# Patient Record
Sex: Female | Born: 1935 | Race: White | Hispanic: No | Smoking: Never smoker
Health system: Southern US, Community
[De-identification: ages and names within clinical notes are randomized; demographics above are authoritative.]

## PROBLEM LIST (undated history)

## (undated) DIAGNOSIS — S82209A Unspecified fracture of shaft of unspecified tibia, initial encounter for closed fracture: Secondary | ICD-10-CM

## (undated) DIAGNOSIS — I4510 Unspecified right bundle-branch block: Secondary | ICD-10-CM

## (undated) DIAGNOSIS — H3561 Retinal hemorrhage, right eye: Secondary | ICD-10-CM

## (undated) DIAGNOSIS — H269 Unspecified cataract: Secondary | ICD-10-CM

## (undated) DIAGNOSIS — N259 Disorder resulting from impaired renal tubular function, unspecified: Secondary | ICD-10-CM

## (undated) DIAGNOSIS — D510 Vitamin B12 deficiency anemia due to intrinsic factor deficiency: Secondary | ICD-10-CM

## (undated) DIAGNOSIS — E559 Vitamin D deficiency, unspecified: Secondary | ICD-10-CM

## (undated) DIAGNOSIS — E7800 Pure hypercholesterolemia, unspecified: Secondary | ICD-10-CM

## (undated) DIAGNOSIS — F03918 Unspecified dementia, unspecified severity, with other behavioral disturbance: Secondary | ICD-10-CM

## (undated) DIAGNOSIS — H9190 Unspecified hearing loss, unspecified ear: Secondary | ICD-10-CM

## (undated) DIAGNOSIS — K449 Diaphragmatic hernia without obstruction or gangrene: Secondary | ICD-10-CM

## (undated) DIAGNOSIS — K8050 Calculus of bile duct without cholangitis or cholecystitis without obstruction: Secondary | ICD-10-CM

## (undated) DIAGNOSIS — I872 Venous insufficiency (chronic) (peripheral): Secondary | ICD-10-CM

## (undated) DIAGNOSIS — I6350 Cerebral infarction due to unspecified occlusion or stenosis of unspecified cerebral artery: Secondary | ICD-10-CM

## (undated) DIAGNOSIS — N184 Chronic kidney disease, stage 4 (severe): Secondary | ICD-10-CM

## (undated) DIAGNOSIS — D631 Anemia in chronic kidney disease: Secondary | ICD-10-CM

## (undated) DIAGNOSIS — I10 Essential (primary) hypertension: Secondary | ICD-10-CM

## (undated) DIAGNOSIS — E875 Hyperkalemia: Secondary | ICD-10-CM

## (undated) DIAGNOSIS — F0391 Unspecified dementia with behavioral disturbance: Secondary | ICD-10-CM

## (undated) DIAGNOSIS — J309 Allergic rhinitis, unspecified: Secondary | ICD-10-CM

## (undated) DIAGNOSIS — Z7409 Other reduced mobility: Secondary | ICD-10-CM

## (undated) DIAGNOSIS — I679 Cerebrovascular disease, unspecified: Secondary | ICD-10-CM

## (undated) HISTORY — DX: Vitamin B12 deficiency anemia due to intrinsic factor deficiency: D51.0

## (undated) HISTORY — DX: Chronic kidney disease, stage 4 (severe): N18.4

## (undated) HISTORY — DX: Allergic rhinitis, unspecified: J30.9

## (undated) HISTORY — DX: Anemia in chronic kidney disease: N18.4

## (undated) HISTORY — PX: CAROTID ENDARTERECTOMY: SUR193

## (undated) HISTORY — DX: Venous insufficiency (chronic) (peripheral): I87.2

## (undated) HISTORY — DX: Cerebrovascular disease, unspecified: I67.9

## (undated) HISTORY — DX: Cerebral infarction due to unspecified occlusion or stenosis of unspecified cerebral artery: I63.50

## (undated) HISTORY — DX: Unspecified dementia, unspecified severity, with other behavioral disturbance: F03.918

## (undated) HISTORY — DX: Essential (primary) hypertension: I10

## (undated) HISTORY — DX: Unspecified hearing loss, unspecified ear: H91.90

## (undated) HISTORY — PX: CHOLECYSTECTOMY: SHX55

## (undated) HISTORY — DX: Vitamin D deficiency, unspecified: E55.9

## (undated) HISTORY — DX: Unspecified cataract: H26.9

## (undated) HISTORY — DX: Calculus of bile duct without cholangitis or cholecystitis without obstruction: K80.50

## (undated) HISTORY — DX: Anemia in chronic kidney disease: D63.1

## (undated) HISTORY — DX: Diaphragmatic hernia without obstruction or gangrene: K44.9

## (undated) HISTORY — DX: Unspecified fracture of shaft of unspecified tibia, initial encounter for closed fracture: S82.209A

## (undated) HISTORY — DX: Disorder resulting from impaired renal tubular function, unspecified: N25.9

## (undated) HISTORY — DX: Hyperkalemia: E87.5

## (undated) HISTORY — DX: Pure hypercholesterolemia, unspecified: E78.00

## (undated) HISTORY — DX: Retinal hemorrhage, right eye: H35.61

## (undated) HISTORY — DX: Unspecified dementia with behavioral disturbance: F03.91

---

## 2000-01-11 ENCOUNTER — Ambulatory Visit (HOSPITAL_COMMUNITY): Admission: RE | Admit: 2000-01-11 | Discharge: 2000-01-11 | Payer: Self-pay | Admitting: Pulmonary Disease

## 2000-01-11 ENCOUNTER — Encounter: Payer: Self-pay | Admitting: Pulmonary Disease

## 2003-07-15 ENCOUNTER — Emergency Department (HOSPITAL_COMMUNITY): Admission: EM | Admit: 2003-07-15 | Discharge: 2003-07-15 | Payer: Self-pay | Admitting: Emergency Medicine

## 2004-02-28 ENCOUNTER — Ambulatory Visit: Payer: Self-pay | Admitting: Pulmonary Disease

## 2004-03-28 ENCOUNTER — Ambulatory Visit: Payer: Self-pay | Admitting: Pulmonary Disease

## 2004-05-02 ENCOUNTER — Ambulatory Visit: Payer: Self-pay | Admitting: Pulmonary Disease

## 2004-05-30 ENCOUNTER — Ambulatory Visit: Payer: Self-pay | Admitting: Pulmonary Disease

## 2004-06-25 ENCOUNTER — Ambulatory Visit: Payer: Self-pay | Admitting: Pulmonary Disease

## 2004-06-28 ENCOUNTER — Ambulatory Visit: Payer: Self-pay | Admitting: Pulmonary Disease

## 2004-07-11 ENCOUNTER — Encounter (HOSPITAL_COMMUNITY): Admission: RE | Admit: 2004-07-11 | Discharge: 2004-07-13 | Payer: Self-pay | Admitting: Pulmonary Disease

## 2004-07-20 ENCOUNTER — Ambulatory Visit: Payer: Self-pay | Admitting: Pulmonary Disease

## 2004-07-30 ENCOUNTER — Ambulatory Visit: Payer: Self-pay | Admitting: Pulmonary Disease

## 2004-08-30 ENCOUNTER — Ambulatory Visit: Payer: Self-pay | Admitting: Pulmonary Disease

## 2004-10-01 ENCOUNTER — Ambulatory Visit: Payer: Self-pay | Admitting: Pulmonary Disease

## 2004-11-01 ENCOUNTER — Ambulatory Visit: Payer: Self-pay | Admitting: Pulmonary Disease

## 2004-12-06 ENCOUNTER — Ambulatory Visit: Payer: Self-pay | Admitting: Pulmonary Disease

## 2005-01-02 ENCOUNTER — Ambulatory Visit: Payer: Self-pay | Admitting: Pulmonary Disease

## 2005-01-30 ENCOUNTER — Ambulatory Visit: Payer: Self-pay | Admitting: Pulmonary Disease

## 2005-02-27 ENCOUNTER — Ambulatory Visit: Payer: Self-pay | Admitting: Pulmonary Disease

## 2005-04-03 ENCOUNTER — Ambulatory Visit: Payer: Self-pay | Admitting: Pulmonary Disease

## 2005-05-08 ENCOUNTER — Ambulatory Visit: Payer: Self-pay | Admitting: Pulmonary Disease

## 2005-06-05 ENCOUNTER — Ambulatory Visit: Payer: Self-pay | Admitting: Pulmonary Disease

## 2005-07-03 ENCOUNTER — Ambulatory Visit: Payer: Self-pay | Admitting: Pulmonary Disease

## 2005-08-07 ENCOUNTER — Ambulatory Visit: Payer: Self-pay | Admitting: Pulmonary Disease

## 2005-09-04 ENCOUNTER — Ambulatory Visit: Payer: Self-pay | Admitting: Pulmonary Disease

## 2005-10-02 ENCOUNTER — Ambulatory Visit: Payer: Self-pay | Admitting: Pulmonary Disease

## 2005-11-05 ENCOUNTER — Ambulatory Visit: Payer: Self-pay | Admitting: Pulmonary Disease

## 2005-12-02 ENCOUNTER — Ambulatory Visit: Payer: Self-pay | Admitting: Pulmonary Disease

## 2006-01-01 ENCOUNTER — Ambulatory Visit: Payer: Self-pay | Admitting: Pulmonary Disease

## 2006-01-29 ENCOUNTER — Ambulatory Visit: Payer: Self-pay | Admitting: Pulmonary Disease

## 2006-03-04 ENCOUNTER — Ambulatory Visit: Payer: Self-pay | Admitting: Pulmonary Disease

## 2006-04-03 ENCOUNTER — Ambulatory Visit: Payer: Self-pay | Admitting: Pulmonary Disease

## 2006-04-28 ENCOUNTER — Ambulatory Visit: Payer: Self-pay | Admitting: Pulmonary Disease

## 2006-06-03 ENCOUNTER — Ambulatory Visit: Payer: Self-pay | Admitting: Pulmonary Disease

## 2006-07-02 ENCOUNTER — Ambulatory Visit: Payer: Self-pay | Admitting: Pulmonary Disease

## 2006-07-02 LAB — CONVERTED CEMR LAB
ALT: 18 units/L (ref 0–40)
AST: 19 units/L (ref 0–37)
Albumin: 4 g/dL (ref 3.5–5.2)
Alkaline Phosphatase: 121 units/L — ABNORMAL HIGH (ref 39–117)
BUN: 30 mg/dL — ABNORMAL HIGH (ref 6–23)
Basophils Absolute: 0 10*3/uL (ref 0.0–0.1)
Basophils Relative: 0.4 % (ref 0.0–1.0)
CO2: 30 meq/L (ref 19–32)
Calcium: 9.5 mg/dL (ref 8.4–10.5)
Chloride: 110 meq/L (ref 96–112)
Cholesterol: 183 mg/dL (ref 0–200)
Creatinine, Ser: 1.5 mg/dL — ABNORMAL HIGH (ref 0.4–1.2)
Hemoglobin: 13.2 g/dL (ref 12.0–15.0)
Monocytes Absolute: 0.6 10*3/uL (ref 0.2–0.7)
Monocytes Relative: 7.3 % (ref 3.0–11.0)
RBC: 4.12 M/uL (ref 3.87–5.11)
RDW: 12.2 % (ref 11.5–14.6)
TSH: 0.96 microintl units/mL (ref 0.35–5.50)
Total Bilirubin: 1 mg/dL (ref 0.3–1.2)
Total CHOL/HDL Ratio: 3.4
Total Protein: 7 g/dL (ref 6.0–8.3)
Triglycerides: 205 mg/dL (ref 0–149)
VLDL: 41 mg/dL — ABNORMAL HIGH (ref 0–40)

## 2006-08-05 ENCOUNTER — Ambulatory Visit: Payer: Self-pay | Admitting: Pulmonary Disease

## 2006-09-02 ENCOUNTER — Ambulatory Visit: Payer: Self-pay | Admitting: Pulmonary Disease

## 2006-10-06 ENCOUNTER — Ambulatory Visit: Payer: Self-pay | Admitting: Pulmonary Disease

## 2006-11-03 ENCOUNTER — Ambulatory Visit: Payer: Self-pay | Admitting: Pulmonary Disease

## 2006-12-04 ENCOUNTER — Ambulatory Visit: Payer: Self-pay | Admitting: Pulmonary Disease

## 2007-01-05 ENCOUNTER — Ambulatory Visit: Payer: Self-pay | Admitting: Pulmonary Disease

## 2007-02-04 ENCOUNTER — Ambulatory Visit: Payer: Self-pay | Admitting: Pulmonary Disease

## 2007-02-06 DIAGNOSIS — K449 Diaphragmatic hernia without obstruction or gangrene: Secondary | ICD-10-CM | POA: Insufficient documentation

## 2007-02-06 DIAGNOSIS — Z8673 Personal history of transient ischemic attack (TIA), and cerebral infarction without residual deficits: Secondary | ICD-10-CM

## 2007-03-04 ENCOUNTER — Ambulatory Visit: Payer: Self-pay | Admitting: Pulmonary Disease

## 2007-04-09 ENCOUNTER — Ambulatory Visit: Payer: Self-pay | Admitting: Pulmonary Disease

## 2007-04-09 DIAGNOSIS — I872 Venous insufficiency (chronic) (peripheral): Secondary | ICD-10-CM

## 2007-04-09 LAB — CONVERTED CEMR LAB
BUN: 32 mg/dL — ABNORMAL HIGH (ref 6–23)
CO2: 28 meq/L (ref 19–32)
Calcium: 9.4 mg/dL (ref 8.4–10.5)
Creatinine, Ser: 1.6 mg/dL — ABNORMAL HIGH (ref 0.4–1.2)
Potassium: 3.8 meq/L (ref 3.5–5.1)
Pro B Natriuretic peptide (BNP): 43 pg/mL (ref 0.0–100.0)

## 2007-04-12 DIAGNOSIS — D510 Vitamin B12 deficiency anemia due to intrinsic factor deficiency: Secondary | ICD-10-CM

## 2007-04-12 DIAGNOSIS — E7800 Pure hypercholesterolemia, unspecified: Secondary | ICD-10-CM | POA: Insufficient documentation

## 2007-04-12 DIAGNOSIS — I679 Cerebrovascular disease, unspecified: Secondary | ICD-10-CM

## 2007-05-11 ENCOUNTER — Ambulatory Visit: Payer: Self-pay | Admitting: Pulmonary Disease

## 2007-06-10 ENCOUNTER — Ambulatory Visit: Payer: Self-pay | Admitting: Pulmonary Disease

## 2007-07-09 ENCOUNTER — Ambulatory Visit: Payer: Self-pay | Admitting: Pulmonary Disease

## 2007-08-10 ENCOUNTER — Ambulatory Visit: Payer: Self-pay | Admitting: Pulmonary Disease

## 2007-09-08 ENCOUNTER — Ambulatory Visit: Payer: Self-pay | Admitting: Pulmonary Disease

## 2007-10-08 ENCOUNTER — Ambulatory Visit: Payer: Self-pay | Admitting: Pulmonary Disease

## 2007-10-08 DIAGNOSIS — N184 Chronic kidney disease, stage 4 (severe): Secondary | ICD-10-CM | POA: Insufficient documentation

## 2007-10-08 LAB — CONVERTED CEMR LAB
Alkaline Phosphatase: 131 units/L — ABNORMAL HIGH (ref 39–117)
Basophils Absolute: 0.1 10*3/uL (ref 0.0–0.1)
Bilirubin, Direct: 0.1 mg/dL (ref 0.0–0.3)
Calcium: 9.4 mg/dL (ref 8.4–10.5)
Cholesterol: 190 mg/dL (ref 0–200)
GFR calc Af Amer: 46 mL/min
Glucose, Bld: 116 mg/dL — ABNORMAL HIGH (ref 70–99)
HCT: 35.9 % — ABNORMAL LOW (ref 36.0–46.0)
Hemoglobin: 12.7 g/dL (ref 12.0–15.0)
Lymphocytes Relative: 21.3 % (ref 12.0–46.0)
MCHC: 35.3 g/dL (ref 30.0–36.0)
Monocytes Absolute: 0.4 10*3/uL (ref 0.1–1.0)
Monocytes Relative: 5.8 % (ref 3.0–12.0)
Neutro Abs: 5.3 10*3/uL (ref 1.4–7.7)
Platelets: 191 10*3/uL (ref 150–400)
Potassium: 4.6 meq/L (ref 3.5–5.1)
RDW: 12.9 % (ref 11.5–14.6)
Sodium: 143 meq/L (ref 135–145)
TSH: 1.26 microintl units/mL (ref 0.35–5.50)
Total Bilirubin: 0.7 mg/dL (ref 0.3–1.2)
Total CHOL/HDL Ratio: 4.4
Triglycerides: 314 mg/dL (ref 0–149)
VLDL: 63 mg/dL — ABNORMAL HIGH (ref 0–40)

## 2007-11-06 ENCOUNTER — Ambulatory Visit: Payer: Self-pay | Admitting: Pulmonary Disease

## 2007-12-08 ENCOUNTER — Ambulatory Visit: Payer: Self-pay | Admitting: Pulmonary Disease

## 2007-12-10 LAB — CONVERTED CEMR LAB: Vit D, 1,25-Dihydroxy: 26 — ABNORMAL LOW (ref 30–89)

## 2008-01-07 ENCOUNTER — Ambulatory Visit: Payer: Self-pay | Admitting: Pulmonary Disease

## 2008-02-05 ENCOUNTER — Ambulatory Visit: Payer: Self-pay | Admitting: Pulmonary Disease

## 2008-03-07 ENCOUNTER — Ambulatory Visit: Payer: Self-pay | Admitting: Pulmonary Disease

## 2008-04-05 ENCOUNTER — Ambulatory Visit: Payer: Self-pay | Admitting: Pulmonary Disease

## 2008-05-06 ENCOUNTER — Ambulatory Visit: Payer: Self-pay | Admitting: Pulmonary Disease

## 2008-06-06 ENCOUNTER — Ambulatory Visit: Payer: Self-pay | Admitting: Pulmonary Disease

## 2008-07-04 ENCOUNTER — Ambulatory Visit: Payer: Self-pay | Admitting: Pulmonary Disease

## 2008-07-22 DIAGNOSIS — E559 Vitamin D deficiency, unspecified: Secondary | ICD-10-CM | POA: Insufficient documentation

## 2008-07-22 DIAGNOSIS — F0390 Unspecified dementia without behavioral disturbance: Secondary | ICD-10-CM

## 2008-07-22 DIAGNOSIS — H9113 Presbycusis, bilateral: Secondary | ICD-10-CM | POA: Insufficient documentation

## 2008-08-04 ENCOUNTER — Ambulatory Visit: Payer: Self-pay | Admitting: Pulmonary Disease

## 2008-08-08 DIAGNOSIS — D649 Anemia, unspecified: Secondary | ICD-10-CM

## 2008-09-02 ENCOUNTER — Ambulatory Visit: Payer: Self-pay | Admitting: Pulmonary Disease

## 2008-10-04 ENCOUNTER — Ambulatory Visit: Payer: Self-pay | Admitting: Pulmonary Disease

## 2008-10-08 DIAGNOSIS — J309 Allergic rhinitis, unspecified: Secondary | ICD-10-CM | POA: Insufficient documentation

## 2008-10-08 LAB — CONVERTED CEMR LAB
AST: 24 units/L (ref 0–37)
BUN: 36 mg/dL — ABNORMAL HIGH (ref 6–23)
Basophils Absolute: 0 10*3/uL (ref 0.0–0.1)
Bilirubin, Direct: 0.1 mg/dL (ref 0.0–0.3)
Calcium: 9.6 mg/dL (ref 8.4–10.5)
Cholesterol: 184 mg/dL (ref 0–200)
Creatinine, Ser: 1.5 mg/dL — ABNORMAL HIGH (ref 0.4–1.2)
GFR calc non Af Amer: 34.97 mL/min (ref >60)
Glucose, Bld: 112 mg/dL — ABNORMAL HIGH (ref 70–99)
HCT: 33.5 % — ABNORMAL LOW (ref 36.0–46.0)
HDL: 49.5 mg/dL (ref >39.00)
Iron: 95 ug/dL (ref 42–145)
Lymphs Abs: 1.4 10*3/uL (ref 0.7–4.0)
Monocytes Relative: 6.9 % (ref 3.0–12.0)
Neutrophils Relative %: 69.7 % (ref 43.0–77.0)
Platelets: 155 10*3/uL (ref 150.0–400.0)
RDW: 12.3 % (ref 11.5–14.6)
Saturation Ratios: 34.7 % (ref 20.0–50.0)
TSH: 1.62 microintl units/mL (ref 0.35–5.50)
Total Bilirubin: 0.6 mg/dL (ref 0.3–1.2)
Transferrin: 195.7 mg/dL — ABNORMAL LOW (ref 212.0–360.0)
Triglycerides: 279 mg/dL — ABNORMAL HIGH (ref 0.0–149.0)
VLDL: 55.8 mg/dL — ABNORMAL HIGH (ref 0.0–40.0)
Vit D, 25-Hydroxy: 24 ng/mL — ABNORMAL LOW (ref 30–89)
Vitamin B-12: >1500 pg/mL — ABNORMAL HIGH (ref 211–911)
WBC: 6.5 10*3/uL (ref 4.5–10.5)

## 2008-11-07 ENCOUNTER — Ambulatory Visit: Payer: Self-pay | Admitting: Pulmonary Disease

## 2008-11-08 ENCOUNTER — Telehealth: Payer: Self-pay | Admitting: Pulmonary Disease

## 2008-12-07 ENCOUNTER — Ambulatory Visit: Payer: Self-pay | Admitting: Pulmonary Disease

## 2009-01-09 ENCOUNTER — Ambulatory Visit: Payer: Self-pay | Admitting: Pulmonary Disease

## 2009-02-08 ENCOUNTER — Ambulatory Visit: Payer: Self-pay | Admitting: Pulmonary Disease

## 2009-02-10 ENCOUNTER — Ambulatory Visit: Payer: Self-pay | Admitting: Pulmonary Disease

## 2009-03-10 ENCOUNTER — Ambulatory Visit: Payer: Self-pay | Admitting: Pulmonary Disease

## 2009-04-17 ENCOUNTER — Ambulatory Visit: Payer: Self-pay | Admitting: Pulmonary Disease

## 2009-04-18 LAB — CONVERTED CEMR LAB
Basophils Absolute: 0.1 10*3/uL (ref 0.0–0.1)
Calcium: 9.5 mg/dL (ref 8.4–10.5)
GFR calc non Af Amer: 37.82 mL/min (ref >60)
HCT: 36 % (ref 36.0–46.0)
Lymphs Abs: 1.9 10*3/uL (ref 0.7–4.0)
MCV: 98.7 fL (ref 78.0–100.0)
Monocytes Absolute: 0.5 10*3/uL (ref 0.1–1.0)
Platelets: 168 10*3/uL (ref 150.0–400.0)
RDW: 11.9 % (ref 11.5–14.6)
Sodium: 144 meq/L (ref 135–145)

## 2009-05-18 ENCOUNTER — Ambulatory Visit: Payer: Self-pay | Admitting: Pulmonary Disease

## 2009-06-19 ENCOUNTER — Ambulatory Visit: Payer: Self-pay | Admitting: Pulmonary Disease

## 2009-07-17 ENCOUNTER — Ambulatory Visit: Payer: Self-pay | Admitting: Pulmonary Disease

## 2009-08-16 ENCOUNTER — Ambulatory Visit: Payer: Self-pay | Admitting: Pulmonary Disease

## 2009-08-21 ENCOUNTER — Ambulatory Visit: Payer: Self-pay | Admitting: Pulmonary Disease

## 2009-08-21 ENCOUNTER — Telehealth (INDEPENDENT_AMBULATORY_CARE_PROVIDER_SITE_OTHER): Payer: Self-pay | Admitting: *Deleted

## 2009-08-22 LAB — CONVERTED CEMR LAB
Basophils Relative: 0.4 % (ref 0.0–3.0)
Chloride: 108 meq/L (ref 96–112)
Eosinophils Relative: 0.5 % (ref 0.0–5.0)
HCT: 35.1 % — ABNORMAL LOW (ref 36.0–46.0)
Lymphs Abs: 1.7 10*3/uL (ref 0.7–4.0)
MCV: 95.3 fL (ref 78.0–100.0)
Monocytes Absolute: 0.6 10*3/uL (ref 0.1–1.0)
Neutrophils Relative %: 70.1 % (ref 43.0–77.0)
Potassium: 4.8 meq/L (ref 3.5–5.1)
RBC: 3.68 M/uL — ABNORMAL LOW (ref 3.87–5.11)
Sodium: 145 meq/L (ref 135–145)
WBC: 7.8 10*3/uL (ref 4.5–10.5)

## 2009-08-30 ENCOUNTER — Telehealth: Payer: Self-pay | Admitting: Pulmonary Disease

## 2009-08-31 ENCOUNTER — Telehealth (INDEPENDENT_AMBULATORY_CARE_PROVIDER_SITE_OTHER): Payer: Self-pay | Admitting: *Deleted

## 2009-09-15 ENCOUNTER — Ambulatory Visit: Payer: Self-pay | Admitting: Pulmonary Disease

## 2009-10-16 ENCOUNTER — Ambulatory Visit: Payer: Self-pay | Admitting: Pulmonary Disease

## 2009-10-18 LAB — CONVERTED CEMR LAB
ALT: 22 units/L (ref 0–35)
AST: 21 units/L (ref 0–37)
Albumin: 4.1 g/dL (ref 3.5–5.2)
BUN: 40 mg/dL — ABNORMAL HIGH (ref 6–23)
Cholesterol: 180 mg/dL (ref 0–200)
Eosinophils Relative: 0.4 % (ref 0.0–5.0)
GFR calc non Af Amer: 32.86 mL/min (ref >60)
Glucose, Bld: 98 mg/dL (ref 70–99)
HCT: 36.1 % (ref 36.0–46.0)
HDL: 49 mg/dL (ref >39.00)
Lymphocytes Relative: 19.8 % (ref 12.0–46.0)
Monocytes Relative: 7.1 % (ref 3.0–12.0)
Neutrophils Relative %: 72.2 % (ref 43.0–77.0)
Platelets: 188 10*3/uL (ref 150.0–400.0)
Potassium: 5.3 meq/L — ABNORMAL HIGH (ref 3.5–5.1)
Total CHOL/HDL Ratio: 4
Total Protein: 6.9 g/dL (ref 6.0–8.3)
Triglycerides: 277 mg/dL — ABNORMAL HIGH (ref 0.0–149.0)
VLDL: 55.4 mg/dL — ABNORMAL HIGH (ref 0.0–40.0)
WBC: 7.3 10*3/uL (ref 4.5–10.5)

## 2009-10-20 ENCOUNTER — Telehealth (INDEPENDENT_AMBULATORY_CARE_PROVIDER_SITE_OTHER): Payer: Self-pay | Admitting: *Deleted

## 2009-11-15 ENCOUNTER — Ambulatory Visit: Payer: Self-pay | Admitting: Pulmonary Disease

## 2009-11-27 ENCOUNTER — Telehealth: Payer: Self-pay | Admitting: Pulmonary Disease

## 2009-11-28 ENCOUNTER — Ambulatory Visit: Payer: Self-pay | Admitting: Pulmonary Disease

## 2009-12-14 ENCOUNTER — Ambulatory Visit: Payer: Self-pay | Admitting: Pulmonary Disease

## 2009-12-18 ENCOUNTER — Encounter: Payer: Self-pay | Admitting: Pulmonary Disease

## 2010-01-16 ENCOUNTER — Ambulatory Visit: Payer: Self-pay | Admitting: Pulmonary Disease

## 2010-02-15 ENCOUNTER — Ambulatory Visit: Payer: Self-pay | Admitting: Pulmonary Disease

## 2010-03-19 ENCOUNTER — Ambulatory Visit: Payer: Self-pay | Admitting: Internal Medicine

## 2010-04-16 ENCOUNTER — Ambulatory Visit: Payer: Self-pay | Admitting: Pulmonary Disease

## 2010-04-23 LAB — CONVERTED CEMR LAB
ALT: 24 units/L (ref 0–35)
AST: 26 units/L (ref 0–37)
Albumin: 4 g/dL (ref 3.5–5.2)
Alkaline Phosphatase: 135 units/L — ABNORMAL HIGH (ref 39–117)
BUN: 41 mg/dL — ABNORMAL HIGH (ref 6–23)
Basophils Absolute: 0 10*3/uL (ref 0.0–0.1)
Chloride: 109 meq/L (ref 96–112)
Cholesterol: 168 mg/dL (ref 0–200)
Eosinophils Relative: 1 % (ref 0.0–5.0)
Glucose, Bld: 108 mg/dL — ABNORMAL HIGH (ref 70–99)
Iron: 106 ug/dL (ref 42–145)
LDL Cholesterol: 81 mg/dL (ref 0–99)
MCV: 96.2 fL (ref 78.0–100.0)
Monocytes Absolute: 0.4 10*3/uL (ref 0.1–1.0)
Monocytes Relative: 6.6 % (ref 3.0–12.0)
Neutrophils Relative %: 68.6 % (ref 43.0–77.0)
Platelets: 186 10*3/uL (ref 150.0–400.0)
Potassium: 4.8 meq/L (ref 3.5–5.1)
RDW: 13 % (ref 11.5–14.6)
Saturation Ratios: 34.5 % (ref 20.0–50.0)
Sodium: 145 meq/L (ref 135–145)
Transferrin: 219.2 mg/dL (ref 212.0–360.0)
Triglycerides: 184 mg/dL — ABNORMAL HIGH (ref 0.0–149.0)
WBC: 6.7 10*3/uL (ref 4.5–10.5)

## 2010-05-21 ENCOUNTER — Ambulatory Visit
Admission: RE | Admit: 2010-05-21 | Discharge: 2010-05-21 | Payer: Self-pay | Source: Home / Self Care | Attending: Pulmonary Disease | Admitting: Pulmonary Disease

## 2010-05-21 ENCOUNTER — Telehealth: Payer: Self-pay | Admitting: Pulmonary Disease

## 2010-05-29 NOTE — Progress Notes (Signed)
Summary: lift chair  Phone Note From Other Clinic Call back at [phone]   Caller: Patient Caller: Narda Amber apoth/ serita  Call For: nadel Summary of Call: calling about diag for lift chair Initial call taken by: Gustavus Bryant,  Aug 31, 2009 9:47 AM  Follow-up for Phone Call        [facility] calling to see if the patient has any history of arthritis or osteoarthritis. They are not sure MCR will approve the lift chair for history of stroke. I do not see any diagnosis for arthritis but wanted to double check with SN. Please advise Francesca Jewett CMA  Aug 31, 2009 9:59 AM  Additional Follow-up for Phone Call Additional follow up Details #1::        per SN---pt has osteoarthritis and DJD---dx code 715.90.  thanks Armstrong  Aug 31, 2009 2:28 PM     Additional Follow-up for Phone Call Additional follow up Details #2::    Spoke with Donah Driver and advised that pt does have DJD and osteoarthritis 715.90 Follow-up by: Tilden Dome,  Aug 31, 2009 2:31 PM

## 2010-05-29 NOTE — Assessment & Plan Note (Signed)
Summary: B12 ///KP  Nurse Visit   Allergies: 1)  ! Novocain (Procaine Hcl)  Medication Administration  Injection # 1:    Medication: Vit B12 1000 mcg    Diagnosis: UNSPECIFIED ANEMIA (ICD-285.9)    Route: SQ    Site: L deltoid    Exp Date: 12/30/2010    Lot #: [id number]    Mfr: American Regent    Comments: B- 12 SHOT IN LEFT ARM    Patient tolerated injection without complications    Given by: [facility]  Orders Added: 1)  Vit B12 1000 mcg [J3420] 2)  Admin of Therapeutic Inj  intramuscular or subcutaneous [96372]   Medication Administration  Injection # 1:    Medication: Vit B12 1000 mcg    Diagnosis: UNSPECIFIED ANEMIA (ICD-285.9)    Route: SQ    Site: L deltoid    Exp Date: 12/30/2010    Lot #: [id number]    Mfr: American Regent    Comments: B- 12 SHOT IN LEFT ARM    Patient tolerated injection without complications    Given by: [facility]  Orders Added: 1)  Vit B12 1000 mcg [J3420] 2)  Admin of Therapeutic Inj  intramuscular or subcutaneous [id number]

## 2010-05-29 NOTE — Assessment & Plan Note (Signed)
Summary: B12/APC  Nurse Visit   Allergies: 1)  ! Novocain (Procaine Hcl)  Medication Administration  Injection # 1:    Medication: Vit B12 1000 mcg    Diagnosis: UNSPECIFIED ANEMIA (ICD-285.9)    Route: IM    Site: R deltoid    Exp Date: 05/31/2011    Lot #: [id number]    Mfr: American Regent    Patient tolerated injection without complications    Given by: Verdie Mosher, CMA(January 16, 2010 11:18 AM)  Orders Added: 1)  Vit B12 1000 mcg [J3420] 2)  Admin of Therapeutic Inj  intramuscular or subcutaneous [96372]   Medication Administration  Injection # 1:    Medication: Vit B12 1000 mcg    Diagnosis: UNSPECIFIED ANEMIA (ICD-285.9)    Route: IM    Site: R deltoid    Exp Date: 05/31/2011    Lot #: [id number]    Mfr: [place]    Patient tolerated injection without complications    Given by: Verdie Mosher, CMA(January 16, 2010 11:18 AM)  Orders Added: 1)  Vit B12 1000 mcg [J3420] 2)  Admin of Therapeutic Inj  intramuscular or subcutaneous [96372]  Appended Document: B12/APC Flu Vaccine Consent Questions     Do you have a history of severe allergic reactions to this vaccine? no    Any prior history of allergic reactions to egg and/or gelatin? no    Do you have a sensitivity to the preservative Thimersol? no    Do you have a past history of Guillan-Barre Syndrome? no    Do you currently have an acute febrile illness? no    Have you ever had a severe reaction to latex? no    Vaccine information given and explained to patient? yes    Are you currently pregnant? no    Lot Number:[id number]   Exp Date:10/27/2010   Site Given  Left Deltoid IM   Alroy Bailiff  January 23, 2010 9:23 AM    Clinical Lists Changes  Orders: Added new Service order of Flu Vaccine 55yrs + MEDICARE PATIENTS [id number]) - Signed Added new Service order of Administration Flu vaccine - MCR [id number]) - Signed Observations: Added new observation of FLU VAX VIS: 11/21/2009 version (01/23/2010  9:20) Added new observation of FLU VAXLOT: [id number] (01/23/2010 9:20) Added new observation of FLU VAXMFR: [employer] (01/23/2010 9:20) Added new observation of FLU VAX EXP: 10/27/2010 (01/23/2010 9:20) Added new observation of FLU VAX DSE: 0.11ml (01/23/2010 9:20) Added new observation of FLU VAX: Fluvax 3+ (01/23/2010 9:20)

## 2010-05-29 NOTE — Assessment & Plan Note (Signed)
Summary: B12 ///KP  Nurse Visit   Allergies: 1)  ! Novocain (Procaine Hcl)  Medication Administration  Injection # 1:    Medication: Vit B12 1000 mcg    Diagnosis: UNSPECIFIED ANEMIA (ICD-285.9)    Route: SQ    Site: L deltoid    Exp Date: 12/31/2010    Lot #: [id number]    Mfr: American Regent    Comments: B-12 1000 MCG      Patient tolerated injection without complications    Given by: [facility]  Orders Added: 1)  Vit B12 1000 mcg [J3420] 2)  Admin of Therapeutic Inj  intramuscular or subcutaneous [96372]   Medication Administration  Injection # 1:    Medication: Vit B12 1000 mcg    Diagnosis: UNSPECIFIED ANEMIA (ICD-285.9)    Route: SQ    Site: L deltoid    Exp Date: 12/31/2010    Lot #: [id number]    Mfr: American Regent    Comments: B-12 1000 MCG      Patient tolerated injection without complications    Given by: [facility]  Orders Added: 1)  Vit B12 1000 mcg [J3420] 2)  Admin of Therapeutic Inj  intramuscular or subcutaneous [id number]

## 2010-05-29 NOTE — Assessment & Plan Note (Signed)
Summary: B12 ///kp  Nurse Visit   Allergies: 1)  ! Novocain (Procaine Hcl)  Medication Administration  Injection # 1:    Medication: Vit B12 1000 mcg    Diagnosis: UNSPECIFIED ANEMIA (ICD-285.9)    Route: IM    Site: L deltoid    Exp Date: 12/2010    Lot #: [id number]    Mfr: American Regent    Comments: Injection given by Brock Bad, CMA in allergy lab.    Patient tolerated injection without complications  Orders Added: 1)  Admin of Therapeutic Inj  intramuscular or subcutaneous [96372] 2)  Vit B12 1000 mcg [id number]

## 2010-05-29 NOTE — Assessment & Plan Note (Signed)
Summary: rov 6 months///kp   CC:  6 month ROV & review of mult medical problems....  History of Present Illness: 77 y/o WF here for a follow up visit... she has mult med problems as noted below... she states that she is feeling well and (as always) she is in good spirits-    ~  Jun10:  she states that she has had a good 6 months- no new complaints or concerns... she has seen a podiatrist to have her nails trimmed and get new shoes... we discussed f/u labs today and we will give her a Pneumovax & Tetanus shot...  ~  Dec10:  she reports another good 28mo & confirmed by daugh... she has new glasses & was told the beginnings of cataracts by her optometrist, DrMartin... she had the 2010 Flu vaccine in Oct...    ~  October 16, 2009:  she fell 4/11 w/ ankle sprain- no fx... now has lift chair & she is very happy... no new complaints or concerns- feels well, denies CP/ palpit/ SOB/ ch in edema/ TIA or stroke manifestations/ etc... she remains on ASA & Ticlid per her request (doesn't want to change meds);  and she is content to continue her other Rx the same as well- Lasix, Lipitor, Zantac, Vit d & B12 shots...    Current Problem List:  ALLERGIC RHINITIS (ICD-477.9) - she uses Claritin & Flonase as needed... she has 2 cats- Heidi & Aline Brochure!  HEARING LOSS (ICD-389.9) - she doesn't have hearing aides- "it wouldn't work"...  DENTAL CARIES (ICD-521.00) - she has been repeatedly reminded to seek dental help, but refuses... family purees her foods...  CEREBROVASCULAR DISEASE (ICD-437.9) - s/p right CAE by [facility] 1992... stable on ASA 81mg /d and TICLID 250mg Bid... she refuses repeat/ f/u CDopplers, etc... she has been doing well and remains asymptomatic- denies focal weakness, numbness, paresthesia, pain, slurred speech, headache, dizziness, tremor, or seizure.  VENOUS INSUFFICIENCY, CHRONIC (ICD-459.81) - she has chronic LE edema- on LASIX 80mg /d and low sodium diet... she has gained 23# to 152# in 2009 w/  Ensure & puree diet from famiy, now down a few to 149#... advises on low sodium, elevate legs, support hose...  ~  6/11:  BUN=40, Creat=1.6 on the Lasix80 & asked to decr to 40mg /d and restrict sodium.  HYPERCHOLESTEROLEMIA (ICD-272.0) - on diet + LIPITOR 10mg /d... her TG's have been elevated but she refuses more meds.  ~  labs 3/08 showed TChol 183, TG 205, HDL 54, LDL 98  ~  labs 6/09 showed TChol 190, TG 314, HDL 43, LDL 94... she refuses more meds, rec low fat diet.  ~  labs 6/10 showed TChol 184, TG 279, HDL 50, LDL 97  ~  labs 6/11 showed TChol 180, TG 277, HDL 49, LDL 95  HIATAL HERNIA (ICD-553.3) - large HH seen on prev CT Abd in 2001... takes ZANTAC 75mg  Qhs...  RENAL INSUFFICIENCY (ICD-588.9) - Creat= 1.5 to 1.7 in 2008...  ~  labs 6/09 showed BUN= 33, Creat= 1.4  ~  labs 6/10 showed BUN= 36, Creat= 1.5  ~  labs 12/10 showed BUN= 33, Creat= 1.4  ~  labs 6/11 showed BUN=40, Creat=1.6.Marland KitchenMarland Kitchen rec to decr Lasix from 80 to 40mg /d.  UTI'S, HX OF (ICD-V13.00)  VITAMIN D DEFICIENCY (ICD-268.9) - she is likely osteoporotic but unmeasured due to refusal of BMD & Bisphos therapy options...  ~  labs 6/09 showed Vit D level = 26... rec to take OTC Vit D 1000 u daily...  ~  labs 6/10 showed Vit D level = 24... rec to incr Vit D to 2000 u daily.  Hx of STROKE (ICD-434.91) - hx right brain stroke in 1992 w/ CTBrain showing right post temporal & parietal low density area w/ encephalomalacia...  SENILE DEMENTIA (ICD-290.0)  PERNICIOUS ANEMIA (ICD-281.0) - on monthly B12 shots for years... in 2006 she was also iron deficient and received IV iron therapy (she refused GI eval)... she feels well and denies weakness, fatigue, bleeding, dark stools, etc...  ~  labs 3/08 showed Hg= 13.2  ~  labs 6/09 showed Hg= 12.7  ~  labs 6/10 showed Hg= 11.7... Fe, B12, Folate were all WNL.  ~  labs 12/10 showed Hg= 12.4  NOTE*** She has repeatedly refused GYN exams, PAP smears, Mammograms, &  Colonoscopy...   Preventive Screening-Counseling & Management  Alcohol-Tobacco     Smoking Status: never  Allergies: 1)  ! Novocain (Procaine Hcl)  Comments:  Nurse/Medical Assistant: The patient's medications and allergies were reviewed with the patient and were updated in the Medication and Allergy Lists.  Past History:  Past Medical History: ALLERGIC RHINITIS (ICD-477.9) HEARING LOSS (ICD-389.9) DENTAL CARIES (ICD-521.00) CEREBROVASCULAR DISEASE (ICD-437.9) VENOUS INSUFFICIENCY, CHRONIC (ICD-459.81) HYPERCHOLESTEROLEMIA (ICD-272.0) HIATAL HERNIA (ICD-553.3) RENAL INSUFFICIENCY (ICD-588.9) UTI'S, HX OF (ICD-V13.00) VITAMIN D DEFICIENCY (ICD-268.9) Hx of STROKE (ICD-434.91) SENILE DEMENTIA (ICD-290.0) PERNICIOUS ANEMIA (ICD-281.0)  Past Surgical History: S/P cholecystectomy - 1984 by DrBallen S/P Rt Carotid Endarterectormy by Sheryn Bison in 1992  Family History: Reviewed history from 10/08/2007 and no changes required. Father died from an MI Mother died from Valvular Heart Disease 6 Sibling - 3 Bro & 3 Sis  Social History: Reviewed history from 10/08/2007 and no changes required. Widow 2 Children  Review of Systems      See HPI       The patient complains of dyspnea on exertion, peripheral edema, and difficulty walking.  The patient denies anorexia, fever, weight loss, weight gain, vision loss, decreased hearing, hoarseness, chest pain, syncope, prolonged cough, headaches, hemoptysis, abdominal pain, melena, hematochezia, severe indigestion/heartburn, hematuria, incontinence, muscle weakness, suspicious skin lesions, transient blindness, depression, unusual weight change, abnormal bleeding, enlarged lymph nodes, and angioedema.    Vital Signs:  Patient profile:   77 year old female Height:      64 inches Weight:      147 pounds BMI:     25.32 O2 Sat:      96 % on Room air Temp:     98.8 degrees F oral Pulse rate:   67 / minute BP sitting:   110 / 76  (left  arm) Cuff size:   regular  Vitals Entered By: Elita Boone CMA (October 16, 2009 8:58 AM)  O2 Sat at Rest %:  96 O2 Flow:  Room air CC: 6 month ROV & review of mult medical problems... Is Patient Diabetic? No Pain Assessment Patient in pain? no      Comments no changes in meds today   Physical Exam  Additional Exam:  WD WN 77 y/o WF in NAD... GENERAL:  Alert & oriented x 2;  pleasant & cooperative... HEENT:  [place]/AT, EOM-wnl, PERRLA, Fundi-benign, EACs-clear, TMs-wnl, NOSE-clear, THROAT-clear but dentition is very poor. NECK:  Supple w/ adeq ROM; no JVD; carotid impulses OK w/o bruits, +CAE scar on Rt; no thyromegaly or nodules palpated; no lymphadenopathy. CHEST:  Clear to P & A; without wheezes/ rales/ or rhonchi. HEART:  Regular Rhythm; without murmurs/ rubs/ or gallops. ABDOMEN:  Soft & nontender; normal bowel  sounds; no organomegaly or masses detected. EXT: without deformities or arthritic changes; she has VI changes w/ 2+edema on Lft & 1+edema on Rt NEURO:  CN's intact;  no focal neuro deficits... DERM:  No lesions noted; no rash etc...    MISC. Report  Procedure date:  10/16/2009  Findings:      BMP (METABOL)   Sodium                    145 mEq/L                   135-145   Potassium            [H]  5.3 mEq/L                   3.5-5.1   Chloride                  111 mEq/L                   96-112   Carbon Dioxide            26 mEq/L                    19-32   Glucose                   98 mg/dL                    70-99   BUN                  [H]  40 mg/dL                    6-23   Creatinine           [H]  1.6 mg/dL                   0.4-1.2   Calcium                   9.5 mg/dL                   8.4-10.5   GFR                       32.86 mL/min                >60  Hepatic/Liver Function Panel (HEPATIC)   Total Bilirubin           0.4 mg/dL                   0.3-1.2   Direct Bilirubin          0.1 mg/dL                   0.0-0.3   Alkaline Phosphatase [H]  129 U/L                      39-117   AST                       21 U/L                      0-37   ALT  22 U/L                      0-35   Total Protein             6.9 g/dL                    6.0-8.3   Albumin                   4.1 g/dL                    3.5-5.2  CBC Platelet w/Diff (CBCD)   White Cell Count          7.3 K/uL                    4.5-10.5   Red Cell Count       [L]  3.74 Mil/uL                 3.87-5.11   Hemoglobin                12.2 g/dL                   12.0-15.0   Hematocrit                36.1 %                      36.0-46.0   MCV                       96.6 fl                     78.0-100.0   Platelet Count            188.0 K/uL                  150.0-400.0   Neutrophil %              72.2 %                      43.0-77.0   Lymphocyte %              19.8 %                      12.0-46.0   Monocyte %                7.1 %                       3.0-12.0   Eosinophils%              0.4 %                       0.0-5.0   Basophils %               0.5 %                       0.0-3.0  Comments:      Lipid Panel (LIPID)   Cholesterol               180 mg/dL                   0-200  Triglycerides        [H]  277.0 mg/dL                 0.0-149.0   HDL                       49.00 mg/dL                 >39.00  Cholesterol LDL - Direct                             94.8 mg/dL   TSH (TSH)   FastTSH                   1.34 uIU/mL                 0.35-5.50   IBC Panel (IBC)   Iron                      88 ug/dL                    42-145   Transferrin          [L]  202.9 mg/dL                 212.0-360.0   Iron Saturation           31.0 %                      20.0-50.0   Impression & Recommendations:  Problem # 1:  CEREBROVASCULAR DISEASE (ICD-437.9) She remain on ASA/ Ticlid & stable> she doesn't want to switch meds.  Problem # 2:  VENOUS INSUFFICIENCY, CHRONIC (ICD-459.81) She has chr VI & edema on Lasix80 but BUN/ Creat are up & needs to decr the Lasix to  40mg /d & limit sodium better.  Problem # 3:  HYPERCHOLESTEROLEMIA (ICD-272.0) Chol & LDL are OK on the Lipitor but TG still up & needs better low fat diet, she refuses additional meds like fibrate. Her updated medication list for this problem includes:    Lipitor 10 Mg Tabs (Atorvastatin calcium) ..... Once daily  Orders: TLB-BMP (Basic Metabolic Panel-BMET) (99991111) TLB-Hepatic/Liver Function Pnl (80076-HEPATIC) TLB-CBC Platelet - w/Differential (85025-CBCD) TLB-Lipid Panel (80061-LIPID) TLB-TSH (Thyroid Stimulating Hormone) (84443-TSH) TLB-IBC Pnl (Iron/FE;Transferrin) (83550-IBC)  Problem # 4:  RENAL INSUFFICIENCY (ICD-588.9) BUN/ Creat are up & needs to decr Lasix from 80 to 40... rec> no salt/ sodium! elevate legs wear support hose.  Problem # 5:  Hx of STROKE (ICD-434.91) She will continue on ASA, ticlid as noted (her pref)... Her updated medication list for this problem includes:    Adult Aspirin Low Strength 81 Mg Tbdp (Aspirin) ..... Once daily    Ticlid 250 Mg Tabs (Ticlopidine hcl) ..... One tab twice daily  Problem # 6:  PERNICIOUS ANEMIA (ICD-281.0) Fe level is good, continue B12 shots monthly... Her updated medication list for this problem includes:    Ferrex 150 150 Mg Caps (Polysaccharide iron complex) .Marland Kitchen... Take 1 tablet by mouth once a day  Problem # 7:  SENILE DEMENTIA (ICD-290.0) Stable>  no chaqnge & wellcared for by family.  Complete Medication List: 1)  Claritin 10 Mg Tabs (Loratadine) .Marland Kitchen.. 1 tab daily as needed for allergy symptoms.Marland KitchenMarland Kitchen 2)  Flonase 50 Mcg/act Susp (Fluticasone propionate) .Marland Kitchen.. 1-2 sprays in each nostril at bedtime as needed 3)  Adult Aspirin Low Strength 81 Mg Tbdp (Aspirin) .... Once daily 4)  Ticlid 250 Mg Tabs (Ticlopidine hcl) .... One tab twice daily 5)  Lasix 40 Mg Tabs (Furosemide) .... Two tablets every morning 6)  Lipitor 10 Mg Tabs (Atorvastatin calcium) .... Once daily 7)  Zantac 75 75 Mg Tabs (Ranitidine hcl) .Marland Kitchen.. 1 tab  by mouth at bedtime.Marland KitchenMarland Kitchen 8)  Multivitamins Tabs (Multiple vitamin) 9)  Vitamin D3 1000 Unit Caps (Cholecalciferol) .... Take 1 cap by mouth once daily... 10)  Ferrex 150 150 Mg Caps (Polysaccharide iron complex) .... Take 1 tablet by mouth once a day  Other Orders: Vit B12 1000 mcg (J3420) Admin of Therapeutic Inj  intramuscular or subcutaneous [id number])  Patient Instructions: 1)  Today we updated your med list- see below.... 2)  We refilled your meds today... 3)  Today we also did your follow up FASTING blood work... please call the "phone tree" in a few days for your lab results.Marland KitchenMarland Kitchen 4)  Call for any problems.Marland KitchenMarland Kitchen 5)  Please schedule a follow-up appointment in 6 months. Prescriptions: ZANTAC 75 75 MG  TABS (RANITIDINE HCL) 1 tab by mouth at bedtime...  #30 x 11   Entered and Authorized by:   Noralee Space MD   Signed by:   Noralee Space MD on 10/16/2009   Method used:   Print then Give to Patient   RxID:   [id number] LIPITOR 10 MG  TABS (ATORVASTATIN CALCIUM) once daily  #30 x 11   Entered and Authorized by:   Noralee Space MD   Signed by:   Noralee Space MD on 10/16/2009   Method used:   Print then Give to Patient   RxID:   [id number] LASIX 40 MG  TABS (FUROSEMIDE) two tablets every morning  #60 x 11   Entered and Authorized by:   Noralee Space MD   Signed by:   Noralee Space MD on 10/16/2009   Method used:   Print then Give to Patient   RxID:   [id number] TICLID 250 MG  TABS (TICLOPIDINE HCL) one tab twice daily  #60 x 11   Entered and Authorized by:   Noralee Space MD   Signed by:   Noralee Space MD on 10/16/2009   Method used:   Print then Give to Patient   RxID:   [id number] FLONASE 50 MCG/ACT  SUSP (FLUTICASONE PROPIONATE) 1-2 sprays in each nostril at bedtime as needed  #1 x 11   Entered and Authorized by:   Noralee Space MD   Signed by:   Noralee Space MD on 10/16/2009   Method used:   Print then Give to Patient   RxID:    [id number]      Medication Administration  Injection # 1:    Medication: Vit B12 1000 mcg    Diagnosis: UNSPECIFIED ANEMIA (ICD-285.9)    Route: IM    Site: L deltoid    Exp Date: 12/2010    Lot #: [id number]    Mfr: American Regent    Comments: injection given by Alroy Bailiff    Patient tolerated injection without complications  Orders Added: 1)  Est. Patient Level IV [id number] 2)  TLB-BMP (Basic Metabolic Panel-BMET) 123456 3)  TLB-Hepatic/Liver Function Pnl [80076-HEPATIC] 4)  TLB-CBC Platelet - w/Differential [85025-CBCD] 5)  TLB-Lipid Panel [80061-LIPID] 6)  TLB-TSH (Thyroid Stimulating Hormone) [84443-TSH] 7)  TLB-IBC Pnl (Iron/FE;Transferrin) [83550-IBC] 8)  Vit B12 1000 mcg [J3420] 9)  Admin  of Therapeutic Inj  intramuscular or subcutaneous [id number]

## 2010-05-29 NOTE — Progress Notes (Signed)
Summary: needs a rx for lift chair  Phone Note Call from Patient Call back at [phone]   Caller: [place] (daughter) Call For: nadel Summary of Call: mother needs to have a lift chair, if the dr rx one medicare will pay for part of it. will dr Lenna Gilford write a rx for her.  Initial call taken by: Adin Hector,  Aug 30, 2009 12:08 PM  Follow-up for Phone Call        Please advise.Francesca Jewett CMA  Aug 30, 2009 12:21 PM   yes ok to send order for a lift chair.  thanks Onida  Aug 30, 2009 12:36 PM   order palced. pt daughter aware and request it be sent to [employer]Kit [place] Bing CMA  Aug 30, 2009 1:27 PM

## 2010-05-29 NOTE — Progress Notes (Signed)
Summary: talk to nurse- returned call  Phone Note Call from Patient   Caller: Daughter brenda Call For: nadel Summary of Call: daughter have questions about 2 gram diet that dr Lenna Gilford gave to her mother Initial call taken by: Gustavus Bryant,  October 20, 2009 9:05 AM  Follow-up for Phone Call        Spoke with pt's daughter.  She states that she is trying to calculate the amount of sodium pt is consuming following the 2 gm diet.  She is confused because everything on the label is in mgs.  I advised that 1 gm = 1000 mg, therefore pt is to have no more than 200 mg of sodium daily.  Daughter states that in this case, pt has already "all along" been consuming between 1800-2000 mg sodium daily.  Wants to know if since she has been doing this all along, if needs less than 2 gm daily.  Please advise, thanks! Follow-up by: Tilden Dome,  October 20, 2009 9:49 AM  Additional Follow-up for Phone Call Additional follow up Details #1::        lmomtcb---per SN---2 gram == 2000mg ---she will need to go as low as possible with the sodium. Elita Boone CMA  October 20, 2009 2:35 PM   daughter returned call. Cooper Render, CNA  October 20, 2009 3:46 PM    Additional Follow-up for Phone Call Additional follow up Details #2::    Spoke with pt's daughter and advised of the above recs per SN.  She verbalized understanding. Follow-up by: Tilden Dome,  October 20, 2009 3:52 PM

## 2010-05-29 NOTE — Assessment & Plan Note (Signed)
Summary: fall yesterday--daughter wants pt checked/la   CC:  Add-on visit after fall at home....  History of Present Illness: 77 y/o WF here for a follow up visit... she has mult med problems as noted below... Add-on visit requested after fall at home yest>>>   ~  Jun10:  she states that she has had a good 6 months- no new complaints or concerns... she has seen a podiatrist to have her nails trimmed and get new shoes... we discussed f/u labs today and we will give her a Pneumovax & Tetanus shot...  ~  Dec10:  she reports another good 9mo & confirmed by daugh... she has new glasses & was told the beginnings of cataracts by her optometrist, DrMartin... she had the 2010 Flu vaccine in Oct...    ~  October 16, 2009:  she fell 4/11 w/ ankle sprain- no fx... now has lift chair & she is very happy... no new complaints or concerns- feels well, denies CP/ palpit/ SOB/ ch in edema/ TIA or stroke manifestations/ etc... she remains on ASA & Ticlid per her request (doesn't want to change meds);  and she is content to continue her other Rx the same as well- Lasix, Lipitor, Zantac, Vit d & B12 shots...   ~  November 28, 2009:  another fall at home- missed chair trying to sit down & hit metal FP screen w/ left side of face, left hand, left arm (no head trauma & no LOC> bruised, ecchymoses, abrasion. We discussed home PT/OT evaluation> "therapy" & safety assessment... They don't want AL/NH etc...  We discussed DNR/ NCB per pt & family request...   Current Problem List:  ALLERGIC RHINITIS (ICD-477.9) - she uses Claritin & Flonase as needed... she has 2 cats- Heidi & Aline Brochure!  HEARING LOSS (ICD-389.9) - she doesn't have hearing aides- "it wouldn't work"...  DENTAL CARIES (ICD-521.00) - she has been repeatedly reminded to seek dental help, but refuses... family purees her foods...  CEREBROVASCULAR DISEASE (ICD-437.9) - s/p right CAE by [facility] 1992... stable on ASA 81mg /d and TICLID 250mg Bid... she refuses  repeat/ f/u CDopplers, etc... she has been doing well and remains asymptomatic- denies focal weakness, numbness, paresthesia, pain, slurred speech, headache, dizziness, tremor, or seizure.  VENOUS INSUFFICIENCY, CHRONIC (ICD-459.81) - she has chronic LE edema- on LASIX 80mg /d and low sodium diet... she has gained 23# to 152# in 2009 w/ Ensure & puree diet from famiy, now down a few to 149#... advises on low sodium, elevate legs, support hose...  ~  6/11:  BUN=40, Creat=1.6 on the Lasix80 & asked to decr to 40mg /d and restrict sodium.  HYPERCHOLESTEROLEMIA (ICD-272.0) - on diet + LIPITOR 10mg /d... her TG's have been elevated but she refuses more meds.  ~  labs 3/08 showed TChol 183, TG 205, HDL 54, LDL 98  ~  labs 6/09 showed TChol 190, TG 314, HDL 43, LDL 94... she refuses more meds, rec low fat diet.  ~  labs 6/10 showed TChol 184, TG 279, HDL 50, LDL 97  ~  labs 6/11 showed TChol 180, TG 277, HDL 49, LDL 95  HIATAL HERNIA (ICD-553.3) - large HH seen on prev CT Abd in 2001... takes ZANTAC 75mg  Qhs...  RENAL INSUFFICIENCY (ICD-588.9) - Creat= 1.5 to 1.7 in 2008...  ~  labs 6/09 showed BUN= 33, Creat= 1.4  ~  labs 6/10 showed BUN= 36, Creat= 1.5  ~  labs 12/10 showed BUN= 33, Creat= 1.4  ~  labs 6/11 showed BUN=40, Creat=1.6.Marland KitchenMarland Kitchen rec  to decr Lasix from 80 to 40mg /d.  UTI'S, HX OF (ICD-V13.00)  VITAMIN D DEFICIENCY (ICD-268.9) - she is likely osteoporotic but unmeasured due to refusal of BMD & Bisphos therapy options...  ~  labs 6/09 showed Vit D level = 26... rec to take OTC Vit D 1000 u daily...  ~  labs 6/10 showed Vit D level = 24... rec to incr Vit D to 2000 u daily.  Hx of STROKE (ICD-434.91) - hx right brain stroke in 1992 w/ CTBrain showing right post temporal & parietal low density area w/ encephalomalacia...  SENILE DEMENTIA (ICD-290.0)  PERNICIOUS ANEMIA (ICD-281.0) - on monthly B12 shots for years... in 2006 she was also iron deficient and received IV iron therapy (she refused GI  eval)... she feels well and denies weakness, fatigue, bleeding, dark stools, etc...  ~  labs 3/08 showed Hg= 13.2  ~  labs 6/09 showed Hg= 12.7  ~  labs 6/10 showed Hg= 11.7... Fe, B12, Folate were all WNL.  ~  labs 12/10 showed Hg= 12.4  NOTE*** She has repeatedly refused GYN exams, PAP smears, Mammograms, & Colonoscopy...   Preventive Screening-Counseling & Management  Alcohol-Tobacco     Smoking Status: never  Allergies: 1)  ! Novocain (Procaine Hcl)  Comments:  Nurse/Medical Assistant: The patient's medications and allergies were reviewed with the patient and were updated in the Medication and Allergy Lists.  Past History:  Past Medical History: ALLERGIC RHINITIS (ICD-477.9) HEARING LOSS (ICD-389.9) DENTAL CARIES (ICD-521.00) CEREBROVASCULAR DISEASE (ICD-437.9) VENOUS INSUFFICIENCY, CHRONIC (ICD-459.81) HYPERCHOLESTEROLEMIA (ICD-272.0) HIATAL HERNIA (ICD-553.3) RENAL INSUFFICIENCY (ICD-588.9) UTI'S, HX OF (ICD-V13.00) VITAMIN D DEFICIENCY (ICD-268.9) Hx of STROKE (ICD-434.91) SENILE DEMENTIA (ICD-290.0) PERNICIOUS ANEMIA (ICD-281.0)  Past Surgical History: S/P cholecystectomy - 1984 by DrBallen S/P Rt Carotid Endarterectormy by Sheryn Bison in 1992  Family History: Reviewed history from 10/08/2007 and no changes required. Father died from an MI Mother died from Valvular Heart Disease 6 Sibling - 3 Bro & 3 Sis  Social History: Reviewed history from 10/08/2007 and no changes required. Widow 2 Children  Review of Systems      See HPI  Vital Signs:  Patient profile:   77 year old female Height:      64 inches Weight:      144.38 pounds BMI:     24.87 O2 Sat:      99 % on Room air Temp:     97.7 degrees F oral Pulse rate:   71 / minute BP sitting:   138 / 68  (right arm) Cuff size:   regular  Vitals Entered By: Elita Boone CMA (November 28, 2009 9:00 AM)  O2 Sat at Rest %:  99 O2 Flow:  Room air CC: Add-on visit after fall at home... Is Patient  Diabetic? No Pain Assessment Patient in pain? yes      Onset of pain  from a fall on sunday Comments no changes in meds today   Physical Exam  Additional Exam:  WD WN 77 y/o WF in NAD.Marland Kitchen. several bruises & ecchymoses as noted... GENERAL:  Alert & oriented x 2;  pleasant & cooperative... HEENT:  [place]/AT, EOM-wnl, PERRLA, Fundi-benign, EACs-clear, TMs-wnl, NOSE-clear, THROAT-clear but dentition is very poor. NECK:  Supple w/ adeq ROM; no JVD; carotid impulses OK w/o bruits, +CAE scar on Rt; no thyromegaly or nodules palpated; no lymphadenopathy. CHEST:  Clear to P & A; without wheezes/ rales/ or rhonchi. HEART:  Regular Rhythm; without murmurs/ rubs/ or gallops. ABDOMEN:  Soft & nontender; normal  bowel sounds; no organomegaly or masses detected. EXT: without deformities or arthritic changes; she has VI changes w/ 2+edema on Lft & 1+edema on Rt NEURO:  CN's intact;  no focal neuro deficits... DERM:  No lesions noted; no rash- just the bruises & abrasion from fall.    Impression & Recommendations:  Problem # 1:  ACCIDENTAL FALL FROM CHAIR [phone]) We discussed wound care by daughter... We will arrange for home PT/ OT/ safety check & any DME needs... Orders: Misc. Referral (Misc. Ref)  Problem # 2:  CEREBROVASCULAR DISEASE (ICD-437.9) Stable on ASA & Ticlid... continue same.  Problem # 3:  HYPERCHOLESTEROLEMIA (ICD-272.0) Stable on diet + Lipitor... continue same. Her updated medication list for this problem includes:    Lipitor 10 Mg Tabs (Atorvastatin calcium) ..... Once daily  Problem # 4:  RENAL INSUFFICIENCY (ICD-588.9) Stable renal function... Creat in the 1.4- 1.6 range...  Problem # 5:  SENILE DEMENTIA (ICD-290.0) Aware & discussed w/ pt & family... they indicate her desire for NCB/ DNR status.  Problem # 6:  PERNICIOUS ANEMIA (ICD-281.0) Stable on the B12 shots... Her updated medication list for this problem includes:    Ferrex 150 150 Mg Caps (Polysaccharide  iron complex) .Marland Kitchen... Take 1 tablet by mouth once a day  Complete Medication List: 1)  Claritin 10 Mg Tabs (Loratadine) .Marland Kitchen.. 1 tab daily as needed for allergy symptoms.Marland KitchenMarland Kitchen 2)  Flonase 50 Mcg/act Susp (Fluticasone propionate) .Marland Kitchen.. 1-2 sprays in each nostril at bedtime as needed 3)  Adult Aspirin Low Strength 81 Mg Tbdp (Aspirin) .... Once daily 4)  Ticlid 250 Mg Tabs (Ticlopidine hcl) .... One tab twice daily 5)  Lasix 40 Mg Tabs (Furosemide) .Marland Kitchen.. 1  tablet every morning 6)  Lipitor 10 Mg Tabs (Atorvastatin calcium) .... Once daily 7)  Zantac 75 75 Mg Tabs (Ranitidine hcl) .Marland Kitchen.. 1 tab by mouth at bedtime.Marland KitchenMarland Kitchen 8)  Multivitamins Tabs (Multiple vitamin) 9)  Vitamin D3 1000 Unit Caps (Cholecalciferol) .... Take 1 cap by mouth once daily... 10)  Ferrex 150 150 Mg Caps (Polysaccharide iron complex) .... Take 1 tablet by mouth once a day  Patient Instructions: 1)  Today we updated your med list- see below.... 2)  continue your current meds the same... 3)  We will arrange for Home PT/ OT/ & assess for any Medical equipment needs... 4)  clean & dress the wound w/ mild soapy water, no-stick telfa pads and paper tape... 5)  Call for any questions.Marland KitchenMarland Kitchen 6)  Keep your prev scheduled f/u appt.Marland KitchenMarland Kitchen

## 2010-05-29 NOTE — Miscellaneous (Signed)
Summary: Care Plans/[facility]  Care Plans/[facility]   Imported By: Phillis Knack 12/21/2009 09:00:26  _____________________________________________________________________  External Attachment:    Type:   Image     Comment:   External Document

## 2010-05-29 NOTE — Progress Notes (Signed)
Summary: talk to nurse  Phone Note Call from Patient Call back at [phone]   Caller: Daughter//brenda joyce Call For: nadel Summary of Call: Daughter states pt fell 4/23 approx 11a and that she was lightheaded prior to this nothing was broken, she c/o bruising and soreness. She was examined by neighbor(emt) that said everything appeared to be ok but to notify SN today. Her bp after the fall was 176/69 p71. States that pt is c/o of sore left ankle. pls advise. Initial call taken by: Netta Neat,  August 21, 2009 8:55 AM  Follow-up for Phone Call        spoke with daughter she states pt has complained about her ankle and foot hurting since the fall-daughter would like for someone to see her just to be sure everything is ok--scheduled pt to see tp today at 4:15 Follow-up by: [place],  August 21, 2009 9:40 AM

## 2010-05-29 NOTE — Assessment & Plan Note (Signed)
Summary: b12/PC  Nurse Visit   Allergies: 1)  ! Novocain (Procaine Hcl)  Medication Administration  Injection # 1:    Medication: Vit B12 1000 mcg    Diagnosis: 285.9    Route: SQ    Site: L deltoid    Exp Date: 05/2011    Lot #: [id number]    Mfr: [employer] Regent    Comments: b-12 shot charged [phone] and j3420    Patient tolerated injection without complications    Given by: tammy Wisam Siefring in allergy lab   Medication Administration  Injection # 1:    Medication: Vit B12 1000 mcg    Diagnosis: 285.9    Route: SQ    Site: L deltoid    Exp Date: 05/2011    Lot #: [id number]    Mfr: [employer] Regent    Comments: b-12 shot charged [phone] and [phone]    Patient tolerated injection without complications    Given by: tammy Kalise Fickett in allergy lab

## 2010-05-29 NOTE — Assessment & Plan Note (Signed)
Summary: B12 ///KP  Nurse Visit   Allergies: 1)  ! Novocain (Procaine Hcl)  Medication Administration  Injection # 1:    Medication: Vit B12 1000 mcg    Diagnosis: UNSPECIFIED ANEMIA (ICD-285.9)    Route: SQ    Site: L deltoid    Exp Date: 12/31/2010    Lot #: [id number]    Mfr: [employer] Regent    Comments: 1000MG  B-12     Patient tolerated injection without complications    Given by: [facility]  Orders Added: 1)  Vit B12 1000 mcg [J3420] 2)  Admin of Therapeutic Inj  intramuscular or subcutaneous [[id number]]   Medication Administration  Injection # 1:    Medication: Vit B12 1000 mcg    Diagnosis: UNSPECIFIED ANEMIA (ICD-285.9)    Route: SQ    Site: L deltoid    Exp Date: 12/31/2010    Lot #: [id number]    Mfr: [employer] Regent    Comments: 1000MG  B-12     Patient tolerated injection without complications    Given by: [facility]  Orders Added: 1)  Vit B12 1000 mcg [J3420] 2)  Admin of Therapeutic Inj  intramuscular or subcutaneous [id number]

## 2010-05-29 NOTE — Assessment & Plan Note (Signed)
Summary: B-12/APC  Nurse Visit   Allergies: 1)  ! Novocain (Procaine Hcl)  Medication Administration  Injection # 1:    Medication: Vit B12 1000 mcg    Diagnosis: UNSPECIFIED ANEMIA (ICD-285.9)    Route: SQ    Site: L deltoid    Exp Date: 08/2011    Lot #: [id number]    Mfr: [employer] Regent    Comments: B-12 1000MG  CHARGED [id number] AND J3420    Given by: Alroy Bailiff IN ALLERGY LAB  Orders Added: 1)  Admin of Therapeutic Inj  intramuscular or subcutaneous [96372] 2)  Vit B12 1000 mcg [J3420]   Medication Administration  Injection # 1:    Medication: Vit B12 1000 mcg    Diagnosis: UNSPECIFIED ANEMIA (ICD-285.9)    Route: SQ    Site: L deltoid    Exp Date: 08/2011    Lot #: [id number]    Mfr: [employer] Regent    Comments: B-12 1000MG  CHARGED [id number] AND J3420    Given by: [facility]  Orders Added: 1)  Admin of Therapeutic Inj  intramuscular or subcutaneous [96372] 2)  Vit B12 1000 mcg [id number]

## 2010-05-29 NOTE — Assessment & Plan Note (Signed)
Summary: B12/APC  Nurse Visit   Allergies: 1)  ! Novocain (Procaine Hcl)  Medication Administration  Injection # 1:    Medication: Vit B12 1000 mcg    Diagnosis: UNSPECIFIED ANEMIA (ICD-285.9)    Route: IM    Site: L deltoid    Exp Date: 05/31/2011    Lot #: [id number]    Mfr: [employer] Regent    Comments: vitamin b-12 1000 micrograms in left deltoid. Patient tolerated injection ok.    Given by: Tammy Lashawna Poche, Allergy tech  Orders Added: 1)  Vit B12 1000 mcg [J3420]   Medication Administration  Injection # 1:    Medication: Vit B12 1000 mcg    Diagnosis: UNSPECIFIED ANEMIA (ICD-285.9)    Route: IM    Site: L deltoid    Exp Date: 05/31/2011    Lot #: [id number]    Mfr: [employer] Regent    Comments: vitamin b-12 1000 micrograms in left deltoid. Patient tolerated injection ok.    Given by: Tammy Erandi Lemma, Allergy tech  Orders Added: 1)  Vit B12 1000 mcg [id number]

## 2010-05-29 NOTE — Letter (Signed)
Summary: CMN for Seat Lift/[facility]  CMN for [facility] L[facility]   Imported By: Phillis Knack 10/19/2009 11:50:56  _____________________________________________________________________  External Attachment:    Type:   Image     Comment:   External Document

## 2010-05-29 NOTE — Assessment & Plan Note (Signed)
Summary: B12 ///kp  Nurse Visit   Allergies: 1)  ! Novocain (Procaine Hcl)  Medication Administration  Injection # 1:    Medication: Vit B12 1000 mcg    Diagnosis: UNSPECIFIED ANEMIA (ICD-285.9)    Route: SQ    Site: L deltoid    Exp Date: 12/2010    Lot #: [id number]    Mfr: [employer] Regent    Comments: Injection given by Alroy Bailiff in allergy lab.     Patient tolerated injection without complications  Orders Added: 1)  Vit B12 1000 mcg [J3420] 2)  Admin of Therapeutic Inj  intramuscular or subcutaneous [id number]

## 2010-05-29 NOTE — Assessment & Plan Note (Signed)
Summary: B-12/APC  Nurse Visit   Allergies: 1)  ! Novocain (Procaine Hcl)  Medication Administration  Injection # 1:    Medication: Vit B12 1000 mcg    Diagnosis: ANEMIA (N067566.9)    Route: IM    Site: R deltoid    Exp Date: 05/31/2011    Lot #: [id number]    Mfr: Vitamin B-12    Comments: Vitamin B-12 1000 micrograms  Pt tolerated shot fine.    Given by: Alroy Bailiff, Allergy Tech  Orders Added: 1)  Vit B12 1000 mcg [J3420] 2)  Admin of Therapeutic Inj  intramuscular or subcutaneous [[id number]]    Medication Administration  Injection # 1:    Medication: Vit B12 1000 mcg    Diagnosis: ANEMIA (N067566.9)    Route: IM    Site: R deltoid    Exp Date: 05/31/2011    Lot #: [id number]    Mfr: Vitamin B-12    Comments: Vitamin B-12 1000 micrograms  Pt tolerated shot fine.    Given by: Alroy Bailiff, Allergy Tech  Orders Added: 1)  Vit B12 1000 mcg [J3420] 2)  Admin of Therapeutic Inj  intramuscular or subcutaneous [id number]

## 2010-05-29 NOTE — Progress Notes (Signed)
Summary: appointment  Phone Note Call from Patient Call back at [phone]   Caller: Daughter brenda Call For: [place] Senters Summary of Call: would like for pt to be seen today because she had a fall yesterday.  have bruise on arm and elbow and split her lip. Initial call taken by: Gustavus Bryant,  November 27, 2009 8:33 AM  Follow-up for Phone Call        Called, spoke with pt's daughter, Hassan Rowan.  Per Hassan Rowan, pt fell yesterday morning.  Bit lip when she fell, has bruises on left check,  skin tears on left elbow and right arm, left index finger swollen.  Hassan Rowan has been applying ice on finger.  Requesting OV today to make sure nothing is broke and to advise proper treatment for injured areas.  SN booked, TP out of office, MW may open office this evening.  Raymondo Band RN  November 27, 2009 9:44 AM   Additional Follow-up for Phone Call Additional follow up Details #1::        called and spoke with brenda---pts daughter---she is aware that SN has nothing open today---offered for pt to be seen at [facility] to make sure nothing was broken and offered appt with MW this afternoon--pts daughter stated that pt feels more comfortable with SN so they will wait till in the am to see SN--she stated that pt is not in pain at this time [place]  November 27, 2009 11:56 AM

## 2010-05-29 NOTE — Assessment & Plan Note (Signed)
Summary: Acute NP office visit - fall   CC:  pt fell 08-19-09 at home on her left side.  has some bruising/soreness.  states was dizzy before the fall and having a BM.Marland Kitchen  History of Present Illness: 77 y/o WF here for a follow up visit... she has mult med problems as noted below... she states that she is feeling well and (as always) she is in good spirits-    ~  October 04, 2008:  she states that she has had a good 6 months- no new complaints or concerns... she has seen a podiatrist to have her nails trimmed and get new shoes... we discussed f/u labs today and we will give her a Pneumovax & Tetanus shot...   ~  April 17, 2009:  she reports another good 95mo & confirmed by daugh... she has new glasses & was told the beginnings of cataracts by her optometrist, DrMartin... she had the 2010 Flu vaccine in Oct...   August 21, 2009 --Presents for an acute office visit. pt fell 08-19-09 at home on her left side.  has some bruising/soreness along left arm and ankle. States was dizzy when she stood up after having a BM. She did not LOC, She got dizzy when she stood up. Felt lightheaded. Daughter was there, helped her up. Has not noted any confusion or change in behavior. She has been weight bearing. but complains of left foot/ankle pain. She is on ASA and Ticlid. Post fall B/P 170/70, HR 71, no slurred speech, no chest pian, no palpitations.  Denies chest pain, dyspnea, orthopnea, hemoptysis, fever, n/v/d, edema, headache.      Medications Prior to Update: 1)  Claritin 10 Mg  Tabs (Loratadine) .Marland Kitchen.. 1 Tab Daily As Needed For Allergy Symptoms.Marland KitchenMarland Kitchen 2)  Flonase 50 Mcg/act  Susp (Fluticasone Propionate) .Marland Kitchen.. 1-2 Sprays in Each Nostril At Bedtime As Needed 3)  Adult Aspirin Low Strength 81 Mg  Tbdp (Aspirin) .... Once Daily 4)  Ticlid 250 Mg  Tabs (Ticlopidine Hcl) .... Two Times A Day 5)  Lasix 40 Mg  Tabs (Furosemide) .... Two Tablets Every Morning 6)  Lipitor 10 Mg  Tabs (Atorvastatin Calcium) .... Once Daily 7)   Zantac 75 75 Mg  Tabs (Ranitidine Hcl) .... At Bedtime 8)  Multivitamins   Tabs (Multiple Vitamin) 9)  Vitamin D3 1000 Unit Caps (Cholecalciferol) .... Take 1 Cap By Mouth Once Daily... 10)  Ferrex 150 150 Mg  Caps (Polysaccharide Iron Complex) .... Take 1 Tablet By Mouth Once A Day 11)  Tylenol Extra Strength 500 Mg  Tabs (Acetaminophen) .... As Needed  Current Medications (verified): 1)  Claritin 10 Mg  Tabs (Loratadine) .Marland Kitchen.. 1 Tab Daily As Needed For Allergy Symptoms.Marland KitchenMarland Kitchen 2)  Flonase 50 Mcg/act  Susp (Fluticasone Propionate) .Marland Kitchen.. 1-2 Sprays in Each Nostril At Bedtime As Needed 3)  Adult Aspirin Low Strength 81 Mg  Tbdp (Aspirin) .... Once Daily 4)  Ticlid 250 Mg  Tabs (Ticlopidine Hcl) .... Two Times A Day 5)  Lasix 40 Mg  Tabs (Furosemide) .... Two Tablets Every Morning 6)  Lipitor 10 Mg  Tabs (Atorvastatin Calcium) .... Once Daily 7)  Zantac 75 75 Mg  Tabs (Ranitidine Hcl) .... At Bedtime 8)  Multivitamins   Tabs (Multiple Vitamin) 9)  Vitamin D3 1000 Unit Caps (Cholecalciferol) .... Take 1 Cap By Mouth Once Daily... 10)  Ferrex 150 150 Mg  Caps (Polysaccharide Iron Complex) .... Take 1 Tablet By Mouth Once A Day 11)  Tylenol Extra Strength 500  Mg  Tabs (Acetaminophen) .... As Needed  Allergies (verified): 1)  ! Novocain (Procaine Hcl)  Past History:  Past Surgical History: Last updated: 04/17/2009 S/P cholecystectomy - 1984 by DrBallen S/P Rt Carotid Endarterectormy by Sheryn Bison in 1992  Family History: Last updated: Oct 17, 2007 Father died from an MI Mother died from Valvular Heart Disease 6 Sibling - 3 Bro & 3 Sis  Social History: Last updated: Oct 17, 2007 Widow 2 Children  Risk Factors: Smoking Status: never (04/05/2008)  Past Medical History: ALLERGIC RHINITIS (ICD-477.9) - she uses Claritin & Flonase as needed... she has 2 cats- Heidi & Aline Brochure!  HEARING LOSS (ICD-389.9) - she doesn't have hearing aides- "it wouldn't work"...  DENTAL CARIES (ICD-521.00) - she  has been repeatedly reminded to seek dental help, but refuses... family purees her foods...  CEREBROVASCULAR DISEASE (ICD-437.9) - s/p right CAE by [facility] 1992... stable on ASA 81mg /d and TICLID 250mg Bid... she refuses repeat/ f/u CDopplers, etc... she has been doing well and remains asymptomatic-   VENOUS INSUFFICIENCY, CHRONIC (ICD-459.81) - she has chronic LE edema- on LASIX 80mg /d and low sodium diet...    HYPERCHOLESTEROLEMIA (ICD-272.0) - on diet + LIPITOR 10mg /d... her TG's have been elevated but she refuses more meds (Fibrate recommended)...  ~  labs 3/08 showed TChol 183, TG 205, HDL 54, LDL 98  ~  labs 6/09 showed TChol 190, TG 314, HDL 43, LDL 94... she refuses more meds, rec low fat diet.  ~  labs 6/10 showed TChol 184, TG 279, HDL 50, LDL 97     Review of Systems      See HPI  Vital Signs:  Patient profile:   77 year old female Height:      64 inches Weight:      147.50 pounds O2 Sat:      97 % on Room air Temp:     100.1 degrees F oral Pulse rate:   78 / minute BP sitting:   152 / 72  (right arm) Cuff size:   regular  Vitals Entered By: Parke Poisson CNA (August 21, 2009 4:17 PM)  O2 Flow:  Room air CC: pt fell 08-19-09 at home on her left side.  has some bruising/soreness.  states was dizzy before the fall and having a BM. Is Patient Diabetic? No Comments Medications reviewed with patient Daytime contact number verified with patient. Parke Poisson CNA  August 21, 2009 4:17 PM    Physical Exam  Additional Exam:  WD WN 77 y/o WF in NAD... GENERAL:  Alert & oriented x 2;  pleasant & cooperative... HEENT:  [place]/AT, EOM-wnl, PERRLA, Fundi-benign, EACs-clear, TMs-wnl, NOSE-clear, THROAT-clear but dentition is very poor. NECK:  Supple w/ adeq ROM; no JVD; carotid impulses OK w/o bruits, +CAE scar on Rt; no thyromegaly or nodules palpated; no lymphadenopathy. CHEST:  Clear to P & A; without wheezes/ rales/ or rhonchi. HEART:  Regular Rhythm; without murmurs/ rubs/ or  gallops. ABDOMEN:  Soft & nontender; normal bowel sounds; no organomegaly or masses detected. EXT: without deformities or arthritic changes; she has VI changes w/ 2+edema on Lft & 1+edema on Rt, tender along distal left lower leg, and aklle.  NEURO:  CN's intact;  no focal neuro deficits...MAEW x 4 .  DERM:  small eccymosis along left forearm.      Impression & Recommendations:  Problem # 1:  POSTURAL LIGHTHEADEDNESS (ICD-780.4)  s/p dizziness and fall after using Bathroom-BM. suspect this was a vasovagal reaction w/ transient hypotension she did not have any associated  LOC , Palpitations, chest pain or neuro changes.  She is on Ticlid and ASA- no sign of head injury. Pt family to watch for neuro changes.  Advised to increase fluids and change posititon slolwy.  labs pending.  Orders: Est. Patient Level IV [id number])  Problem # 2:  ANKLE PAIN (ICD-719.47) s/p fall on 4/23 check xray to r/o fx.  Orders: T-Tib/Fib Left (73590TC) T-Ankle Comp Left Min 3 Views (73610TC) Est. Patient Level IV [id number])  Complete Medication List: 1)  Claritin 10 Mg Tabs (Loratadine) .Marland Kitchen.. 1 tab daily as needed for allergy symptoms.Marland KitchenMarland Kitchen 2)  Flonase 50 Mcg/act Susp (Fluticasone propionate) .Marland Kitchen.. 1-2 sprays in each nostril at bedtime as needed 3)  Adult Aspirin Low Strength 81 Mg Tbdp (Aspirin) .... Once daily 4)  Ticlid 250 Mg Tabs (Ticlopidine hcl) .... Two times a day 5)  Lasix 40 Mg Tabs (Furosemide) .... Two tablets every morning 6)  Lipitor 10 Mg Tabs (Atorvastatin calcium) .... Once daily 7)  Zantac 75 75 Mg Tabs (Ranitidine hcl) .... At bedtime 8)  Multivitamins Tabs (Multiple vitamin) 9)  Vitamin D3 1000 Unit Caps (Cholecalciferol) .... Take 1 cap by mouth once daily... 10)  Ferrex 150 150 Mg Caps (Polysaccharide iron complex) .... Take 1 tablet by mouth once a day 11)  Tylenol Extra Strength 500 Mg Tabs (Acetaminophen) .... As needed  Other Orders: TLB-BMP (Basic Metabolic Panel-BMET)  (99991111) TLB-CBC Platelet - w/Differential (85025-CBCD)  Patient Instructions: 1)  Change positions slowly.  2)  Increase fluids.  3)  Look into support device for toilet.  4)  I will call with xray results.  5)  Please contact office for sooner follow up if symptoms do not improve or worsen  6)  follow up Dr. Lenna Gilford as scheduled.

## 2010-05-31 NOTE — Assessment & Plan Note (Signed)
Summary: b-12 injection//jwr  Nurse Visit   Allergies: 1)  ! Novocain (Procaine Hcl)  Medication Administration  Injection # 1:    Medication: Vit B12 1000 mcg    Diagnosis: ANEMIA (R2654735.9)    Route: IM    Site: L deltoid    Exp Date: 10/2011    Lot #: [id number]    Mfr: [place]    Comments: 1000 MCG    Given by: Lynelle Smoke Ruhee Enck [facility]  Orders Added: 1)  Admin of Injection (IM/SQ) [id number] 2)  Vit B12 1000 mcg [J3420]   Medication Administration  Injection # 1:    Medication: Vit B12 1000 mcg    Diagnosis: ANEMIA (R2654735.9)    Route: IM    Site: L deltoid    Exp Date: 10/2011    Lot #: [id number]    Mfr: [place]    Comments: 1000 MCG    Given by: TAMMY Avea Mcgowen [facility]  Orders Added: 1)  Admin of Injection (IM/SQ) [id number] 2)  Vit B12 1000 mcg [id number]

## 2010-05-31 NOTE — Assessment & Plan Note (Signed)
Summary: 6 months/apc   CC:  4 month ROV & review of mult medical problems....  History of Present Illness: 77 y/o WF here for a follow up visit... she has mult med problems as noted below...   ~  October 16, 2009:  she fell 4/11 w/ ankle sprain- no fx... now has lift chair & she is very happy... no new complaints or concerns- feels well, denies CP/ palpit/ SOB/ ch in edema/ TIA or stroke manifestations/ etc... she remains on ASA & Ticlid per her request (doesn't want to change meds);  and she is content to continue her other Rx the same as well- Lasix, Lipitor, Zantac, Vit d & B12 shots...   ~  November 28, 2009:  another fall at home- missed chair trying to sit down & hit metal FP screen w/ left side of face, left hand, left arm (no head trauma & no LOC> bruised, ecchymoses, abrasion. We discussed home PT/OT evaluation> "therapy" & safety assessment... They don't want AL/NH etc...  We discussed DNR/ NCB per pt & family request...   ~  April 16, 2010:  50mo ROV- doing satis w/o additional falls in the interim... daugh notes slowing down, ?weaker, less moblie, & she has finished the phys therapy... notes some sinus drainage & we discussed Claritin, Flonase, Saline, Mucinex... last OV we decr the Lasix from 80 to 40 due to Creat up to 1.6 (it is 1.5 today) & she has been doing satis & wt actually down 2# today... she denies cerebral ischemic symptoms on the ASA/ Ticlid;  chr swelling stable w/ Lasix40 now;  Lipids have been good on Lip10;  wants B12 shot today- OK.   Current Problem List:  ALLERGIC RHINITIS (ICD-477.9) - she uses Claritin & Flonase as needed... she has 2 cats- Heidi & Aline Brochure!  HEARING LOSS (ICD-389.9) - she doesn't have hearing aides- "it wouldn't work"...  DENTAL CARIES (ICD-521.00) - she has been repeatedly reminded to seek dental help, but refuses... family purees her foods...  CEREBROVASCULAR DISEASE (ICD-437.9) - s/p right CAE by [facility] 1992... stable on ASA 81mg /d and  TICLID 250mg Bid... she refuses repeat/ f/u CDopplers, etc... she has been doing well and remains asymptomatic- denies focal weakness, numbness, paresthesia, pain, slurred speech, headache, dizziness, tremor, or seizure.  VENOUS INSUFFICIENCY, CHRONIC (ICD-459.81) - she has chronic LE edema- on LASIX 40mg /d now and low sodium diet... she had gained 23# to 152# in 2009 w/ Ensure & puree diet from famiy, now down to 142#... advises on low sodium, elevate legs, support hose...  ~  6/11:  BUN=40, Creat=1.6 on the Lasix80 & asked to decr to 40mg /d and restrict sodium.  ~  12/11:  labs showed BUN=41, Creat=1.5 on Lasix40... keep same for now.  HYPERCHOLESTEROLEMIA (ICD-272.0) - on diet + LIPITOR 10mg /d... her TG's have been elevated but she refuses more meds.  ~  labs 3/08 showed TChol 183, TG 205, HDL 54, LDL 98  ~  labs 6/09 showed TChol 190, TG 314, HDL 43, LDL 94... she refuses more meds, rec low fat diet.  ~  labs 6/10 showed TChol 184, TG 279, HDL 50, LDL 97  ~  labs 6/11 showed TChol 180, TG 277, HDL 49, LDL 95  ~  FLP 12/11 showed TChol 168. TH 184, HDL50, LDL 81  HIATAL HERNIA (ICD-553.3) - large HH seen on prev CT Abd in 2001... takes ZANTAC 75mg  Qhs...  RENAL INSUFFICIENCY (ICD-588.9) - Creat= 1.5 to 1.7 in 2008...  ~  labs  6/09 showed BUN= 33, Creat= 1.4  ~  labs 6/10 showed BUN= 36, Creat= 1.5  ~  labs 12/10 showed BUN= 33, Creat= 1.4  ~  labs 6/11 showed BUN=40, Creat=1.6.Marland KitchenMarland Kitchen rec to decr Lasix from 80 to 40mg /d.  ~  labs 12/11 showed BUN=41, creat=1.5  UTI'S, HX OF (ICD-V13.00)  VITAMIN D DEFICIENCY (ICD-268.9) - she is likely osteoporotic but unmeasured due to refusal of BMD & Bisphos therapy options...  ~  labs 6/09 showed Vit D level = 26... rec to take OTC Vit D 1000 u daily...  ~  labs 6/10 showed Vit D level = 24... rec to incr Vit D to 2000 u daily.  Hx of STROKE (ICD-434.91) - hx right brain stroke in 1992 w/ CTBrain showing right post temporal & parietal low density area w/  encephalomalacia...  SENILE DEMENTIA (ICD-290.0)  ANEMIA (ICD-285.9) & PERNICIOUS ANEMIA (ICD-281.0) - on monthly B12 shots for years... in 2006 she was also iron deficient and received IV iron therapy (she refused GI eval)... on FeSO4 daily... she feels well and denies weakness, fatigue, bleeding, dark stools, etc...  ~  labs 3/08 showed Hg= 13.2  ~  labs 6/09 showed Hg= 12.7  ~  labs 6/10 showed Hg= 11.7... Fe, B12, Folate were all WNL.  ~  labs 12/10 showed Hg= 12.4  ~  labs 12/11 showed Hg= 12.2, Fe=106 (34%sat)...  NOTE*** She has repeatedly refused GYN exams, PAP smears, Mammograms, & Colonoscopy...   Preventive Screening-Counseling & Management  Alcohol-Tobacco     Smoking Status: never  Allergies: 1)  ! Novocain (Procaine Hcl)  Past History:  Past Medical History: ALLERGIC RHINITIS (ICD-477.9) HEARING LOSS (ICD-389.9) DENTAL CARIES (ICD-521.00) CEREBROVASCULAR DISEASE (ICD-437.9) VENOUS INSUFFICIENCY, CHRONIC (ICD-459.81) HYPERCHOLESTEROLEMIA (ICD-272.0) HIATAL HERNIA (ICD-553.3) RENAL INSUFFICIENCY (ICD-588.9) UTI'S, HX OF (ICD-V13.00) VITAMIN D DEFICIENCY (ICD-268.9) Hx of STROKE (ICD-434.91) SENILE DEMENTIA (ICD-290.0) ANEMIA (ICD-285.9) PERNICIOUS ANEMIA (ICD-281.0)  Past Surgical History: S/P cholecystectomy - 1984 by DrBallen S/P Rt Carotid Endarterectormy by Sheryn Bison in 1992  Family History: Reviewed history from 10/08/2007 and no changes required. Father died from an MI Mother died from Valvular Heart Disease 6 Sibling - 3 Bro & 3 Sis  Social History: Reviewed history from 11/28/2009 and no changes required. Widow 2 Children  Review of Systems      See HPI       The patient complains of decreased hearing, dyspnea on exertion, and difficulty walking.  The patient denies anorexia, fever, weight loss, weight gain, vision loss, hoarseness, chest pain, syncope, peripheral edema, prolonged cough, headaches, hemoptysis, abdominal pain, melena,  hematochezia, severe indigestion/heartburn, hematuria, incontinence, muscle weakness, suspicious skin lesions, transient blindness, depression, unusual weight change, abnormal bleeding, enlarged lymph nodes, and angioedema.    Vital Signs:  Patient profile:   77 year old female Height:      64 inches Weight:      142.25 pounds BMI:     24.51 O2 Sat:      99 % on Room air Temp:     96.8 degrees F oral Pulse rate:   62 / minute BP sitting:   132 / 74  (left arm) Cuff size:   regular  Vitals Entered By: Elita Boone CMA (April 16, 2010 9:23 AM)  O2 Sat at Rest %:  99 O2 Flow:  Room air CC: 4 month ROV & review of mult medical problems... Is Patient Diabetic? No Pain Assessment Patient in pain? no      Comments no changes in meds today  Physical Exam  Additional Exam:  WD WN 77 y/o WF in NAD...  GENERAL:  Alert & oriented x 2;  pleasant & cooperative... HEENT:  [place]/AT, EOM-wnl, PERRLA, Fundi-benign, EACs-clear, TMs-wnl, NOSE-clear, THROAT-clear but dentition is very poor. NECK:  Supple w/ adeq ROM; no JVD; carotid impulses OK w/o bruits, +CAE scar on Rt; no thyromegaly or nodules palpated; no lymphadenopathy. CHEST:  Clear to P & A; without wheezes/ rales/ or rhonchi. HEART:  Regular Rhythm; without murmurs/ rubs/ or gallops. ABDOMEN:  Soft & nontender; normal bowel sounds; no organomegaly or masses detected. EXT: without deformities or arthritic changes; she has VI changes w/ 2+edema on Lft & 1+edema on Rt NEURO:  CN's intact;  no focal neuro deficits... DERM:  No lesions noted; no rash- just the bruises & abrasion from fall.    MISC. Report  Procedure date:  04/16/2010  Findings:      BMP (METABOL)   Sodium                    145 mEq/L                   135-145   Potassium                 4.8 mEq/L                   3.5-5.1   Chloride                  109 mEq/L                   96-112   Carbon Dioxide            26 mEq/L                    19-32   Glucose               [H]  108 mg/dL                   70-99   BUN                  [H]  41 mg/dL                    6-23   Creatinine           [H]  1.5 mg/dL                   0.4-1.2   Calcium                   9.6 mg/dL                   8.4-10.5   GFR                  [L]  34.06 mL/min                >60.00  Hepatic/Liver Function Panel (HEPATIC)   Total Bilirubin           0.6 mg/dL                   0.3-1.2   Direct Bilirubin          0.1 mg/dL                   0.0-0.3   Alkaline Phosphatase [H]  135 U/L                     39-117   AST                       26 U/L                      0-37   ALT                       24 U/L                      0-35   Total Protein             6.7 g/dL                    6.0-8.3   Albumin                   4.0 g/dL                    3.5-5.2  CBC Platelet w/Diff (CBCD)   White Cell Count          6.7 K/uL                    4.5-10.5   Red Cell Count       [L]  3.68 Mil/uL                 3.87-5.11   Hemoglobin                12.2 g/dL                   12.0-15.0   Hematocrit           [L]  35.4 %                      36.0-46.0   MCV                       96.2 fl                     78.0-100.0   Platelet Count            186.0 K/uL                  150.0-400.0   Neutrophil %              68.6 %                      43.0-77.0   Lymphocyte %              23.1 %                      12.0-46.0   Monocyte %                6.6 %                       3.0-12.0   Eosinophils%              1.0 %  0.0-5.0   Basophils %               0.7 %                       0.0-3.0  Comments:      Lipid Panel (LIPID)   Cholesterol               168 mg/dL                   0-200   Triglycerides        [H]  184.0 mg/dL                 0.0-149.0   HDL                       50.40 mg/dL                 >39.00   LDL Cholesterol           81 mg/dL                    0-99   TSH (TSH)   FastTSH                   1.49 uIU/mL                 0.35-5.50   IBC Panel  (IBC)   Iron                      106 ug/dL                   42-145   Transferrin               219.2 mg/dL                 212.0-360.0   Iron Saturation           34.5 %                      20.0-50.0   Impression & Recommendations:  Problem # 1:  CEREBROVASCULAR DISEASE (ICD-437.9) Stable on ASA/ Ticlid w/o cerebral ischemic symptoms...   Problem # 2:  VENOUS INSUFFICIENCY, CHRONIC (ICD-459.81) Continue Lasix40...  Problem # 3:  HYPERCHOLESTEROLEMIA (ICD-272.0) FLP is reasonable on the Lip10... needs better low fat diet... Her updated medication list for this problem includes:    Lipitor 10 Mg Tabs (Atorvastatin calcium) ..... Once daily  Orders: TLB-BMP (Basic Metabolic Panel-BMET) (99991111) TLB-Hepatic/Liver Function Pnl (80076-HEPATIC) TLB-CBC Platelet - w/Differential (85025-CBCD) TLB-Lipid Panel (80061-LIPID) TLB-TSH (Thyroid Stimulating Hormone) (84443-TSH) TLB-IBC Pnl (Iron/FE;Transferrin) (83550-IBC)  Problem # 4:  RENAL INSUFFICIENCY (ICD-588.9) Creat sl improved w/ decr in Lasix dose>  keep same.  Problem # 5:  VITAMIN D DEFICIENCY (ICD-268.9) She is on Vit D 2000 u daily...  Problem # 6:  SENILE DEMENTIA (ICD-290.0) Aware> she is pleasantly demented...  Problem # 7:  PERNICIOUS ANEMIA (ICD-281.0) She had hx B12 defic on shot for yrs... also hx Fe defic but Fe= 106 today on oral Fe... contin same. Her updated medication list for this problem includes:    Ferrex 150 150 Mg Caps (Polysaccharide iron complex) .Marland Kitchen... Take 1 tablet by mouth once a day  Complete Medication List: 1)  Claritin 10 Mg Tabs (Loratadine) .Marland Kitchen.. 1 tab daily as needed for allergy symptoms.Marland KitchenMarland Kitchen 2)  Flonase 50 Mcg/act Susp (Fluticasone propionate) .Marland Kitchen.. 1-2 sprays in each nostril at bedtime as needed 3)  Adult Aspirin Low Strength 81 Mg Tbdp (Aspirin) .... Once daily 4)  Ticlid 250 Mg Tabs (Ticlopidine hcl) .... One tab twice daily 5)  Lasix 40 Mg Tabs (Furosemide) .Marland Kitchen.. 1  tablet every  morning 6)  Lipitor 10 Mg Tabs (Atorvastatin calcium) .... Once daily 7)  Zantac 75 75 Mg Tabs (Ranitidine hcl) .Marland Kitchen.. 1 tab by mouth at bedtime.Marland KitchenMarland Kitchen 8)  Multivitamins Tabs (Multiple vitamin) 9)  Vitamin D3 1000 Unit Caps (Cholecalciferol) .... Take 1 cap by mouth once daily... 10)  Ferrex 150 150 Mg Caps (Polysaccharide iron complex) .... Take 1 tablet by mouth once a day  Other Orders: Admin of Injection (IM/SQ) JY:[id number]) Vit B12 1000 mcg [id number])  Patient Instructions: 1)  Today we updated your med list- see below.... 2)  Continue your current meds the same... 3)  Today we did your follow up FASTING blood work... please call the "phone tree" in a few days for your lab results.Marland KitchenMarland Kitchen 4)  Remember> NO SALT or SODIUM.Marland KitchenMarland Kitchen 5)  Call for any problems.Marland KitchenMarland Kitchen 6)  Please schedule a follow-up appointment in 6 months.     Medication Administration  Injection # 1:    Medication: Vit B12 1000 mcg    Diagnosis: UNSPECIFIED ANEMIA (ICD-285.9)    Route: SQ    Site: L deltoid    Exp Date: 08/2011    Lot #: [id number]    Mfr: [place]    Comments: 1000MG  CHARGED J3420 AND 38756    Patient tolerated injection without complications    Given by: [facility]  Orders Added: 1)  Est. Patient Level IV [id number] 2)  TLB-BMP (Basic Metabolic Panel-BMET) 123456 3)  TLB-Hepatic/Liver Function Pnl [80076-HEPATIC] 4)  TLB-CBC Platelet - w/Differential [85025-CBCD] 5)  TLB-Lipid Panel [80061-LIPID] 6)  TLB-TSH (Thyroid Stimulating Hormone) [84443-TSH] 7)  TLB-IBC Pnl (Iron/FE;Transferrin) [83550-IBC] 8)  Admin of Injection (IM/SQ) [96372] 9)  Vit B12 1000 mcg [J3420]

## 2010-05-31 NOTE — Progress Notes (Signed)
Summary: FYI from pt.'s foot Dr. BP was high 05-18-2010  Phone Note Other Incoming   Caller: Pt. came in for B-12 shot today Details for Reason: BP was 199/? 05-18-2010 Summary of Call: Carrie Grimes saw her foot Dr. Ludwig Clarks.; her BP was 199/?. Her nor her son-in-law could remember what the bottom # was. Her foot Dr. ask her to tell her Dr. next time she was in the office. Neither one could remember his name either. Maybe her daughter Carrie Grimes will remember what the bottom # was or the Dr. name.  Initial call taken by: Alroy Bailiff,  May 21, 2010 11:18 AM  Follow-up for Phone Call        called and spoke with Carrie Grimes--pts daughter and she stated that the pt went to the foot doctor on friday and her BP was 199/something---unable to remember the bottome number---she stated that the pt has had a cold and been using the alarest, mucinex and cough syryp---she has been taking the same meds---last ov with SN was 12-19.  please advise. thanks Parkin  May 21, 2010 4:40 PM   Additional Follow-up for Phone Call Additional follow up Details #1::        per SN---stop the alarest---ok for pt to take the mucinex and cough syrup--delsym    monitor BP at home and call in a week or so with update on pts BP...called and spoke with Carrie Grimes and she is aware of SN recs and will call with any concerns Elita Boone CMA  May 21, 2010 5:05 PM

## 2010-06-18 ENCOUNTER — Encounter: Payer: Self-pay | Admitting: Pulmonary Disease

## 2010-06-18 ENCOUNTER — Ambulatory Visit (INDEPENDENT_AMBULATORY_CARE_PROVIDER_SITE_OTHER): Payer: MEDICARE

## 2010-06-18 DIAGNOSIS — D649 Anemia, unspecified: Secondary | ICD-10-CM

## 2010-06-26 ENCOUNTER — Telehealth (INDEPENDENT_AMBULATORY_CARE_PROVIDER_SITE_OTHER): Payer: Self-pay | Admitting: *Deleted

## 2010-06-26 NOTE — Assessment & Plan Note (Signed)
Summary: B-12 injection//jwr  Nurse Visit   Allergies: 1)  ! Novocain (Procaine Hcl)  Medication Administration  Injection # 1:    Medication: Vit B12 1000 mcg    Diagnosis: ANEMIA (R2654735.9)    Route: SQ    Site: L deltoid    Exp Date: 10/2011    Lot #: [id number]    Mfr: [place]    Comments: 1000MCG/CB    Given by: Alroy Bailiff IN [facility]   Orders Added: 1)  Vit B12 1000 mcg [J3420] 2)  Admin of Injection (IM/SQ) [id number]   Medication Administration  Injection # 1:    Medication: Vit B12 1000 mcg    Diagnosis: ANEMIA (R2654735.9)    Route: SQ    Site: L deltoid    Exp Date: 10/2011    Lot #: [id number]    Mfr: [place]    Comments: 1000MCG/CB    Given by: Alroy Bailiff IN [facility]   Orders Added: 1)  Vit B12 1000 mcg [J3420] 2)  Admin of Injection (IM/SQ) [id number]

## 2010-06-28 ENCOUNTER — Emergency Department (HOSPITAL_COMMUNITY): Payer: Medicare Other

## 2010-06-28 ENCOUNTER — Inpatient Hospital Stay (HOSPITAL_COMMUNITY)
Admission: EM | Admit: 2010-06-28 | Discharge: 2010-06-30 | DRG: 392 | Disposition: A | Discharge status: Home / Self Care | Payer: Medicare Other | Attending: Internal Medicine | Admitting: Internal Medicine

## 2010-06-28 ENCOUNTER — Telehealth: Payer: Self-pay | Admitting: Pulmonary Disease

## 2010-06-28 DIAGNOSIS — E860 Dehydration: Secondary | ICD-10-CM | POA: Diagnosis present

## 2010-06-28 DIAGNOSIS — E872 Acidosis, unspecified: Secondary | ICD-10-CM | POA: Diagnosis present

## 2010-06-28 DIAGNOSIS — E785 Hyperlipidemia, unspecified: Secondary | ICD-10-CM | POA: Diagnosis present

## 2010-06-28 DIAGNOSIS — A498 Other bacterial infections of unspecified site: Secondary | ICD-10-CM | POA: Diagnosis present

## 2010-06-28 DIAGNOSIS — N183 Chronic kidney disease, stage 3 unspecified: Secondary | ICD-10-CM | POA: Diagnosis present

## 2010-06-28 DIAGNOSIS — A088 Other specified intestinal infections: Principal | ICD-10-CM | POA: Diagnosis present

## 2010-06-28 DIAGNOSIS — N179 Acute kidney failure, unspecified: Secondary | ICD-10-CM | POA: Diagnosis present

## 2010-06-28 DIAGNOSIS — N390 Urinary tract infection, site not specified: Secondary | ICD-10-CM | POA: Diagnosis present

## 2010-06-28 DIAGNOSIS — Z66 Do not resuscitate: Secondary | ICD-10-CM | POA: Diagnosis present

## 2010-06-28 DIAGNOSIS — Z8673 Personal history of transient ischemic attack (TIA), and cerebral infarction without residual deficits: Secondary | ICD-10-CM

## 2010-06-28 DIAGNOSIS — F0390 Unspecified dementia without behavioral disturbance: Secondary | ICD-10-CM | POA: Diagnosis present

## 2010-06-28 DIAGNOSIS — K219 Gastro-esophageal reflux disease without esophagitis: Secondary | ICD-10-CM | POA: Diagnosis present

## 2010-06-28 DIAGNOSIS — I872 Venous insufficiency (chronic) (peripheral): Secondary | ICD-10-CM | POA: Diagnosis present

## 2010-06-28 DIAGNOSIS — E7800 Pure hypercholesterolemia, unspecified: Secondary | ICD-10-CM | POA: Diagnosis present

## 2010-06-28 DIAGNOSIS — Z7982 Long term (current) use of aspirin: Secondary | ICD-10-CM

## 2010-06-28 LAB — DIFFERENTIAL
Basophils Absolute: 0 10*3/uL (ref 0.0–0.1)
Basophils Relative: 0 % (ref 0–1)
Lymphocytes Relative: 20 % (ref 12–46)
Monocytes Absolute: 0.6 10*3/uL (ref 0.1–1.0)
Monocytes Relative: 8 % (ref 3–12)
Neutro Abs: 5.1 10*3/uL (ref 1.7–7.7)
Neutrophils Relative %: 71 % (ref 43–77)

## 2010-06-28 LAB — COMPREHENSIVE METABOLIC PANEL
ALT: 41 U/L — ABNORMAL HIGH (ref 0–35)
AST: 37 U/L (ref 0–37)
Alkaline Phosphatase: 145 U/L — ABNORMAL HIGH (ref 39–117)
CO2: 17 mEq/L — ABNORMAL LOW (ref 19–32)
Chloride: 116 mEq/L — ABNORMAL HIGH (ref 96–112)
GFR calc Af Amer: 28 mL/min — ABNORMAL LOW (ref >60)
GFR calc non Af Amer: 23 mL/min — ABNORMAL LOW (ref >60)
Glucose, Bld: 108 mg/dL — ABNORMAL HIGH (ref 70–99)
Sodium: 144 mEq/L (ref 135–145)
Total Bilirubin: 0.6 mg/dL (ref 0.3–1.2)

## 2010-06-28 LAB — CBC
HCT: 35.4 % — ABNORMAL LOW (ref 36.0–46.0)
Hemoglobin: 12 g/dL (ref 12.0–15.0)
MCHC: 33.9 g/dL (ref 30.0–36.0)
RBC: 3.8 MIL/uL — ABNORMAL LOW (ref 3.87–5.11)

## 2010-06-29 LAB — CBC
HCT: 30.8 % — ABNORMAL LOW (ref 36.0–46.0)
Hemoglobin: 10.4 g/dL — ABNORMAL LOW (ref 12.0–15.0)
MCHC: 33.8 g/dL (ref 30.0–36.0)
MCV: 93.1 fL (ref 78.0–100.0)

## 2010-06-29 LAB — DIFFERENTIAL
Basophils Absolute: 0 10*3/uL (ref 0.0–0.1)
Lymphocytes Relative: 30 % (ref 12–46)
Lymphs Abs: 1.5 10*3/uL (ref 0.7–4.0)
Monocytes Absolute: 0.6 10*3/uL (ref 0.1–1.0)
Neutro Abs: 3 10*3/uL (ref 1.7–7.7)

## 2010-06-29 LAB — URINE MICROSCOPIC-ADD ON

## 2010-06-29 LAB — BASIC METABOLIC PANEL
BUN: 34 mg/dL — ABNORMAL HIGH (ref 6–23)
CO2: 17 mEq/L — ABNORMAL LOW (ref 19–32)
Calcium: 8.7 mg/dL (ref 8.4–10.5)
Glucose, Bld: 102 mg/dL — ABNORMAL HIGH (ref 70–99)
Potassium: 3.6 mEq/L (ref 3.5–5.1)
Sodium: 144 mEq/L (ref 135–145)

## 2010-06-29 LAB — URINALYSIS, ROUTINE W REFLEX MICROSCOPIC
Ketones, ur: NEGATIVE mg/dL
Nitrite: NEGATIVE
Protein, ur: 30 mg/dL — AB
Urobilinogen, UA: 1 mg/dL (ref 0.0–1.0)

## 2010-06-29 LAB — INFLUENZA PANEL BY PCR (TYPE A & B): Influenza B By PCR: NEGATIVE

## 2010-06-30 LAB — BASIC METABOLIC PANEL
BUN: 27 mg/dL — ABNORMAL HIGH (ref 6–23)
CO2: 15 mEq/L — ABNORMAL LOW (ref 19–32)
Chloride: 124 mEq/L — ABNORMAL HIGH (ref 96–112)
Creatinine, Ser: 1.38 mg/dL — ABNORMAL HIGH (ref 0.4–1.2)

## 2010-06-30 LAB — URINE CULTURE
Colony Count: >=100000
Culture  Setup Time: 201203020407

## 2010-07-02 ENCOUNTER — Telehealth: Payer: Self-pay | Admitting: Pulmonary Disease

## 2010-07-05 ENCOUNTER — Other Ambulatory Visit: Payer: Medicare Other

## 2010-07-05 ENCOUNTER — Encounter: Payer: Self-pay | Admitting: Pulmonary Disease

## 2010-07-05 ENCOUNTER — Other Ambulatory Visit: Payer: Self-pay | Admitting: Pulmonary Disease

## 2010-07-05 ENCOUNTER — Inpatient Hospital Stay (INDEPENDENT_AMBULATORY_CARE_PROVIDER_SITE_OTHER): Payer: Medicare Other | Admitting: Pulmonary Disease

## 2010-07-05 DIAGNOSIS — N3000 Acute cystitis without hematuria: Secondary | ICD-10-CM

## 2010-07-05 DIAGNOSIS — N259 Disorder resulting from impaired renal tubular function, unspecified: Secondary | ICD-10-CM

## 2010-07-05 DIAGNOSIS — K5289 Other specified noninfective gastroenteritis and colitis: Secondary | ICD-10-CM

## 2010-07-05 DIAGNOSIS — I6350 Cerebral infarction due to unspecified occlusion or stenosis of unspecified cerebral artery: Secondary | ICD-10-CM

## 2010-07-05 DIAGNOSIS — F0390 Unspecified dementia without behavioral disturbance: Secondary | ICD-10-CM

## 2010-07-05 DIAGNOSIS — D649 Anemia, unspecified: Secondary | ICD-10-CM

## 2010-07-05 DIAGNOSIS — E860 Dehydration: Secondary | ICD-10-CM

## 2010-07-05 DIAGNOSIS — J309 Allergic rhinitis, unspecified: Secondary | ICD-10-CM

## 2010-07-05 DIAGNOSIS — K449 Diaphragmatic hernia without obstruction or gangrene: Secondary | ICD-10-CM

## 2010-07-05 LAB — BASIC METABOLIC PANEL
BUN: 30 mg/dL — ABNORMAL HIGH (ref 6–23)
Calcium: 9.8 mg/dL (ref 8.4–10.5)
Creatinine, Ser: 1.4 mg/dL — ABNORMAL HIGH (ref 0.4–1.2)
GFR: 39.33 mL/min — ABNORMAL LOW (ref >60.00)
Glucose, Bld: 101 mg/dL — ABNORMAL HIGH (ref 70–99)

## 2010-07-05 LAB — CBC WITH DIFFERENTIAL/PLATELET
Basophils Absolute: 0 10*3/uL (ref 0.0–0.1)
Lymphocytes Relative: 14.5 % (ref 12.0–46.0)
Lymphs Abs: 1.4 10*3/uL (ref 0.7–4.0)
Monocytes Relative: 6.6 % (ref 3.0–12.0)
Platelets: 218 10*3/uL (ref 150.0–400.0)
RDW: 13.5 % (ref 11.5–14.6)

## 2010-07-05 NOTE — Progress Notes (Signed)
Summary: stomach virus  Phone Note Call from Patient Call back at Home Phone [phone]   Caller: Daughter Sander Radon Call For: nadel Summary of Call: pt has a stomach virus per caller x 1 day. vomiting w/ diarrhea. no fever or chills. wants to know [facility] [place]  Initial call taken by: Cooper Render, CNA,  June 26, 2010 9:57 AM  Follow-up for Phone Call        PT having vomiting and diarrhea for 1 day.  Pt has not tried anything OTC because she didn'ts want to interfere with other meds.  Instructed caregiver that it is ok to take Immodium as diected on box.  Introduce ice chips and sips of Gatorade as tolerated.  If symptoms do not improve then call us back. Doroteo Glassman RN  June 26, 2010 11:27 AM

## 2010-07-05 NOTE — Progress Notes (Signed)
Summary: stomach virus-req to be seen today  Phone Note Call from Patient Call back at Home Phone [phone]   Caller: Daughter Sander Radon Call For: Inocente Krach Summary of Call: daughter states that pt is still vomiting and also still has diarrhea. daughter called w/ same complaints 2/28 and was told to call back if no better. daughter wants pt to be seen today. i advised her that i would get the nurse to call back shortley.  Initial call taken by: Cooper Render, CNA,  June 28, 2010 8:52 AM  Follow-up for Phone Call        Spoke with pt's daughter. She states that pt has had watery diarrhea and vomiting since 2/27.  Called on 2/28 and was asked to immodium and advance ice chips, gatorade as tolerated.  She had started to stop the vomiting 2 days ago, but this started back last night.  She states that she is still had some spite this am and did not vomit, but still has diarrhea.  She denies any fever, aches.  I advised that she may need to go to ED ? dehydration? Will forward to SN for recs.  Pls advise thanks and if rec ED, daughter wants to know which hosp SN wants her to go to  Daughter called again.Netta Neat  June 28, 2010 11:20 AM  Follow-up by: Tilden Dome,  June 28, 2010 10:02 AM  Additional Follow-up for Phone Call Additional follow up Details #1::        per SN---recs are for pt to be seen at the ER----called and spoke with pts daughter and she is aware to take pt to either [facility] or [facility] for eval Elita Boone CMA  June 28, 2010 11:59 AM     .

## 2010-07-09 NOTE — H&P (Signed)
Carrie Grimes, Carrie Grimes        ACCOUNT NO.:  [id number]  MEDICAL RECORD NO.:  [id number]           PATIENT TYPE:  E  LOCATION:  [facility]                         FACILITY:  [facility]  PHYSICIAN:  Cassell Smiles, MD    DATE OF BIRTH:  09/02/1920  DATE OF ADMISSION:  06/28/2010 DATE OF DISCHARGE:                             HISTORY & PHYSICAL   PRIMARY CARE PRACTITIONER:  Deborra Medina. Lenna Gilford, MD  CHIEF COMPLAINT:  Nausea, vomiting, and diarrhea for past 3 days.  HISTORY OF PRESENT ILLNESS:  The patient is an 77 year old very pleasant white female with a past medical history of CVA, status post right carotid endarterectomy, dyslipidemia, chronic venous insufficiency, and chronic kidney disease stage two, dyslipidemia with mild senile dementia and pernicious anemia comes in with the above-noted complaint.  Per the patient, she was in her usual state of health and for the past few days she has had a numerous episodes of nausea, vomiting, and diarrhea.  She did call her primary care practitioner few days ago and was told to take only clear liquids and advance slowly as tolerated.  Apparently, the patient had some broth yesterday and then again started having profuse nausea, vomiting, and diarrhea, therefore necessitating ER visit today. The patient denies any fever.  The patient denies any chest pain, shortness of breath, abdominal pain, headaches, or dysuria.  This history is also obtained in part from the patient's family, particularly her daughter who is at bedside.  ALLERGIES:  Novocaine.  PAST MEDICAL HISTORY: 1. CVA with very minimal left-sided deficits. 2. Status post right carotid endarterectomy in 1992. 3. History of allergic rhinitis. 4. History of hearing loss.  Hears better from her left ear. 5. Chronic venous insufficiency. 6. Hypercholesterolemia. 7. Hiatal hernia. 8. Chronic kidney disease stage II or III. 9. Vitamin deficiencies. 10.Senile dementia. 11.Pernicious  anemia.  PAST SURGICAL HISTORY: 1. Status post cholecystectomy in 1984. 2. Status post right carotid endarterectomy in 1992.  HOME MEDICATIONS: 1. Ticlopidine twice a day. 2. Aspirin 81 mg p.o. daily. 3. Lasix 40 mg p.o. daily. 4. Lipitor 10 mg 1 tablet p.o. daily. 5. Vitamin D 2000 units daily. 6. Vitamin B12 shots. 7. Ranitidine 75 mg p.o. daily.  FAMILY HISTORY:  Her mother and father both died of heart failure.  Both of her children have diabetes.  SOCIAL HISTORY:  The patient lives with her daughter and is independent of activities of daily living.  She denies any toxic habits.  REVIEW OF SYSTEMS:  A detailed review of 12 systems was done and these are negative except for the ones mentioned in the HPI.  PHYSICAL EXAMINATION:  VITAL SIGNS:  Temperature of 98.2, heart rate of 63, blood pressure 130/50, respiration of 18, and pulse ox of 99% on room air. HEENT: Atraumatic, normocephalic.  Pupils are equal and reactive to light and accommodation.  Oral mucosa reveals a dry tongue with no posterior pharyngeal wall erythema, exudate, or congestion. NECK:  Supple.  No JVD. CHEST:  Bilaterally clear to auscultation. CARDIOVASCULAR:  Heart sounds are regular.  She has a 3/6 systolic murmur best heard in the aortic area. ABDOMEN:  Soft.  Good bowel sounds.  The patient is slightly obese, however it is nontender and nondistended. EXTREMITIES:  She has chronic left-sided edema in the left lower extremity. NEUROLOGIC:  The patient is alert, awake, and has very, very minimal left-sided deficits.  LABORATORY DATA: 1. CBC shows a WBC of 7.2, hemoglobin of 12.0, hematocrit of 35.4, and     a platelet count of 179. 2. Chemistry shows a sodium of 144, potassium of 4.0, chloride of 116,     bicarb of 17, glucose of 108, BUN of 40, creatinine of 2.01. 3. LFT shows a total bilirubin of 0.6, alkaline phosphatase of 145,     AST of 37, ALT of 41, total protein of 7.1, and albumin of  3.8. 4. Calcium is 9.7.  RADIOLOGICAL STUDIES:  None done so far.  ASSESSMENT: 1. Acute renal failure likely secondary to gastrointestinal loss and     diuretic therapy. 2. Non gap metabolic acidosis secondary to acute renal failure. 3. Dehydration secondary to gastroenteritis. 4. Gastroenteritis, likely viral with no evidence of fever or     leukocytosis.  PLAN: 1. This patient will be admitted to regular floor. 2. She will be given supportive care in terms of IV fluids and     antiemetics. 3. We will start her on clear liquids and see how she does.  If she     tolerates that, we will advance as tolerated. 4. Once she is able to tolerate her p.o. intake, we will then resume     most of her oral medications, but for now we will keep them on hold     especially of her diuretics. 5. Further plan depends as the patient's clinical course evolves and     further radiological studies are available. 6. The patient will be followed closely and monitored on a day-to-day     basis. 7. DVT prophylaxis with heparin. 8. Code status.  The patient is do not resuscitate.  TOTAL TIME SPENT:  45 minutes.     Oren Binet, MD       SG/MEDQ  D:  06/28/2010  T:  06/28/2010  Job:  [id number]  cc:   Deborra Medina. Lenna Gilford, MD  Electronically Signed by Oren Binet  on 07/09/2010 03:04:39 PM

## 2010-07-10 NOTE — Assessment & Plan Note (Signed)
Summary: ok per [place]//jwr   CC:  3 month ROV & post hosp follow up....  History of Present Illness: 77 y/o WF here for a follow up visit... she has mult med problems including cerebrovasc dis & stroke w/ prev right CAE in 1992;  chr ven insuffic w/ edema;  Hyperchol;  large HH;  mild renal insuffic &  hx UTIs;  DJD & Vit D defic;  senile dementia;  anemia...   ~  November 28, 2009:  another fall at home- missed chair trying to sit down & hit metal FP screen w/ left side of face, left hand, left arm (no head trauma & no LOC> bruised, ecchymoses, abrasion. We discussed home PT/OT evaluation> "therapy" & safety assessment... They don't want AL/NH etc...  We discussed DNR/ NCB per pt & family request...   ~  April 16, 2010:  35mo ROV- doing satis w/o additional falls in the interim... daugh notes slowing down, ?weaker, less moblie, & she has finished the phys therapy... notes some sinus drainage & we discussed Claritin, Flonase, Saline, Mucinex... last OV we decr the Lasix from 80 to 40 due to Creat up to 1.6 (it is 1.5 today) & she has been doing satis & wt actually down 2# today... she denies cerebral ischemic symptoms on the ASA/ Ticlid;  chr swelling stable w/ Lasix40 now;  Lipids have been good on Lip10;  wants B12 shot today- OK.   ~  July 05, 2010:  She was Hosp at [facility] 3/1 - 06/30/10 w/ gastroent (n/v/d) & dehydration (creat 2.0 ==> 1.4 after hydration);  also had UTI w/ sens EColi (given Rocephin IV inhosp) and anemia (Hg=10-11 range)... here for f/u & improved> never had any urinary symptoms & we decided to treat outpt w/ Cipro to be sure it is resolved;  f/u labs today showed Creat=1.4 at baseline, Hg=11.5 improved... she has restarted her Lasix at 40mg  Qam >>    Current Meds:  ADULT ASPIRIN LOW STRENGTH 81 MG  TBDP (ASPIRIN) once daily TICLID 250 MG  TABS (TICLOPIDINE HCL) one tab twice daily LASIX 40 MG  TABS (FUROSEMIDE) 1  tablet every morning LIPITOR 10 MG  TABS (ATORVASTATIN CALCIUM)  once daily ZANTAC 75 75 MG  TABS (RANITIDINE HCL) 1 tab by mouth at bedtime... MULTIVITAMINS   TABS (MULTIPLE VITAMIN)  VITAMIN D3 1000 UNIT CAPS (CHOLECALCIFEROL) take 1 cap by mouth once daily... FERREX 150 150 MG  CAPS (POLYSACCHARIDE IRON COMPLEX) Take 1 tablet by mouth once a day CYANOCOBALAMIN 1000 MCG/ML SOLN (CYANOCOBALAMIN) one injection once monthly CIPRO 250 MG TABS (CIPROFLOXACIN HCL) take 1 tab by mouth two times a day til gone FLUTICASONE PROPIONATE 50 MCG/ACT SUSP (FLUTICASONE PROPIONATE) 2 sprays in each nostril two times a day for allergies...     Preventive Screening-Counseling & Management  Alcohol-Tobacco     Smoking Status: never  Allergies: 1)  ! Novocain (Procaine Hcl)  Comments:  Nurse/Medical Assistant: The patient's medications and allergies were reviewed with the patient and were updated in the Medication and Allergy Lists.  Past History:  Past Medical History: ALLERGIC RHINITIS (ICD-477.9) HEARING LOSS (ICD-389.9) DENTAL CARIES (ICD-521.00) CEREBROVASCULAR DISEASE (ICD-437.9) VENOUS INSUFFICIENCY, CHRONIC (ICD-459.81) HYPERCHOLESTEROLEMIA (ICD-272.0) HIATAL HERNIA (ICD-553.3) RENAL INSUFFICIENCY (ICD-588.9) UTI'S, HX OF (ICD-V13.00) VITAMIN D DEFICIENCY (ICD-268.9) Hx of STROKE (ICD-434.91) SENILE DEMENTIA (ICD-290.0) ANEMIA (ICD-285.9) PERNICIOUS ANEMIA (ICD-281.0)  Past Surgical History: S/P cholecystectomy - 1984 by DrBallen S/P Rt Carotid Endarterectormy by Sheryn Bison in 1992  Family History: Reviewed history from 10/08/2007  and no changes required. Father died from an MI Mother died from Valvular Heart Disease 6 Sibling - 3 Bro & 3 Sis  Social History: Reviewed history from 11/28/2009 and no changes required. Widow 2 Children  Review of Systems      See HPI       The patient complains of dyspnea on exertion, peripheral edema, and difficulty walking.  The patient denies anorexia, fever, weight loss, weight gain, vision loss,  decreased hearing, hoarseness, chest pain, syncope, prolonged cough, headaches, hemoptysis, abdominal pain, melena, hematochezia, severe indigestion/heartburn, hematuria, incontinence, muscle weakness, suspicious skin lesions, transient blindness, depression, unusual weight change, abnormal bleeding, enlarged lymph nodes, and angioedema.    Vital Signs:  Patient profile:   77 year old female Height:      64 inches Weight:      137.50 pounds BMI:     23.69 O2 Sat:      99 % on Room air Temp:     97.9 degrees F oral Pulse rate:   76 / minute BP sitting:   132 / 82  (left arm) Cuff size:   regular  Vitals Entered By: Elita Boone CMA (July 05, 2010 2:40 PM)  O2 Sat at Rest %:  99 O2 Flow:  Room air CC: 3 month ROV & post hosp follow up... Is Patient Diabetic? No Pain Assessment Patient in pain? no      Comments meds updated today with pt and her daughter   Physical Exam  Additional Exam:  WD WN 77 y/o WF in NAD...  GENERAL:  Alert & oriented x 2;  pleasant & cooperative... HEENT:  [place]/AT, EOM-wnl, PERRLA, Fundi-benign, EACs-clear, TMs-wnl, NOSE-clear, THROAT-clear but dentition is very poor. NECK:  Supple w/ adeq ROM; no JVD; carotid impulses OK w/o bruits, +CAE scar on Rt; no thyromegaly or nodules palpated; no lymphadenopathy. CHEST:  Clear to P & A; without wheezes/ rales/ or rhonchi. HEART:  Regular Rhythm; without murmurs/ rubs/ or gallops. ABDOMEN:  Soft & nontender; normal bowel sounds; no organomegaly or masses detected. EXT: without deformities or arthritic changes; she has VI changes w/ 2+edema on Lft & 1+edema on Rt NEURO:  CN's intact;  no focal neuro deficits... DERM:  No lesions noted; no rash- just the bruises & abrasion from fall.    Impression & Recommendations:  Problem # 1:  DEHYDRATION (ICD-276.51) Hosp records reviewed w/ pt & daughter & all questions answered... dehydration was secondary to the gastroenteritis & she was repleted IV... now back on prev  meds including Lasix40... Orders: TLB-BMP (Basic Metabolic Panel-BMET) (99991111) >> Creat=1.4 at baseline now. TLB-CBC Platelet - w/Differential (85025-CBCD) >> Hg= 11.5 improved...  Problem # 2:  ACUTE CYSTITIS (ICD-595.0) She ien RocephiV in hosp...ecidd to x w/ oral Cipro x5d as outpt... Her updated medication list for this problem includes:    Cipro 250 Mg Tabs (Ciprofloxacin hcl) .Marland Kitchen... Take 1 tab by mouth two times a day til gone  Problem # 3:  CEREBROVASCULAR DISEASE (ICD-437.9) Hx stroke & prev CAE in 1992>  she's been stable on ASA & Ticlid and has refused change in meds xyrs;  still content to continue this regimen & does not want to change meds.  Problem # 4:  VENOUS INSUFFICIENCY, CHRONIC (ICD-459.81) She follows a low sodium diet & she is back on the Lasix 40mg /d> continue same.  Problem # 5:  RENAL INSUFFICIENCY (ICD-588.9) Stable & back top baseline...  Problem # 6:  OTHER MEDICAL PROBLEMS AS NOTED>>>  Complete Medication List: 1)  Adult Aspirin Low Strength 81 Mg Tbdp (Aspirin) .... Once daily 2)  Ticlid 250 Mg Tabs (Ticlopidine hcl) .... One tab twice daily 3)  Lasix 40 Mg Tabs (Furosemide) .Marland Kitchen.. 1  tablet every morning 4)  Lipitor 10 Mg Tabs (Atorvastatin calcium) .... Once daily 5)  Zantac 75 75 Mg Tabs (Ranitidine hcl) .Marland Kitchen.. 1 tab by mouth at bedtime.Marland KitchenMarland Kitchen 6)  Multivitamins Tabs (Multiple vitamin) 7)  Vitamin D3 1000 Unit Caps (Cholecalciferol) .... Take 1 cap by mouth once daily.Marland KitchenMarland Kitchen 8)  Ferrex 150 150 Mg Caps (Polysaccharide iron complex) .... Take 1 tablet by mouth once a day 9)  Cyanocobalamin 1000 Mcg/ml Soln (Cyanocobalamin) .... One injection once monthly 10)  Cipro 250 Mg Tabs (Ciprofloxacin hcl) .... Take 1 tab by mouth two times a day til gone 11)  Fluticasone Propionate 50 Mcg/act Susp (Fluticasone propionate) .... 2 sprays in each nostril two times a day for allergies...  Patient Instructions: 1)  Today we updated your med list- see below.... 2)  We  refilled your [facility] for your allergies.Marland KitchenMarland Kitchen 3)  We also wrote a new perscription for CIPRO to take twice daily x5d for her UTI.Marland KitchenMarland Kitchen 4)  Drink plenty of fluids.Marland KitchenMarland Kitchen 5)  Call for any problems.Marland KitchenMarland Kitchen 6)  Keep your follow up appt in 3 months... Prescriptions: FLUTICASONE PROPIONATE 50 MCG/ACT SUSP (FLUTICASONE PROPIONATE) 2 sprays in each nostril two times a day for allergies...  #1 x prn   Entered and Authorized by:   Noralee Space MD   Signed by:   Noralee Space MD on 07/05/2010   Method used:   Print then Give to Patient   RxID:   [id number] CIPRO 250 MG TABS (CIPROFLOXACIN HCL) take 1 tab by mouth two times a day til gone  #10 x 0   Entered and Authorized by:   Noralee Space MD   Signed by:   Noralee Space MD on 07/05/2010   Method used:   Print then Give to Patient   RxID:   [id number]

## 2010-07-10 NOTE — Progress Notes (Signed)
Summary: f/u this wk w/ sn  Phone Note Call from Patient Call back at Home Phone [phone]   Caller: DaughterBrenda Blanch Grimes Call For: Carrie Grimes Summary of Call: pt needs to be seen this wk (told by dr in ER to see dr Olumide Dolinger this wk). caller says she needs this wk as she is on spring break and can not take time off next week as she has already missed work for her mom's care.  Initial call taken by: Carrie Render, CNA,  July 02, 2010 9:29 AM  Follow-up for Phone Call        ok to add pt on thursday afternoon at 2:30.  thanks Coal  July 02, 2010 9:39 AM   appointment confirmed with daughter appointment scheduled. Carrie Grimes CMA  July 02, 2010 1:10 PM

## 2010-07-16 ENCOUNTER — Encounter: Payer: Self-pay | Admitting: Pulmonary Disease

## 2010-07-16 ENCOUNTER — Ambulatory Visit (INDEPENDENT_AMBULATORY_CARE_PROVIDER_SITE_OTHER): Payer: Medicare Other

## 2010-07-16 DIAGNOSIS — D649 Anemia, unspecified: Secondary | ICD-10-CM

## 2010-07-26 NOTE — Assessment & Plan Note (Signed)
Summary: B-12 injection  Nurse Visit   Allergies: 1)  ! Novocain (Procaine Hcl)  Medication Administration  Injection # 1:    Medication: Vit B12 1000 mcg    Diagnosis: ANEMIA (N067566.9)    Route: SQ    Site: R deltoid    Exp Date: 10/2011    Lot #: [id number]    Mfr: [place]    Comments: 1000MCG    Given by: Brock Bad IN [facility]ERGY LAB  Orders Added: 1)  Vit B12 1000 mcg [J3420] 2)  Admin of Injection (IM/SQ) [id number]   Medication Administration  Injection # 1:    Medication: Vit B12 1000 mcg    Diagnosis: ANEMIA (N067566.9)    Route: SQ    Site: R deltoid    Exp Date: 10/2011    Lot #: [id number]    Mfr: [place]    Comments: 1000MCG    Given by: Brock Bad IN ALLERGY LAB  Orders Added: 1)  Vit B12 1000 mcg [J3420] 2)  Admin of Injection (IM/SQ) [id number]

## 2010-07-27 NOTE — Discharge Summary (Signed)
  NAMEAIREN, Carrie Grimes        ACCOUNT NO.:  [id number]  MEDICAL RECORD NO.:  [id number]           PATIENT TYPE:  I  LOCATION:  5526                         FACILITY:  [facility]  PHYSICIAN:  Domenic Polite, MD     DATE OF BIRTH:  [birth year]  DATE OF ADMISSION:  06/28/2010 DATE OF DISCHARGE:  06/30/2010                              DISCHARGE SUMMARY   DISCHARGE DIAGNOSES: 1. Acute viral gastroenteritis, improved. 2. History of Escherichia coli urinary tract infection. 3. Acute renal failure secondary to volume depletion, improved. 4. Dehydration, improving. 5. History of cerebrovascular accident. 6. History of senile dementia. 7. Chronic kidney disease stage II. 8. Hiatal hernia. 9. Dyslipidemia. 10.Chronic venous insufficiency. 11.History of hearing loss. 12.History of allergic rhinitis. 13.History of right carotid endarterectomy in 1992. 14.History of cholecystectomy.  DISCHARGE MEDICATIONS: 1. Tylenol 650 mg q.4 h. p.r.n. 2. Aspirin enteric-coated 81 mg daily. 3. Lasix 40 mg daily. 4. Lipitor 10 mg daily. 5. Multivitamin 1 tablet daily. 6. Ranitidine 75 mg daily. 7. Ticlopidine 250 mg p.o. b.i.d.  DIAGNOSTICS INVESTIGATIONS:  KUB, June 28, 2010, nonobstructive bowel gas pattern, hiatal hernia.  HOSPITAL COURSE:  [name] is an 77 year old pleasant Caucasian female with history of senile dementia, presented to the hospital with nausea, vomiting, and diarrhea for 3 days.  On initial evaluation, she was found to be profoundly dehydrated with worsening of her creatinine. She is felt to have viral gastroenteritis based on the fact that she was afebrile as well as to not have leukocytosis.  She was admitted for symptomatic management with IV fluids, antiemetics, started on clear liquid diet, which was advanced to a regular diet.  Subsequently, she had good clinical improvement in her symptoms and she was restarted on her regular home medications by day 3 including  furosemide and discharged home in a stable condition on June 30, 2010.  DISCHARGE CONDITION:  Stable.  Vital signs at the time of discharge, temperature 98.1, pulse 64, blood pressure 170/65, respirations 18, sating 96% on room air.  DISCHARGE LABS:  BMET:  Sodium 144, potassium 3.7, chloride 124, bicarb 15, BUN 27, creatinine 1.3, glucose 110.  DISCHARGE FOLLOWUP:  Primary physician Dr. Lenna Gilford in 1 week.     Domenic Polite, MD     PJ/MEDQ  D:  07/27/2010  T:  07/27/2010  Job:  [id number]  Electronically Signed by Domenic Polite  on 07/27/2010 05:54:02 PM

## 2010-08-20 ENCOUNTER — Ambulatory Visit (INDEPENDENT_AMBULATORY_CARE_PROVIDER_SITE_OTHER): Payer: Medicare Other

## 2010-08-20 DIAGNOSIS — D649 Anemia, unspecified: Secondary | ICD-10-CM

## 2010-08-20 MED ORDER — CYANOCOBALAMIN 1000 MCG/ML IJ SOLN
1000.0000 ug | Freq: Once | INTRAMUSCULAR | Status: AC
  Administered 2010-08-20: 1000 ug via INTRAMUSCULAR

## 2010-09-17 ENCOUNTER — Ambulatory Visit (INDEPENDENT_AMBULATORY_CARE_PROVIDER_SITE_OTHER): Payer: Medicare Other

## 2010-09-17 DIAGNOSIS — D649 Anemia, unspecified: Secondary | ICD-10-CM

## 2010-09-18 MED ORDER — CYANOCOBALAMIN 1000 MCG/ML IJ SOLN
1000.0000 ug | Freq: Once | INTRAMUSCULAR | Status: AC
  Administered 2010-09-18: 1000 ug via INTRAMUSCULAR

## 2010-10-22 ENCOUNTER — Encounter: Payer: Self-pay | Admitting: Pulmonary Disease

## 2010-10-23 ENCOUNTER — Ambulatory Visit (INDEPENDENT_AMBULATORY_CARE_PROVIDER_SITE_OTHER): Payer: Medicare Other | Admitting: Pulmonary Disease

## 2010-10-23 ENCOUNTER — Ambulatory Visit (INDEPENDENT_AMBULATORY_CARE_PROVIDER_SITE_OTHER): Payer: Medicare Other

## 2010-10-23 ENCOUNTER — Encounter: Payer: Self-pay | Admitting: Pulmonary Disease

## 2010-10-23 DIAGNOSIS — E559 Vitamin D deficiency, unspecified: Secondary | ICD-10-CM

## 2010-10-23 DIAGNOSIS — H9190 Unspecified hearing loss, unspecified ear: Secondary | ICD-10-CM

## 2010-10-23 DIAGNOSIS — I872 Venous insufficiency (chronic) (peripheral): Secondary | ICD-10-CM

## 2010-10-23 DIAGNOSIS — I6350 Cerebral infarction due to unspecified occlusion or stenosis of unspecified cerebral artery: Secondary | ICD-10-CM

## 2010-10-23 DIAGNOSIS — D649 Anemia, unspecified: Secondary | ICD-10-CM

## 2010-10-23 DIAGNOSIS — I679 Cerebrovascular disease, unspecified: Secondary | ICD-10-CM

## 2010-10-23 DIAGNOSIS — N259 Disorder resulting from impaired renal tubular function, unspecified: Secondary | ICD-10-CM

## 2010-10-23 DIAGNOSIS — F0390 Unspecified dementia without behavioral disturbance: Secondary | ICD-10-CM

## 2010-10-23 DIAGNOSIS — M1990 Unspecified osteoarthritis, unspecified site: Secondary | ICD-10-CM

## 2010-10-23 DIAGNOSIS — R609 Edema, unspecified: Secondary | ICD-10-CM

## 2010-10-23 DIAGNOSIS — K449 Diaphragmatic hernia without obstruction or gangrene: Secondary | ICD-10-CM

## 2010-10-23 DIAGNOSIS — D510 Vitamin B12 deficiency anemia due to intrinsic factor deficiency: Secondary | ICD-10-CM

## 2010-10-23 DIAGNOSIS — E7800 Pure hypercholesterolemia, unspecified: Secondary | ICD-10-CM

## 2010-10-23 MED ORDER — CYANOCOBALAMIN 1000 MCG/ML IJ SOLN
1000.0000 ug | Freq: Once | INTRAMUSCULAR | Status: AC
  Administered 2010-10-23: 1000 ug via INTRAMUSCULAR

## 2010-10-23 NOTE — Patient Instructions (Signed)
Today we updated your med list in our EPIC system>    Continue your current meds the same...  Remember:  NO SALT or SODIUM!!! This is to help your swelling...  Try some support hose to help decrease the swelling in your legs...  Call for any questions...  Let's plan a follow up visit in 4 months w/ fasting blood work at that time.Marland KitchenMarland Kitchen

## 2010-10-23 NOTE — Progress Notes (Signed)
Subjective:    Patient ID: Carrie Grimes, female    DOB: 17-May-1920, 77 y.o.   MRN: [id number]  HPI 77 y/o WF here for a follow up visit... she has mult med problems including cerebrovasc dis & stroke w/ prev right CAE in 1992;  chr ven insuffic w/ edema;  Hyperchol;  large HH;  mild renal insuffic &  hx UTIs;  DJD & Vit D defic;  senile dementia;  anemia...  ~  November 28, 2009:  another fall at home- missed chair trying to sit down & hit metal FP screen w/ left side of face, left hand, left arm (no head trauma & no LOC> bruised, ecchymoses, abrasion. We discussed home PT/OT evaluation> "therapy" & safety assessment... They don't want AL/NH etc...  We discussed DNR/ NCB per pt & family request...  ~  April 16, 2010:  3mo ROV- doing satis w/o additional falls in the interim... daugh notes slowing down, ?weaker, less moblie, & she has finished the phys therapy... notes some sinus drainage & we discussed Claritin, Flonase, Saline, Mucinex... last OV we decr the Lasix from 80 to 40 due to Creat up to 1.6 (it is 1.5 today) & she has been doing satis & wt actually down 2# today... she denies cerebral ischemic symptoms on the ASA/ Ticlid;  chr swelling stable w/ Lasix40 now;  Lipids have been good on Lip10;  wants B12 shot today- OK.  ~  July 05, 2010:  She was Hosp at [facility] 3/1 - 06/30/10 w/ gastroent (n/v/d) & dehydration (creat 2.0 ==> 1.4 after hydration);  also had UTI w/ sens EColi (given Rocephin IV inhosp) and anemia (Hg=10-11 range)... here for f/u & improved> never had any urinary symptoms & we decided to treat outpt w/ Cipro to be sure it is resolved;  f/u labs today showed Creat=1.4 at baseline, Hg=11.5 improved... she has restarted her Lasix at 40mg  Qam >>  ~  October 23, 2010:  39mo ROV & [name] has no complaints as usual, cheerful, laughing, feeling OK, etc;  Exam shows VI & 2+edema & we discussed taking Lasix40 every day, no salt, elevation, support hose, etc;  We reviewed prev XRays &  labs from 3/12 [place]...   Problem List:    ALLERGIC RHINITIS (ICD-477.9) - she uses Claritin & Flonase as needed... she has 2 cats- Heidi & Aline Brochure!  HEARING LOSS (ICD-389.9) - she doesn't have hearing aides- "it wouldn't work"...  DENTAL CARIES (ICD-521.00) - she has been repeatedly reminded to seek dental help, but refuses... family purees her foods...  CEREBROVASCULAR DISEASE (ICD-437.9) - s/p right CAE by [facility] 1992... stable on ASA 81mg /d and TICLID 250mg Bid... she refuses repeat/ f/u CDopplers, etc... she has been doing well and remains asymptomatic- denies focal weakness, numbness, paresthesia, pain, slurred speech, headache, dizziness, tremor, or seizure.  VENOUS INSUFFICIENCY, CHRONIC (ICD-459.81) - she has chronic LE edema- on LASIX 40mg /d now and low sodium diet... she had gained 23# to 152# in 2009 w/ Ensure & puree diet from famiy, now down to 142#... advises on low sodium, elevate legs, support hose... ~  6/11:  BUN=40, Creat=1.6 on the Lasix80 & asked to decr to 40mg /d and restrict sodium. ~  12/11:  labs showed BUN=41, Creat=1.5 on Lasix40... keep same for now.  HYPERCHOLESTEROLEMIA (ICD-272.0) - on diet + LIPITOR 10mg /d... her TG's have been elevated but she refuses more meds. ~  labs 3/08 showed TChol 183, TG 205, HDL 54, LDL 98 ~  labs 6/09 showed TChol  190, TG 314, HDL 43, LDL 94... she refuses more meds, rec low fat diet. ~  labs 6/10 showed TChol 184, TG 279, HDL 50, LDL 97 ~  labs 6/11 showed TChol 180, TG 277, HDL 49, LDL 95 ~  FLP 12/11 showed TChol 168. TH 184, HDL50, LDL 81  HIATAL HERNIA (ICD-553.3) - large HH seen on prev CT Abd in 2001... takes ZANTAC 75mg  Qhs...  RENAL INSUFFICIENCY (ICD-588.9) - Creat= 1.5 to 1.7 in 2008... ~  labs 6/09 showed BUN= 33, Creat= 1.4 ~  labs 6/10 showed BUN= 36, Creat= 1.5 ~  labs 12/10 showed BUN= 33, Creat= 1.4 ~  labs 6/11 showed BUN=40, Creat=1.6.Marland KitchenMarland Kitchen rec to decr Lasix from 80 to 40mg /d. ~  labs 12/11 showed BUN=41,  creat=1.5 ~  Labs 3/12 Hosp showed Creat 2.01 ==> 1.38  UTI'S, HX OF (ICD-V13.00)  VITAMIN D DEFICIENCY (ICD-268.9) - she is likely osteoporotic but unmeasured due to refusal of BMD & Bisphos therapy options... ~  labs 6/09 showed Vit D level = 26... rec to take OTC Vit D 1000 u daily... ~  labs 6/10 showed Vit D level = 24... rec to incr Vit D to 2000 u daily.  Hx of STROKE (ICD-434.91) - hx right brain stroke in 1992 w/ CTBrain showing right post temporal & parietal low density area w/ encephalomalacia...  SENILE DEMENTIA (ICD-290.0)  ANEMIA (ICD-285.9) & PERNICIOUS ANEMIA (ICD-281.0) - on monthly B12 shots for years... in 2006 she was also iron deficient and received IV iron therapy (she refused GI eval)... on FeSO4 daily... she feels well and denies weakness, fatigue, bleeding, dark stools, etc... ~  labs 3/08 showed Hg= 13.2 ~  labs 6/09 showed Hg= 12.7 ~  labs 6/10 showed Hg= 11.7... Fe, B12, Folate were all WNL. ~  labs 12/10 showed Hg= 12.4 ~  labs 12/11 showed Hg= 12.2, Fe=106 (34%sat)... ~  Labs 3/12 Hosp showed Hg= 12.2 ==> 10.4  NOTE>> She has repeatedly refused GYN exams, PAP smears, Mammograms, & Colonoscopy...   Past Surgical History  Procedure Date  . Cholecystectomy   . Carotid endarterectomy     Outpatient Encounter Prescriptions as of 10/23/2010  Medication Sig Dispense Refill  . acetaminophen (TYLENOL) 500 MG tablet Take 500 mg by mouth every 6 (six) hours as needed.        Marland Kitchen aspirin 81 MG tablet Take 81 mg by mouth daily.        Marland Kitchen atorvastatin (LIPITOR) 10 MG tablet Take 10 mg by mouth daily.        . Cholecalciferol (EQL VITAMIN D3) 1000 UNITS tablet Take 1,000 Units by mouth daily.        . cyanocobalamin (,VITAMIN B-12,) 1000 MCG/ML injection Inject 1,000 mcg into the muscle every 30 (thirty) days.        . fluticasone (VERAMYST) 27.5 MCG/SPRAY nasal spray Place 2 sprays into the nose daily.        . furosemide (LASIX) 40 MG tablet Take 40 mg by mouth  daily.        . iron polysaccharides (NIFEREX) 150 MG capsule Take 150 mg by mouth daily.        . Multiple Vitamin (MULTIVITAMIN) capsule Take 1 capsule by mouth daily.        . ranitidine (ZANTAC) 75 MG tablet Take 75 mg by mouth at bedtime.        . ticlopidine (TICLID) 250 MG tablet Take 250 mg by mouth 2 (two) times daily.  Allergies  Allergen Reactions  . Procaine Hcl     REACTION: pt states reaction to shot at dentist office w/ tachycardia \\T \ SOB (? if really from combination w/epi)    Review of Systems       See HPI - all other systems neg except as noted...      The patient complains of decreased hearing, dyspnea on exertion, and difficulty walking.  The patient denies anorexia, fever, weight loss, weight gain, vision loss, hoarseness, chest pain, syncope, peripheral edema, prolonged cough, headaches, hemoptysis, abdominal pain, melena, hematochezia, severe indigestion/heartburn, hematuria, incontinence, muscle weakness, suspicious skin lesions, transient blindness, depression, unusual weight change, abnormal bleeding, enlarged lymph nodes, and angioedema.     Objective:   Physical Exam     WD WN 78 y/o WF in NAD...  GENERAL:  Alert & oriented x 2;  pleasant & cooperative... HEENT:  [place]/AT, EOM-wnl, PERRLA, Fundi-benign, EACs-clear, TMs-wnl, NOSE-clear, THROAT-clear but dentition is very poor. NECK:  Supple w/ adeq ROM; no JVD; carotid impulses OK w/o bruits, +CAE scar on Rt; no thyromegaly or nodules palpated; no lymphadenopathy. CHEST:  Clear to P & A; without wheezes/ rales/ or rhonchi. HEART:  Regular Rhythm; without murmurs/ rubs/ or gallops. ABDOMEN:  Soft & nontender; normal bowel sounds; no organomegaly or masses detected. EXT: without deformities or arthritic changes; she has VI changes w/ 2+edema on Lft & 1+edema on Rt NEURO:  CN's intact;  no focal neuro deficits... DERM:  No lesions noted; no rash- just the bruises & abrasion from fall.   Assessment &  Plan:   Cerebrovasc Dis>  On the ASA Ticlid & she does not want to change this med; continue same...  VI. Edema>  We discussed the need to take the Lasix 40mg  every day, avoid saly, elev legs, wear support hose...  CHOL>  On Lip10 & we will f/u FLP on return...  HH/ GERD>  She takes Zantac 150mg /d & doing quite well, no symptoms she says...  Renal Insuffic>  Creat is stable & she knows to stay well hydrated & avoid dehydration...  Hx Stroke & Dementia>  On ASA & Ticlid & she doesn't want to change Rx; continue same & they decline meds Rx.  Anemia & B12 Defic>  On monthly B12 shots & Fe daily;  Continue same & f/u labs on return.Marland KitchenMarland Kitchen

## 2010-11-02 ENCOUNTER — Other Ambulatory Visit: Payer: Self-pay | Admitting: Pulmonary Disease

## 2010-11-03 ENCOUNTER — Other Ambulatory Visit: Payer: Self-pay | Admitting: Pulmonary Disease

## 2010-11-07 ENCOUNTER — Encounter: Payer: Self-pay | Admitting: Pulmonary Disease

## 2010-11-27 ENCOUNTER — Ambulatory Visit (INDEPENDENT_AMBULATORY_CARE_PROVIDER_SITE_OTHER): Payer: Medicare Other

## 2010-11-27 DIAGNOSIS — D649 Anemia, unspecified: Secondary | ICD-10-CM

## 2010-11-27 MED ORDER — CYANOCOBALAMIN 1000 MCG/ML IJ SOLN
1000.0000 ug | Freq: Once | INTRAMUSCULAR | Status: AC
  Administered 2010-11-27: 1000 ug via INTRAMUSCULAR

## 2010-12-01 ENCOUNTER — Other Ambulatory Visit: Payer: Self-pay | Admitting: Pulmonary Disease

## 2010-12-27 ENCOUNTER — Ambulatory Visit (INDEPENDENT_AMBULATORY_CARE_PROVIDER_SITE_OTHER): Payer: Medicare Other

## 2010-12-27 DIAGNOSIS — Z23 Encounter for immunization: Secondary | ICD-10-CM

## 2010-12-27 DIAGNOSIS — D510 Vitamin B12 deficiency anemia due to intrinsic factor deficiency: Secondary | ICD-10-CM

## 2010-12-28 DIAGNOSIS — D510 Vitamin B12 deficiency anemia due to intrinsic factor deficiency: Secondary | ICD-10-CM

## 2010-12-28 MED ORDER — CYANOCOBALAMIN 1000 MCG/ML IJ SOLN
1000.0000 ug | Freq: Once | INTRAMUSCULAR | Status: AC
  Administered 2010-12-28: 1000 ug via INTRAMUSCULAR

## 2011-01-29 ENCOUNTER — Other Ambulatory Visit: Payer: Self-pay | Admitting: Pulmonary Disease

## 2011-01-29 ENCOUNTER — Ambulatory Visit (INDEPENDENT_AMBULATORY_CARE_PROVIDER_SITE_OTHER): Payer: Medicare Other | Admitting: Pulmonary Disease

## 2011-01-29 ENCOUNTER — Other Ambulatory Visit (INDEPENDENT_AMBULATORY_CARE_PROVIDER_SITE_OTHER): Payer: Medicare Other

## 2011-01-29 ENCOUNTER — Encounter: Payer: Self-pay | Admitting: Pulmonary Disease

## 2011-01-29 ENCOUNTER — Ambulatory Visit (INDEPENDENT_AMBULATORY_CARE_PROVIDER_SITE_OTHER): Payer: Medicare Other

## 2011-01-29 DIAGNOSIS — D510 Vitamin B12 deficiency anemia due to intrinsic factor deficiency: Secondary | ICD-10-CM

## 2011-01-29 DIAGNOSIS — I6350 Cerebral infarction due to unspecified occlusion or stenosis of unspecified cerebral artery: Secondary | ICD-10-CM

## 2011-01-29 DIAGNOSIS — N259 Disorder resulting from impaired renal tubular function, unspecified: Secondary | ICD-10-CM

## 2011-01-29 DIAGNOSIS — I872 Venous insufficiency (chronic) (peripheral): Secondary | ICD-10-CM

## 2011-01-29 DIAGNOSIS — F419 Anxiety disorder, unspecified: Secondary | ICD-10-CM

## 2011-01-29 DIAGNOSIS — I679 Cerebrovascular disease, unspecified: Secondary | ICD-10-CM

## 2011-01-29 DIAGNOSIS — F411 Generalized anxiety disorder: Secondary | ICD-10-CM

## 2011-01-29 DIAGNOSIS — E559 Vitamin D deficiency, unspecified: Secondary | ICD-10-CM

## 2011-01-29 DIAGNOSIS — D649 Anemia, unspecified: Secondary | ICD-10-CM

## 2011-01-29 DIAGNOSIS — E7800 Pure hypercholesterolemia, unspecified: Secondary | ICD-10-CM

## 2011-01-29 DIAGNOSIS — F0390 Unspecified dementia without behavioral disturbance: Secondary | ICD-10-CM

## 2011-01-29 LAB — CBC WITH DIFFERENTIAL/PLATELET
Eosinophils Relative: 0.3 % (ref 0.0–5.0)
Lymphocytes Relative: 20.3 % (ref 12.0–46.0)
Monocytes Relative: 6.9 % (ref 3.0–12.0)
Neutrophils Relative %: 71.9 % (ref 43.0–77.0)
Platelets: 195 10*3/uL (ref 150.0–400.0)
WBC: 7 10*3/uL (ref 4.5–10.5)

## 2011-01-29 LAB — LDL CHOLESTEROL, DIRECT: Direct LDL: 74.7 mg/dL

## 2011-01-29 LAB — HEPATIC FUNCTION PANEL
ALT: 39 U/L — ABNORMAL HIGH (ref 0–35)
AST: 28 U/L (ref 0–37)
Albumin: 3.8 g/dL (ref 3.5–5.2)

## 2011-01-29 LAB — BASIC METABOLIC PANEL
BUN: 39 mg/dL — ABNORMAL HIGH (ref 6–23)
Chloride: 111 mEq/L (ref 96–112)
GFR: 27.14 mL/min — ABNORMAL LOW (ref >60.00)
Glucose, Bld: 107 mg/dL — ABNORMAL HIGH (ref 70–99)
Potassium: 5.5 mEq/L — ABNORMAL HIGH (ref 3.5–5.1)

## 2011-01-29 LAB — TSH: TSH: 1.54 u[IU]/mL (ref 0.35–5.50)

## 2011-01-29 LAB — LIPID PANEL: HDL: 44.4 mg/dL (ref >39.00)

## 2011-01-29 MED ORDER — CYANOCOBALAMIN 1000 MCG/ML IJ SOLN
1000.0000 ug | Freq: Once | INTRAMUSCULAR | Status: AC
  Administered 2011-01-29: 1000 ug via INTRAMUSCULAR

## 2011-01-29 NOTE — Patient Instructions (Signed)
Today we updated your labs in EPIC...    Continue your current meds the same...  Today we did your follow up fasting blood work...    Please call the [facility] in a few days for your results...    Dial [id number] & when prompted enter your patient number followed by the # symbol...    Your patient number is:  [id number]  Call for any questions...  Let's plan a follow up visit in about 4 months.Marland KitchenMarland Kitchen

## 2011-01-29 NOTE — Progress Notes (Signed)
Subjective:    Patient ID: Carrie Grimes, female    DOB: 1920-05-21, 77 y.o.   MRN: [id number]  HPI 77 y/o WF here for a follow up visit... she has mult med problems including cerebrovasc dis & stroke w/ prev right CAE in 1992;  chr ven insuffic w/ edema;  Hyperchol;  large HH;  mild renal insuffic &  hx UTIs;  DJD & Vit D defic;  senile dementia;  anemia...  ~  July 05, 2010:  She was Hosp at [facility] 3/1 - 06/30/10 w/ gastroent (n/v/d) & dehydration (creat 2.0 ==> 1.4 after hydration);  also had UTI w/ sens EColi (given Rocephin IV inhosp) and anemia (Hg=10-11 range)... here for f/u & improved> never had any urinary symptoms & we decided to treat outpt w/ Cipro to be sure it is resolved;  f/u labs today showed Creat=1.4 at baseline, Hg=11.5 improved... she has restarted her Lasix at 40mg  Qam >>  ~  October 23, 2010:  61mo ROV & Carrie Grimes has no complaints as usual, cheerful, laughing, feeling OK, etc;  Exam shows VI & 2+edema & we discussed taking Lasix40 every day, no salt, elevation, support hose, etc;  We reviewed prev XRays & labs from 3/12 [place]...  ~  January 29, 2011:  56mo ROV     Cerebrovasc dis, Hx Stroke>  On ASA & Ticlid; she has been stable, in good spirits, w/o cerebral ischemic symptoms on these meds...    VI/ Edema>  On low sodium diet & Lasix40; wt is stable at 140# w/ VI changes and trace-1+ edema in LEs...    Chol>  On Lip10; FLP today showed TChol 151, TG 240, HDL 44, LDL 75; continue same med & better low fat diet!    HH>  On Antireflux regimen & Ranitadine; she has large HH seen on prev CT scan; denies CP, Abd pain, N/ V, dysphagia etc...    Renal Insuffic>  Baseline Creat= 1.4-1.5; labs today showed BUN=39, Creat=1.9; rec to decr Lasix40 to 1/2 tab daily & incr po fluids.    Vit D Defic>  On MVI + VitD 2000u daily; encouraged to take the vit D every day...    Senile dementia>  She is pleasantly demented & cared for beautifully by her family...    Hx Anemia>  On MVI + Niferex;  labs showed Hg= 11.3, MCV=96; rec to continue her Fe, VitC & supplements...    Vit B12 Defic>  On B12 shots monthly...       Problem List:    ALLERGIC RHINITIS (ICD-477.9) - she uses Claritin & Flonase as needed... she has 2 cats- Heidi & Aline Brochure!  HEARING LOSS (ICD-389.9) - she doesn't have hearing aides- "it wouldn't work"...  DENTAL CARIES (ICD-521.00) - she has been repeatedly reminded to seek dental help, but refuses... family purees her foods...  CEREBROVASCULAR DISEASE (ICD-437.9) - s/p right CAE by [facility] 1992... stable on ASA 81mg /d and TICLID 250mg Bid... she refuses repeat/ f/u CDopplers, etc... she has been doing well and remains asymptomatic- denies focal weakness, numbness, paresthesia, pain, slurred speech, headache, dizziness, tremor, or seizure.  VENOUS INSUFFICIENCY, CHRONIC (ICD-459.81) - she has chronic LE edema- on LASIX 40mg /d now and low sodium diet... she had gained 23# to 152# in 2009 w/ Ensure & puree diet from famiy, now down to 140#... advises on low sodium, elevate legs, support hose... ~  6/11:  BUN=40, Creat=1.6 on the Lasix80 & asked to decr to 40mg /d and restrict sodium. ~  12/11:  labs showed BUN=41, Creat=1.5 on Lasix40... Baseline Creat ~1.4-1.5 ~  10/12:  Labs showed BUN=39, Creat=1.9.Marland KitchenMarland Kitchen rec to decr lasix further to 1/2 tab daily...  HYPERCHOLESTEROLEMIA (ICD-272.0) - on diet + LIPITOR 10mg /d... her TG's have been elevated but she refuses more meds. ~  labs 3/08 showed TChol 183, TG 205, HDL 54, LDL 98 ~  labs 6/09 showed TChol 190, TG 314, HDL 43, LDL 94... she refuses more meds, rec low fat diet. ~  labs 6/10 showed TChol 184, TG 279, HDL 50, LDL 97 ~  labs 6/11 showed TChol 180, TG 277, HDL 49, LDL 95 ~  FLP 12/11 showed TChol 168. TH 184, HDL50, LDL 81  HIATAL HERNIA (ICD-553.3) - large HH seen on prev CT Abd in 2001... takes ZANTAC 75mg  Qhs...  RENAL INSUFFICIENCY (ICD-588.9) - Creat= 1.5 to 1.7 in 2008... ~  labs 6/09 showed BUN= 33, Creat=  1.4 ~  labs 6/10 showed BUN= 36, Creat= 1.5 ~  labs 12/10 showed BUN= 33, Creat= 1.4 ~  labs 6/11 showed BUN=40, Creat=1.6.Marland KitchenMarland Kitchen rec to decr Lasix from 80 to 40mg /d. ~  labs 12/11 showed BUN=41, creat=1.5 ~  Labs 3/12 Hosp showed Creat 2.01 ==> 1.38 ~  Labs 10/12 showed BUN=39, Creat=1.9 & we rec that she decr Lasix to 20mg /d.  UTI'S, HX OF (ICD-V13.00)  VITAMIN D DEFICIENCY (ICD-268.9) - she is likely osteoporotic but unmeasured due to refusal of BMD & Bisphos therapy options... ~  labs 6/09 showed Vit D level = 26... rec to take OTC Vit D 1000 u daily... ~  labs 6/10 showed Vit D level = 24... rec to incr Vit D to 2000 u daily.  Hx of STROKE (ICD-434.91) - hx right brain stroke in 1992 w/ CTBrain showing right post temporal & parietal low density area w/ encephalomalacia... ~  She remains on ASA 81mg /d & TICLID 250mg Bid w/o acute cerebral ischemic symptoms...  SENILE DEMENTIA (ICD-290.0)  ANEMIA (ICD-285.9) & PERNICIOUS ANEMIA (ICD-281.0) - on monthly B12 shots for years... in 2006 she was also iron deficient and received IV iron therapy (she refused GI eval)... on FeSO4 daily... she feels well and denies weakness, fatigue, bleeding, dark stools, etc... ~  labs 3/08 showed Hg= 13.2 ~  labs 6/09 showed Hg= 12.7 ~  labs 6/10 showed Hg= 11.7... Fe, B12, Folate were all WNL. ~  labs 12/10 showed Hg= 12.4 ~  labs 12/11 showed Hg= 12.2, Fe=106 (34%sat)... ~  Labs 3/12 Hosp showed Hg= 12.2 ==> 10.4 ~  Labs 10/12 showed Hg= 11.3, MCV= 96  NOTE>> She has repeatedly refused GYN exams, PAP smears, Mammograms, & Colonoscopy...   Past Surgical History  Procedure Date  . Cholecystectomy   . Carotid endarterectomy     Outpatient Encounter Prescriptions as of 01/29/2011  Medication Sig Dispense Refill  . acetaminophen (TYLENOL) 500 MG tablet Take 500 mg by mouth every 6 (six) hours as needed.        Marland Kitchen aspirin 81 MG tablet Take 81 mg by mouth daily.        Marland Kitchen atorvastatin (LIPITOR) 10 MG tablet  TAKE 1 TABLET EVERY DAY  30 tablet  5  . cetirizine (ZYRTEC) 10 MG tablet Take 10 mg by mouth daily.        . Cholecalciferol (EQL VITAMIN D3) 1000 UNITS tablet Take 1,000 Units by mouth daily.        . [facility] RANITIDINE 75 MG tablet TAKE ONE TABLET BY MOUTH AT BEDTIME  30 tablet  9  .  cyanocobalamin (,VITAMIN B-12,) 1000 MCG/ML injection Inject 1,000 mcg into the muscle every 30 (thirty) days.        . fluticasone (VERAMYST) 27.5 MCG/SPRAY nasal spray Place 2 sprays into the nose daily.        . furosemide (LASIX) 40 MG tablet Take 40 mg by mouth daily.        . iron polysaccharides (NIFEREX) 150 MG capsule Take 150 mg by mouth daily.        . Multiple Vitamin (MULTIVITAMIN) capsule Take 1 capsule by mouth daily.        . ticlopidine (TICLID) 250 MG tablet TAKE ONE TABLET BY MOUTH TWICE DAILY  60 tablet  11    Allergies  Allergen Reactions  . Procaine Hcl     REACTION: pt states reaction to shot at dentist office w/ tachycardia \\T \ SOB (? if really from combination w/epi)    Review of Systems       See HPI - all other systems neg except as noted...      The patient complains of decreased hearing, dyspnea on exertion, and difficulty walking.  The patient denies anorexia, fever, weight loss, weight gain, vision loss, hoarseness, chest pain, syncope, peripheral edema, prolonged cough, headaches, hemoptysis, abdominal pain, melena, hematochezia, severe indigestion/heartburn, hematuria, incontinence, muscle weakness, suspicious skin lesions, transient blindness, depression, unusual weight change, abnormal bleeding, enlarged lymph nodes, and angioedema.     Objective:   Physical Exam     WD WN 77 y/o WF in NAD...  GENERAL:  Alert & oriented x 2;  pleasant & cooperative... HEENT:  [place]/AT, EOM-wnl, PERRLA, Fundi-benign, EACs-clear, TMs-wnl, NOSE-clear, THROAT-clear but dentition is very poor. NECK:  Supple w/ adeq ROM; no JVD; carotid impulses OK w/o bruits, +CAE scar on Rt; no thyromegaly or  nodules palpated; no lymphadenopathy. CHEST:  Clear to P & A; without wheezes/ rales/ or rhonchi. HEART:  Regular Rhythm; without murmurs/ rubs/ or gallops. ABDOMEN:  Soft & nontender; normal bowel sounds; no organomegaly or masses detected. EXT: without deformities or arthritic changes; she has VI changes w/ 2+edema on Lft & 1+edema on Rt NEURO:  CN's intact;  no focal neuro deficits... DERM:  No lesions noted; no rash- just the bruises & abrasion from fall.   Assessment & Plan:   Cerebrovasc Dis>  On the ASA Ticlid & she does not want to change this med; continue same...  VI. Edema>  We discussed the need to take the Lasix every day, avoid salt, elev legs, wear support hose...  CHOL>  On Lip10 & f/u FLP showed Chol numbers ok on the Lip, but elev TG=240 & needs much better low fat diet...  HH/ GERD>  She takes Zantac 150mg /d & doing quite well, no symptoms she says...  Renal Insuffic>  Creat is elev again at 1.9, on Lasix40; rec to elim salt, elev legs, decr Lasi40 to 1/2 tab daily & incr po fluids....  Hx Stroke & Dementia>  On ASA & Ticlid & she doesn't want to change Rx; continue same & they decline meds Rx (eg- Aricept etc)...  Anemia & B12 Defic>  On monthly B12 shots & Fe daily;  Continue same & f/u labs on return.Marland KitchenMarland Kitchen

## 2011-01-31 ENCOUNTER — Encounter: Payer: Self-pay | Admitting: Pulmonary Disease

## 2011-03-01 ENCOUNTER — Ambulatory Visit: Payer: Medicare Other

## 2011-03-01 ENCOUNTER — Ambulatory Visit (INDEPENDENT_AMBULATORY_CARE_PROVIDER_SITE_OTHER): Payer: Medicare Other

## 2011-03-01 DIAGNOSIS — D649 Anemia, unspecified: Secondary | ICD-10-CM

## 2011-03-01 MED ORDER — CYANOCOBALAMIN 1000 MCG/ML IJ SOLN
1000.0000 ug | Freq: Once | INTRAMUSCULAR | Status: AC
  Administered 2011-03-01: 1000 ug via INTRAMUSCULAR

## 2011-03-31 ENCOUNTER — Other Ambulatory Visit: Payer: Self-pay | Admitting: Pulmonary Disease

## 2011-04-01 ENCOUNTER — Ambulatory Visit (INDEPENDENT_AMBULATORY_CARE_PROVIDER_SITE_OTHER): Payer: Medicare Other

## 2011-04-01 DIAGNOSIS — D649 Anemia, unspecified: Secondary | ICD-10-CM

## 2011-04-01 MED ORDER — CYANOCOBALAMIN 1000 MCG/ML IJ SOLN
1000.0000 ug | Freq: Once | INTRAMUSCULAR | Status: AC
  Administered 2011-04-01: 1000 ug via INTRAMUSCULAR

## 2011-04-15 ENCOUNTER — Ambulatory Visit (INDEPENDENT_AMBULATORY_CARE_PROVIDER_SITE_OTHER): Payer: Medicare Other | Admitting: Pulmonary Disease

## 2011-04-15 ENCOUNTER — Encounter: Payer: Self-pay | Admitting: Pulmonary Disease

## 2011-04-15 ENCOUNTER — Other Ambulatory Visit (INDEPENDENT_AMBULATORY_CARE_PROVIDER_SITE_OTHER): Payer: Medicare Other

## 2011-04-15 DIAGNOSIS — D649 Anemia, unspecified: Secondary | ICD-10-CM

## 2011-04-15 DIAGNOSIS — F0390 Unspecified dementia without behavioral disturbance: Secondary | ICD-10-CM

## 2011-04-15 DIAGNOSIS — E559 Vitamin D deficiency, unspecified: Secondary | ICD-10-CM

## 2011-04-15 DIAGNOSIS — R609 Edema, unspecified: Secondary | ICD-10-CM

## 2011-04-15 DIAGNOSIS — K449 Diaphragmatic hernia without obstruction or gangrene: Secondary | ICD-10-CM

## 2011-04-15 DIAGNOSIS — I872 Venous insufficiency (chronic) (peripheral): Secondary | ICD-10-CM

## 2011-04-15 DIAGNOSIS — I6350 Cerebral infarction due to unspecified occlusion or stenosis of unspecified cerebral artery: Secondary | ICD-10-CM

## 2011-04-15 DIAGNOSIS — E7800 Pure hypercholesterolemia, unspecified: Secondary | ICD-10-CM

## 2011-04-15 DIAGNOSIS — I679 Cerebrovascular disease, unspecified: Secondary | ICD-10-CM

## 2011-04-15 DIAGNOSIS — N259 Disorder resulting from impaired renal tubular function, unspecified: Secondary | ICD-10-CM

## 2011-04-15 LAB — CBC WITH DIFFERENTIAL/PLATELET
Basophils Absolute: 0 10*3/uL (ref 0.0–0.1)
Hemoglobin: 11.3 g/dL — ABNORMAL LOW (ref 12.0–15.0)
Lymphocytes Relative: 23.6 % (ref 12.0–46.0)
Monocytes Relative: 7.4 % (ref 3.0–12.0)
Neutro Abs: 5.1 10*3/uL (ref 1.4–7.7)
RDW: 13.4 % (ref 11.5–14.6)

## 2011-04-15 LAB — BASIC METABOLIC PANEL
Calcium: 9.4 mg/dL (ref 8.4–10.5)
GFR: 32.5 mL/min — ABNORMAL LOW (ref >60.00)
Glucose, Bld: 105 mg/dL — ABNORMAL HIGH (ref 70–99)
Sodium: 143 mEq/L (ref 135–145)

## 2011-04-15 NOTE — Patient Instructions (Addendum)
Today we updated your med list in our EPIC system...    Continue your current medications the same for now...    If the result of our investigation warrants a change in medication we will let you know after all the tests are in...  We will arrange for a cerebrovascular evaluation w/ Carotid Dopplers and MRI scanning...  We will arrange for you to be checked by our stroke specialist: DrWong  Be sure to eliminate all the salt/ sodium from your diet & wear support hose; elevate your legs whenever possible...  Call for any questions.Marland KitchenMarland Kitchen

## 2011-04-15 NOTE — Progress Notes (Signed)
Subjective:    Patient ID: Carrie Grimes, female    DOB: [birth year], 77 y.o.   MRN: [id number]  HPI 77 y/o WF here for a follow up visit... she has mult med problems including cerebrovasc dis & stroke w/ prev right CAE in 1992;  chr ven insuffic w/ edema;  Hyperchol;  large HH;  mild renal insuffic &  hx UTIs;  DJD & Vit D defic;  senile dementia;  anemia...  ~  July 05, 2010:  She was Hosp at [facility] 3/1 - 06/30/10 w/ gastroent (n/v/d) & dehydration (creat 2.0 ==> 1.4 after hydration);  also had UTI w/ sens EColi (given Rocephin IV inhosp) and anemia (Hg=10-11 range)... here for f/u & improved> never had any urinary symptoms & we decided to treat outpt w/ Cipro to be sure it is resolved;  f/u labs today showed Creat=1.4 at baseline, Hg=11.5 improved... she has restarted her Lasix at 40mg  Qam >>  ~  October 23, 2010:  37mo ROV & Carrie [facility]imes has no complaints as usual, cheerful, laughing, feeling OK, etc;  Exam shows VI & 2+edema & we discussed taking Lasix40 every day, no salt, elevation, support hose, etc;  We reviewed prev XRays & labs from 3/12 [facility]...  ~  January 29, 2011:  74mo ROV & review of mult medical problems:    Cerebrovasc dis, Hx Stroke>  On ASA & Ticlid; she has been stable, in good spirits, w/o cerebral ischemic symptoms on these meds...    VI/ Edema>  On low sodium diet & Lasix40; wt is stable at 140# w/ VI changes and trace-1+ edema in LEs...    Chol>  On Lip10; FLP today showed TChol 151, TG 240, HDL 44, LDL 75; continue same med & better low fat diet!    HH>  On Antireflux regimen & Ranitadine; she has large HH seen on prev CT scan; denies CP, Abd pain, N/ V, dysphagia etc...    Renal Insuffic>  Baseline Creat= 1.4-1.5; labs today showed BUN=39, Creat=1.9; rec to decr Lasix40 to 1/2 tab daily & incr po fluids.    Vit D Defic>  On MVI + VitD 2000u daily; encouraged to take Carrie vit D every day...    Senile dementia>  She is pleasantly demented & cared for beautifully by her family...  Hx Anemia>  On MVI + Niferex; labs showed Hg= 11.3, MCV=96; rec to continue her Fe, VitC & supplements...    Vit B12 Defic>  On B12 shots monthly...  ~  April 15, 2011:  44mo ROV & add-on for concern over "mini stroke"> family noted some change in behavior over Carrie last 2wks- pt walking around furniture in an odd way, getting her meds confused (family should have been doing Carrie meds for her all along), and saw her optometrist recently w/ cataracts found but no need for surg yet (?he saw hemorrhage in back of eye and questioned mini-strokes) so they requested this appt;  Recall hx right brain stroke 1992 w/ extensive area of encephalomalacia, & carotid art dis w/ right CAE 1992 by Carrie Grimes & taking ASA/ Ticlid ever since (Note: they have refused to consider ch in meds, f/u Brain scans, or f/u CDopplers)...  They have now agreed to further testing & we will f/u LABS, MRI Brain, CDopplers ==> pending...    >MRI Brain 04/19/11 showed large remote right hemispheric infarct w/ encephalomalacia, prominent sm vessel dis, global atrophy, no hemorrhage or mass, sinus mucosal thickening, CSpine spondylosis...    Note: at  her last visit> baseline Creat noted to be 1.4-1.5; labs 10/12 showed BUN=39, Creat=1.9 so we decr Lasix40 to 1/2 tab daily & incr po fluids;  f/u blood work on Carrie Lasix 20mg /d dose now shows BUN=37, Creat=1.6;  BP remains good at 128/72 today...    Hx Anemia w/ Fe defic & B12 defic on oral Iron & B12 shots monthly xyrs;  Labs today shows Hg=11.3 (stable) w/ MCV=96, Fe=56 (23%sat);  Rec to continue same Fe/ VitC, B12 shots...          Problem List:    ALLERGIC RHINITIS (ICD-477.9) - she uses Claritin & Flonase as needed... she has 2 cats- [facility] & [facility]!  HEARING LOSS (ICD-389.9) - she doesn't have hearing aides- "it wouldn't work"...  DENTAL CARIES (ICD-521.00) - she has been repeatedly reminded to seek dental help, but refuses... family purees her foods...  CEREBROVASCULAR DISEASE  (ICD-437.9) - s/p right CAE by [facility] 1992> stable on ASA 81mg /d and TICLID 250mg Bid ever since then... she has repeatedly refused f/u CDopplers, etc... she has been doing well and remains asymptomatic- denies focal weakness, numbness, paresthesia, pain, slurred speech, headache, dizziness, tremor, or seizure. ~  12/12: presented w/ some behavior change & family requested further eval; f/u CDopplers ==> pending  VENOUS INSUFFICIENCY, CHRONIC (ICD-459.81) - she has chronic LE edema- on LASIX 40mg /d now and low sodium diet... she had gained 23# to 152# in 2009 w/ Ensure & puree diet from famiy, now down to 140#... advises on low sodium, elevate legs, support hose... ~  6/11:  BUN=40, Creat=1.6 on Carrie Lasix80 & asked to decr to 40mg /d and restrict sodium. ~  12/11:  labs showed BUN=41, Creat=1.5 on Lasix40... Baseline Creat ~1.4-1.5 ~  10/12:  Labs showed BUN=39, Creat=1.9.Marland KitchenMarland Kitchen rec to decr lasix further to 1/2 tab daily...  HYPERCHOLESTEROLEMIA (ICD-272.0) - on diet + LIPITOR 10mg /d... her TG's have been elevated but she refuses more meds. ~  labs 3/08 showed TChol 183, TG 205, HDL 54, LDL 98 ~  labs 6/09 showed TChol 190, TG 314, HDL 43, LDL 94... she refuses more meds, rec low fat diet. ~  labs 6/10 showed TChol 184, TG 279, HDL 50, LDL 97 ~  labs 6/11 showed TChol 180, TG 277, HDL 49, LDL 95 ~  FLP 12/11 showed TChol 168. TH 184, HDL50, LDL 81  HIATAL HERNIA (ICD-553.3) - large HH seen on prev CT Abd in 2001... takes ZANTAC 75mg  Qhs...  RENAL INSUFFICIENCY (ICD-588.9) - Creat= 1.5 to 1.7 in 2008... ~  labs 6/09 showed BUN= 33, Creat= 1.4 ~  labs 6/10 showed BUN= 36, Creat= 1.5 ~  labs 12/10 showed BUN= 33, Creat= 1.4 ~  labs 6/11 showed BUN=40, Creat=1.6.Marland KitchenMarland Kitchen rec to decr Lasix from 80 to 40mg /d. ~  labs 12/11 showed BUN=41, creat=1.5 ~  Labs 3/12 Hosp showed Creat 2.01 ==> 1.38 ~  Labs 10/12 showed BUN=39, Creat=1.9 & we rec that she decr Lasix to 20mg /d. ~  Labs 12/12 on Lasix20 showed  BUN=37, Creat=1.6.Marland KitchenMarland Kitchen BP is wnl, continue same meds.  UTI'S, HX OF (ICD-V13.00)  VITAMIN D DEFICIENCY (ICD-268.9) - she is likely osteoporotic but unmeasured due to refusal of BMD & Bisphos therapy options... ~  labs 6/09 showed Vit D level = 26... rec to take OTC Vit D 1000 u daily... ~  labs 6/10 showed Vit D level = 24... rec to incr Vit D to 2000 u daily.  Hx of STROKE (ICD-434.91) - hx right brain stroke in 1992 w/ CTBrain showing  right post temporal & parietal low density area w/ encephalomalacia... ~  She remains on ASA 81mg /d & TICLID 250mg Bid w/o acute cerebral ischemic symptoms... ~  12/12: she presented w/ some behavioral changes but no focal neuro deficits; they requested further eval after 39yrs of refusing f/u scans, dopplers, etc... ~  MRI Brain 12/12 showed large remote right hemispheric infarct w/ encephalomalacia, prominent sm vessel dis, global atrophy, no hemorrhage or mass, sinus mucosal thickening, CSpine spondylosis...  SENILE DEMENTIA (ICD-290.0)  ANEMIA (ICD-285.9) & PERNICIOUS ANEMIA (ICD-281.0) - on monthly B12 shots for years... in 2006 she was also iron deficient and received IV iron therapy (she refused GI eval)... on FeSO4 daily... she feels well and denies weakness, fatigue, bleeding, dark stools, etc... ~  labs 3/08 showed Hg= 13.2 ~  labs 6/09 showed Hg= 12.7 ~  labs 6/10 showed Hg= 11.7... Fe, B12, Folate were all WNL. ~  labs 12/10 showed Hg= 12.4 ~  labs 12/11 showed Hg= 12.2, Fe=106 (34%sat)... ~  Labs 3/12 Hosp showed Hg= 12.2 ==> 10.4 ~  Labs 10/12 showed Hg= 11.3, MCV= 96 ~  Labs 12/12 showed Hg=11.3 w/ MCV=96, Fe=56 (23%sat); Rec to continue same Fe/ VitC, B12 shots...  NOTE>> She has repeatedly refused GYN exams, PAP smears, Mammograms, & Colonoscopy...   Past Surgical History  Procedure Date  . Cholecystectomy   . Carotid endarterectomy     Outpatient Encounter Prescriptions as of 04/15/2011  Medication Sig Dispense Refill  .  acetaminophen (TYLENOL) 500 MG tablet Take 500 mg by mouth every 6 (six) hours as needed.        Marland Kitchen aspirin 81 MG tablet Take 81 mg by mouth daily.        Marland Kitchen atorvastatin (LIPITOR) 10 MG tablet TAKE 1 TABLET EVERY DAY  30 tablet  5  . cetirizine (ZYRTEC) 10 MG tablet Take 10 mg by mouth daily.        . Cholecalciferol (EQL VITAMIN D3) 1000 UNITS tablet Take 1,000 Units by mouth daily.        . [facility] RANITIDINE 75 MG tablet TAKE ONE TABLET BY MOUTH AT BEDTIME  30 tablet  9  . cyanocobalamin (,VITAMIN B-12,) 1000 MCG/ML injection Inject 1,000 mcg into Carrie muscle every 30 (thirty) days.        . fluticasone (VERAMYST) 27.5 MCG/SPRAY nasal spray Place 2 sprays into Carrie nose daily.        . furosemide (LASIX) 40 MG tablet Take 40 mg by mouth daily.        . iron polysaccharides (NIFEREX) 150 MG capsule Take 150 mg by mouth daily.        . Multiple Vitamin (MULTIVITAMIN) capsule Take 1 capsule by mouth daily.        . ticlopidine (TICLID) 250 MG tablet TAKE ONE TABLET BY MOUTH TWICE DAILY  60 tablet  11    Allergies  Allergen Reactions  . Procaine Hcl     REACTION: pt states reaction to shot at dentist office w/ tachycardia \\T \ SOB (? if really from combination w/epi)    Current Medications, Allergies, Past Medical History, Past Surgical History, Family History, and Social History were reviewed in [employer] record.      Review of Systems       See HPI - all other systems neg except as noted...      Carrie patient complains of decreased hearing, dyspnea on exertion, and difficulty walking.  Carrie patient denies anorexia, fever, weight loss, weight  gain, vision loss, hoarseness, chest pain, syncope, peripheral edema, prolonged cough, headaches, hemoptysis, abdominal pain, melena, hematochezia, severe indigestion/heartburn, hematuria, incontinence, muscle weakness, suspicious skin lesions, transient blindness, depression, unusual weight change, abnormal bleeding, enlarged lymph  nodes, and angioedema.     Objective:   Physical Exam     WD WN 77 y/o WF in NAD...  GENERAL:  Alert & oriented x 2;  pleasant & cooperative... HEENT:  [place]/AT, EOM-wnl, PERRLA, Fundi-benign, EACs-clear, TMs-wnl, NOSE-clear, THROAT-clear but dentition is very poor. NECK:  Supple w/ adeq ROM; no JVD; carotid impulses OK w/o bruits, +CAE scar on Rt; no thyromegaly or nodules palpated; no lymphadenopathy. CHEST:  Clear to P & A; without wheezes/ rales/ or rhonchi. HEART:  Regular Rhythm; without murmurs/ rubs/ or gallops. ABDOMEN:  Soft & nontender; normal bowel sounds; no organomegaly or masses detected. EXT: without deformities or arthritic changes; she has VI changes w/ 2+edema on Lft & 1+edema on Rt NEURO:  CN's intact;  no focal neuro deficits... DERM:  No lesions noted; no rash- just Carrie bruises & abrasion from fall.  RADIOLOGY DATA:  Reviewed in Carrie EPIC EMR & discussed w/ Carrie patient...  LABORATORY DATA:  Reviewed in Carrie EPIC EMR & discussed w/ Carrie patient...   Assessment & Plan:   Cerebrovasc Dis>  On Carrie ASA/ Ticlid & she does not want to change this med, therefore continue same; I gave them Carrie option of switching to ASA/ Plavix.  VI. Edema>  We is reminded to take Carrie Lasix20 every day, avoid salt, elev legs, wear support hose...  CHOL>  On Lip10 & f/u FLP showed Chol numbers ok, but elev TG=240 & needs much better low fat diet...  HH/ GERD>  She takes Zantac 150mg /d & doing quite well, no symptoms she says...  Renal Insuffic>  Creat is elev at 1.6 on Lasix20 but this is improved from prev 1.9 on Lasix40; continue same, low sodium, incr fluid intake...  Hx Stroke & Dementia>  MRI 12/12 shows extensive chronic changes but NAD; they want to continue ASA/ Ticlid; behavioral changes noted are likely a manifestation of her senile dementia> offered Aricept, Ativan, etc but they are ambivalent & will decide & let me know.  Anemia & B12 Defic>  On monthly B12 shots & Fe daily;   Continue same.Marland KitchenMarland Kitchen

## 2011-04-19 ENCOUNTER — Other Ambulatory Visit: Payer: Self-pay | Admitting: Pulmonary Disease

## 2011-04-19 ENCOUNTER — Ambulatory Visit (HOSPITAL_COMMUNITY)
Admission: RE | Admit: 2011-04-19 | Discharge: 2011-04-19 | Disposition: A | Discharge status: Home / Self Care | Payer: Medicare Other | Source: Ambulatory Visit | Attending: Pulmonary Disease | Admitting: Pulmonary Disease

## 2011-04-19 ENCOUNTER — Encounter: Payer: Self-pay | Admitting: Pulmonary Disease

## 2011-04-19 DIAGNOSIS — G9389 Other specified disorders of brain: Secondary | ICD-10-CM | POA: Insufficient documentation

## 2011-04-19 DIAGNOSIS — F29 Unspecified psychosis not due to a substance or known physiological condition: Secondary | ICD-10-CM | POA: Insufficient documentation

## 2011-04-19 DIAGNOSIS — I6350 Cerebral infarction due to unspecified occlusion or stenosis of unspecified cerebral artery: Secondary | ICD-10-CM

## 2011-04-19 DIAGNOSIS — I7389 Other specified peripheral vascular diseases: Secondary | ICD-10-CM | POA: Insufficient documentation

## 2011-04-19 DIAGNOSIS — R279 Unspecified lack of coordination: Secondary | ICD-10-CM | POA: Insufficient documentation

## 2011-04-19 DIAGNOSIS — G319 Degenerative disease of nervous system, unspecified: Secondary | ICD-10-CM | POA: Insufficient documentation

## 2011-04-19 DIAGNOSIS — Z8673 Personal history of transient ischemic attack (TIA), and cerebral infarction without residual deficits: Secondary | ICD-10-CM | POA: Insufficient documentation

## 2011-04-25 ENCOUNTER — Telehealth: Payer: Self-pay | Admitting: Pulmonary Disease

## 2011-04-25 NOTE — Telephone Encounter (Signed)
Called and spoke with family about lab results.  See result note.

## 2011-04-29 ENCOUNTER — Other Ambulatory Visit: Payer: Self-pay | Admitting: Pulmonary Disease

## 2011-04-29 DIAGNOSIS — I6529 Occlusion and stenosis of unspecified carotid artery: Secondary | ICD-10-CM

## 2011-05-01 ENCOUNTER — Encounter (INDEPENDENT_AMBULATORY_CARE_PROVIDER_SITE_OTHER): Payer: Medicare Other | Admitting: *Deleted

## 2011-05-01 ENCOUNTER — Ambulatory Visit (INDEPENDENT_AMBULATORY_CARE_PROVIDER_SITE_OTHER): Payer: Medicare Other

## 2011-05-01 DIAGNOSIS — I6529 Occlusion and stenosis of unspecified carotid artery: Secondary | ICD-10-CM

## 2011-05-01 DIAGNOSIS — D649 Anemia, unspecified: Secondary | ICD-10-CM

## 2011-05-03 DIAGNOSIS — D649 Anemia, unspecified: Secondary | ICD-10-CM

## 2011-05-03 MED ORDER — CYANOCOBALAMIN 1000 MCG/ML IJ SOLN
1000.0000 ug | Freq: Once | INTRAMUSCULAR | Status: AC
  Administered 2011-05-03: 1000 ug via INTRAMUSCULAR

## 2011-05-06 ENCOUNTER — Encounter: Payer: Self-pay | Admitting: Neurology

## 2011-05-06 ENCOUNTER — Ambulatory Visit (INDEPENDENT_AMBULATORY_CARE_PROVIDER_SITE_OTHER): Payer: Medicare Other | Admitting: Neurology

## 2011-05-06 VITALS — BP 134/76 | HR 68 | Ht 64.0 in | Wt 138.0 lb

## 2011-05-06 DIAGNOSIS — I6350 Cerebral infarction due to unspecified occlusion or stenosis of unspecified cerebral artery: Secondary | ICD-10-CM

## 2011-05-06 NOTE — Progress Notes (Signed)
Dear Dr. Lenna Gilford,  Thank you for having me see Carrie [facility] in consultation today at [facility] for her problem with difficulty walking, difficulty recognizing her medications and agitation.  As you may recall, she is a 78 y.o. year old female with a history of right MCA infarction, s/p right CEA, who is accompanied by her daughter today.  The patient has not noted any problems, but her daughter notes that about 1 month ago she has had worsening walking, in that she seems to have to use furniture to guide her through the room.  She also has had problems recognizing her pills as well as problems with increase "agitation".  She saw her eye physician, Dr. Hassell Done, who found a retinal hemorrhage(unfortunately I don't know which eye).  He brought up the problem of a possible "mini-stroke".  You preceded with getting an repeat MRI brain, which revealed old encephalomalacia in the right MCA territory as well as significant bilateral subcortical white matter disease.  A carotid doppler is still pending.  The patient is unfortunately not able to give any information about her visual issues.  She has not perceived a change in her vision.  They have not noticed a change in her abilities with respect to her use of her left or right arm.  Notably her stroke left her with left sided weakness as well as a dense left visual field cut.  It sounds like she saw a neuro-ophthalmologist at [facility] and was fitted with prisms, although did not have a history of diplopia as far as I can tell.  I do not have any record of the side her CEA was done on.  Past Medical History  Diagnosis Date  . Allergic rhinitis, cause unspecified   . Unspecified hearing loss   . Cerebrovascular disease, unspecified   . Unspecified venous (peripheral) insufficiency   . Pure hypercholesterolemia   . Unspecified disorder resulting from impaired renal function   . Diaphragmatic hernia without mention of obstruction or gangrene     . Unspecified vitamin D deficiency   . Unspecified cerebral artery occlusion with cerebral infarction   . Senile dementia, uncomplicated   . Anemia, unspecified   . Pernicious anemia     Past Surgical History  Procedure Date  . Cholecystectomy   . Carotid endarterectomy     History   Social History  . Marital Status: Widowed    Spouse Name: N/A    Number of Children: N/A  . Years of Education: N/A   Social History Main Topics  . Smoking status: Never Smoker   . Smokeless tobacco: Never Used  . Alcohol Use: No  . Drug Use: No  . Sexually Active: None   Other Topics Concern  . None   Social History Narrative  . None    Family History  Problem Relation Age of Onset  . Heart disease Mother   . Heart disease Father     Current Outpatient Prescriptions on File Prior to Visit  Medication Sig Dispense Refill  . acetaminophen (TYLENOL) 500 MG tablet Take 500 mg by mouth every 6 (six) hours as needed.        Marland Kitchen aspirin 81 MG tablet Take 81 mg by mouth daily.        Marland Kitchen atorvastatin (LIPITOR) 10 MG tablet TAKE 1 TABLET EVERY DAY  30 tablet  5  . cetirizine (ZYRTEC) 10 MG tablet Take 10 mg by mouth daily.        . Cholecalciferol (EQL  VITAMIN D3) 1000 UNITS tablet Take 1,000 Units by mouth daily.        . [facility]S RANITIDINE 75 MG tablet TAKE ONE TABLET BY MOUTH AT BEDTIME  30 tablet  9  . cyanocobalamin (,VITAMIN B-12,) 1000 MCG/ML injection Inject 1,000 mcg into the muscle every 30 (thirty) days.        . fluticasone (VERAMYST) 27.5 MCG/SPRAY nasal spray Place 2 sprays into the nose daily.        . furosemide (LASIX) 20 MG tablet Take 20 mg by mouth daily.        . iron polysaccharides (NIFEREX) 150 MG capsule Take 150 mg by mouth daily.        . Multiple Vitamin (MULTIVITAMIN) capsule Take 1 capsule by mouth daily.        . ticlopidine (TICLID) 250 MG tablet TAKE ONE TABLET BY MOUTH TWICE DAILY  60 tablet  11  . vitamin C (ASCORBIC ACID) 500 MG tablet Take 500 mg by mouth  daily. To take along with the iron tablet daily         Allergies  Allergen Reactions  . Procaine Hcl     REACTION: pt states reaction to shot at dentist office w/ tachycardia \\T \ SOB (? if really from combination w/epi)      ROS:  13 systems were reviewed and are notable for some difficulty with memory.  She also has chronic swelling of her left foot.  All other review of systems are unremarkable.   Examination:  Filed Vitals:   05/06/11 1048  BP: 134/76  Pulse: 68  Height: 5\' 4"  (1.626 m)  Weight: 138 lb (62.596 kg)     In general, well appearing women,  who is very hard of hearing  Cardiovascular: The patient has a regular rate and rhythm.  Fundoscopy:  Disks are flat. View limited by [facility] opacity(cataracts?) .  I did not appreciate hemorrhage in this undilated exam.   Mental status:   Oriented to day, month, but not date or year.  Oriented to floor, city, building.  2/3 recall.  5/5 WORLD backwards.  Cranial Nerves: Pupils are equally round and reactive to light. Visual fields reveal dense left hemianopsia.  Extraocular movements are intact without nystagmus. Facial sensation and muscles of mastication are intact. Muscles of facial expression reveal mild left facial droop. Severely decreased bilaterally to finger rub. Tongue protrusion, uvula, palate midline.  Shoulder shrug intact bilaterally.  Motor:  The patient has normal bulk, no pronator drift. There is a mild right sided postural tremor.  ?mild left sided weakness to confrontation with some mild spasticity.    Reflexes:   Biceps  Triceps Brachioradialis Knee Ankle  Right 2+  2+  2+   2+ 2+  Left  2+  2+  2+   3+ 2+  Left toe up, right down.  Coordination:  Normal finger to nose.    Sensation is intact to touch bilaterally(although limited by patient being able to follow commands  Gait and Station reveal mild left spastic hemiparesis.    Romberg is negative - albeit with feet still about 3 inches  apart.  MRI brain was reviewed and revealed severe WMD bilaterally, with old large MCA infarct.   Impression/Recs: 78 year old who seems to have had worsening problems walking(needing to grab for objects) as well as seeing her pills, that seems consistent with an abrupt deterioration in her vision.  I am not convinced there is concrete evidence of a new stroke  here.  I think it is more likely that given her known visual field cut that her retinal hemorrhage has caused a further deterioration in her vision. I will follow up her carotid U/S.  I don't think there is a role for further investigations at this point in time and given the absence of concrete evidence of another stroke, I would not change her anti-platelet regimen.  If she continues to have deterioration in her neurologic status I am happy to see her back, but for now I think watchful waiting is the best course of action.    Thank you for having [place] see Carrie Grimes in consultation.  Feel free to contact me with any questions.  Kavin Leech Jacelyn Grip, MD [facility], [place] [address], [place] [address] Phone: [phone] Fax: [phone].

## 2011-05-06 NOTE — Patient Instructions (Signed)
Follow up as needed

## 2011-05-08 ENCOUNTER — Telehealth: Payer: Self-pay | Admitting: Pulmonary Disease

## 2011-05-08 NOTE — Telephone Encounter (Signed)
CDopplers are stable w/o any progressive stenoses... Continue same meds & follow up plans... I spoke with daughter and made her aware of results. She voiced her understanding and had no questions

## 2011-06-03 ENCOUNTER — Ambulatory Visit: Payer: Medicare Other

## 2011-06-03 ENCOUNTER — Ambulatory Visit: Payer: Medicare Other | Admitting: Pulmonary Disease

## 2011-06-04 ENCOUNTER — Ambulatory Visit (INDEPENDENT_AMBULATORY_CARE_PROVIDER_SITE_OTHER): Payer: Medicare Other

## 2011-06-04 DIAGNOSIS — D649 Anemia, unspecified: Secondary | ICD-10-CM

## 2011-06-04 MED ORDER — CYANOCOBALAMIN 1000 MCG/ML IJ SOLN
1000.0000 ug | Freq: Once | INTRAMUSCULAR | Status: AC
  Administered 2011-06-04: 1000 ug via INTRAMUSCULAR

## 2011-06-26 ENCOUNTER — Encounter: Payer: Self-pay | Admitting: Pulmonary Disease

## 2011-06-26 ENCOUNTER — Ambulatory Visit (INDEPENDENT_AMBULATORY_CARE_PROVIDER_SITE_OTHER): Payer: Medicare Other | Admitting: Pulmonary Disease

## 2011-06-26 VITALS — BP 120/74 | HR 74 | Temp 98.4°F | Ht 64.0 in | Wt 138.4 lb

## 2011-06-26 DIAGNOSIS — F0391 Unspecified dementia with behavioral disturbance: Secondary | ICD-10-CM | POA: Insufficient documentation

## 2011-06-26 DIAGNOSIS — F03918 Unspecified dementia, unspecified severity, with other behavioral disturbance: Secondary | ICD-10-CM

## 2011-06-26 DIAGNOSIS — H9190 Unspecified hearing loss, unspecified ear: Secondary | ICD-10-CM

## 2011-06-26 DIAGNOSIS — N259 Disorder resulting from impaired renal tubular function, unspecified: Secondary | ICD-10-CM

## 2011-06-26 DIAGNOSIS — M1990 Unspecified osteoarthritis, unspecified site: Secondary | ICD-10-CM

## 2011-06-26 DIAGNOSIS — I872 Venous insufficiency (chronic) (peripheral): Secondary | ICD-10-CM

## 2011-06-26 DIAGNOSIS — I6350 Cerebral infarction due to unspecified occlusion or stenosis of unspecified cerebral artery: Secondary | ICD-10-CM

## 2011-06-26 DIAGNOSIS — D510 Vitamin B12 deficiency anemia due to intrinsic factor deficiency: Secondary | ICD-10-CM

## 2011-06-26 DIAGNOSIS — I679 Cerebrovascular disease, unspecified: Secondary | ICD-10-CM

## 2011-06-26 MED ORDER — DIVALPROEX SODIUM 250 MG PO DR TAB: DELAYED_RELEASE_TABLET | ORAL | Status: DC

## 2011-06-26 MED ORDER — CYANOCOBALAMIN 1000 MCG/ML IJ SOLN
1000.0000 ug | Freq: Once | INTRAMUSCULAR | Status: AC
  Administered 2011-06-26: 1000 ug via INTRAMUSCULAR

## 2011-06-26 MED ORDER — FUROSEMIDE 20 MG PO TABS: 20.0000 mg | ORAL_TABLET | Freq: Every day | ORAL | Status: DC

## 2011-06-26 NOTE — Patient Instructions (Signed)
Today we updated your med list in our EPIC system...    Continue your current medications the same...  We decided to try a new medication to see if it calms her down>    DEPAKOTE 250mg  tabs start w/ 1/2 tab in the evening for 2 weeks & increase to 1/2 tab twice daily if necessary...  Let's plan a recheck in 6-8 weeks to monitor her progress, call for any questions.Marland KitchenMarland Kitchen

## 2011-06-27 ENCOUNTER — Telehealth: Payer: Self-pay | Admitting: Pulmonary Disease

## 2011-06-27 NOTE — Telephone Encounter (Signed)
DEPAKOTE 125mg  tabs start w/ 1 tab in the evening for 2 weeks & increase to 1 tab twice daily if necessary... Per SN; I have changed the RX at the pharmacy. Updated EPIC medication list as well.    Hassan Rowan (pts daughter) is aware of change in RX and sig.

## 2011-07-01 ENCOUNTER — Telehealth: Payer: Self-pay | Admitting: Pulmonary Disease

## 2011-07-01 NOTE — Telephone Encounter (Signed)
Spoke with patients daughter-patient took RX on Thursday night and Friday night-made her shaky and dizzy and would not take anymore. SN, please advise. Pt is taking Depakote 125mg  take 1 qhs for 2 weeks then increase to 1 bid as needed. Thanks.

## 2011-07-02 NOTE — Telephone Encounter (Signed)
Called and spoke with brenda and she is aware that she can open to capsule of the depakote and sprinkle the meds in with her food.  She will try this for now and see if this works and will call back with any problems

## 2011-07-02 NOTE — Telephone Encounter (Signed)
Pt daughter return call brenda joyce [phone]

## 2011-07-02 NOTE — Telephone Encounter (Signed)
LMTCB x 1 

## 2011-07-02 NOTE — Telephone Encounter (Signed)
Per SN-if they can't get her to take capsule by mouth then try mixing it in food-we can try the Depakote "sprinke" 125mg  open capsule and sprinkle in apple sauce.

## 2011-07-28 ENCOUNTER — Encounter: Payer: Self-pay | Admitting: Pulmonary Disease

## 2011-07-28 NOTE — Progress Notes (Addendum)
Subjective:    Patient ID: Carrie Grimes, female    DOB: 1920-11-23, 78 y.o.   MRN: [id number]  HPI 78 y/o WF here for a follow up visit... she has mult med problems including cerebrovasc dis & stroke w/ prev right CAE in 1992;  chr ven insuffic w/ edema;  Hyperchol;  large HH;  mild renal insuffic &  hx UTIs;  DJD & Vit D defic;  senile dementia;  anemia...  ~  July 05, 2010:  She was Hosp at [facility] 3/1 - 06/30/10 w/ gastroent (n/v/d) & dehydration (creat 2.0 ==> 1.4 after hydration);  also had UTI w/ sens EColi (given Rocephin IV inhosp) and anemia (Hg=10-11 range)... here for f/u & improved> never had any urinary symptoms & we decided to treat outpt w/ Cipro to be sure it is resolved;  f/u labs today showed Creat=1.4 at baseline, Hg=11.5 improved... she has restarted her Lasix at 40mg  Qam >>  ~  October 23, 2010:  76mo ROV & [name] has no complaints as usual, cheerful, laughing, feeling OK, etc;  Exam shows VI & 2+edema & we discussed taking Lasix40 every day, no salt, elevation, support hose, etc;  We reviewed prev XRays & labs from 3/12 [place]...  ~  January 29, 2011:  78mo ROV & review of mult medical problems:    Cerebrovasc dis, Hx Stroke>  On ASA & Ticlid; she has been stable, in good spirits, w/o cerebral ischemic symptoms on these meds...    VI/ Edema>  On low sodium diet & Lasix40; wt is stable at 140# w/ VI changes and trace-1+ edema in LEs...    Chol>  On Lip10; FLP today showed TChol 151, TG 240, HDL 44, LDL 75; continue same med & better low fat diet!    HH>  On Antireflux regimen & Ranitadine; she has large HH seen on prev CT scan; denies CP, Abd pain, N/ V, dysphagia etc...    Renal Insuffic>  Baseline Creat= 1.4-1.5; labs today showed BUN=39, Creat=1.9; rec to decr Lasix40 to 1/2 tab daily & incr po fluids.    Vit D Defic>  On MVI + VitD 2000u daily; encouraged to take the vit D every day...    Senile dementia>  She is pleasantly demented & cared for beautifully by her family...  Hx Anemia>  On MVI + Niferex; labs showed Hg= 11.3, MCV=96; rec to continue her Fe, VitC & supplements...    Vit B12 Defic>  On B12 shots monthly...  ~  April 15, 2011:  78mo ROV & add-on for concern over "mini stroke"> family noted some change in behavior over the last 2wks- pt walking around furniture in an odd way, getting her meds confused (family should have been doing the meds for her all along), and saw her optometrist recently w/ cataracts found but no need for surg yet (?he saw hemorrhage in back of eye and questioned mini-strokes) so they requested this appt;  Recall hx right brain stroke 1992 w/ extensive area of encephalomalacia, & carotid art dis w/ right CAE 1992 by [facility] & taking ASA/ Ticlid ever since (Note: they have refused to consider ch in meds, f/u Brain scans, or f/u CDopplers)...  They have now agreed to further testing & we will f/u LABS, MRI Brain, CDopplers ==> pending...    >MRI Brain 04/19/11 showed large remote right hemispheric infarct w/ encephalomalacia, prominent sm vessel dis, global atrophy, no hemorrhage or mass, sinus mucosal thickening, CSpine spondylosis...    Note: at  her last visit> baseline Creat noted to be 1.4-1.5; labs 10/12 showed BUN=39, Creat=1.9 so we decr Lasix40 to 1/2 tab daily & incr po fluids;  f/u blood work on the Lasix 20mg /d dose now shows BUN=37, Creat=1.6;  BP remains good at 128/72 today...    Hx Anemia w/ Fe defic & B12 defic on oral Iron & B12 shots monthly xyrs;  Labs today shows Hg=11.3 (stable) w/ MCV=96, Fe=56 (23%sat);  Rec to continue same Fe/ VitC, B12 shots...  ~  June 26, 2011:  78mo ROV & add-on at daugh request for behavioral changes assoc w/ her senile dementia> [name] saw DrWong for Neuro 1/13 & he felt that her abrupt changes noted by family were related to her sudden visual change (?hemorrhage noted by Optometrist & at [facility]) and not evid for additional stroke;  CDopplers 1/13 showed mild mixed plaque w/ 40-59% (low end)  bilat ICA stenoses, the prev right CAE & patch angioplasty were patent;  Since her last visit the family has noted some personality changes and agitation which is in stark contrast to her usual very sweet & cooperative demeanor;  She is apparently sl paranoid & daugh wants her checked for a chemical imbalance;  We reviewed avail options and decided to give her a trial of Depakote 125mg Bid> the family understands that they must take control of distrib all meds to the pt;  We reviewed all prev labs/ XRays/ Scans/ etc in EPIC... We will recheck her in 6-8weeks.          Problem List:    ALLERGIC RHINITIS (ICD-477.9) - she uses Claritin & Flonase as needed... she has 2 cats- Heidi & Aline Brochure!  HEARING LOSS (ICD-389.9) - she doesn't have hearing aides- "it wouldn't work"...  DENTAL CARIES (ICD-521.00) - she has been repeatedly reminded to seek dental help, but refuses... family purees her foods...  CEREBROVASCULAR DISEASE (ICD-437.9) - s/p right CAE by DrLawson 1992> stable on ASA 81mg /d and TICLID 250mg Bid ever since then... she had repeatedly refused f/u CDopplers etc for years... she had been doing well and was asymptomatic- denying focal weakness, numbness, paresthesia, pain, slurred speech, headache, dizziness, tremor, or seizure. ~  12/12: presented w/ some behavior change & family requested further eval:  MRI 12/12 showed large remote right hemispheric infarct w/ encephalomalacia, prominent sm vessel dis, global atrophy, no hemorrhage or mass, sinus mucosal thickening, CSpine spondylosis...   CDopplers 1/13 showed mild mixed plaque w/ 40-59% (low end) bilat ICA stenoses, the prev right CAE & patch angioplasty were patent... ~  1/13:  She saw DrWong for Neuro & his note is reviewed> he felt some of her recent motor problems were related to her visual changes & there was no evid for new stroke...  VENOUS INSUFFICIENCY, CHRONIC (ICD-459.81) - she has chronic LE edema- on LASIX 40mg /d now and low sodium  diet... she had gained 23# to 152# in 2009 w/ Ensure & puree diet from famiy, now down to 140#... advises on low sodium, elevate legs, support hose... ~  6/11:  BUN=40, Creat=1.6 on the Lasix80 & asked to decr to 40mg /d and restrict sodium. ~  12/11:  labs showed BUN=41, Creat=1.5 on Lasix40... Baseline Creat ~1.4-1.5 ~  10/12:  Labs showed BUN=39, Creat=1.9.Marland KitchenMarland Kitchen rec to decr lasix further to 1/2 tab daily...  HYPERCHOLESTEROLEMIA (ICD-272.0) - on diet + LIPITOR 10mg /d... her TG's have been elevated but she refuses more meds. ~  labs 3/08 showed TChol 183, TG 205, HDL 54, LDL 98 ~  labs  6/09 showed TChol 190, TG 314, HDL 43, LDL 94... she refuses more meds, rec low fat diet. ~  labs 6/10 showed TChol 184, TG 279, HDL 50, LDL 97 ~  labs 6/11 showed TChol 180, TG 277, HDL 49, LDL 95 ~  FLP 12/11 showed TChol 168. TH 184, HDL50, LDL 81  HIATAL HERNIA (ICD-553.3) - large HH seen on prev CT Abd in 2001... takes ZANTAC 75mg  Qhs...  RENAL INSUFFICIENCY (ICD-588.9) - Creat= 1.5 to 1.7 in 2008... ~  labs 6/09 showed BUN= 33, Creat= 1.4 ~  labs 6/10 showed BUN= 36, Creat= 1.5 ~  labs 12/10 showed BUN= 33, Creat= 1.4 ~  labs 6/11 showed BUN=40, Creat=1.6.Marland KitchenMarland Kitchen rec to decr Lasix from 80 to 40mg /d. ~  labs 12/11 showed BUN=41, creat=1.5 ~  Labs 3/12 Hosp showed Creat 2.01 ==> 1.38 ~  Labs 10/12 showed BUN=39, Creat=1.9 & we rec that she decr Lasix to 20mg /d. ~  Labs 12/12 on Lasix20 showed BUN=37, Creat=1.6.Marland KitchenMarland Kitchen BP is wnl, continue same meds.  UTI'S, HX OF (ICD-V13.00)  VITAMIN D DEFICIENCY (ICD-268.9) - she is likely osteoporotic but unmeasured due to refusal of BMD & Bisphos therapy options... ~  labs 6/09 showed Vit D level = 26... rec to take OTC Vit D 1000 u daily... ~  labs 6/10 showed Vit D level = 24... rec to incr Vit D to 2000 u daily.  Hx of STROKE (ICD-434.91) - hx right brain stroke in 1992 w/ CTBrain showing right post temporal & parietal low density area w/ encephalomalacia... ~  She remains  on ASA 81mg /d & TICLID 250mg Bid w/o acute cerebral ischemic symptoms... ~  12/12: she presented w/ some behavioral changes but no focal neuro deficits; they requested further eval after 24yrs of refusing f/u scans, dopplers, etc... ~  MRI Brain 12/12 showed large remote right hemispheric infarct w/ encephalomalacia, prominent sm vessel dis, global atrophy, no hemorrhage or mass, sinus mucosal thickening, CSpine spondylosis... ~  CDopplers 1/13 showed mild mixed plaque w/ 40-59% (low end) bilat ICA stenoses, the prev right CAE & patch angioplasty were patent... ~  1/13: she saw DrWong for Neuro & his note is reviewed> he felt some of her recent motor problems were related to her visual changes & there was no evid for new stroke...  SENILE DEMENTIA (ICD-290.0) ~  2/13:  Due to behavioral changes and the family's growing frustration we decided to try Depakote 125mg  Bid...  ANEMIA (ICD-285.9) & PERNICIOUS ANEMIA (ICD-281.0) - on monthly B12 shots for years... in 2006 she was also iron deficient and received IV iron therapy (she refused GI eval)... on FeSO4 daily... she feels well and denies weakness, fatigue, bleeding, dark stools, etc... ~  labs 3/08 showed Hg= 13.2 ~  labs 6/09 showed Hg= 12.7 ~  labs 6/10 showed Hg= 11.7... Fe, B12, Folate were all WNL. ~  labs 12/10 showed Hg= 12.4 ~  labs 12/11 showed Hg= 12.2, Fe=106 (34%sat)... ~  Labs 3/12 Hosp showed Hg= 12.2 ==> 10.4 ~  Labs 10/12 showed Hg= 11.3, MCV= 96 ~  Labs 12/12 showed Hg=11.3 w/ MCV=96, Fe=56 (23%sat); Rec to continue same Fe/ VitC, B12 shots...  NOTE>> She has repeatedly refused GYN exams, PAP smears, Mammograms, & Colonoscopy...   Past Surgical History  Procedure Date  . Cholecystectomy   . Carotid endarterectomy     Outpatient Encounter Prescriptions as of 06/26/2011  Medication Sig Dispense Refill  . acetaminophen (TYLENOL) 500 MG tablet Take 500 mg by mouth every 6 (six)  hours as needed.        Marland Kitchen aspirin 81 MG tablet  Take 81 mg by mouth daily.        Marland Kitchen atorvastatin (LIPITOR) 10 MG tablet TAKE 1 TABLET EVERY DAY  30 tablet  5  . cetirizine (ZYRTEC) 10 MG tablet Take 10 mg by mouth daily.        . Cholecalciferol (EQL VITAMIN D3) 1000 UNITS tablet Take 1,000 Units by mouth daily.        . [facility] RANITIDINE 75 MG tablet TAKE ONE TABLET BY MOUTH AT BEDTIME  30 tablet  9  . cyanocobalamin (,VITAMIN B-12,) 1000 MCG/ML injection Inject 1,000 mcg into the muscle every 30 (thirty) days.        . furosemide (LASIX) 20 MG tablet Take 1 tablet (20 mg total) by mouth daily.  30 tablet  11  . iron polysaccharides (NIFEREX) 150 MG capsule Take 150 mg by mouth daily.        . Multiple Vitamin (MULTIVITAMIN) capsule Take 1 capsule by mouth daily.        . sodium chloride (OCEAN) 0.65 % nasal spray Place 1 spray into the nose at bedtime.      . ticlopidine (TICLID) 250 MG tablet TAKE ONE TABLET BY MOUTH TWICE DAILY  60 tablet  11  . vitamin C (ASCORBIC ACID) 500 MG tablet Take 500 mg by mouth daily. To take along with the iron tablet daily       . DISCONTD: furosemide (LASIX) 20 MG tablet Take 20 mg by mouth daily.        Marland Kitchen DISCONTD: divalproex (DEPAKOTE) 250 MG DR tablet Take 1/2 tablet by mouth twice daily  30 tablet  5  . DISCONTD: fluticasone (VERAMYST) 27.5 MCG/SPRAY nasal spray Place 2 sprays into the nose daily.        . cyanocobalamin ((VITAMIN B-12)) injection 1,000 mcg         Allergies  Allergen Reactions  . Procaine Hcl     REACTION: pt states reaction to shot at dentist office w/ tachycardia \\T \ SOB (? if really from combination w/epi)    Current Medications, Allergies, Past Medical History, Past Surgical History, Family History, and Social History were reviewed in [employer] record.      Review of Systems       See HPI - all other systems neg except as noted...      The patient complains of decreased hearing, dyspnea on exertion, and difficulty walking.  The patient denies anorexia,  fever, weight loss, weight gain, vision loss, hoarseness, chest pain, syncope, peripheral edema, prolonged cough, headaches, hemoptysis, abdominal pain, melena, hematochezia, severe indigestion/heartburn, hematuria, incontinence, muscle weakness, suspicious skin lesions, transient blindness, depression, unusual weight change, abnormal bleeding, enlarged lymph nodes, and angioedema.     Objective:   Physical Exam     WD WN 78 y/o WF in NAD...  GENERAL:  Alert & oriented x 2;  pleasant & cooperative... HEENT:  [place]/AT, EOM-wnl, PERRLA, Fundi-benign, EACs-clear, TMs-wnl, NOSE-clear, THROAT-clear but dentition is very poor. NECK:  Supple w/ adeq ROM; no JVD; carotid impulses OK w/o bruits, +CAE scar on Rt; no thyromegaly or nodules palpated; no lymphadenopathy. CHEST:  Clear to P & A; without wheezes/ rales/ or rhonchi. HEART:  Regular Rhythm; without murmurs/ rubs/ or gallops. ABDOMEN:  Soft & nontender; normal bowel sounds; no organomegaly or masses detected. EXT: without deformities or arthritic changes; she has VI changes w/ 2+edema on Lft &  1+edema on Rt NEURO:  CN's intact; ?no focal neuro deficits (DrWong detected a field cut & some left sided weakness). DERM:  No lesions noted; no rash- just the bruises & abrasion from fall.  RADIOLOGY DATA:  Reviewed in the EPIC EMR & discussed w/ the patient...  LABORATORY DATA:  Reviewed in the EPIC EMR & discussed w/ the patient...   Assessment & Plan:   Cerebrovasc Dis>  On the ASA/ Ticlid & she does not want to change this med, therefore continue same; I gave them the option of switching to ASA/ Plavix.  VI. Edema>  She is reminded to take the Lasix20 every day, avoid salt, elev legs, wear support hose; and incr free water intake...  CHOL>  On Lip10 & f/u FLP showed Chol numbers ok, but elev TG=240 & needs much better low fat diet...  HH/ GERD>  She takes Zantac 150mg /d & doing quite well, no symptoms she says...  Renal Insuffic>  Creat is  elev at 1.6 on Lasix20 but this is improved from prev 1.9 on Lasix40; continue same, low sodium, incr fluid intake...  Hx Stroke & Dementia>  MRI 12/12 shows extensive chronic changes but NAD; they want to continue ASA/ Ticlid; behavioral changes noted are likely a manifestation of her senile dementia> offered Aricept, Ativan, etc but they are ambivalent & will decide & let me know; we finally decided to try Depakote 125mg  Bid to see if it helps to calm her down...  Anemia & B12 Defic>  On monthly B12 shots & Fe daily;  Continue same.Marland KitchenMarland Kitchen

## 2011-07-31 ENCOUNTER — Ambulatory Visit (INDEPENDENT_AMBULATORY_CARE_PROVIDER_SITE_OTHER): Payer: Medicare Other

## 2011-07-31 DIAGNOSIS — D649 Anemia, unspecified: Secondary | ICD-10-CM

## 2011-08-01 ENCOUNTER — Encounter: Payer: Self-pay | Admitting: Adult Health

## 2011-08-01 ENCOUNTER — Ambulatory Visit (INDEPENDENT_AMBULATORY_CARE_PROVIDER_SITE_OTHER): Payer: Medicare Other | Admitting: Adult Health

## 2011-08-01 ENCOUNTER — Telehealth: Payer: Self-pay

## 2011-08-01 ENCOUNTER — Telehealth: Payer: Self-pay | Admitting: Pulmonary Disease

## 2011-08-01 VITALS — BP 126/78 | HR 65 | Temp 98.0°F | Ht 64.0 in | Wt 138.0 lb

## 2011-08-01 DIAGNOSIS — W19XXXA Unspecified fall, initial encounter: Secondary | ICD-10-CM

## 2011-08-01 DIAGNOSIS — D649 Anemia, unspecified: Secondary | ICD-10-CM

## 2011-08-01 DIAGNOSIS — Z9181 History of falling: Secondary | ICD-10-CM

## 2011-08-01 DIAGNOSIS — R296 Repeated falls: Secondary | ICD-10-CM

## 2011-08-01 MED ORDER — CYANOCOBALAMIN 1000 MCG/ML IJ SOLN
1000.0000 ug | Freq: Once | INTRAMUSCULAR | Status: AC
  Administered 2011-08-01: 1000 ug via INTRAMUSCULAR

## 2011-08-01 NOTE — Patient Instructions (Addendum)
It does not appear you have anything broken.  May take tylenol As needed  Pain  Please contact office for sooner follow up if symptoms do not improve or worsen or seek emergency care  We will send order for a walker .

## 2011-08-01 NOTE — Telephone Encounter (Signed)
Spoke with Hassan Rowan, she states that the pt had a fall today and is "sore all over" requesting to be seen. OV with TP at 3 pm today.

## 2011-08-01 NOTE — Telephone Encounter (Signed)
LMTCB

## 2011-08-01 NOTE — Telephone Encounter (Signed)
PT WAS SEEN TODAY BY TP. NOTHING FURTHER NEEDED AT THIS TIME Carrie Grimes

## 2011-08-01 NOTE — Assessment & Plan Note (Signed)
S/p fall with soreness and bruising  No obvious fx with ability to wear bear. Exam is unrevealing.   Plan;  Tylenol As needed   Walker As needed   Please contact office for sooner follow up if symptoms do not improve or worsen or seek emergency care

## 2011-08-01 NOTE — Progress Notes (Signed)
Subjective:    Patient ID: Carrie Grimes, female    DOB: 1920/06/05, 78 y.o.   MRN: [id number]  HPI 78 y/o WF with known hx of cerebrovasc dis & stroke w/ prev right CAE in 1992;  chr ven insuffic w/ edema;  Hyperchol;  large HH;  mild renal insuffic &  hx UTIs;  DJD & Vit D defic;  senile dementia;  anemia...  ~  July 05, 2010:  She was Hosp at [facility] 3/1 - 06/30/10 w/ gastroent (n/v/d) & dehydration (creat 2.0 ==> 1.4 after hydration);  also had UTI w/ sens EColi (given Rocephin IV inhosp) and anemia (Hg=10-11 range)... here for f/u & improved> never had any urinary symptoms & we decided to treat outpt w/ Cipro to be sure it is resolved;  f/u labs today showed Creat=1.4 at baseline, Hg=11.5 improved... she has restarted her Lasix at 40mg  Qam >>  ~  October 23, 2010:  32mo ROV & [name] has no complaints as usual, cheerful, laughing, feeling OK, etc;  Exam shows VI & 2+edema & we discussed taking Lasix40 every day, no salt, elevation, support hose, etc;  We reviewed prev XRays & labs from 3/12 [place]...  ~  January 29, 2011:  61mo ROV & review of mult medical problems:    Cerebrovasc dis, Hx Stroke>  On ASA & Ticlid; she has been stable, in good spirits, w/o cerebral ischemic symptoms on these meds...    VI/ Edema>  On low sodium diet & Lasix40; wt is stable at 140# w/ VI changes and trace-1+ edema in LEs...    Chol>  On Lip10; FLP today showed TChol 151, TG 240, HDL 44, LDL 75; continue same med & better low fat diet!    HH>  On Antireflux regimen & Ranitadine; she has large HH seen on prev CT scan; denies CP, Abd pain, N/ V, dysphagia etc...    Renal Insuffic>  Baseline Creat= 1.4-1.5; labs today showed BUN=39, Creat=1.9; rec to decr Lasix40 to 1/2 tab daily & incr po fluids.    Vit D Defic>  On MVI + VitD 2000u daily; encouraged to take the vit D every day...    Senile dementia>  She is pleasantly demented & cared for beautifully by her family...    Hx Anemia>  On MVI + Niferex; labs showed Hg=  11.3, MCV=96; rec to continue her Fe, VitC & supplements...    Vit B12 Defic>  On B12 shots monthly...  ~  April 15, 2011:  44mo ROV & add-on for concern over "mini stroke"> family noted some change in behavior over the last 2wks- pt walking around furniture in an odd way, getting her meds confused (family should have been doing the meds for her all along), and saw her optometrist recently w/ cataracts found but no need for surg yet (?he saw hemorrhage in back of eye and questioned mini-strokes) so they requested this appt;  Recall hx right brain stroke 1992 w/ extensive area of encephalomalacia, & carotid art dis w/ right CAE 1992 by [facility] & taking ASA/ Ticlid ever since (Note: they have refused to consider ch in meds, f/u Brain scans, or f/u CDopplers)...  They have now agreed to further testing & we will f/u LABS, MRI Brain, CDopplers ==> pending...    >MRI Brain 04/19/11 showed large remote right hemispheric infarct w/ encephalomalacia, prominent sm vessel dis, global atrophy, no hemorrhage or mass, sinus mucosal thickening, CSpine spondylosis...    Note: at her last visit> baseline Creat  noted to be 1.4-1.5; labs 10/12 showed BUN=39, Creat=1.9 so we decr Lasix40 to 1/2 tab daily & incr po fluids;  f/u blood work on the Lasix 20mg /d dose now shows BUN=37, Creat=1.6;  BP remains good at 128/72 today...    Hx Anemia w/ Fe defic & B12 defic on oral Iron & B12 shots monthly xyrs;  Labs today shows Hg=11.3 (stable) w/ MCV=96, Fe=56 (23%sat);  Rec to continue same Fe/ VitC, B12 shots...  ~  June 26, 2011:  69mo ROV & add-on at daugh request for behavioral changes assoc w/ her senile dementia> [name] saw DrWong for Neuro 1/13 & he felt that her abrupt changes noted by family were related to her sudden visual change (?hemorrhage noted by Optometrist & at [facility]) and not evid for additional stroke;  CDopplers 1/13 showed mild mixed plaque w/ 40-59% (low end) bilat ICA stenoses, the prev right CAE & patch  angioplasty were patent;  Since her last visit the family has noted some personality changes and agitation which is in stark contrast to her usual very sweet & cooperative demeanor;  She is apparently sl paranoid & daugh wants her checked for a chemical imbalance;  We reviewed avail options and decided to give her a trial of Depakote 125mg Bid> the family understands that they must take control of distrib all meds to the pt;  We reviewed all prev labs/ XRays/ Scans/ etc in EPIC... We will recheck her in 6-8weeks.   08/01/2011 Acute OV  Complains of fall this am.  Pt fell this morning landing on her right side. Complains of  right arm, left leg/foot and achiness all over body. pt has brusing on right arm/hand. Son-in-law found her laying in the kitchen. Pt was alone x 20-36mins. Neg for LOC. Was able to bear weight. She lost balance.  Family is requesting a walker. No headache, speech/visual changes.  No n/v/d , chest pain or dyspnea.           Problem List:    ALLERGIC RHINITIS (ICD-477.9) - she uses Claritin & Flonase as needed... she has 2 cats- Heidi & Aline Brochure!  HEARING LOSS (ICD-389.9) - she doesn't have hearing aides- "it wouldn't work"...  DENTAL CARIES (ICD-521.00) - she has been repeatedly reminded to seek dental help, but refuses... family purees her foods...  CEREBROVASCULAR DISEASE (ICD-437.9) - s/p right CAE by DrLawson 1992> stable on ASA 81mg /d and TICLID 250mg Bid ever since then... she had repeatedly refused f/u CDopplers etc for years... she had been doing well and was asymptomatic- denying focal weakness, numbness, paresthesia, pain, slurred speech, headache, dizziness, tremor, or seizure. ~  12/12: presented w/ some behavior change & family requested further eval:  MRI 12/12 showed large remote right hemispheric infarct w/ encephalomalacia, prominent sm vessel dis, global atrophy, no hemorrhage or mass, sinus mucosal thickening, CSpine spondylosis...   CDopplers 1/13 showed mild  mixed plaque w/ 40-59% (low end) bilat ICA stenoses, the prev right CAE & patch angioplasty were patent... ~  1/13:  She saw DrWong for Neuro & his note is reviewed> he felt some of her recent motor problems were related to her visual changes & there was no evid for new stroke...  VENOUS INSUFFICIENCY, CHRONIC (ICD-459.81) - she has chronic LE edema- on LASIX 40mg /d now and low sodium diet... she had gained 23# to 152# in 2009 w/ Ensure & puree diet from famiy, now down to 140#... advises on low sodium, elevate legs, support hose... ~  6/11:  BUN=40, Creat=1.6 on  the Lasix80 & asked to decr to 40mg /d and restrict sodium. ~  12/11:  labs showed BUN=41, Creat=1.5 on Lasix40... Baseline Creat ~1.4-1.5 ~  10/12:  Labs showed BUN=39, Creat=1.9.Marland KitchenMarland Kitchen rec to decr lasix further to 1/2 tab daily...  HYPERCHOLESTEROLEMIA (ICD-272.0) - on diet + LIPITOR 10mg /d... her TG's have been elevated but she refuses more meds. ~  labs 3/08 showed TChol 183, TG 205, HDL 54, LDL 98 ~  labs 6/09 showed TChol 190, TG 314, HDL 43, LDL 94... she refuses more meds, rec low fat diet. ~  labs 6/10 showed TChol 184, TG 279, HDL 50, LDL 97 ~  labs 6/11 showed TChol 180, TG 277, HDL 49, LDL 95 ~  FLP 12/11 showed TChol 168. TH 184, HDL50, LDL 81  HIATAL HERNIA (ICD-553.3) - large HH seen on prev CT Abd in 2001... takes ZANTAC 75mg  Qhs...  RENAL INSUFFICIENCY (ICD-588.9) - Creat= 1.5 to 1.7 in 2008... ~  labs 6/09 showed BUN= 33, Creat= 1.4 ~  labs 6/10 showed BUN= 36, Creat= 1.5 ~  labs 12/10 showed BUN= 33, Creat= 1.4 ~  labs 6/11 showed BUN=40, Creat=1.6.Marland KitchenMarland Kitchen rec to decr Lasix from 80 to 40mg /d. ~  labs 12/11 showed BUN=41, creat=1.5 ~  Labs 3/12 Hosp showed Creat 2.01 ==> 1.38 ~  Labs 10/12 showed BUN=39, Creat=1.9 & we rec that she decr Lasix to 20mg /d. ~  Labs 12/12 on Lasix20 showed BUN=37, Creat=1.6.Marland KitchenMarland Kitchen BP is wnl, continue same meds.  UTI'S, HX OF (ICD-V13.00)  VITAMIN D DEFICIENCY (ICD-268.9) - she is likely  osteoporotic but unmeasured due to refusal of BMD & Bisphos therapy options... ~  labs 6/09 showed Vit D level = 26... rec to take OTC Vit D 1000 u daily... ~  labs 6/10 showed Vit D level = 24... rec to incr Vit D to 2000 u daily.  Hx of STROKE (ICD-434.91) - hx right brain stroke in 1992 w/ CTBrain showing right post temporal & parietal low density area w/ encephalomalacia... ~  She remains on ASA 81mg /d & TICLID 250mg Bid w/o acute cerebral ischemic symptoms... ~  12/12: she presented w/ some behavioral changes but no focal neuro deficits; they requested further eval after 49yrs of refusing f/u scans, dopplers, etc... ~  MRI Brain 12/12 showed large remote right hemispheric infarct w/ encephalomalacia, prominent sm vessel dis, global atrophy, no hemorrhage or mass, sinus mucosal thickening, CSpine spondylosis... ~  CDopplers 1/13 showed mild mixed plaque w/ 40-59% (low end) bilat ICA stenoses, the prev right CAE & patch angioplasty were patent... ~  1/13: she saw DrWong for Neuro & his note is reviewed> he felt some of her recent motor problems were related to her visual changes & there was no evid for new stroke...  SENILE DEMENTIA (ICD-290.0) ~  2/13:  Due to behavioral changes and the family's growing frustration we decided to try Depakote 125mg  Bid...  ANEMIA (ICD-285.9) & PERNICIOUS ANEMIA (ICD-281.0) - on monthly B12 shots for years... in 2006 she was also iron deficient and received IV iron therapy (she refused GI eval)... on FeSO4 daily... she feels well and denies weakness, fatigue, bleeding, dark stools, etc... ~  labs 3/08 showed Hg= 13.2 ~  labs 6/09 showed Hg= 12.7 ~  labs 6/10 showed Hg= 11.7... Fe, B12, Folate were all WNL. ~  labs 12/10 showed Hg= 12.4 ~  labs 12/11 showed Hg= 12.2, Fe=106 (34%sat)... ~  Labs 3/12 Hosp showed Hg= 12.2 ==> 10.4 ~  Labs 10/12 showed Hg= 11.3, MCV= 96 ~  Labs 12/12 showed Hg=11.3 w/ MCV=96, Fe=56 (23%sat); Rec to continue same Fe/ VitC, B12  shots...  NOTE>> She has repeatedly refused GYN exams, PAP smears, Mammograms, & Colonoscopy...   Past Surgical History  Procedure Date  . Cholecystectomy   . Carotid endarterectomy     Outpatient Encounter Prescriptions as of 08/01/2011  Medication Sig Dispense Refill  . acetaminophen (TYLENOL) 500 MG tablet Take 500 mg by mouth every 6 (six) hours as needed.        Marland Kitchen aspirin 81 MG tablet Take 81 mg by mouth daily.        Marland Kitchen atorvastatin (LIPITOR) 10 MG tablet TAKE 1 TABLET EVERY DAY  30 tablet  5  . cetirizine (ZYRTEC) 10 MG tablet Take 10 mg by mouth daily.        . Cholecalciferol (EQL VITAMIN D3) 1000 UNITS tablet Take 1,000 Units by mouth daily.        . [facility] RANITIDINE 75 MG tablet TAKE ONE TABLET BY MOUTH AT BEDTIME  30 tablet  9  . cyanocobalamin (,VITAMIN B-12,) 1000 MCG/ML injection Inject 1,000 mcg into the muscle every 30 (thirty) days.        . Dextromethorphan Polistirex (DELSYM PO) Take by mouth 2 (two) times daily as needed. Take as directed.      . divalproex (DEPAKOTE) 125 MG DR tablet Take 125 mg by mouth daily.       . furosemide (LASIX) 20 MG tablet Take 1 tablet (20 mg total) by mouth daily.  30 tablet  11  . iron polysaccharides (NIFEREX) 150 MG capsule Take 150 mg by mouth daily.        . Multiple Vitamin (MULTIVITAMIN) capsule Take 1 capsule by mouth daily.        . sodium chloride (OCEAN) 0.65 % nasal spray Place 1 spray into the nose at bedtime.      . ticlopidine (TICLID) 250 MG tablet TAKE ONE TABLET BY MOUTH TWICE DAILY  60 tablet  11  . vitamin C (ASCORBIC ACID) 500 MG tablet Take 500 mg by mouth daily. To take along with the iron tablet daily        Facility-Administered Encounter Medications as of 08/01/2011  Medication Dose Route Frequency Provider Last Rate Last Dose  . cyanocobalamin ((VITAMIN B-12)) injection 1,000 mcg  1,000 mcg Intramuscular Once Noralee Space, MD   1,000 mcg at 08/01/11 [id number]    Allergies  Allergen Reactions  . Procaine Hcl      REACTION: pt states reaction to shot at dentist office w/ tachycardia \\T \ SOB (? if really from combination w/epi)    Current Medications, Allergies, Past Medical History, Past Surgical History, Family History, and Social History were reviewed in [employer] record.      Review of Systems       Constitutional:   No  weight loss, night sweats,  Fevers, chills, + fatigue, or  lassitude.  HEENT:   No headaches,  Difficulty swallowing,  Tooth/dental problems, or  Sore throat,                No sneezing, itching, ear ache, nasal congestion, post nasal drip,   CV:  No chest pain,  Orthopnea, PND,  anasarca, dizziness, palpitations, syncope.   GI  No heartburn, indigestion, abdominal pain, nausea, vomiting, diarrhea, change in bowel habits, loss of appetite, bloody stools.   Resp: No shortness of breath with exertion or at rest.  No excess mucus, no productive cough,  No non-productive cough,  No coughing up of blood.  No change in color of mucus.  No wheezing.  No chest wall deformity  Skin: no rash or lesions.  GU: no dysuria, change in color of urine, no urgency or frequency.  No flank pain, no hematuria   MS:  No joint pain or swelling.  No decreased range of motion.  No back pain.  Psych:  No change in mood or affect. No depression or anxiety.         Objective:   Physical Exam     WD WN 77 y/o WF in NAD...  GENERAL:  Alert    pleasant & cooperative... HEENT:  [place]/AT,  EACs-clear, TMs-wnl, NOSE-clear, THROAT-clear but dentition is very poor. NECK:  Supple w/ adeq ROM; no JVD; carotid impulses OK w/o bruits, +CAE scar on Rt; no thyromegaly or nodules palpated; no lymphadenopathy. CHEST:  Clear to P & A; without wheezes/ rales/ or rhonchi. HEART:  Regular Rhythm; without murmurs/ rubs/ or gallops. ABDOMEN:  Soft & nontender; normal bowel sounds; no organomegaly or masses detected. EXT: without deformities or arthritic changes; she has VI changes  With tr  -1+ edema  MAEWx 4 , joint ROM nml w/ no deformity  NEURO:  CN's intact;   DERM:  No lesions noted; no rash- just the bruises fall.       Assessment & Plan:

## 2011-09-04 ENCOUNTER — Encounter: Payer: Self-pay | Admitting: Pulmonary Disease

## 2011-09-04 ENCOUNTER — Ambulatory Visit (INDEPENDENT_AMBULATORY_CARE_PROVIDER_SITE_OTHER): Payer: Medicare Other | Admitting: Pulmonary Disease

## 2011-09-04 VITALS — BP 140/80 | HR 67 | Temp 97.1°F | Ht 64.0 in | Wt 137.4 lb

## 2011-09-04 DIAGNOSIS — N259 Disorder resulting from impaired renal tubular function, unspecified: Secondary | ICD-10-CM

## 2011-09-04 DIAGNOSIS — H9190 Unspecified hearing loss, unspecified ear: Secondary | ICD-10-CM

## 2011-09-04 DIAGNOSIS — I6350 Cerebral infarction due to unspecified occlusion or stenosis of unspecified cerebral artery: Secondary | ICD-10-CM

## 2011-09-04 DIAGNOSIS — E7800 Pure hypercholesterolemia, unspecified: Secondary | ICD-10-CM

## 2011-09-04 DIAGNOSIS — D510 Vitamin B12 deficiency anemia due to intrinsic factor deficiency: Secondary | ICD-10-CM

## 2011-09-04 DIAGNOSIS — I679 Cerebrovascular disease, unspecified: Secondary | ICD-10-CM

## 2011-09-04 DIAGNOSIS — I872 Venous insufficiency (chronic) (peripheral): Secondary | ICD-10-CM

## 2011-09-04 DIAGNOSIS — F0390 Unspecified dementia without behavioral disturbance: Secondary | ICD-10-CM

## 2011-09-04 MED ORDER — CYANOCOBALAMIN 1000 MCG/ML IJ SOLN: 1000.0000 ug | Freq: Once | INTRAMUSCULAR | Status: DC

## 2011-09-04 MED ORDER — CYANOCOBALAMIN 1000 MCG/ML IJ SOLN
1000.0000 ug | Freq: Once | INTRAMUSCULAR | Status: AC
  Administered 2011-09-04: 1000 ug via INTRAMUSCULAR

## 2011-09-04 NOTE — Patient Instructions (Signed)
Today we updated your med list in our EPIC system...    Continue your current medications the same...  We gave you your B12 shot today...  Call for any problems...  Let's plan a follow up visit in about 4 months.Marland KitchenMarland Kitchen

## 2011-09-05 ENCOUNTER — Encounter: Payer: Self-pay | Admitting: Pulmonary Disease

## 2011-09-05 NOTE — Progress Notes (Signed)
Subjective:    Patient ID: Carrie Grimes, female    DOB: 05-12-1920, 78 y.o.   MRN: [id number]  HPI 78 y/o WF here for a follow up visit... she has mult med problems including cerebrovasc dis & stroke w/ prev right CAE in 1992;  chr ven insuffic w/ edema;  Hyperchol;  large HH;  mild renal insuffic &  hx UTIs;  DJD & Vit D defic;  senile dementia;  anemia...  ~  January 29, 2011:  77mo ROV & review of mult medical problems:    Cerebrovasc dis, Hx Stroke>  On ASA & Ticlid; she has been stable, in good spirits, w/o cerebral ischemic symptoms on these meds...    VI/ Edema>  On low sodium diet & Lasix40; wt is stable at 140# w/ VI changes and trace-1+ edema in LEs...    Chol>  On Lip10; FLP today showed TChol 151, TG 240, HDL 44, LDL 75; continue same med & better low fat diet!    HH>  On Antireflux regimen & Ranitadine; she has large HH seen on prev CT scan; denies CP, Abd pain, N/ V, dysphagia etc...    Renal Insuffic>  Baseline Creat= 1.4-1.5; labs today showed BUN=39, Creat=1.9; rec to decr Lasix40 to 1/2 tab daily & incr po fluids.    Vit D Defic>  On MVI + VitD 2000u daily; encouraged to take the vit D every day...    Senile dementia>  She is pleasantly demented & cared for beautifully by her family...    Hx Anemia>  On MVI + Niferex; labs showed Hg= 11.3, MCV=96; rec to continue her Fe, VitC & supplements...    Vit B12 Defic>  On B12 shots monthly...  ~  April 15, 2011:  38mo ROV & add-on for concern over "mini stroke"> family noted some change in behavior over the last 2wks- pt walking around furniture in an odd way, getting her meds confused (family should have been doing the meds for her all along), and saw her optometrist recently w/ cataracts found but no need for surg yet (?he saw hemorrhage in back of eye and questioned mini-strokes) so they requested this appt;  Recall hx right brain stroke 1992 w/ extensive area of encephalomalacia, & carotid art dis w/ right CAE 1992 by  [facility] & taking ASA/ Ticlid ever since (Note: they have refused to consider ch in meds, f/u Brain scans, or f/u CDopplers)...  Now they have now agreed to further testing & we will f/u LABS, MRI Brain, CDopplers ==> pending...  MRI Brain 04/19/11 showed large remote right hemispheric infarct w/ encephalomalacia, prominent sm vessel dis, global atrophy, no hemorrhage or mass, sinus mucosal thickening, CSpine spondylosis...  CDopplers 1/13 showed patent right CAE w/ mild soft plaque in bulb; mild mixed plaque on left bulb & bifurcation w/ 40-59% bilat ICA stenoses (no change)...  Note: at her last visit> baseline Creat noted to be 1.4-1.5; labs 10/12 showed BUN=39, Creat=1.9 so we decr Lasix40 to 1/2 tab daily & incr po fluids;  f/u blood work on the Lasix 20mg /d dose now shows BUN=37, Creat=1.6;  BP remains good at 128/72 today...  Hx Anemia w/ Fe defic & B12 defic on oral Iron & B12 shots monthly xyrs;  Labs today shows Hg=11.3 (stable) w/ MCV=96, Fe=56 (23%sat);  Rec to continue same Fe/ VitC, B12 shots...  ~  June 26, 2011:  15mo ROV & add-on at daugh request for behavioral changes assoc w/ her senile dementia> Carrie Grimes saw  DrWong for Neuro 1/13 & he felt that her abrupt changes noted by family were related to her sudden visual change (?hemorrhage noted by Optometrist & at [facility]) and not evid for additional stroke;  CDopplers 1/13 showed mild mixed plaque w/ 40-59% (low end) bilat ICA stenoses, the prev right CAE & patch angioplasty were patent;  Since her last visit the family has noted some personality changes and agitation which is in stark contrast to her usual very sweet & cooperative demeanor;  She is apparently sl paranoid & daugh wants her checked for a chemical imbalance;  We reviewed avail options and decided to give her a trial of Depakote 125mg Bid> the family understands that they must take control of distrib all meds to the pt;  We reviewed all prev labs/ XRays/ Scans/ etc in EPIC... We  will recheck her in 6-8weeks.  ~  Sep 04, 2011:  2-18mo ROV & Carrie Grimes seems mostly back to her sweet, demented, sl loud & HOH self> the Depakote seems to be helping & family is pleased as she is easier to manage now;  She had a fall 4/13 & saw TP w/o apparent injury other than soreness & some bruising but pt notes that she is getting over it;  She also had a hemorrhage behind her right eye & Optometrist sent her to [facility] & he advised against any surg according to the family, we do not have records but apparently there is no change in her vision...  We reviewed her Problems, Meds, XRays, & Labs> see below>>   Problem List:     << PROBLEM LIST UPDATED 09/04/11 >>  ALLERGIC RHINITIS (ICD-477.9) - she uses Claritin & Flonase as needed... she has 2 cats- Carrie Grimes & Carrie Grimes!  HEARING LOSS (ICD-389.9) - she doesn't have hearing aides- "it wouldn't work"...  DENTAL CARIES (ICD-521.00) - she has been repeatedly reminded to seek dental help, but refuses... family purees her foods...  CEREBROVASCULAR DISEASE (ICD-437.9) - s/p right CAE by [facility] 1992> stable on ASA 81mg /d and TICLID 250mg Bid ever since then... she had repeatedly refused f/u CDopplers etc for years... she had been doing well and was asymptomatic- denying focal weakness, numbness, paresthesia, pain, slurred speech, headache, dizziness, tremor, or seizure. ~  12/12: presented w/ some behavior change & family requested further eval:  MRI 12/12 showed large remote right hemispheric infarct w/ encephalomalacia, prominent sm vessel dis, global atrophy, no hemorrhage or mass, sinus mucosal thickening, CSpine spondylosis...   CDopplers 1/13 showed mild mixed plaque w/ 40-59% (low end) bilat ICA stenoses, the prev right CAE & patch angioplasty were patent... ~  1/13:  She saw DrWong for Neuro & his note is reviewed> he felt some of her recent motor problems were related to her visual changes & there was no evid for new stroke...  VENOUS INSUFFICIENCY,  CHRONIC (ICD-459.81) - she has chronic LE edema- on LASIX 40mg /d now and low sodium diet... she had gained 23# to 152# in 2009 w/ Ensure & puree diet from famiy, now down to 140#... advises on low sodium, elevate legs, support hose... ~  6/11:  BUN=40, Creat=1.6 on the Lasix80 & asked to decr to 40mg /d and restrict sodium. ~  12/11:  labs showed BUN=41, Creat=1.5 on Lasix40... Baseline Creat ~1.4-1.5 ~  10/12:  Labs showed BUN=39, Creat=1.9.Marland KitchenMarland Kitchen rec to decr lasix further to 1/2 tab daily...  HYPERCHOLESTEROLEMIA (ICD-272.0) - on diet + LIPITOR 10mg /d... her TG's have been elevated but she refuses more meds. ~  labs 3/08 showed  TChol 183, TG 205, HDL 54, LDL 98 ~  labs 6/09 showed TChol 190, TG 314, HDL 43, LDL 94... she refuses more meds, rec low fat diet. ~  labs 6/10 showed TChol 184, TG 279, HDL 50, LDL 97 ~  labs 6/11 showed TChol 180, TG 277, HDL 49, LDL 95 ~  FLP 12/11 on Lip10 showed TChol 168. TH 184, HDL50, LDL 81 ~  FLP 10/12 on Lip10 showed TChol 151, TG 240, HDL 44, LDL 75 & rec for better low fat diet.  HIATAL HERNIA (ICD-553.3) - large HH seen on prev CT Abd in 2001... takes ZANTAC 75mg  Qhs...  RENAL INSUFFICIENCY (ICD-588.9) - Creat= 1.5 to 1.7 in 2008... ~  labs 6/09 showed BUN= 33, Creat= 1.4 ~  labs 6/10 showed BUN= 36, Creat= 1.5 ~  labs 12/10 showed BUN= 33, Creat= 1.4 ~  labs 6/11 showed BUN=40, Creat=1.6.Marland KitchenMarland Kitchen rec to decr Lasix from 80 to 40mg /d. ~  labs 12/11 showed BUN=41, creat=1.5 ~  Labs 3/12 Hosp showed Creat 2.01 ==> 1.38 ~  Labs 10/12 showed BUN=39, Creat=1.9 & we rec that she decr Lasix to 20mg /d. ~  Labs 12/12 on Lasix20 showed BUN=37, Creat=1.6.Marland KitchenMarland Kitchen BP is wnl, continue same meds.  UTI'S, HX OF (ICD-V13.00)  VITAMIN D DEFICIENCY (ICD-268.9) - she is likely osteoporotic but unmeasured due to refusal of BMD & Bisphos therapy options... ~  labs 6/09 showed Vit D level = 26... rec to take OTC Vit D 1000 u daily... ~  labs 6/10 showed Vit D level = 24... rec to incr  Vit D to 2000 u daily.  Hx of STROKE (ICD-434.91) - hx right brain stroke in 1992 w/ CTBrain showing right post temporal & parietal low density area w/ encephalomalacia... ~  She remains on ASA 81mg /d & TICLID 250mg Bid w/o acute cerebral ischemic symptoms... ~  12/12: she presented w/ some behavioral changes but no focal neuro deficits; they requested further eval after 9yrs of refusing f/u scans, dopplers, etc... ~  MRI Brain 12/12 showed large remote right hemispheric infarct w/ encephalomalacia, prominent sm vessel dis, global atrophy, no hemorrhage or mass, sinus mucosal thickening, CSpine spondylosis... ~  CDopplers 1/13 showed mild mixed plaque w/ 40-59% (low end) bilat ICA stenoses, the prev right CAE & patch angioplasty were patent... ~  1/13: she saw DrWong for Neuro & his note is reviewed> he felt some of her recent motor problems were related to her visual changes & there was no evid for new stroke...  SENILE DEMENTIA (ICD-290.0) ~  2/13:  Due to behavioral changes and the family's growing frustration we decided to try Depakote 125mg  Bid... ~  5/13:  They report improvement in behavior back toward baseline, hopefully this will continue, same Rx...  ANEMIA (ICD-285.9) & PERNICIOUS ANEMIA (ICD-281.0) - on monthly B12 shots for years... in 2006 she was also iron deficient and received IV iron therapy (she refused GI eval)... on FeSO4 daily... she feels well and denies weakness, fatigue, bleeding, dark stools, etc... ~  labs 3/08 showed Hg= 13.2 ~  labs 6/09 showed Hg= 12.7 ~  labs 6/10 showed Hg= 11.7... Fe, B12, Folate were all WNL. ~  labs 12/10 showed Hg= 12.4 ~  labs 12/11 showed Hg= 12.2, Fe=106 (34%sat)... ~  Labs 3/12 Hosp showed Hg= 12.2 ==> 10.4 ~  Labs 10/12 showed Hg= 11.3, MCV= 96 ~  Labs 12/12 showed Hg=11.3 w/ MCV=96, Fe=56 (23%sat); Rec to continue same Fe/ VitC, B12 shots...  NOTE>> She has repeatedly refused  GYN exams, PAP smears, Mammograms, &  Colonoscopy...   Past Surgical History  Procedure Date  . Cholecystectomy   . Carotid endarterectomy     Outpatient Encounter Prescriptions as of 09/04/2011  Medication Sig Dispense Refill  . acetaminophen (TYLENOL) 500 MG tablet Take 2 tablet by mouth at bedtime      . aspirin 81 MG tablet Take 81 mg by mouth daily.        Marland Kitchen atorvastatin (LIPITOR) 10 MG tablet TAKE 1 TABLET EVERY DAY  30 tablet  5  . cetirizine (ZYRTEC) 10 MG tablet Take 10 mg by mouth daily.        . Cholecalciferol (EQL VITAMIN D3) 1000 UNITS tablet Take 1,000 Units by mouth daily.        . [facility] RANITIDINE 75 MG tablet TAKE ONE TABLET BY MOUTH AT BEDTIME  30 tablet  9  . Dextromethorphan Polistirex (DELSYM PO) Take by mouth 2 (two) times daily as needed. Take as directed.      . divalproex (DEPAKOTE) 125 MG DR tablet Take 125 mg by mouth daily.       . furosemide (LASIX) 20 MG tablet Take 1 tablet (20 mg total) by mouth daily.  30 tablet  11  . iron polysaccharides (NIFEREX) 150 MG capsule Take 150 mg by mouth daily.        . Multiple Vitamin (MULTIVITAMIN) capsule Take 1 capsule by mouth daily.        . sodium chloride (OCEAN) 0.65 % nasal spray Place 1 spray into the nose at bedtime.      . ticlopidine (TICLID) 250 MG tablet TAKE ONE TABLET BY MOUTH TWICE DAILY  60 tablet  11  . vitamin C (ASCORBIC ACID) 500 MG tablet Take 500 mg by mouth daily. To take along with the iron tablet daily       . DISCONTD: cyanocobalamin (,VITAMIN B-12,) 1000 MCG/ML injection Inject 1,000 mcg into the muscle every 30 (thirty) days.        Marland Kitchen DISCONTD: cyanocobalamin (,VITAMIN B-12,) 1000 MCG/ML injection Inject 1 mL (1,000 mcg total) into the muscle once.  1 mL  0   Facility-Administered Encounter Medications as of 09/04/2011  Medication Dose Route Frequency Provider Last Rate Last Dose  . cyanocobalamin ((VITAMIN B-12)) injection 1,000 mcg  1,000 mcg Intramuscular Once Noralee Space, MD   1,000 mcg at 09/04/11 1501    Allergies   Allergen Reactions  . Procaine Hcl     REACTION: pt states reaction to shot at dentist office w/ tachycardia \\T \ SOB (? if really from combination w/epi)    Current Medications, Allergies, Past Medical History, Past Surgical History, Family History, and Social History were reviewed in [employer] record.      Review of Systems       See HPI - all other systems neg except as noted...      The patient complains of decreased hearing, dyspnea on exertion, and difficulty walking.  The patient denies anorexia, fever, weight loss, weight gain, vision loss, hoarseness, chest pain, syncope, peripheral edema, prolonged cough, headaches, hemoptysis, abdominal pain, melena, hematochezia, severe indigestion/heartburn, hematuria, incontinence, muscle weakness, suspicious skin lesions, transient blindness, depression, unusual weight change, abnormal bleeding, enlarged lymph nodes, and angioedema.     Objective:   Physical Exam     WD WN 78 y/o WF in NAD...  GENERAL:  Alert & oriented x 2;  pleasant & cooperative... HEENT:  /AT, EOM-wnl, PERRLA, Fundi-benign, EACs-clear, TMs-wnl, NOSE-clear,  THROAT-clear but dentition is very poor. NECK:  Supple w/ adeq ROM; no JVD; carotid impulses OK w/o bruits, +CAE scar on Rt; no thyromegaly or nodules palpated; no lymphadenopathy. CHEST:  Clear to P & A; without wheezes/ rales/ or rhonchi. HEART:  Regular Rhythm; without murmurs/ rubs/ or gallops. ABDOMEN:  Soft & nontender; normal bowel sounds; no organomegaly or masses detected. EXT: without deformities or arthritic changes; she has VI changes w/ 2+edema on Lft & 1+edema on Rt NEURO:  CN's intact; ?no focal neuro deficits (DrWong detected a field cut & some left sided weakness). DERM:  No lesions noted; no rash- just the bruises & abrasion from fall.  RADIOLOGY DATA:  Reviewed in the EPIC EMR & discussed w/ the patient...  LABORATORY DATA:  Reviewed in the EPIC EMR & discussed w/ the  patient...   Assessment & Plan:   Cerebrovasc Dis>  On the ASA/ Ticlid & she does not want to change this med, therefore continue same; I gave them the option of switching to ASA/ Plavix.  VI. Edema>  She is reminded to take the Lasix20 every day, avoid salt, elev legs, wear support hose; and incr free water intake...  CHOL>  On Lip10 & f/u FLP showed Chol numbers ok, but elev TG=240 & needs much better low fat diet...  HH/ GERD>  She takes Zantac 150mg /d & doing quite well, no symptoms she says...  Renal Insuffic>  Creat is elev at 1.6 on Lasix20 but this is improved from prev 1.9 on Lasix40; continue same, low sodium, incr fluid intake...  Hx Stroke & Dementia>  MRI 12/12 shows extensive chronic changes but NAD; they want to continue ASA/ Ticlid; behavioral changes noted are likely a manifestation of her senile dementia> offered Aricept, Ativan, etc but they are ambivalent & will decide & let me know; we finally decided to try Depakote 125mg  Bid to see if it helps to calm her down... She is now ambulating w/ walker & family helps...  Anemia & B12 Defic>  On monthly B12 shots & Fe daily;  Continue same...   Patient's Medications  New Prescriptions   No medications on file  Previous Medications   ACETAMINOPHEN (TYLENOL) 500 MG TABLET    Take 2 tablet by mouth at bedtime   ASPIRIN 81 MG TABLET    Take 81 mg by mouth daily.     ATORVASTATIN (LIPITOR) 10 MG TABLET    TAKE 1 TABLET EVERY DAY   CETIRIZINE (ZYRTEC) 10 MG TABLET    Take 10 mg by mouth daily.     CHOLECALCIFEROL (EQL VITAMIN D3) 1000 UNITS TABLET    Take 1,000 Units by mouth daily.     [facility] RANITIDINE 75 MG TABLET    TAKE ONE TABLET BY MOUTH AT BEDTIME   DEXTROMETHORPHAN POLISTIREX (DELSYM PO)    Take by mouth 2 (two) times daily as needed. Take as directed.   DIVALPROEX (DEPAKOTE) 125 MG DR TABLET    Take 125 mg by mouth daily.    FUROSEMIDE (LASIX) 20 MG TABLET    Take 1 tablet (20 mg total) by mouth daily.   IRON  POLYSACCHARIDES (NIFEREX) 150 MG CAPSULE    Take 150 mg by mouth daily.     MULTIPLE VITAMIN (MULTIVITAMIN) CAPSULE    Take 1 capsule by mouth daily.     SODIUM CHLORIDE (OCEAN) 0.65 % NASAL SPRAY    Place 1 spray into the nose at bedtime.   TICLOPIDINE (TICLID) 250 MG TABLET  TAKE ONE TABLET BY MOUTH TWICE DAILY   VITAMIN C (ASCORBIC ACID) 500 MG TABLET    Take 500 mg by mouth daily. To take along with the iron tablet daily   Modified Medications   No medications on file  Discontinued Medications                  Note: SHE CONTINUES ON B12 SHOTS MONTHLY...   CYANOCOBALAMIN (,VITAMIN B-12,) 1000 MCG/ML INJECTION    Inject 1,000 mcg into the muscle every 30 (thirty) days.

## 2011-09-13 ENCOUNTER — Telehealth: Payer: Self-pay | Admitting: Pulmonary Disease

## 2011-09-13 NOTE — Telephone Encounter (Signed)
Called, spoke with pt's daughter, Carrie Grimes, who states pt has been getting b12 injections here.  Last night received call from [facility] stating they have the b12 vial ready there.  Carrie Grimes is confused - ? Is she supposed to be picking up this medication now?  I spoke with Carrie Grimes in Allergy -- we will supply b12 medication and continue to give this to pt here in our office.  Carrie Grimes is aware of this of this and I apologized for any confusion this may have caused.   Called [facility] - was advised they did receive b12 rx from Carrie Grimes on 09/04/11.  Advised we will be supplying pt with this --[facility].

## 2011-09-26 ENCOUNTER — Other Ambulatory Visit: Payer: Self-pay | Admitting: Pulmonary Disease

## 2011-10-03 ENCOUNTER — Ambulatory Visit (INDEPENDENT_AMBULATORY_CARE_PROVIDER_SITE_OTHER): Payer: Medicare Other

## 2011-10-03 DIAGNOSIS — D649 Anemia, unspecified: Secondary | ICD-10-CM

## 2011-10-03 MED ORDER — CYANOCOBALAMIN 1000 MCG/ML IJ SOLN
1000.0000 ug | Freq: Once | INTRAMUSCULAR | Status: AC
  Administered 2011-10-03: 1000 ug via INTRAMUSCULAR

## 2011-10-29 ENCOUNTER — Other Ambulatory Visit: Payer: Self-pay | Admitting: Pulmonary Disease

## 2011-11-01 ENCOUNTER — Ambulatory Visit (INDEPENDENT_AMBULATORY_CARE_PROVIDER_SITE_OTHER): Payer: Medicare Other

## 2011-11-01 DIAGNOSIS — D649 Anemia, unspecified: Secondary | ICD-10-CM

## 2011-11-01 MED ORDER — CYANOCOBALAMIN 1000 MCG/ML IJ SOLN
1000.0000 ug | Freq: Once | INTRAMUSCULAR | Status: AC
  Administered 2011-11-01: 1000 ug via INTRAMUSCULAR

## 2011-11-25 ENCOUNTER — Other Ambulatory Visit: Payer: Self-pay | Admitting: Pulmonary Disease

## 2011-12-02 ENCOUNTER — Ambulatory Visit (INDEPENDENT_AMBULATORY_CARE_PROVIDER_SITE_OTHER): Payer: Medicare Other

## 2011-12-02 DIAGNOSIS — D649 Anemia, unspecified: Secondary | ICD-10-CM

## 2011-12-03 MED ORDER — CYANOCOBALAMIN 1000 MCG/ML IJ SOLN
1000.0000 ug | Freq: Once | INTRAMUSCULAR | Status: AC
  Administered 2011-12-03: 1000 ug via INTRAMUSCULAR

## 2012-01-06 ENCOUNTER — Other Ambulatory Visit (INDEPENDENT_AMBULATORY_CARE_PROVIDER_SITE_OTHER): Payer: Medicare Other

## 2012-01-06 ENCOUNTER — Encounter: Payer: Self-pay | Admitting: Pulmonary Disease

## 2012-01-06 ENCOUNTER — Ambulatory Visit (INDEPENDENT_AMBULATORY_CARE_PROVIDER_SITE_OTHER): Payer: Medicare Other | Admitting: Pulmonary Disease

## 2012-01-06 VITALS — BP 134/66 | HR 64 | Temp 98.0°F | Ht 64.0 in | Wt 141.0 lb

## 2012-01-06 DIAGNOSIS — E538 Deficiency of other specified B group vitamins: Secondary | ICD-10-CM

## 2012-01-06 DIAGNOSIS — F419 Anxiety disorder, unspecified: Secondary | ICD-10-CM

## 2012-01-06 DIAGNOSIS — Z23 Encounter for immunization: Secondary | ICD-10-CM

## 2012-01-06 DIAGNOSIS — R300 Dysuria: Secondary | ICD-10-CM

## 2012-01-06 DIAGNOSIS — I679 Cerebrovascular disease, unspecified: Secondary | ICD-10-CM

## 2012-01-06 DIAGNOSIS — E7800 Pure hypercholesterolemia, unspecified: Secondary | ICD-10-CM

## 2012-01-06 DIAGNOSIS — K449 Diaphragmatic hernia without obstruction or gangrene: Secondary | ICD-10-CM

## 2012-01-06 DIAGNOSIS — I872 Venous insufficiency (chronic) (peripheral): Secondary | ICD-10-CM

## 2012-01-06 DIAGNOSIS — I6350 Cerebral infarction due to unspecified occlusion or stenosis of unspecified cerebral artery: Secondary | ICD-10-CM

## 2012-01-06 DIAGNOSIS — F0390 Unspecified dementia without behavioral disturbance: Secondary | ICD-10-CM

## 2012-01-06 DIAGNOSIS — D510 Vitamin B12 deficiency anemia due to intrinsic factor deficiency: Secondary | ICD-10-CM

## 2012-01-06 DIAGNOSIS — N259 Disorder resulting from impaired renal tubular function, unspecified: Secondary | ICD-10-CM

## 2012-01-06 LAB — URINALYSIS, ROUTINE W REFLEX MICROSCOPIC
Bilirubin Urine: NEGATIVE
Specific Gravity, Urine: 1.02 (ref 1.000–1.030)
Total Protein, Urine: NEGATIVE
Urine Glucose: NEGATIVE
pH: 6 (ref 5.0–8.0)

## 2012-01-06 MED ORDER — CYANOCOBALAMIN 1000 MCG/ML IJ SOLN
1000.0000 ug | Freq: Once | INTRAMUSCULAR | Status: AC
  Administered 2012-01-06: 1000 ug via INTRAMUSCULAR

## 2012-01-06 NOTE — Patient Instructions (Addendum)
Today we updated your med list in our EPIC system...    Continue your current medications the same...  Today we did a follow up Urinalysis & culture...  Please return to our lab on Friday 9/13 AM for your FASTING blood work...  We will call you w/ the results...  We gave you a B12 shot today...  Call for any problems...  Let's continue our 4 month follow up visits.Marland KitchenMarland Kitchen

## 2012-01-06 NOTE — Progress Notes (Signed)
Subjective:    Patient ID: Carrie Grimes, female    DOB: 13-May-1920, 78 y.o.   MRN: [id number]  HPI 78 y/o WF here for a follow up visit... she has mult med problems including cerebrovasc dis & stroke w/ prev right CAE in 1992;  chr ven insuffic w/ edema;  Hyperchol;  large HH;  mild renal insuffic &  hx UTIs;  DJD & Vit D defic;  senile dementia;  anemia...  ~  January 29, 2011:  19mo ROV & review of mult medical problems:    Cerebrovasc dis, Hx Stroke>  On ASA & Ticlid; she has been stable, in good spirits, w/o cerebral ischemic symptoms on these meds...    VI/ Edema>  On low sodium diet & Lasix40; wt is stable at 140# w/ VI changes and trace-1+ edema in LEs...    Chol>  On Lip10; FLP today showed TChol 151, TG 240, HDL 44, LDL 75; continue same med & better low fat diet!    HH>  On Antireflux regimen & Ranitadine; she has large HH seen on prev CT scan; denies CP, Abd pain, N/ V, dysphagia etc...    Renal Insuffic>  Baseline Creat= 1.4-1.5; labs today showed BUN=39, Creat=1.9; rec to decr Lasix40 to 1/2 tab daily & incr po fluids.    Vit D Defic>  On MVI + VitD 2000u daily; encouraged to take the vit D every day...    Senile dementia>  She is pleasantly demented & cared for beautifully by her family...    Hx Anemia>  On MVI + Niferex; labs showed Hg= 11.3, MCV=96; rec to continue her Fe, VitC & supplements...    Vit B12 Defic>  On B12 shots monthly...  ~  April 15, 2011:  77mo ROV & add-on for concern over "mini stroke"> family noted some change in behavior over the last 2wks- pt walking around furniture in an odd way, getting her meds confused (family should have been doing the meds for her all along), and saw her optometrist recently w/ cataracts found but no need for surg yet (?he saw hemorrhage in back of eye and questioned mini-strokes) so they requested this appt;  Recall hx right brain stroke 1992 w/ extensive area of encephalomalacia, & carotid art dis w/ right CAE 1992 by  [facility] & taking ASA/ Ticlid ever since (Note: they have refused to consider ch in meds, f/u Brain scans, or f/u CDopplers)...  Now they have now agreed to further testing & we will f/u LABS, MRI Brain, CDopplers ==> pending...  MRI Brain 04/19/11 showed large remote right hemispheric infarct w/ encephalomalacia, prominent sm vessel dis, global atrophy, no hemorrhage or mass, sinus mucosal thickening, CSpine spondylosis...  CDopplers 1/13 showed patent right CAE w/ mild soft plaque in bulb; mild mixed plaque on left bulb & bifurcation w/ 40-59% bilat ICA stenoses (no change)...  Note: at her last visit> baseline Creat noted to be 1.4-1.5; labs 10/12 showed BUN=39, Creat=1.9 so we decr Lasix40 to 1/2 tab daily & incr po fluids;  f/u blood work on the Lasix 20mg /d dose now shows BUN=37, Creat=1.6;  BP remains good at 128/72 today...  Hx Anemia w/ Fe defic & B12 defic on oral Iron & B12 shots monthly xyrs;  Labs today shows Hg=11.3 (stable) w/ MCV=96, Fe=56 (23%sat);  Rec to continue same Fe/ VitC, B12 shots...  ~  June 26, 2011:  28mo ROV & add-on at daugh request for behavioral changes assoc w/ her senile dementia> [name] saw  DrWong for Neuro 1/13 & he felt that her abrupt changes noted by family were related to her sudden visual change (?hemorrhage noted by Optometrist & at [facility]) and not evid for additional stroke;  CDopplers 1/13 showed mild mixed plaque w/ 40-59% (low end) bilat ICA stenoses, the prev right CAE & patch angioplasty were patent;  Since her last visit the family has noted some personality changes and agitation which is in stark contrast to her usual very sweet & cooperative demeanor;  She is apparently sl paranoid & daugh wants her checked for a chemical imbalance;  We reviewed avail options and decided to give her a trial of Depakote 125mg Bid> the family understands that they must take control of distrib all meds to the pt...  ~  Sep 04, 2011:  2-57mo ROV & [name] seems mostly back to  her sweet, demented, sl loud & HOH self> the Depakote seems to be helping & family is pleased as she is easier to manage now;  She had a fall 4/13 & saw TP w/o apparent injury other than soreness & some bruising but pt notes that she is getting over it;  She also had a hemorrhage behind her right eye & Optometrist sent her to [facility] & he advised against any surg according to the family, we do not have records but apparently there is no change in her vision...    ~  January 06, 2012:  60mo ROV & family notes sl more confused> she can remember from long ago but not recent events; she wears depends for Uincont & they note increased #of pads recently- we will check UA and C&S to be sure no UTI; Depakote has really helped her behavioral issues but they have a hard time taking the eve dose; she is drinking Ensure to help her nutrition; grandson getting married this weekend- reminded to get in that 2nd dose of Seroquel; OK B12 shot & Flu shot today...    Daughter shared an upcoming dilemma> she has renal dis from DM w/ Cr=6 & facing dialysis; mother really can't be left alone & they are trying to work out future plans.    We received report of eye care from [place] 6/13> bilat cats, & branch retinal vein occlusion right eye; they continue to monitor the situation...    Weight is up 4# to 141# today; BP=134/66 w/ 2+edema L>R on LASIX20 & low sodium> will recheck labs & see if Lasix can be incr(Cr in 2012 ranged 1.4=>1.9). We reviewed prob list, meds, xrays and labs> see below for updates >> LABS 9/13:  FLP- at goals on Lip10 x TG=172;  Chems- ok x BUN=39 Creat=1.8;  CBC- ok x Hg=11.1;  TSH=2.30;  UA looks clear but C&S pos for pan-sens EColi... NOTE> UTI Rx w/ Cipro;  We can't incr the Lasix, therefore rec better low sodium diet & support hose...   Problem List:       ALLERGIC RHINITIS (ICD-477.9) - she uses Claritin & Flonase as needed... she has 2 cats- Heidi & Aline Brochure!  HEARING LOSS  (ICD-389.9) - she doesn't have hearing aides- "it wouldn't work"...  DENTAL CARIES (ICD-521.00) - she has been repeatedly reminded to seek dental help, but refuses... family purees her foods...  CEREBROVASCULAR DISEASE (ICD-437.9) - s/p right CAE by DrLawson 1992> stable on ASA 81mg /d and TICLID 250mg Bid ever since then... she had repeatedly refused f/u CDopplers etc for years... she had been doing well and was asymptomatic- denying focal weakness, numbness, paresthesia,  pain, slurred speech, headache, dizziness, tremor, or seizure. ~  12/12: presented w/ some behavior change & family requested further eval:  MRI 12/12 showed large remote right hemispheric infarct w/ encephalomalacia, prominent sm vessel dis, global atrophy, no hemorrhage or mass, sinus mucosal thickening, CSpine spondylosis...   CDopplers 1/13 showed mild mixed plaque w/ 40-59% (low end) bilat ICA stenoses, the prev right CAE & patch angioplasty were patent... ~  1/13:  She saw DrWong for Neuro & his note is reviewed> he felt some of her recent motor problems were related to her visual changes & there was no evid for new stroke... Continue ASA/ Ticlid.  VENOUS INSUFFICIENCY, CHRONIC (ICD-459.81) - she has chronic LE edema- on LASIX 20mg /d now and low sodium diet... she had gained 23# to 152# in 2009 w/ Ensure & puree diet from famiy, now down to 140#... advises on low sodium, elevate legs, support hose... ~  6/11:  BUN=40, Creat=1.6 on the Lasix80 & asked to decr to 40mg /d and restrict sodium. ~  12/11:  labs showed BUN=41, Creat=1.5 on Lasix40... Baseline Creat ~1.4-1.5 ~  10/12:  Labs showed BUN=39, Creat=1.9.Marland KitchenMarland Kitchen rec to decr lasix further to 20mg  daily... ~  Labs 12/12 showed improved Creat=1.6 & continue Lasix20... ~  Labs 9/13 showed BUN=39, Creat=1.8 on her Lasix20/d...  HYPERCHOLESTEROLEMIA (ICD-272.0) - on diet + LIPITOR 10mg /d... her TG's have been elevated but she refuses more meds. ~  labs 3/08 showed TChol 183, TG 205, HDL  54, LDL 98 ~  labs 6/09 showed TChol 190, TG 314, HDL 43, LDL 94... she refuses more meds, rec low fat diet. ~  labs 6/10 showed TChol 184, TG 279, HDL 50, LDL 97 ~  labs 6/11 showed TChol 180, TG 277, HDL 49, LDL 95 ~  FLP 12/11 on Lip10 showed TChol 168. TH 184, HDL50, LDL 81 ~  FLP 10/12 on Lip10 showed TChol 151, TG 240, HDL 44, LDL 75 & rec for better low fat diet. ~  FLP 9/13 on Lip10 showed TChol 147, TG 172, HDL 42, LDL 71   HIATAL HERNIA (ICD-553.3) - large HH seen on prev CT Abd in 2001... takes ZANTAC 75mg  Qhs...  RENAL INSUFFICIENCY (ICD-588.9) - Creat= 1.5 to 1.7 in 2008... ~  labs 6/09 showed BUN= 33, Creat= 1.4 ~  labs 6/10 showed BUN= 36, Creat= 1.5 ~  labs 12/10 showed BUN= 33, Creat= 1.4 ~  labs 6/11 showed BUN=40, Creat=1.6.Marland KitchenMarland Kitchen rec to decr Lasix from 80 to 40mg /d. ~  labs 12/11 showed BUN=41, creat=1.5 ~  Labs 3/12 Hosp showed Creat 2.01 ==> 1.38 ~  Labs 10/12 showed BUN=39, Creat=1.9 & we rec that she decr Lasix to 20mg /d. ~  Labs 12/12 on Lasix20 showed BUN=37, Creat=1.6.Marland KitchenMarland Kitchen BP is wnl, continue same meds. ~  Labs 9/13 on Lasix20 showed BUN=39, Creat=1.8  UTI'S, HX OF (ICD-V13.00)  VITAMIN D DEFICIENCY (ICD-268.9) - she is likely osteoporotic but unmeasured due to refusal of BMD & Bisphos therapy options... ~  labs 6/09 showed Vit D level = 26... rec to take OTC Vit D 1000 u daily... ~  labs 6/10 showed Vit D level = 24... rec to incr Vit D to 2000 u daily.  Hx of STROKE (ICD-434.91) - hx right brain stroke in 1992 w/ CTBrain showing right post temporal & parietal low density area w/ encephalomalacia... ~  She remains on ASA 81mg /d & TICLID 250mg Bid w/o acute cerebral ischemic symptoms... ~  12/12: she presented w/ some behavioral changes but no focal neuro  deficits; they requested further eval after 25yrs of refusing f/u scans, dopplers, etc... ~  MRI Brain 12/12 showed large remote right hemispheric infarct w/ encephalomalacia, prominent sm vessel dis, global  atrophy, no hemorrhage or mass, sinus mucosal thickening, CSpine spondylosis... ~  CDopplers 1/13 showed mild mixed plaque w/ 40-59% (low end) bilat ICA stenoses, the prev right CAE & patch angioplasty were patent... ~  1/13: she saw DrWong for Neuro & his note is reviewed> he felt some of her recent motor problems were related to her visual changes & there was no evid for new stroke...  SENILE DEMENTIA (ICD-290.0) ~  2/13:  Due to behavioral changes and the family's growing frustration we decided to try Depakote 125mg  Bid... ~  5/13:  They report improvement in behavior back toward baseline, hopefully this will continue, same Rx...  ANEMIA (ICD-285.9) & PERNICIOUS ANEMIA (ICD-281.0) - on monthly B12 shots for years... in 2006 she was also iron deficient and received IV iron therapy (she refused GI eval)... on FeSO4 daily... she feels well and denies weakness, fatigue, bleeding, dark stools, etc... ~  labs 3/08 showed Hg= 13.2 ~  labs 6/09 showed Hg= 12.7 ~  labs 6/10 showed Hg= 11.7... Fe, B12, Folate were all WNL. ~  labs 12/10 showed Hg= 12.4 ~  labs 12/11 showed Hg= 12.2, Fe=106 (34%sat)... ~  Labs 3/12 Hosp showed Hg= 12.2 ==> 10.4 ~  Labs 10/12 showed Hg= 11.3, MCV= 96 ~  Labs 12/12 showed Hg=11.3 w/ MCV=96, Fe=56 (23%sat); Rec to continue same Fe/ VitC, B12 shots... ~  Labs 9/13 showed Hg= 11.1  NOTE>> She has repeatedly refused GYN exams, PAP smears, Mammograms, & Colonoscopy...   Past Surgical History  Procedure Date  . Cholecystectomy   . Carotid endarterectomy     Outpatient Encounter Prescriptions as of 01/06/2012  Medication Sig Dispense Refill  . acetaminophen (TYLENOL) 500 MG tablet Take 2 tablet by mouth at bedtime      . aspirin 81 MG tablet Take 81 mg by mouth daily.        Marland Kitchen atorvastatin (LIPITOR) 10 MG tablet TAKE 1 TABLET EVERY DAY  30 tablet  5  . cetirizine (ZYRTEC) 10 MG tablet Take 10 mg by mouth daily.        . Cholecalciferol (EQL VITAMIN D3) 1000 UNITS  tablet Take 1,000 Units by mouth daily.        . [facility] RANITIDINE 75 MG tablet TAKE ONE TABLET BY MOUTH AT BEDTIME  30 tablet  9  . Dextromethorphan Polistirex (DELSYM PO) Take by mouth 2 (two) times daily as needed. Take as directed.      . divalproex (DEPAKOTE) 125 MG DR tablet Take 125 mg by mouth 2 (two) times daily.       . furosemide (LASIX) 20 MG tablet Take 1 tablet (20 mg total) by mouth daily.  30 tablet  11  . iron polysaccharides (NIFEREX) 150 MG capsule Take 150 mg by mouth daily.        . Multiple Vitamin (MULTIVITAMIN) capsule Take 1 capsule by mouth daily.        . sodium chloride (OCEAN) 0.65 % nasal spray Place 1 spray into the nose at bedtime.      . ticlopidine (TICLID) 250 MG tablet TAKE ONE TABLET BY MOUTH TWICE DAILY  60 tablet  11  . vitamin C (ASCORBIC ACID) 500 MG tablet Take 500 mg by mouth daily. To take along with the iron tablet daily  Allergies  Allergen Reactions  . Procaine Hcl     REACTION: pt states reaction to shot at dentist office w/ tachycardia \\T \ SOB (? if really from combination w/epi)    Current Medications, Allergies, Past Medical History, Past Surgical History, Family History, and Social History were reviewed in [employer] record.      Review of Systems       See HPI - all other systems neg except as noted...      The patient complains of decreased hearing, dyspnea on exertion, and difficulty walking.  The patient denies anorexia, fever, weight loss, weight gain, vision loss, hoarseness, chest pain, syncope, peripheral edema, prolonged cough, headaches, hemoptysis, abdominal pain, melena, hematochezia, severe indigestion/heartburn, hematuria, incontinence, muscle weakness, suspicious skin lesions, transient blindness, depression, unusual weight change, abnormal bleeding, enlarged lymph nodes, and angioedema.     Objective:   Physical Exam     WD WN 78 y/o WF in NAD...  GENERAL:  Alert & oriented x 2;  pleasant &  cooperative... HEENT:  [place]/AT, EOM-wnl, PERRLA, Fundi-benign, EACs-clear, TMs-wnl, NOSE-clear, THROAT-clear but dentition is very poor. NECK:  Supple w/ adeq ROM; no JVD; carotid impulses OK w/o bruits, +CAE scar on Rt; no thyromegaly or nodules palpated; no lymphadenopathy. CHEST:  Clear to P & A; without wheezes/ rales/ or rhonchi. HEART:  Regular Rhythm; without murmurs/ rubs/ or gallops. ABDOMEN:  Soft & nontender; normal bowel sounds; no organomegaly or masses detected. EXT: without deformities or arthritic changes; she has VI changes w/ 2+edema on Lft & 1+edema on Rt NEURO:  CN's intact; ?no focal neuro deficits (DrWong detected a field cut & some left sided weakness). DERM:  No lesions noted; no rash- just the bruises & abrasion from fall.  RADIOLOGY DATA:  Reviewed in the EPIC EMR & discussed w/ the patient...  LABORATORY DATA:  Reviewed in the EPIC EMR & discussed w/ the patient...   Assessment & Plan:    Cerebrovasc Dis>  On the ASA/ Ticlid & she does not want to change this med, therefore continue same; I gave them the option of switching to ASA/ Plavix.  VI> Edema>  She is reminded to take the Lasix20 every day, avoid salt, elev legs, wear support hose; and incr free water intake...  CHOL>  On Lip10 & f/u FLP showed Chol numbers ok, but elev TG=172 & needs better low fat diet...  HH/ GERD>  She takes Zantac 150mg /d & doing quite well, no symptoms she says...  Renal Insuffic>  Creat is elev at 1.8 on Lasix20 but this is improved from prev 1.9 on Lasix40; continue same, low sodium, incr fluid intake...  Hx Stroke & Dementia>  MRI 12/12 shows extensive chronic changes but NAD; they want to continue ASA/ Ticlid; behavioral changes noted are likely a manifestation of her senile dementia> offered Aricept, Ativan, etc but they are ambivalent & will decide & let me know; we finally decided to try Depakote 125mg  Bid to see if it helps to calm her down... She is now ambulating w/ walker  & family helps...  Anemia & B12 Defic>  On monthly B12 shots & Fe daily;  Continue same...   Patient's Medications  New Prescriptions   No medications on file  Previous Medications   ACETAMINOPHEN (TYLENOL) 500 MG TABLET    Take 2 tablet by mouth at bedtime   ASPIRIN 81 MG TABLET    Take 81 mg by mouth daily.     ATORVASTATIN (LIPITOR) 10 MG  TABLET    TAKE 1 TABLET EVERY DAY   CETIRIZINE (ZYRTEC) 10 MG TABLET    Take 10 mg by mouth daily.     CHOLECALCIFEROL (EQL VITAMIN D3) 1000 UNITS TABLET    Take 1,000 Units by mouth daily.     [facility]S RANITIDINE 75 MG TABLET    TAKE ONE TABLET BY MOUTH AT BEDTIME   DEXTROMETHORPHAN POLISTIREX (DELSYM PO)    Take by mouth 2 (two) times daily as needed. Take as directed.   DIVALPROEX (DEPAKOTE) 125 MG DR TABLET    Take 125 mg by mouth 2 (two) times daily.    FUROSEMIDE (LASIX) 20 MG TABLET    Take 1 tablet (20 mg total) by mouth daily.   IRON POLYSACCHARIDES (NIFEREX) 150 MG CAPSULE    Take 150 mg by mouth daily.     MULTIPLE VITAMIN (MULTIVITAMIN) CAPSULE    Take 1 capsule by mouth daily.     SODIUM CHLORIDE (OCEAN) 0.65 % NASAL SPRAY    Place 1 spray into the nose at bedtime.   TICLOPIDINE (TICLID) 250 MG TABLET    TAKE ONE TABLET BY MOUTH TWICE DAILY   VITAMIN C (ASCORBIC ACID) 500 MG TABLET    Take 500 mg by mouth daily. To take along with the iron tablet daily   Modified Medications   No medications on file  Discontinued Medications   No medications on file

## 2012-01-08 LAB — URINE CULTURE

## 2012-01-09 ENCOUNTER — Other Ambulatory Visit: Payer: Self-pay | Admitting: Pulmonary Disease

## 2012-01-09 MED ORDER — CIPROFLOXACIN HCL 250 MG PO TABS: 250.0000 mg | ORAL_TABLET | Freq: Two times a day (BID) | ORAL | Status: AC

## 2012-01-10 ENCOUNTER — Other Ambulatory Visit (INDEPENDENT_AMBULATORY_CARE_PROVIDER_SITE_OTHER): Payer: Medicare Other

## 2012-01-10 DIAGNOSIS — E7800 Pure hypercholesterolemia, unspecified: Secondary | ICD-10-CM

## 2012-01-10 DIAGNOSIS — F411 Generalized anxiety disorder: Secondary | ICD-10-CM

## 2012-01-10 DIAGNOSIS — F419 Anxiety disorder, unspecified: Secondary | ICD-10-CM

## 2012-01-10 DIAGNOSIS — R0609 Other forms of dyspnea: Secondary | ICD-10-CM

## 2012-01-10 DIAGNOSIS — I679 Cerebrovascular disease, unspecified: Secondary | ICD-10-CM

## 2012-01-10 DIAGNOSIS — I872 Venous insufficiency (chronic) (peripheral): Secondary | ICD-10-CM

## 2012-01-10 DIAGNOSIS — D510 Vitamin B12 deficiency anemia due to intrinsic factor deficiency: Secondary | ICD-10-CM

## 2012-01-10 DIAGNOSIS — R0989 Other specified symptoms and signs involving the circulatory and respiratory systems: Secondary | ICD-10-CM

## 2012-01-10 LAB — HEPATIC FUNCTION PANEL
ALT: 18 U/L (ref 0–35)
Albumin: 3.8 g/dL (ref 3.5–5.2)
Bilirubin, Direct: 0.1 mg/dL (ref 0.0–0.3)
Total Protein: 6.8 g/dL (ref 6.0–8.3)

## 2012-01-10 LAB — BASIC METABOLIC PANEL
BUN: 39 mg/dL — ABNORMAL HIGH (ref 6–23)
CO2: 23 mEq/L (ref 19–32)
Chloride: 111 mEq/L (ref 96–112)
Creatinine, Ser: 1.8 mg/dL — ABNORMAL HIGH (ref 0.4–1.2)
Glucose, Bld: 94 mg/dL (ref 70–99)

## 2012-01-10 LAB — CBC WITH DIFFERENTIAL/PLATELET
Basophils Absolute: 0 10*3/uL (ref 0.0–0.1)
Basophils Relative: 0.6 % (ref 0.0–3.0)
Eosinophils Absolute: 0.1 10*3/uL (ref 0.0–0.7)
Lymphocytes Relative: 19.8 % (ref 12.0–46.0)
MCHC: 33.7 g/dL (ref 30.0–36.0)
Neutrophils Relative %: 71.6 % (ref 43.0–77.0)
RBC: 3.41 Mil/uL — ABNORMAL LOW (ref 3.87–5.11)

## 2012-01-10 LAB — BRAIN NATRIURETIC PEPTIDE: Pro B Natriuretic peptide (BNP): 146 pg/mL — ABNORMAL HIGH (ref 0.0–100.0)

## 2012-01-10 LAB — LIPID PANEL
Cholesterol: 147 mg/dL (ref 0–200)
Total CHOL/HDL Ratio: 4
Triglycerides: 172 mg/dL — ABNORMAL HIGH (ref 0.0–149.0)

## 2012-02-04 ENCOUNTER — Ambulatory Visit (INDEPENDENT_AMBULATORY_CARE_PROVIDER_SITE_OTHER): Payer: Medicare Other

## 2012-02-04 DIAGNOSIS — D649 Anemia, unspecified: Secondary | ICD-10-CM

## 2012-02-05 MED ORDER — CYANOCOBALAMIN 1000 MCG/ML IJ SOLN
1000.0000 ug | Freq: Once | INTRAMUSCULAR | Status: AC
  Administered 2012-02-05: 1000 ug via INTRAMUSCULAR

## 2012-03-03 ENCOUNTER — Ambulatory Visit (INDEPENDENT_AMBULATORY_CARE_PROVIDER_SITE_OTHER): Payer: Medicare Other

## 2012-03-03 DIAGNOSIS — D649 Anemia, unspecified: Secondary | ICD-10-CM

## 2012-03-04 MED ORDER — CYANOCOBALAMIN 1000 MCG/ML IJ SOLN
1000.0000 ug | Freq: Once | INTRAMUSCULAR | Status: AC
  Administered 2012-03-04: 1000 ug via INTRAMUSCULAR

## 2012-04-06 ENCOUNTER — Telehealth: Payer: Self-pay | Admitting: Pulmonary Disease

## 2012-04-06 NOTE — Telephone Encounter (Signed)
Called and spoke with [name] (patients daughter). She states that they received a letter from patients insurance company that come first of the year they would no longer cover ticlopidine 250mg  which patient is currently taking.  [name] state they did however give her alternatives to choose from:  For $8:  Anagrelide-hydrochloride, Cilostazol, or Clopidogrel  For $45: Brilinta or Essient  Dr. Lenna Gilford, please advise as to which, if either you would like to switch patient to, thank You

## 2012-04-07 ENCOUNTER — Ambulatory Visit (INDEPENDENT_AMBULATORY_CARE_PROVIDER_SITE_OTHER): Payer: Medicare Other

## 2012-04-07 DIAGNOSIS — D649 Anemia, unspecified: Secondary | ICD-10-CM

## 2012-04-07 MED ORDER — CLOPIDOGREL BISULFATE 75 MG PO TABS: 75.0000 mg | ORAL_TABLET | Freq: Every day | ORAL | Status: DC

## 2012-04-07 NOTE — Telephone Encounter (Signed)
Per SN---ok to change to generic plavix 75 mg   1 daily.  thanks

## 2012-04-07 NOTE — Telephone Encounter (Signed)
Spoke with Daughter,made aware of change to plavix. Called rx in to pharmacy with a hold till [place].  Nothing further needed.

## 2012-04-09 DIAGNOSIS — D649 Anemia, unspecified: Secondary | ICD-10-CM

## 2012-04-09 MED ORDER — CYANOCOBALAMIN 1000 MCG/ML IJ SOLN
1000.0000 ug | Freq: Once | INTRAMUSCULAR | Status: AC
  Administered 2012-04-09: 1000 ug via INTRAMUSCULAR

## 2012-04-26 ENCOUNTER — Other Ambulatory Visit: Payer: Self-pay | Admitting: Pulmonary Disease

## 2012-05-05 ENCOUNTER — Encounter: Payer: Self-pay | Admitting: Pulmonary Disease

## 2012-05-05 ENCOUNTER — Ambulatory Visit (INDEPENDENT_AMBULATORY_CARE_PROVIDER_SITE_OTHER): Payer: Medicare Other | Admitting: Pulmonary Disease

## 2012-05-05 ENCOUNTER — Ambulatory Visit: Payer: Medicare Other

## 2012-05-05 VITALS — BP 160/70 | HR 72 | Temp 97.6°F | Ht 64.0 in | Wt 142.2 lb

## 2012-05-05 DIAGNOSIS — R609 Edema, unspecified: Secondary | ICD-10-CM

## 2012-05-05 DIAGNOSIS — M1990 Unspecified osteoarthritis, unspecified site: Secondary | ICD-10-CM

## 2012-05-05 DIAGNOSIS — N259 Disorder resulting from impaired renal tubular function, unspecified: Secondary | ICD-10-CM

## 2012-05-05 DIAGNOSIS — I679 Cerebrovascular disease, unspecified: Secondary | ICD-10-CM

## 2012-05-05 DIAGNOSIS — I6350 Cerebral infarction due to unspecified occlusion or stenosis of unspecified cerebral artery: Secondary | ICD-10-CM

## 2012-05-05 DIAGNOSIS — F0390 Unspecified dementia without behavioral disturbance: Secondary | ICD-10-CM

## 2012-05-05 DIAGNOSIS — E7800 Pure hypercholesterolemia, unspecified: Secondary | ICD-10-CM

## 2012-05-05 DIAGNOSIS — E559 Vitamin D deficiency, unspecified: Secondary | ICD-10-CM

## 2012-05-05 DIAGNOSIS — I872 Venous insufficiency (chronic) (peripheral): Secondary | ICD-10-CM

## 2012-05-05 DIAGNOSIS — K449 Diaphragmatic hernia without obstruction or gangrene: Secondary | ICD-10-CM

## 2012-05-05 DIAGNOSIS — D510 Vitamin B12 deficiency anemia due to intrinsic factor deficiency: Secondary | ICD-10-CM

## 2012-05-05 MED ORDER — CYANOCOBALAMIN 1000 MCG/ML IJ SOLN
1000.0000 ug | Freq: Once | INTRAMUSCULAR | Status: AC
  Administered 2012-05-05: 1000 ug via INTRAMUSCULAR

## 2012-05-05 MED ORDER — CYANOCOBALAMIN 1000 MCG/ML IJ SOLN: 1000.0000 ug | Freq: Once | INTRAMUSCULAR | Status: DC

## 2012-05-05 NOTE — Patient Instructions (Addendum)
Today we updated your med list in our EPIC system...    Continue your current medications the same...  We discussed trying the kagel exercises for your bladder control...    Let me know if the use of pads increases 7 we will try an overactive bladder med...  Today we gave you the B12 shot...  Call for any questions...  Let's plan a follow up visit w/ blood work in 4 months.Marland KitchenMarland Kitchen

## 2012-05-05 NOTE — Progress Notes (Signed)
Subjective:    Patient ID: Carrie Grimes, female    DOB: [birth year], 79 y.o.   MRN: [id number]  HPI 79 y/o WF here for a follow up visit... she has mult med problems including cerebrovasc dis & stroke w/ prev right CAE in 1992;  chr ven insuffic w/ edema;  Hyperchol;  large HH;  mild renal insuffic &  hx UTIs;  DJD & Vit D defic;  senile dementia;  anemia...  ~  April 15, 2011:  12mo ROV & add-on for concern over "mini stroke"> family noted some change in behavior over the last 2wks- pt walking around furniture in an odd way, getting her meds confused (family should have been doing the meds for her all along), and saw her optometrist recently w/ cataracts found but no need for surg yet (?he saw hemorrhage in back of eye and questioned mini-strokes) so they requested this appt;  Recall hx right brain stroke 1992 w/ extensive area of encephalomalacia, & carotid art dis w/ right CAE 1992 by [facility] & taking ASA/ Ticlid ever since (Note: they have refused to consider ch in meds, f/u Brain scans, or f/u CDopplers)...  Now they have now agreed to further testing & we will f/u LABS, MRI Brain, CDopplers ==> pending...  MRI Brain 04/19/11 showed large remote right hemispheric infarct w/ encephalomalacia, prominent sm vessel dis, global atrophy, no hemorrhage or mass, sinus mucosal thickening, CSpine spondylosis...  CDopplers 1/13 showed patent right CAE w/ mild soft plaque in bulb; mild mixed plaque on left bulb & bifurcation w/ 40-59% bilat ICA stenoses (no change)...  Note: at her last visit> baseline Creat noted to be 1.4-1.5; labs 10/12 showed BUN=39, Creat=1.9 so we decr Lasix40 to 1/2 tab daily & incr po fluids;  f/u blood work on the Lasix 20mg /d dose now shows BUN=37, Creat=1.6;  BP remains good at 128/72 today...  Hx Anemia w/ Fe defic & B12 defic on oral Iron & B12 shots monthly xyrs;  Labs today shows Hg=11.3 (stable) w/ MCV=96, Fe=56 (23%sat);  Rec to continue same Fe/ VitC, B12  shots...  ~  June 26, 2011:  29mo ROV & add-on at daugh request for behavioral changes assoc w/ her senile dementia> Carrie Grimes saw Carrie Grimes for Neuro 1/13 & he felt that her abrupt changes noted by family were related to her sudden visual change (?hemorrhage noted by Optometrist & at [facility]) and not evid for additional stroke;  CDopplers 1/13 showed mild mixed plaque w/ 40-59% (low end) bilat ICA stenoses, the prev right CAE & patch angioplasty were patent;  Since her last visit the family has noted some personality changes and agitation which is in stark contrast to her usual very sweet & cooperative demeanor;  She is apparently sl paranoid & daugh wants her checked for a chemical imbalance;  We reviewed avail options and decided to give her a trial of Depakote 125mg Bid> the family understands that they must take control of distrib all meds to the pt...  ~  Sep 04, 2011:  2-42mo ROV & Carrie Grimes seems mostly back to her sweet, demented, sl loud & HOH self> the Depakote seems to be helping & family is pleased as she is easier to manage now;  She had a fall 4/13 & saw TP w/o apparent injury other than soreness & some bruising but pt notes that she is getting over it;  She also had a hemorrhage behind her right eye & Optometrist sent her to Carrie Grimes & he advised against any  surg according to the family, we do not have records but apparently there is no change in her vision...    ~  January 06, 2012:  68mo ROV & family notes sl more confused> she can remember from long ago but not recent events; she wears depends for Uincont & they note increased #of pads recently- we will check UA and C&S to be sure no UTI; Depakote has really helped her behavioral issues but they have a hard time taking the eve dose; she is drinking Ensure to help her nutrition; grandson getting married this weekend- reminded to get in that 2nd dose of Seroquel; OK B12 shot & Flu shot today...    Daughter shared an upcoming dilemma> she has renal dis  from DM w/ Cr=6 & facing dialysis; mother really can't be left alone & they are trying to work out future plans.    We received report of eye care from Carrie Grimes 6/13> bilat cats, & branch retinal vein occlusion right eye; they continue to monitor the situation...    Weight is up 4# to 141# today; BP=134/66 w/ 2+edema L>R on LASIX20 & low sodium> will recheck labs & see if Lasix can be incr(Cr in 2012 ranged 1.4=>1.9). We reviewed prob list, meds, xrays and labs> see below for updates >> LABS 9/13:  FLP- at goals on Lip10 x TG=172;  Chems- ok x BUN=39 Creat=1.8;  CBC- ok x Hg=11.1;  TSH=2.30;  UA looks clear but C&S pos for pan-sens EColi... NOTE> UTI Rx w/ Cipro;  We can't incr the Lasix, therefore rec better low sodium diet & support hose...  ~  May 05, 2012:  42mo ROV & family notes incr incont & use of depends- we discussed Kaegel exercises for better continence & offered trial Mybetriq but they don't want new meds;  They report Dx w/ Glaucoma from eye doc on gtts now...     AR, HOH, Dental caries> we reviewed OTC meds & advised hearing eval, hearing aides, & dental work...    Cerebrovasc dis, Hx Stroke>  On ASA & Ticlid; she has been stable w/o cerebral ischemic symptoms on these meds; but they called 12/13 stating insurance won't pay for Ticlid & Plavix is just $8 on their plan therefore switched to ASA/ Plavix...    VI/ Edema>  On low sodium diet & Lasix20; wt is stable at 142# w/ VI changes and trace-1+ edema in LEs...    Chol>  On Lip10; last FLP 9/13 showed TChol 147, TG 172, HDL 42, LDL 71; continue same med & better low fat diet!    HH>  On Antireflux regimen & Ranitadine; she has large HH seen on prev CT scan; denies CP, Abd pain, N/ V, dysphagia etc...    Renal Insuffic>  Baseline Creat= 1.4-1.5; labs 2012 showed Creat up to1.9 & we decr Lasix40 to 1/2 tab daily & incr po fluids.    Vit D Defic>  On MVI + VitD 2000u daily; encouraged to take the vit D every day...     Senile dementia>  She is pleasantly demented & cared for beautifully by her family...    Hx Anemia>  On MVI + Niferex; labs 9/13 showed Hg= 11.1, MCV=96; rec to continue her Fe, VitC & supplements...    Vit B12 Defic>  On B12 shots monthly... We reviewed prob list, meds, xrays and labs> see below for updates >> she had the 2013 flu vaccine 9/13... Given Pneumonia & TDAP updates in 2010.  Problem List:       ALLERGIC RHINITIS (ICD-477.9) - she uses Claritin & Flonase as needed... she has 2 cats- Heidi & Aline Brochure!  HEARING LOSS (ICD-389.9) - she doesn't have hearing aides- "it wouldn't work"...  DENTAL CARIES (ICD-521.00) - she has been repeatedly reminded to seek dental help, but refuses... family purees her foods...  CEREBROVASCULAR DISEASE (ICD-437.9) - s/p right CAE by [facility] 1992> stable on ASA 81mg /d and TICLID 250mg Bid ever since then,but insurance wouldn't cover Ticlid starting in 2014 therefore switched to ASA81mg /d & PLAVIX 75mg /d... she had been doing well and was asymptomatic- denying focal weakness, numbness, paresthesia, pain, slurred speech, headache, dizziness, tremor, or seizure. ~  12/12: presented w/ some behavior change & family requested further eval:  MRI 12/12 showed large remote right hemispheric infarct w/ encephalomalacia, prominent sm vessel dis, global atrophy, no hemorrhage or mass, sinus mucosal thickening, CSpine spondylosis...  CDopplers 1/13 showed mild mixed plaque w/ 40-59% (low end) bilat ICA stenoses, the prev right CAE & patch angioplasty were patent... ~  1/13:  She saw Carrie Grimes for Neuro & his note is reviewed> he felt some of her recent motor problems were related to her visual changes & there was no evid for new stroke... Continue ASA/ Ticlid. ~  1/14:  She was switched to ASA 81,g/d & PLAVIX 75mg /d due to insurance coverage...  VENOUS INSUFFICIENCY, CHRONIC (ICD-459.81) - she has chronic LE edema- on LASIX 20mg /d now and low sodium diet... she had  gained 23# to 152# in 2009 w/ Ensure & puree diet from famiy, now down to 140#... advises on low sodium, elevate legs, support hose... ~  6/11:  BUN=40, Creat=1.6 on the Lasix80 & asked to decr to 40mg /d and restrict sodium. ~  12/11:  labs showed BUN=41, Creat=1.5 on Lasix40... Baseline Creat ~1.4-1.5 ~  10/12:  Labs showed BUN=39, Creat=1.9.Marland KitchenMarland Kitchen rec to decr lasix further to 20mg  daily... ~  Labs 12/12 showed improved Creat=1.6 & continue Lasix20... ~  Labs 9/13 showed BUN=39, Creat=1.8 on her Lasix20/d...  HYPERCHOLESTEROLEMIA (ICD-272.0) - on diet + LIPITOR 10mg /d... her TG's have been elevated but she refuses more meds. ~  labs 3/08 showed TChol 183, TG 205, HDL 54, LDL 98 ~  labs 6/09 showed TChol 190, TG 314, HDL 43, LDL 94... she refuses more meds, rec low fat diet. ~  labs 6/10 showed TChol 184, TG 279, HDL 50, LDL 97 ~  labs 6/11 showed TChol 180, TG 277, HDL 49, LDL 95 ~  FLP 12/11 on Lip10 showed TChol 168. TH 184, HDL50, LDL 81 ~  FLP 10/12 on Lip10 showed TChol 151, TG 240, HDL 44, LDL 75 & rec for better low fat diet. ~  FLP 9/13 on Lip10 showed TChol 147, TG 172, HDL 42, LDL 71   HIATAL HERNIA (ICD-553.3) - large HH seen on prev CT Abd in 2001... takes ZANTAC 75mg  Qhs...  RENAL INSUFFICIENCY (ICD-588.9) - Creat= 1.5 to 1.7 in 2008... ~  labs 6/09 showed BUN= 33, Creat= 1.4 ~  labs 6/10 showed BUN= 36, Creat= 1.5 ~  labs 12/10 showed BUN= 33, Creat= 1.4 ~  labs 6/11 showed BUN=40, Creat=1.6.Marland KitchenMarland Kitchen rec to decr Lasix from 80 to 40mg /d. ~  labs 12/11 showed BUN=41, creat=1.5 ~  Labs 3/12 Hosp showed Creat 2.01 ==> 1.38 ~  Labs 10/12 showed BUN=39, Creat=1.9 & we rec that she decr Lasix to 20mg /d. ~  Labs 12/12 on Lasix20 showed BUN=37, Creat=1.6.Marland KitchenMarland Kitchen BP is wnl, continue same meds. ~  Labs  9/13 on Lasix20 showed BUN=39, Creat=1.8.Marland KitchenMarland Kitchen Continue same...  UTI'S, HX OF (ICD-V13.00)  VITAMIN D DEFICIENCY (ICD-268.9) - she is likely osteoporotic but unmeasured due to refusal of BMD & Bisphos  therapy options... ~  labs 6/09 showed Vit D level = 26... rec to take OTC Vit D 1000 u daily... ~  labs 6/10 showed Vit D level = 24... rec to incr Vit D to 2000 u daily.  Hx of STROKE (ICD-434.91) - hx right brain stroke in 1992 w/ CTBrain showing right post temporal & parietal low density area w/ encephalomalacia... ~  She remains on ASA 81mg /d & TICLID 250mg Bid w/o acute cerebral ischemic symptoms... ~  12/12: she presented w/ some behavioral changes but no focal neuro deficits; they requested further eval after 86yrs of refusing f/u scans, dopplers, etc... ~  MRI Brain 12/12 showed large remote right hemispheric infarct w/ encephalomalacia, prominent sm vessel dis, global atrophy, no hemorrhage or mass, sinus mucosal thickening, CSpine spondylosis... ~  CDopplers 1/13 showed mild mixed plaque w/ 40-59% (low end) bilat ICA stenoses, the prev right CAE & patch angioplasty were patent... ~  1/13: she saw Carrie Grimes for Neuro & his note is reviewed> he felt some of her recent motor problems were related to her visual changes & there was no evid for new stroke...  SENILE DEMENTIA (ICD-290.0) ~  2/13:  Due to behavioral changes and the family's growing frustration we decided to try Depakote 125mg  Bid... ~  5/13:  They report improvement in behavior back toward baseline, hopefully this will continue, same Rx...  ANEMIA (ICD-285.9) & PERNICIOUS ANEMIA (ICD-281.0) - on monthly B12 shots for years... in 2006 she was also iron deficient and received IV iron therapy (she refused GI eval)... on FeSO4 daily... she feels well and denies weakness, fatigue, bleeding, dark stools, etc... ~  labs 3/08 showed Hg= 13.2 ~  labs 6/09 showed Hg= 12.7 ~  labs 6/10 showed Hg= 11.7... Fe, B12, Folate were all WNL. ~  labs 12/10 showed Hg= 12.4 ~  labs 12/11 showed Hg= 12.2, Fe=106 (34%sat)... ~  Labs 3/12 Hosp showed Hg= 12.2 ==> 10.4 ~  Labs 10/12 showed Hg= 11.3, MCV= 96 ~  Labs 12/12 showed Hg=11.3 w/ MCV=96, Fe=56  (23%sat); Rec to continue same Fe/ VitC, B12 shots... ~  Labs 9/13 showed Hg= 11.1  NOTE>> She has repeatedly refused GYN exams, PAP smears, Mammograms, & Colonoscopy...   Past Surgical History  Procedure Date  . Cholecystectomy   . Carotid endarterectomy     Outpatient Encounter Prescriptions as of 05/05/2012  Medication Sig Dispense Refill  . acetaminophen (TYLENOL) 500 MG tablet Take 2 tablet by mouth at bedtime      . aspirin 81 MG tablet Take 81 mg by mouth daily.        Marland Kitchen atorvastatin (LIPITOR) 10 MG tablet TAKE 1 TABLET EVERY DAY  30 tablet  5  . cetirizine (ZYRTEC) 10 MG tablet Take 10 mg by mouth daily.        . Cholecalciferol (EQL VITAMIN D3) 1000 UNITS tablet Take 1,000 Units by mouth daily.        . clopidogrel (PLAVIX) 75 MG tablet Take 1 tablet (75 mg total) by mouth daily.  30 tablet  6  . [facility] RANITIDINE 75 MG tablet TAKE ONE TABLET BY MOUTH AT BEDTIME  30 tablet  9  . Dextromethorphan Polistirex (DELSYM PO) Take by mouth 2 (two) times daily as needed. Take as directed.      Marland Kitchen  divalproex (DEPAKOTE) 125 MG DR tablet Take 125 mg by mouth 2 (two) times daily.       . furosemide (LASIX) 20 MG tablet Take 1 tablet (20 mg total) by mouth daily.  30 tablet  11  . iron polysaccharides (NIFEREX) 150 MG capsule Take 150 mg by mouth daily.        . Multiple Vitamin (MULTIVITAMIN) capsule Take 1 capsule by mouth daily.        . sodium chloride (OCEAN) 0.65 % nasal spray Place 1 spray into the nose at bedtime.      . vitamin C (ASCORBIC ACID) 500 MG tablet Take 500 mg by mouth daily. To take along with the iron tablet daily       . [DISCONTINUED] ticlopidine (TICLID) 250 MG tablet TAKE ONE TABLET BY MOUTH TWICE DAILY  60 tablet  11    Allergies  Allergen Reactions  . Procaine Hcl     REACTION: pt states reaction to shot at dentist office w/ tachycardia \\T \ SOB (? if really from combination w/epi)    Current Medications, Allergies, Past Medical History, Past Surgical History,  Family History, and Social History were reviewed in [employer] record.      Review of Systems       See HPI - all other systems neg except as noted...      The patient complains of decreased hearing, dyspnea on exertion, and difficulty walking.  The patient denies anorexia, fever, weight loss, weight gain, vision loss, hoarseness, chest pain, syncope, peripheral edema, prolonged cough, headaches, hemoptysis, abdominal pain, melena, hematochezia, severe indigestion/heartburn, hematuria, incontinence, muscle weakness, suspicious skin lesions, transient blindness, depression, unusual weight change, abnormal bleeding, enlarged lymph nodes, and angioedema.     Objective:   Physical Exam     WD WN 79 y/o WF in NAD...  GENERAL:  Alert & oriented x 2;  pleasant & cooperative... HEENT:  [place]/AT, EOM-wnl, PERRLA, Fundi-benign, EACs-clear, TMs-wnl, NOSE-clear, THROAT-clear but dentition is very poor. NECK:  Supple w/ adeq ROM; no JVD; carotid impulses OK w/o bruits, +CAE scar on Rt; no thyromegaly or nodules palpated; no lymphadenopathy. CHEST:  Clear to P & A; without wheezes/ rales/ or rhonchi. HEART:  Regular Rhythm; without murmurs/ rubs/ or gallops. ABDOMEN:  Soft & nontender; normal bowel sounds; no organomegaly or masses detected. EXT: without deformities or arthritic changes; she has VI changes w/ 2+edema on Lft & 1+edema on Rt NEURO:  CN's intact; ?no focal neuro deficits (Carrie Grimes detected a field cut & some left sided weakness). DERM:  No lesions noted; no rash- just the bruises & abrasion from fall.  RADIOLOGY DATA:  Reviewed in the EPIC EMR & discussed w/ the patient...  LABORATORY DATA:  Reviewed in the EPIC EMR & discussed w/ the patient...   Assessment & Plan:    Cerebrovasc Dis>  On the ASA/ Plavix now due to insurance coverage...  VI> Edema>  She is reminded to take the Lasix20 every day, avoid salt, elev legs, wear support hose; and incr free water  intake...  CHOL>  On Lip10 & f/u FLP showed Chol numbers ok, but elev TG=172 & needs better low fat diet...  HH/ GERD>  She takes Zantac 150mg /d & doing quite well, no symptoms she says...  Renal Insuffic>  Creat is elev at 1.8 on Lasix20 but this is improved from prev 1.9 on Lasix40; continue same, low sodium, incr fluid intake...  Hx Stroke & Dementia>  MRI 12/12 shows extensive  chronic changes now on ASA/ Plavix as noted; behavioral changes noted are likely a manifestation of her senile dementia> offered Aricept, Ativan, etc but they are ambivalent & will decide & let me know; we finally decided to try Depakote 125mg  Bid to see if it helps to calm her down... She is now ambulating w/ walker & family helps...  Anemia & B12 Defic>  On monthly B12 shots & Fe daily;  Continue same...   Patient's Medications  New Prescriptions   No medications on file  Previous Medications   ACETAMINOPHEN (TYLENOL) 500 MG TABLET    Take 2 tablet by mouth at bedtime   ASPIRIN 81 MG TABLET    Take 81 mg by mouth daily.     ATORVASTATIN (LIPITOR) 10 MG TABLET    TAKE 1 TABLET EVERY DAY   CETIRIZINE (ZYRTEC) 10 MG TABLET    Take 10 mg by mouth daily.     CHOLECALCIFEROL (EQL VITAMIN D3) 1000 UNITS TABLET    Take 1,000 Units by mouth daily.     CLOPIDOGREL (PLAVIX) 75 MG TABLET    Take 1 tablet (75 mg total) by mouth daily.   [facility] RANITIDINE 75 MG TABLET    TAKE ONE TABLET BY MOUTH AT BEDTIME   DEXTROMETHORPHAN POLISTIREX (DELSYM PO)    Take by mouth 2 (two) times daily as needed. Take as directed.   DIVALPROEX (DEPAKOTE) 125 MG DR TABLET    Take 125 mg by mouth 2 (two) times daily.    FUROSEMIDE (LASIX) 20 MG TABLET    Take 1 tablet (20 mg total) by mouth daily.   IRON POLYSACCHARIDES (NIFEREX) 150 MG CAPSULE    Take 150 mg by mouth daily.     MULTIPLE VITAMIN (MULTIVITAMIN) CAPSULE    Take 1 capsule by mouth daily.     SODIUM CHLORIDE (OCEAN) 0.65 % NASAL SPRAY    Place 1 spray into the nose at bedtime.    VITAMIN C (ASCORBIC ACID) 500 MG TABLET    Take 500 mg by mouth daily. To take along with the iron tablet daily   Modified Medications   No medications on file  Discontinued Medications   TICLOPIDINE (TICLID) 250 MG TABLET    TAKE ONE TABLET BY MOUTH TWICE DAILY

## 2012-06-04 ENCOUNTER — Ambulatory Visit (INDEPENDENT_AMBULATORY_CARE_PROVIDER_SITE_OTHER): Payer: Medicare Other

## 2012-06-04 DIAGNOSIS — D649 Anemia, unspecified: Secondary | ICD-10-CM

## 2012-06-05 MED ORDER — CYANOCOBALAMIN 1000 MCG/ML IJ SOLN
1000.0000 ug | Freq: Once | INTRAMUSCULAR | Status: AC
  Administered 2012-06-05: 1000 ug via INTRAMUSCULAR

## 2012-06-27 ENCOUNTER — Other Ambulatory Visit: Payer: Self-pay | Admitting: Pulmonary Disease

## 2012-07-02 ENCOUNTER — Ambulatory Visit: Payer: Medicare Other

## 2012-07-07 ENCOUNTER — Ambulatory Visit (INDEPENDENT_AMBULATORY_CARE_PROVIDER_SITE_OTHER): Payer: Medicare Other

## 2012-07-07 DIAGNOSIS — D649 Anemia, unspecified: Secondary | ICD-10-CM

## 2012-07-08 MED ORDER — CYANOCOBALAMIN 1000 MCG/ML IJ SOLN
1000.0000 ug | Freq: Once | INTRAMUSCULAR | Status: AC
  Administered 2012-07-08: 1000 ug via INTRAMUSCULAR

## 2012-07-11 ENCOUNTER — Other Ambulatory Visit: Payer: Self-pay | Admitting: Pulmonary Disease

## 2012-07-27 ENCOUNTER — Other Ambulatory Visit: Payer: Self-pay | Admitting: Pulmonary Disease

## 2012-08-01 ENCOUNTER — Other Ambulatory Visit: Payer: Self-pay | Admitting: Pulmonary Disease

## 2012-08-03 ENCOUNTER — Ambulatory Visit (INDEPENDENT_AMBULATORY_CARE_PROVIDER_SITE_OTHER): Payer: Medicare Other

## 2012-08-03 DIAGNOSIS — D649 Anemia, unspecified: Secondary | ICD-10-CM

## 2012-08-03 MED ORDER — CYANOCOBALAMIN 1000 MCG/ML IJ SOLN
1000.0000 ug | Freq: Once | INTRAMUSCULAR | Status: AC
  Administered 2012-08-03: 1000 ug via INTRAMUSCULAR

## 2012-08-06 ENCOUNTER — Ambulatory Visit: Payer: Medicare Other

## 2012-09-09 ENCOUNTER — Encounter: Payer: Self-pay | Admitting: Pulmonary Disease

## 2012-09-09 ENCOUNTER — Other Ambulatory Visit (INDEPENDENT_AMBULATORY_CARE_PROVIDER_SITE_OTHER): Payer: Medicare Other

## 2012-09-09 ENCOUNTER — Ambulatory Visit (INDEPENDENT_AMBULATORY_CARE_PROVIDER_SITE_OTHER): Payer: Medicare Other | Admitting: Pulmonary Disease

## 2012-09-09 VITALS — BP 126/72 | HR 64 | Temp 98.4°F | Ht 65.0 in | Wt 146.0 lb

## 2012-09-09 DIAGNOSIS — K449 Diaphragmatic hernia without obstruction or gangrene: Secondary | ICD-10-CM

## 2012-09-09 DIAGNOSIS — F411 Generalized anxiety disorder: Secondary | ICD-10-CM

## 2012-09-09 DIAGNOSIS — I872 Venous insufficiency (chronic) (peripheral): Secondary | ICD-10-CM

## 2012-09-09 DIAGNOSIS — F0391 Unspecified dementia with behavioral disturbance: Secondary | ICD-10-CM

## 2012-09-09 DIAGNOSIS — D510 Vitamin B12 deficiency anemia due to intrinsic factor deficiency: Secondary | ICD-10-CM

## 2012-09-09 DIAGNOSIS — I6350 Cerebral infarction due to unspecified occlusion or stenosis of unspecified cerebral artery: Secondary | ICD-10-CM

## 2012-09-09 DIAGNOSIS — I679 Cerebrovascular disease, unspecified: Secondary | ICD-10-CM

## 2012-09-09 DIAGNOSIS — N259 Disorder resulting from impaired renal tubular function, unspecified: Secondary | ICD-10-CM

## 2012-09-09 DIAGNOSIS — M1990 Unspecified osteoarthritis, unspecified site: Secondary | ICD-10-CM

## 2012-09-09 DIAGNOSIS — R609 Edema, unspecified: Secondary | ICD-10-CM

## 2012-09-09 DIAGNOSIS — R32 Unspecified urinary incontinence: Secondary | ICD-10-CM | POA: Insufficient documentation

## 2012-09-09 DIAGNOSIS — E559 Vitamin D deficiency, unspecified: Secondary | ICD-10-CM

## 2012-09-09 DIAGNOSIS — E7800 Pure hypercholesterolemia, unspecified: Secondary | ICD-10-CM

## 2012-09-09 LAB — BASIC METABOLIC PANEL
BUN: 40 mg/dL — ABNORMAL HIGH (ref 6–23)
CO2: 25 mEq/L (ref 19–32)
Chloride: 110 mEq/L (ref 96–112)
Creatinine, Ser: 1.6 mg/dL — ABNORMAL HIGH (ref 0.4–1.2)

## 2012-09-09 LAB — CBC WITH DIFFERENTIAL/PLATELET
Basophils Relative: 0.6 % (ref 0.0–3.0)
Eosinophils Absolute: 0.1 10*3/uL (ref 0.0–0.7)
Hemoglobin: 11.1 g/dL — ABNORMAL LOW (ref 12.0–15.0)
Lymphs Abs: 1.5 10*3/uL (ref 0.7–4.0)
MCHC: 34.5 g/dL (ref 30.0–36.0)
MCV: 93.4 fl (ref 78.0–100.0)
Monocytes Absolute: 0.6 10*3/uL (ref 0.1–1.0)
Neutro Abs: 5.7 10*3/uL (ref 1.4–7.7)
RBC: 3.43 Mil/uL — ABNORMAL LOW (ref 3.87–5.11)
RDW: 12.9 % (ref 11.5–14.6)

## 2012-09-09 MED ORDER — CYANOCOBALAMIN 1000 MCG/ML IJ SOLN
1000.0000 ug | Freq: Once | INTRAMUSCULAR | Status: AC
  Administered 2012-09-09: 1000 ug via INTRAMUSCULAR

## 2012-09-09 MED ORDER — MIRABEGRON ER 50 MG PO TB24: 50.0000 mg | ORAL_TABLET | Freq: Every day | ORAL | Status: DC

## 2012-09-09 NOTE — Patient Instructions (Addendum)
Today we updated your med list in our EPIC system...    Continue your current medications the same...  We decided to try [facility] 50mg  one tab daily to help the urinary incontinence...    Try the samples for 68mo & call us to let us know if it's helping, using less Depends, etc...  Today we did your follow up blood work...    We will contact you w/ the results when available...   Call for any questions...  Let's plan a follow up visit in 4-1mo, sooner if needed for problems.Marland KitchenMarland Kitchen

## 2012-09-09 NOTE — Progress Notes (Signed)
Subjective:    Patient ID: Carrie Grimes, female    DOB: 10-08-1920, 79 y.o.   MRN: [id number]  HPI 79 y/o WF here for a follow up visit... she has mult med problems including cerebrovasc dis & stroke w/ prev right CAE in 1992;  chr ven insuffic w/ edema;  Hyperchol;  large HH;  mild renal insuffic &  hx UTIs;  DJD & Vit D defic;  senile dementia;  anemia...  ~  April 15, 2011:  22mo ROV & add-on for concern over "mini stroke"> family noted some change in behavior over the last 2wks- pt walking around furniture in an odd way, getting her meds confused (family should have been doing the meds for her all along), and saw her optometrist recently w/ cataracts found but no need for surg yet (?he saw hemorrhage in back of eye and questioned mini-strokes) so they requested this appt;  Recall hx right brain stroke 1992 w/ extensive area of encephalomalacia, & carotid art dis w/ right CAE 1992 by [facility] & taking ASA/ Ticlid ever since (Note: they have refused to consider ch in meds, f/u Brain scans, or f/u CDopplers)...  Now they have now agreed to further testing & we will f/u LABS, MRI Brain, CDopplers ==> pending...  MRI Brain 04/19/11 showed large remote right hemispheric infarct w/ encephalomalacia, prominent sm vessel dis, global atrophy, no hemorrhage or mass, sinus mucosal thickening, CSpine spondylosis...  CDopplers 1/13 showed patent right CAE w/ mild soft plaque in bulb; mild mixed plaque on left bulb & bifurcation w/ 40-59% bilat ICA stenoses (no change)...  Note: at her last visit> baseline Creat noted to be 1.4-1.5; labs 10/12 showed BUN=39, Creat=1.9 so we decr Lasix40 to 1/2 tab daily & incr po fluids;  f/u blood work on the Lasix 20mg /d dose now shows BUN=37, Creat=1.6;  BP remains good at 128/72 today...  Hx Anemia w/ Fe defic & B12 defic on oral Iron & B12 shots monthly xyrs;  Labs today shows Hg=11.3 (stable) w/ MCV=96, Fe=56 (23%sat);  Rec to continue same Fe/ VitC, B12  shots...  ~  June 26, 2011:  54mo ROV & add-on at daugh request for behavioral changes assoc w/ her senile dementia> [name] saw DrWong for Neuro 1/13 & he felt that her abrupt changes noted by family were related to her sudden visual change (?hemorrhage noted by Optometrist & at [facility]) and not evid for additional stroke;  CDopplers 1/13 showed mild mixed plaque w/ 40-59% (low end) bilat ICA stenoses, the prev right CAE & patch angioplasty were patent;  Since her last visit the family has noted some personality changes and agitation which is in stark contrast to her usual very sweet & cooperative demeanor;  She is apparently sl paranoid & daugh wants her checked for a chemical imbalance;  We reviewed avail options and decided to give her a trial of Depakote 125mg Bid> the family understands that they must take control of distrib all meds to the pt...  ~  Sep 04, 2011:  2-20mo ROV & [name] seems mostly back to her sweet, demented, sl loud & HOH self> the Depakote seems to be helping & family is pleased as she is easier to manage now;  She had a fall 4/13 & saw TP w/o apparent injury other than soreness & some bruising but pt notes that she is getting over it;  She also had a hemorrhage behind her right eye & Optometrist sent her to DrSanders & he advised against any  surg according to the family, we do not have records but apparently there is no change in her vision...    ~  January 06, 2012:  72mo ROV & family notes sl more confused> she can remember from long ago but not recent events; she wears depends for Uincont & they note increased #of pads recently- we will check UA and C&S to be sure no UTI; Depakote has really helped her behavioral issues but they have a hard time taking the eve dose; she is drinking Ensure to help her nutrition; grandson getting married this weekend- reminded to get in that 2nd dose of Seroquel; OK B12 shot & Flu shot today...    Daughter shared an upcoming dilemma> she has renal dis  from DM w/ Cr=6 & facing dialysis; mother really can't be left alone & they are trying to work out future plans.    We received report of eye care from [place] 6/13> bilat cats, & branch retinal vein occlusion right eye; they continue to monitor the situation...    Weight is up 4# to 141# today; BP=134/66 w/ 2+edema L>R on LASIX20 & low sodium> will recheck labs & see if Lasix can be incr(Cr in 2012 ranged 1.4=>1.9). We reviewed prob list, meds, xrays and labs> see below for updates >> LABS 9/13:  FLP- at goals on Lip10 x TG=172;  Chems- ok x BUN=39 Creat=1.8;  CBC- ok x Hg=11.1;  TSH=2.30;  UA looks clear but C&S pos for pan-sens EColi... NOTE> UTI Rx w/ Cipro;  We can't incr the Lasix, therefore rec better low sodium diet & support hose...  ~  May 05, 2012:  65mo ROV & family notes incr incont & use of depends- we discussed Kaegel exercises for better continence & offered trial Mybetriq but they don't want new meds;  They report Dx w/ Glaucoma from eye doc on gtts now...     AR, HOH, Dental caries> we reviewed OTC meds & advised hearing eval, hearing aides, & dental work...    Cerebrovasc dis, Hx Stroke>  On ASA & Ticlid; she has been stable w/o cerebral ischemic symptoms on these meds; but they called 12/13 stating insurance won't pay for Ticlid & Plavix is just $8 on their plan therefore switched to ASA/ Plavix...    VI/ Edema>  On low sodium diet & Lasix20; wt is stable at 142# w/ VI changes and trace-1+ edema in LEs...    Chol>  On Lip10; last FLP 9/13 showed TChol 147, TG 172, HDL 42, LDL 71; continue same med & better low fat diet!    HH>  On Antireflux regimen & Ranitadine; she has large HH seen on prev CT scan; denies CP, Abd pain, N/ V, dysphagia etc...    Renal Insuffic>  Baseline Creat= 1.4-1.5; labs 2012 showed Creat up to1.9 & we decr Lasix40 to 1/2 tab daily & incr po fluids.    Vit D Defic>  On MVI + VitD 2000u daily; encouraged to take the vit D every day...     Senile dementia>  She is pleasantly demented & cared for beautifully by her family...    Hx Anemia>  On MVI + Niferex; labs 9/13 showed Hg= 11.1, MCV=96; rec to continue her Fe, VitC & supplements...    Vit B12 Defic>  On B12 shots monthly... We reviewed prob list, meds, xrays and labs> see below for updates >> she had the 2013 flu vaccine 9/13... Given Pneumonia & TDAP updates in 2010.  ~  Sep 09, 2012:  30mo ROV & daugh notes some incr difficulty dealing w/ pt- argumentative, not cooperating, etc; [place] 7 her husb have health issues and they are contemplating the eventuality of putting MrsChildress in NH... We reviewed the following medical problems during today's office visit >>     AR, HOH, Dental caries> we reviewed OTC meds & advised hearing eval, hearing aides, & dental work (she refuses dentist); states they've been told by ENT that hearing aides wouldn't help...    Cerebrovasc dis, Hx Stroke>  On [id number] & Plavix75; she has been stable w/o cerebral ischemic symptoms; prev MRI w/ large infarct on right w/ encephalomalacia, sm vessel dis, atrophy...    VI/ Edema>  On low sodium diet & Lasix20; wt is up 4# at 146# w/ VI changes and 1-2+ edema in LEs; she knows to elim salt, elev legs, wear support hose...    Chol>  On Lip10; last FLP 9/13 showed TChol 147, TG 172, HDL 42, LDL 71; continue same med & better low fat diet!    HH>  On Antireflux regimen & Ranitadine75Qhs; she has large HH seen on prev CT scan; denies CP, abd pain, N/ V, dysphagia etc...    Renal Insuffic>  Creat= 1.4-1.9 on Lasix20 w/ mod edema; Labs 5/14 showed BUN=40, Cr= 1.6 (stable), therefore continue the same for now...    Urinary Incont> she wears Depends (daugh noted 5/14 that it costs>$100/mo); we decided to try Myrbetriq50/d to see if this helps...    Vit D Defic>  On MVI + VitD 2000u daily; encouraged to take the Vit D every day...    Senile dementia>  She was pleasantly demented & cared for beautifully by her family- starked  having some behavioral issues & DrWong added Depakote sprinkle 125Bid...     Hx Anemia>  On MVI + Niferex150/d; Labs 5/14 showed Hg=11.1, MCV=93, & Fe=42 (15%sat)- rec to continue her Fe, VitC & supplements...    Vit B12 Defic>  On B12 shots monthly... We reviewed prob list, meds, xrays and labs> see below for updates >> they did not bring med list or bottles to the OV today... LABS 5/14:  Chems- ok x BUN=40, Cr=1.6 (stable);  CBC- ok x Hg=11.1 & Fe=42 (15%sat);  TSH=2.01             Problem List:       ALLERGIC RHINITIS (ICD-477.9) - she uses Claritin & Flonase as needed... she has 2 cats- Heidi & Aline Brochure!  HEARING LOSS (ICD-389.9) - she doesn't have hearing aides- "it wouldn't work"...  DENTAL CARIES (ICD-521.00) - she has been repeatedly reminded to seek dental help, but refuses... family purees her foods...  CEREBROVASCULAR DISEASE (ICD-437.9) - s/p right CAE by DrLawson 1992> stable on ASA 81mg /d and TICLID 250mg Bid ever since then,but insurance wouldn't cover Ticlid starting in 2014 therefore switched to ASA81mg /d & PLAVIX 75mg /d... she had been doing well and was asymptomatic- denying focal weakness, numbness, paresthesia, pain, slurred speech, headache, dizziness, tremor, or seizure. ~  12/12: presented w/ some behavior change & family requested further eval:  MRI 12/12 showed large remote right hemispheric infarct w/ encephalomalacia, prominent sm vessel dis, global atrophy, no hemorrhage or mass, sinus mucosal thickening, CSpine spondylosis...  CDopplers 1/13 showed mild mixed plaque w/ 40-59% (low end) bilat ICA stenoses, the prev right CAE & patch angioplasty were patent... ~  1/13:  She saw DrWong for Neuro & his note is reviewed> he felt some of her recent motor problems were  related to her visual changes & there was no evid for new stroke... Continue ASA/ Ticlid. ~  1/14:  She was switched to ASA 81,g/d & PLAVIX 75mg /d due to insurance coverage => stable...  VENOUS INSUFFICIENCY,  CHRONIC (ICD-459.81) - she has chronic LE edema- on LASIX 20mg /d now and low sodium diet... she had gained 23# to 152# in 2009 w/ Ensure & puree diet from famiy, now down to 140#... advises on low sodium, elevate legs, support hose... ~  6/11:  BUN=40, Creat=1.6 on the Lasix80 & asked to decr to 40mg /d and restrict sodium. ~  12/11:  labs showed BUN=41, Creat=1.5 on Lasix40... Baseline Creat ~1.4-1.5 ~  10/12:  Labs showed BUN=39, Creat=1.9.Marland KitchenMarland Kitchen rec to decr lasix further to 20mg  daily... ~  Since then she has gained a few lbs w/ incr edema, Creat stable ~1.6, advised NO SALT, elevation, support hose...  HYPERCHOLESTEROLEMIA (ICD-272.0) - on diet + LIPITOR 10mg /d... her TG's have been elevated but she refuses more meds. ~  labs 3/08 showed TChol 183, TG 205, HDL 54, LDL 98 ~  labs 6/09 showed TChol 190, TG 314, HDL 43, LDL 94... she refuses more meds, rec low fat diet. ~  labs 6/10 showed TChol 184, TG 279, HDL 50, LDL 97 ~  labs 6/11 showed TChol 180, TG 277, HDL 49, LDL 95 ~  FLP 12/11 on Lip10 showed TChol 168. TH 184, HDL50, LDL 81 ~  FLP 10/12 on Lip10 showed TChol 151, TG 240, HDL 44, LDL 75 & rec for better low fat diet. ~  FLP 9/13 on Lip10 showed TChol 147, TG 172, HDL 42, LDL 71   HIATAL HERNIA (ICD-553.3) - large HH seen on prev CT Abd in 2001... takes ZANTAC 75mg  Qhs...  RENAL INSUFFICIENCY (ICD-588.9) - Creat= 1.5 to 1.7 in 2008... ~  labs 6/09 showed BUN= 33, Creat= 1.4 ~  labs 6/10 showed BUN= 36, Creat= 1.5 ~  labs 12/10 showed BUN= 33, Creat= 1.4 ~  labs 6/11 showed BUN=40, Creat=1.6.Marland KitchenMarland Kitchen rec to decr Lasix from 80 to 40mg /d. ~  labs 12/11 showed BUN=41, creat=1.5 ~  Labs 3/12 Hosp showed Creat 2.01 ==> 1.38 ~  Labs 10/12 showed BUN=39, Creat=1.9 & we rec that she decr Lasix to 20mg /d. ~  Labs 12/12 on Lasix20 showed BUN=37, Creat=1.6.Marland KitchenMarland Kitchen BP is wnl, continue same meds. ~  Labs 9/13 on Lasix20 showed BUN=39, Creat=1.8.Marland KitchenMarland Kitchen Continue same... ~  Labs 5/14 on Lasix20 showed BUN=40,  Cr=1.6.Marland KitchenMarland Kitchen Continue same  UTI'S, HX OF (ICD-V13.00)  VITAMIN D DEFICIENCY (ICD-268.9) - she is likely osteoporotic but unmeasured due to refusal of BMD & Bisphos therapy options... ~  labs 6/09 showed Vit D level = 26... rec to take OTC Vit D 1000 u daily... ~  labs 6/10 showed Vit D level = 24... rec to incr Vit D to 2000 u daily.  Hx of STROKE (ICD-434.91) - hx right brain stroke in 1992 w/ CTBrain showing right post temporal & parietal low density area w/ encephalomalacia... ~  She remains on ASA 81mg /d & TICLID 250mg Bid w/o acute cerebral ischemic symptoms... ~  12/12: she presented w/ some behavioral changes but no focal neuro deficits; they requested further eval after 43yrs of refusing f/u scans, dopplers, etc... ~  MRI Brain 12/12 showed large remote right hemispheric infarct w/ encephalomalacia, prominent sm vessel dis, global atrophy, no hemorrhage or mass, sinus mucosal thickening, CSpine spondylosis... ~  CDopplers 1/13 showed mild mixed plaque w/ 40-59% (low end) bilat ICA stenoses, the prev right  CAE & patch angioplasty were patent... ~  1/13: she saw DrWong for Neuro & his note is reviewed> he felt some of her recent motor problems were related to her visual changes & there was no evid for new stroke... ~  1/14:  Insurance will no longer cover her ticlid & switched to PLAVIX75 + her ASA81 => stable on this...  SENILE DEMENTIA (ICD-290.0) ~  2/13:  Due to behavioral changes and the family's growing frustration we decided to try Depakote 125mg  Bid... ~  5/13:  They report improvement in behavior back toward baseline, hopefully this will continue, same Rx... ~  5/14:  Daughter notes pt more argumentative, slowly progressive behavioral issues and they are considering NHP due to this & their health issues...  ANEMIA (ICD-285.9) & PERNICIOUS ANEMIA (ICD-281.0) - on monthly B12 shots for years... in 2006 she was also iron deficient and received IV iron therapy (she refused GI eval)... on  FeSO4 daily... she feels well and denies weakness, fatigue, bleeding, dark stools, etc... ~  labs 3/08 showed Hg= 13.2 ~  labs 6/09 showed Hg= 12.7 ~  labs 6/10 showed Hg= 11.7... Fe, B12, Folate were all WNL. ~  labs 12/10 showed Hg= 12.4 ~  labs 12/11 showed Hg= 12.2, Fe=106 (34%sat)... ~  Labs 3/12 Hosp showed Hg= 12.2 ==> 10.4 ~  Labs 10/12 showed Hg= 11.3, MCV= 96 ~  Labs 12/12 showed Hg=11.3 w/ MCV=96, Fe=56 (23%sat); Rec to continue same Fe/ VitC, B12 shots... ~  Labs 9/13 showed Hg= 11.1 ~  Labs 5/14 showed Hg= 11.1, MCV=93, Fe= 42 (15%sat)... Continue same.  NOTE>> She has repeatedly refused GYN exams, PAP smears, Mammograms, & Colonoscopy...   Past Surgical History  Procedure Laterality Date  . Cholecystectomy    . Carotid endarterectomy      Outpatient Encounter Prescriptions as of 09/09/2012  Medication Sig Dispense Refill  . acetaminophen (TYLENOL) 500 MG tablet Take 2 tablet by mouth at bedtime      . aspirin 81 MG tablet Take 81 mg by mouth daily.        Marland Kitchen atorvastatin (LIPITOR) 10 MG tablet TAKE 1 TABLET EVERY DAY  30 tablet  5  . cetirizine (ZYRTEC) 10 MG tablet Take 10 mg by mouth daily.        . Cholecalciferol (EQL VITAMIN D3) 1000 UNITS tablet Take 1,000 Units by mouth daily.        . clopidogrel (PLAVIX) 75 MG tablet Take 1 tablet (75 mg total) by mouth daily.  30 tablet  6  . [facility] RANITIDINE 75 MG tablet TAKE ONE TABLET BY MOUTH AT BEDTIME  30 tablet  9  . Dextromethorphan Polistirex (DELSYM PO) Take by mouth 2 (two) times daily as needed. Take as directed.      . divalproex (DEPAKOTE SPRINKLE) 125 MG capsule TAKE 1 CAPSULE TWICE DAILY  60 capsule  5  . furosemide (LASIX) 20 MG tablet TAKE 1 TABLET EVERY DAY  30 tablet  11  . iron polysaccharides (NIFEREX) 150 MG capsule Take 150 mg by mouth daily.        . Multiple Vitamin (MULTIVITAMIN) capsule Take 1 capsule by mouth daily.        . sodium chloride (OCEAN) 0.65 % nasal spray Place 1 spray into the nose at  bedtime.      . vitamin C (ASCORBIC ACID) 500 MG tablet Take 500 mg by mouth daily. To take along with the iron tablet daily       . [  DISCONTINUED] [facility]S RANITIDINE 75 MG tablet TAKE ONE TABLET BY MOUTH AT BEDTIME  30 tablet  9  . [DISCONTINUED] divalproex (DEPAKOTE) 125 MG DR tablet Take 125 mg by mouth 2 (two) times daily.        No facility-administered encounter medications on file as of 09/09/2012.    Allergies  Allergen Reactions  . Procaine Hcl     REACTION: pt states reaction to shot at dentist office w/ tachycardia \\T \ SOB (? if really from combination w/epi)    Current Medications, Allergies, Past Medical History, Past Surgical History, Family History, and Social History were reviewed in [employer] record.      Review of Systems       See HPI - all other systems neg except as noted...      The patient complains of decreased hearing, dyspnea on exertion, and difficulty walking.  The patient denies anorexia, fever, weight loss, weight gain, vision loss, hoarseness, chest pain, syncope, peripheral edema, prolonged cough, headaches, hemoptysis, abdominal pain, melena, hematochezia, severe indigestion/heartburn, hematuria, incontinence, muscle weakness, suspicious skin lesions, transient blindness, depression, unusual weight change, abnormal bleeding, enlarged lymph nodes, and angioedema.     Objective:   Physical Exam     WD WN 79 y/o WF in NAD...  GENERAL:  Alert & oriented x 2;  pleasant & cooperative... HEENT:  [place]/AT, EOM-wnl, PERRLA, Fundi-benign, EACs-clear, TMs-wnl, NOSE-clear, THROAT-clear but dentition is very poor. NECK:  Supple w/ adeq ROM; no JVD; carotid impulses OK w/o bruits, +CAE scar on Rt; no thyromegaly or nodules palpated; no lymphadenopathy. CHEST:  Clear to P & A; without wheezes/ rales/ or rhonchi. HEART:  Regular Rhythm; without murmurs/ rubs/ or gallops. ABDOMEN:  Soft & nontender; normal bowel sounds; no organomegaly or masses  detected. EXT: without deformities or arthritic changes; she has VI changes w/ 2+edema on Lft & 1+edema on Rt NEURO:  CN's intact; ?no focal neuro deficits (DrWong detected a field cut & some left sided weakness). DERM:  No lesions noted; no rash- just the bruises & abrasion from fall.  RADIOLOGY DATA:  Reviewed in the EPIC EMR & discussed w/ the patient...  LABORATORY DATA:  Reviewed in the EPIC EMR & discussed w/ the patient...   Assessment & Plan:    Cerebrovasc Dis>  On the ASA/ Plavix now due to insurance coverage & stable...  VI> Edema>  She is reminded to take the Lasix20 every day, avoid salt, elev legs, wear support hose; and incr free water intake...  CHOL>  On Lip10 & f/u FLP showed Chol numbers ok, but needs better low fat diet...  HH/ GERD>  She takes Zantac 150mg /d & doing quite well, no symptoms she says...  Renal Insuffic>  Creat is stable at 1.6 on Lasix20 (improved from prev 1.9 on Lasix40); continue same, low sodium, incr fluid intake...  Hx Stroke & Dementia>  MRI 12/12 shows extensive chronic changes now on ASA/ Plavix as noted; behavioral changes noted are likely a manifestation of her senile dementia> offered Aricept, Ativan, etc but they are ambivalent & will decide & let me know; we finally decided to try Depakote 125mg  Bid to see if it helps to calm her down... She is now ambulating w/ walker & family helps...  Anemia & B12 Defic>  On monthly B12 shots & Fe daily;  Continue same...   Patient's Medications  New Prescriptions   No medications on file  Previous Medications   ACETAMINOPHEN (TYLENOL) 500 MG TABLET  Take 2 tablet by mouth at bedtime   ASPIRIN 81 MG TABLET    Take 81 mg by mouth daily.     ATORVASTATIN (LIPITOR) 10 MG TABLET    TAKE 1 TABLET EVERY DAY   CETIRIZINE (ZYRTEC) 10 MG TABLET    Take 10 mg by mouth daily.     CHOLECALCIFEROL (EQL VITAMIN D3) 1000 UNITS TABLET    Take 1,000 Units by mouth daily.     CLOPIDOGREL (PLAVIX) 75 MG TABLET     Take 1 tablet (75 mg total) by mouth daily.   CVS RANITIDINE 75 MG TABLET    TAKE ONE TABLET BY MOUTH AT BEDTIME   DEXTROMETHORPHAN POLISTIREX (DELSYM PO)    Take by mouth 2 (two) times daily as needed. Take as directed.   DIVALPROEX (DEPAKOTE SPRINKLE) 125 MG CAPSULE    TAKE 1 CAPSULE TWICE DAILY   FUROSEMIDE (LASIX) 20 MG TABLET    TAKE 1 TABLET EVERY DAY   IRON POLYSACCHARIDES (NIFEREX) 150 MG CAPSULE    Take 150 mg by mouth daily.     MULTIPLE VITAMIN (MULTIVITAMIN) CAPSULE    Take 1 capsule by mouth daily.     SODIUM CHLORIDE (OCEAN) 0.65 % NASAL SPRAY    Place 1 spray into the nose at bedtime.   VITAMIN C (ASCORBIC ACID) 500 MG TABLET    Take 500 mg by mouth daily. To take along with the iron tablet daily   Modified Medications   No medications on file  Discontinued Medications   [facility]S RANITIDINE 75 MG TABLET    TAKE ONE TABLET BY MOUTH AT BEDTIME   DIVALPROEX (DEPAKOTE) 125 MG DR TABLET    Take 125 mg by mouth 2 (two) times daily.

## 2012-10-13 ENCOUNTER — Ambulatory Visit (INDEPENDENT_AMBULATORY_CARE_PROVIDER_SITE_OTHER): Payer: Medicare Other

## 2012-10-13 DIAGNOSIS — D649 Anemia, unspecified: Secondary | ICD-10-CM

## 2012-10-14 MED ORDER — CYANOCOBALAMIN 1000 MCG/ML IJ SOLN
1000.0000 ug | Freq: Once | INTRAMUSCULAR | Status: AC
  Administered 2012-10-14: 1000 ug via INTRAMUSCULAR

## 2012-10-26 ENCOUNTER — Other Ambulatory Visit: Payer: Self-pay | Admitting: Pulmonary Disease

## 2012-10-31 ENCOUNTER — Other Ambulatory Visit: Payer: Self-pay | Admitting: Pulmonary Disease

## 2012-11-17 ENCOUNTER — Ambulatory Visit (INDEPENDENT_AMBULATORY_CARE_PROVIDER_SITE_OTHER): Payer: Medicare Other

## 2012-11-17 DIAGNOSIS — D649 Anemia, unspecified: Secondary | ICD-10-CM

## 2012-11-20 MED ORDER — CYANOCOBALAMIN 1000 MCG/ML IJ SOLN
1000.0000 ug | Freq: Once | INTRAMUSCULAR | Status: AC
  Administered 2012-11-20: 1000 ug via INTRAMUSCULAR

## 2012-11-29 ENCOUNTER — Other Ambulatory Visit: Payer: Self-pay | Admitting: Pulmonary Disease

## 2012-11-30 ENCOUNTER — Telehealth: Payer: Self-pay | Admitting: Pulmonary Disease

## 2012-11-30 NOTE — Telephone Encounter (Addendum)
Pt's daughter can also be reached at [phone].  Pt's daughter would like to know if it is ok to get B-12 shot on 8/15.  Last one was on 11/17/12.    Satira Anis

## 2012-11-30 NOTE — Telephone Encounter (Signed)
Called and spoke with pts daughter and she is aware of appt with SN on 8-6 at 9:30.  She is aware of cxr prior to appt.  Nothing further is needed.

## 2012-11-30 NOTE — Telephone Encounter (Signed)
Spoke with Hassan Rowan (pt daughter) FYI: Saw Eye Dr yesterday-- States that pt has been having hemorrhaging in the back of her eye x 1 year with Dr Tye Savoy.  Daughter states that today at Dr Hassell Done office they dx her with frontal hemorrhaging in her eye.  States that pt speaks of "black out" spells recently. Glaucoma screening--pressure 16  Concerned with what could be causing her "black outs".  Daughter wanting to know if SN feels pt needs to be seen sooner than Oct 2014 appt or if SN feels she will be okay until then. Please advise Dr Lenna Gilford if pt needs to schedule sooner.

## 2012-12-02 ENCOUNTER — Encounter: Payer: Self-pay | Admitting: *Deleted

## 2012-12-02 ENCOUNTER — Encounter: Payer: Self-pay | Admitting: Pulmonary Disease

## 2012-12-02 ENCOUNTER — Ambulatory Visit (INDEPENDENT_AMBULATORY_CARE_PROVIDER_SITE_OTHER)
Admission: RE | Admit: 2012-12-02 | Discharge: 2012-12-02 | Disposition: A | Discharge status: Home / Self Care | Payer: Medicare Other | Source: Ambulatory Visit | Attending: Pulmonary Disease | Admitting: Pulmonary Disease

## 2012-12-02 ENCOUNTER — Other Ambulatory Visit (INDEPENDENT_AMBULATORY_CARE_PROVIDER_SITE_OTHER): Payer: Medicare Other

## 2012-12-02 ENCOUNTER — Encounter (INDEPENDENT_AMBULATORY_CARE_PROVIDER_SITE_OTHER): Payer: Medicare Other

## 2012-12-02 ENCOUNTER — Ambulatory Visit (INDEPENDENT_AMBULATORY_CARE_PROVIDER_SITE_OTHER): Payer: Medicare Other | Admitting: Pulmonary Disease

## 2012-12-02 VITALS — BP 148/70 | HR 60 | Temp 98.4°F | Ht 64.0 in | Wt 151.0 lb

## 2012-12-02 DIAGNOSIS — F411 Generalized anxiety disorder: Secondary | ICD-10-CM

## 2012-12-02 DIAGNOSIS — E7800 Pure hypercholesterolemia, unspecified: Secondary | ICD-10-CM

## 2012-12-02 DIAGNOSIS — R55 Syncope and collapse: Secondary | ICD-10-CM

## 2012-12-02 DIAGNOSIS — I6350 Cerebral infarction due to unspecified occlusion or stenosis of unspecified cerebral artery: Secondary | ICD-10-CM

## 2012-12-02 DIAGNOSIS — I679 Cerebrovascular disease, unspecified: Secondary | ICD-10-CM

## 2012-12-02 DIAGNOSIS — R609 Edema, unspecified: Secondary | ICD-10-CM

## 2012-12-02 DIAGNOSIS — F419 Anxiety disorder, unspecified: Secondary | ICD-10-CM

## 2012-12-02 DIAGNOSIS — D510 Vitamin B12 deficiency anemia due to intrinsic factor deficiency: Secondary | ICD-10-CM

## 2012-12-02 DIAGNOSIS — F0391 Unspecified dementia with behavioral disturbance: Secondary | ICD-10-CM

## 2012-12-02 DIAGNOSIS — E538 Deficiency of other specified B group vitamins: Secondary | ICD-10-CM

## 2012-12-02 DIAGNOSIS — D649 Anemia, unspecified: Secondary | ICD-10-CM

## 2012-12-02 DIAGNOSIS — N259 Disorder resulting from impaired renal tubular function, unspecified: Secondary | ICD-10-CM

## 2012-12-02 HISTORY — PX: OTHER SURGICAL HISTORY: SHX169

## 2012-12-02 LAB — BASIC METABOLIC PANEL
BUN: 39 mg/dL — ABNORMAL HIGH (ref 6–23)
CO2: 26 mEq/L (ref 19–32)
Chloride: 112 mEq/L (ref 96–112)
Creatinine, Ser: 1.7 mg/dL — ABNORMAL HIGH (ref 0.4–1.2)

## 2012-12-02 LAB — HEPATIC FUNCTION PANEL
ALT: 12 U/L (ref 0–35)
AST: 15 U/L (ref 0–37)
Bilirubin, Direct: 0.1 mg/dL (ref 0.0–0.3)
Total Protein: 6.8 g/dL (ref 6.0–8.3)

## 2012-12-02 LAB — CBC WITH DIFFERENTIAL/PLATELET
Basophils Absolute: 0 10*3/uL (ref 0.0–0.1)
Basophils Relative: 0.5 % (ref 0.0–3.0)
Eosinophils Absolute: 0.1 10*3/uL (ref 0.0–0.7)
Lymphocytes Relative: 17.8 % (ref 12.0–46.0)
MCHC: 33.6 g/dL (ref 30.0–36.0)
Neutrophils Relative %: 72.7 % (ref 43.0–77.0)
Platelets: 147 10*3/uL — ABNORMAL LOW (ref 150.0–400.0)
RBC: 3.14 Mil/uL — ABNORMAL LOW (ref 3.87–5.11)

## 2012-12-02 LAB — TSH: TSH: 0.81 u[IU]/mL (ref 0.35–5.50)

## 2012-12-02 LAB — LIPID PANEL
Cholesterol: 133 mg/dL (ref 0–200)
Total CHOL/HDL Ratio: 3
Triglycerides: 132 mg/dL (ref 0.0–149.0)

## 2012-12-02 LAB — IBC PANEL
Iron: 61 ug/dL (ref 42–145)
Transferrin: 211.7 mg/dL — ABNORMAL LOW (ref 212.0–360.0)

## 2012-12-02 NOTE — Progress Notes (Signed)
Patient ID: Carrie Grimes, female   DOB: 06-05-1920, 79 y.o.   MRN: [id number] E-Cardio verite 30 day event monitor applied to patient.

## 2012-12-02 NOTE — Progress Notes (Addendum)
Subjective:    Patient ID: Carrie Grimes, female    DOB: [birth year], 79 y.o.   MRN: [id number]  HPI 79 y/o WF here for a follow up visit... she has mult med problems including cerebrovasc dis & stroke w/ prev right CAE in 1992;  chr ven insuffic w/ edema;  Hyperchol;  large HH;  mild renal insuffic &  hx UTIs;  DJD & Vit D defic;  senile dementia;  anemia...  ~  April 15, 2011:  30mo ROV & add-on for concern over "mini stroke"> family noted some change in behavior over the last 2wks- pt walking around furniture in an odd way, getting her meds confused (family should have been doing the meds for her all along), and saw her optometrist recently w/ cataracts found but no need for surg yet (?he saw hemorrhage in back of eye and questioned mini-strokes) so they requested this appt;  Recall hx right brain stroke 1992 w/ extensive area of encephalomalacia, & carotid art dis w/ right CAE 1992 by [facility] & taking ASA/ Ticlid ever since (Note: they have refused to consider ch in meds, f/u Brain scans, or f/u CDopplers)...  Now they have now agreed to further testing & we will f/u LABS, MRI Brain, CDopplers ==> pending...  MRI Brain 04/19/11 showed large remote right hemispheric infarct w/ encephalomalacia, prominent sm vessel dis, global atrophy, no hemorrhage or mass, sinus mucosal thickening, CSpine spondylosis...  CDopplers 1/13 showed patent right CAE w/ mild soft plaque in bulb; mild mixed plaque on left bulb & bifurcation w/ 40-59% bilat ICA stenoses (no change)...  Note: at her last visit> baseline Creat noted to be 1.4-1.5; labs 10/12 showed BUN=39, Creat=1.9 so we decr Lasix40 to 1/2 tab daily & incr po fluids;  f/u blood work on the Lasix 20mg /d dose now shows BUN=37, Creat=1.6;  BP remains good at 128/72 today...  Hx Anemia w/ Fe defic & B12 defic on oral Iron & B12 shots monthly xyrs;  Labs today shows Hg=11.3 (stable) w/ MCV=96, Fe=56 (23%sat);  Rec to continue same Fe/ VitC, B12  shots...  ~  June 26, 2011:  59mo ROV & add-on at daugh request for behavioral changes assoc w/ her senile dementia> [name] saw DrWong for Neuro 1/13 & he felt that her abrupt changes noted by family were related to her sudden visual change (?hemorrhage noted by Optometrist & at [facility]) and not evid for additional stroke;  CDopplers 1/13 showed mild mixed plaque w/ 40-59% (low end) bilat ICA stenoses, the prev right CAE & patch angioplasty were patent;  Since her last visit the family has noted some personality changes and agitation which is in stark contrast to her usual very sweet & cooperative demeanor;  She is apparently sl paranoid & daugh wants her checked for a chemical imbalance;  We reviewed avail options and decided to give her a trial of Depakote 125mg Bid> the family understands that they must take control of distrib all meds to the pt...  ~  Sep 04, 2011:  2-2mo ROV & [name] seems mostly back to her sweet, demented, sl loud & HOH self> the Depakote seems to be helping & family is pleased as she is easier to manage now;  She had a fall 4/13 & saw TP w/o apparent injury other than soreness & some bruising but pt notes that she is getting over it;  She also had a hemorrhage behind her right eye & Optometrist sent her to DrSanders & he advised against any  surg according to the family, we do not have records but apparently there is no change in her vision...    ~  January 06, 2012:  28mo ROV & family notes sl more confused> she can remember from long ago but not recent events; she wears depends for Uincont & they note increased #of pads recently- we will check UA and C&S to be sure no UTI; Depakote has really helped her behavioral issues but they have a hard time taking the eve dose; she is drinking Ensure to help her nutrition; grandson getting married this weekend- reminded to get in that 2nd dose of Seroquel; OK B12 shot & Flu shot today...    Daughter shared an upcoming dilemma> she has renal dis  from DM w/ Cr=6 & facing dialysis; mother really can't be left alone & they are trying to work out future plans.    We received report of eye care from [place] 6/13> bilat cats, & branch retinal vein occlusion right eye; they continue to monitor the situation...    Weight is up 4# to 141# today; BP=134/66 w/ 2+edema L>R on LASIX20 & low sodium> will recheck labs & see if Lasix can be incr(Cr in 2012 ranged 1.4=>1.9). We reviewed prob list, meds, xrays and labs> see below for updates >> LABS 9/13:  FLP- at goals on Lip10 x TG=172;  Chems- ok x BUN=39 Creat=1.8;  CBC- ok x Hg=11.1;  TSH=2.30;  UA looks clear but C&S pos for pan-sens EColi... NOTE> UTI Rx w/ Cipro;  We can't incr the Lasix, therefore rec better low sodium diet & support hose...  ~  May 05, 2012:  73mo ROV & family notes incr incont & use of depends- we discussed Kaegel exercises for better continence & offered trial Mybetriq but they don't want new meds;  They report Dx w/ Glaucoma from eye doc on gtts now...     AR, HOH, Dental caries> we reviewed OTC meds & advised hearing eval, hearing aides, & dental work...    Cerebrovasc dis, Hx Stroke>  On ASA & Ticlid; she has been stable w/o cerebral ischemic symptoms on these meds; but they called 12/13 stating insurance won't pay for Ticlid & Plavix is just $8 on their plan therefore switched to ASA/ Plavix...    VI/ Edema>  On low sodium diet & Lasix20; wt is stable at 142# w/ VI changes and trace-1+ edema in LEs...    Chol>  On Lip10; last FLP 9/13 showed TChol 147, TG 172, HDL 42, LDL 71; continue same med & better low fat diet!    HH>  On Antireflux regimen & Ranitadine; she has large HH seen on prev CT scan; denies CP, Abd pain, N/ V, dysphagia etc...    Renal Insuffic>  Baseline Creat= 1.4-1.5; labs 2012 showed Creat up to1.9 & we decr Lasix40 to 1/2 tab daily & incr po fluids.    Vit D Defic>  On MVI + VitD 2000u daily; encouraged to take the vit D every day...     Senile dementia>  She is pleasantly demented & cared for beautifully by her family...    Hx Anemia>  On MVI + Niferex; labs 9/13 showed Hg= 11.1, MCV=96; rec to continue her Fe, VitC & supplements...    Vit B12 Defic>  On B12 shots monthly... We reviewed prob list, meds, xrays and labs> see below for updates >> she had the 2013 flu vaccine 9/13... Given Pneumonia & TDAP updates in 2010.  ~  Sep 09, 2012:  53mo ROV & daugh notes some incr difficulty dealing w/ pt- argumentative, not cooperating, etc; Edmond[place] 7 her husb have health issues and they are contemplating the eventuality of putting MrsChildress in NH... We reviewed the following medical problems during today's office visit >>     AR, HOH, Dental caries> we reviewed OTC meds & advised hearing eval, hearing aides, & dental work (she refuses dentist); states they've been told by ENT that hearing aides wouldn't help...    Cerebrovasc dis, Hx Stroke>  On [id number] & Plavix75; she has been stable w/o cerebral ischemic symptoms; s/p RCAE 1992; prev MRI w/ large infarct on right w/ encephalomalacia, sm vessel dis, atrophy...    VI/ Edema>  On low sodium diet & Lasix20; wt is up 4# at 146# w/ VI changes and 1-2+ edema in LEs; she knows to elim salt, elev legs, wear support hose...    Chol>  On Lip10; last FLP 9/13 showed TChol 147, TG 172, HDL 42, LDL 71; continue same med & better low fat diet!    HH>  On Antireflux regimen & Ranitadine75Qhs; she has large HH seen on prev CT scan; denies CP, abd pain, N/ V, dysphagia etc...    Renal Insuffic>  Creat= 1.4-1.9 on Lasix20 w/ mod edema; Labs 5/14 showed BUN=40, Cr= 1.6 (stable), therefore continue the same for now...    Urinary Incont> she wears Depends (daugh noted 5/14 that it costs>$100/mo); we decided to try Myrbetriq50/d to see if this helps...    Vit D Defic>  On MVI + VitD 2000u daily; encouraged to take the Vit D every day...    Senile dementia>  She was pleasantly demented & cared for beautifully by her  family- starked having some behavioral issues & DrWong added Depakote Sprinkle 125Bid...     Hx Anemia>  On MVI + Niferex150/d; Labs 5/14 showed Hg=11.1, MCV=93, & Fe=42 (15%sat)- rec to continue her Fe, VitC & supplements...    Vit B12 Defic>  On B12 shots monthly... We reviewed prob list, meds, xrays and labs> see below for updates >> they did not bring med list or bottles to the OV today... LABS 5/14:  Chems- ok x BUN=40, Cr=1.6 (stable);  CBC- ok x Hg=11.1 & Fe=42 (15%sat);  TSH=2.01     ~  December 02, 2012:  8mo ROV & add-on per family request for ?syncope> pt/family relate a convoluted hx- she saw her eye doc DrMartin in [place] & was referred to DrSanders for hemorrhage in eye (there was nothing he could do, we don't have any notes to review) & vision is 20:300 per daughter; pt indicated that she had "blackout" but it is unclear what she means- she is demented, HOH, etc; family hasn't witnesses anything but she has stumbled & fell w/ scrape on her elbow, no apparent head injury; she denies HA, dizziness, or recurrent problem; she denies CP, palpit, SOB (she is very sedentary);  Records reveal prev MRI Brain 12/12 showing large remote right hemispheric infarct w/ encephalomalacia & calcif, prom sm vessel dis, ectatic V-B system, global atrophy, cerv spondylosis; CDopplers 1/13 showed mild mixed plaque w/ 40-59% (low end) bilat ICA stenoses, the prev right CAE (1992) & patch angioplasty were patent;  They are very concerned & we discussed the need for additional cardiac eval w/ ?of syncope- CXR, EKG, 2DEcho, Event recorder...  She remains on ASA/ Plavix, Lipitor, Depakote Sprinkle, & not on Aricept/ Namenda etc...     Prob list above is  reviewed & meds remain the same> she may need to stop the ASA due to eye hemorrhage & will check w/ DrSanders on f/u due soon... We reviewed prob list, meds, xrays and labs> see below for updates >>  CXR 8/14 showed mod cardiomeg, clear lungs, modHH, osteopenia, thor  kyphosis, osteophytes, etc... EKG 8/14 showed NSR, rate60, trifasicular block w/ 1st degree AVB/ RBBB/ LAD + we will proceed w/ 2DEcho & Event recorder... LABS 8/14:  FLP- looks good on Lip10;  Chems- ok x mild renal insuffic w/ Cr=1.7;  CBC- anemic w/ Hg=9.9, Fe=61 (21%sat);  TSH=0.81;  B12=570...   ADDENDUM>> 2DEcho 12/11/12 showed mild LVH, normal LVF w/ EF=60-65%, Gr1DD, mildMR, trivAI, mod LAdil, PAsys=46... Continue same meds for now.         Problem List:       ALLERGIC RHINITIS (ICD-477.9) - she uses Claritin & Flonase as needed... she has 2 cats- Heidi & Aline Brochure!  HEARING LOSS (ICD-389.9) - she doesn't have hearing aides- "it wouldn't work"...  DENTAL CARIES (ICD-521.00) - she has been repeatedly reminded to seek dental help, but refuses... family purees her foods...  CARDIAC >> presented 8/14 w/ ?syncope (see above) ~  CXR 8/14 showed mod cardiomeg, clear lungs, modHH, osteopenia, thor kyphosis, osteophytes, etc... ~  EKG 8/14 showed NSR, rate60, trifasicular block w/ 1st degree AVB/ RBBB/ LAD + we will proceed w/ 2DEcho & Event recorder... ~  2DEcho => pending ~  Event recorder => pending  CEREBROVASCULAR DISEASE (ICD-437.9) - s/p right CAE by DrLawson 1992> stable on ASA 81mg /d and TICLID 250mg Bid ever since then,but insurance wouldn't cover Ticlid starting in 2014 therefore switched to ASA81mg /d & PLAVIX 75mg /d... she had been doing well and was asymptomatic- denying focal weakness, numbness, paresthesia, pain, slurred speech, headache, dizziness, tremor, or seizure. ~  12/12: presented w/ some behavior change & family requested further eval:  MRI 12/12 showed large remote right hemispheric infarct w/ encephalomalacia, prominent sm vessel dis, global atrophy, no hemorrhage or mass, sinus mucosal thickening, CSpine spondylosis...  CDopplers 1/13 showed mild mixed plaque w/ 40-59% (low end) bilat ICA stenoses, the prev right CAE & patch angioplasty were patent... ~  1/13:  She saw  DrWong for Neuro & his note is reviewed> he felt some of her recent motor problems were related to her visual changes & there was no evid for new stroke... Continue ASA/ Ticlid. ~  1/14:  She was switched to ASA 81mg /d & PLAVIX 75mg /d due to insurance coverage => stable...  VENOUS INSUFFICIENCY, CHRONIC (ICD-459.81) - she has chronic LE edema- on LASIX 20mg /d now and low sodium diet... she had gained 23# to 152# in 2009 w/ Ensure & puree diet from famiy, now down to 140#... advises on low sodium, elevate legs, support hose... ~  6/11:  BUN=40, Creat=1.6 on the Lasix80 & asked to decr to 40mg /d and restrict sodium. ~  12/11:  labs showed BUN=41, Creat=1.5 on Lasix40... Baseline Creat ~1.4-1.5 ~  10/12:  Labs showed BUN=39, Creat=1.9.Marland KitchenMarland Kitchen rec to decr Lasix further to 20mg  daily... ~  Since then she has gained a few lbs w/ incr edema, Creat stable ~1.6, advised NO SALT, elevation, support hose...  HYPERCHOLESTEROLEMIA (ICD-272.0) - on diet + LIPITOR 10mg /d... her TG's have been elevated but she refuses more meds. ~  labs 3/08 showed TChol 183, TG 205, HDL 54, LDL 98 ~  labs 6/09 showed TChol 190, TG 314, HDL 43, LDL 94... she refuses more meds, rec low fat diet. ~  labs 6/10 showed TChol 184, TG 279, HDL 50, LDL 97 ~  labs 6/11 showed TChol 180, TG 277, HDL 49, LDL 95 ~  FLP 12/11 on Lip10 showed TChol 168. TH 184, HDL50, LDL 81 ~  FLP 10/12 on Lip10 showed TChol 151, TG 240, HDL 44, LDL 75 & rec for better low fat diet. ~  FLP 9/13 on Lip10 showed TChol 147, TG 172, HDL 42, LDL 71  ~  FLP 8/14 on Lip10 showed TChol 133, TG 132, HDL 43, LDL 64  HIATAL HERNIA (ICD-553.3) - large HH seen on prev CT Abd in 2001... takes ZANTAC 75mg  Qhs...  RENAL INSUFFICIENCY (ICD-588.9) - Creat= 1.5 to 1.7 in 2008... ~  labs 6/09 showed BUN= 33, Creat= 1.4 ~  labs 6/10 showed BUN= 36, Creat= 1.5 ~  labs 12/10 showed BUN= 33, Creat= 1.4 ~  labs 6/11 showed BUN=40, Creat=1.6.Marland KitchenMarland Kitchen rec to decr Lasix from 80 to 40mg /d. ~   labs 12/11 showed BUN=41, creat=1.5 ~  Labs 3/12 Hosp showed Creat 2.01 ==> 1.38 ~  Labs 10/12 showed BUN=39, Creat=1.9 & we rec that she decr Lasix to 20mg /d. ~  Labs 12/12 on Lasix20 showed BUN=37, Creat=1.6.Marland KitchenMarland Kitchen BP is wnl, continue same meds. ~  Labs 9/13 on Lasix20 showed BUN=39, Creat=1.8.Marland KitchenMarland Kitchen Continue same... ~  Labs 5/14 on Lasix20 showed BUN=40, Cr=1.6.Marland KitchenMarland Kitchen Continue same ~  Labs 8/14 on Lasix20 showed BUN=39, Creat=1.7  UTI'S, HX OF (ICD-V13.00)  VITAMIN D DEFICIENCY (ICD-268.9) - she is likely osteoporotic but unmeasured due to refusal of BMD & Bisphos therapy options... ~  labs 6/09 showed Vit D level = 26... rec to take OTC Vit D 1000 u daily... ~  labs 6/10 showed Vit D level = 24... rec to incr Vit D to 2000 u daily.  Hx of STROKE (ICD-434.91) - hx right brain stroke in 1992 w/ CTBrain showing right post temporal & parietal low density area w/ encephalomalacia... ~  She remains on ASA 81mg /d & TICLID 250mg Bid w/o acute cerebral ischemic symptoms... ~  12/12: she presented w/ some behavioral changes but no focal neuro deficits; they requested further eval after 67yrs of refusing f/u scans, dopplers, etc... ~  MRI Brain 12/12 showed large remote right hemispheric infarct w/ encephalomalacia, prominent sm vessel dis, global atrophy, no hemorrhage or mass, sinus mucosal thickening, CSpine spondylosis... ~  CDopplers 1/13 showed mild mixed plaque w/ 40-59% (low end) bilat ICA stenoses, the prev right CAE & patch angioplasty were patent... ~  1/13: she saw DrWong for Neuro & his note is reviewed> he felt some of her recent motor problems were related to her visual changes & there was no evid for new stroke... ~  1/14:  Insurance will no longer cover her ticlid & switched to PLAVIX75 + her ASA81 => stable on this...  SENILE DEMENTIA (ICD-290.0) ~  2/13:  Due to behavioral changes and the family's growing frustration we decided to try Depakote 125mg  Bid... ~  5/13:  They report improvement in  behavior back toward baseline, hopefully this will continue, same Rx... ~  5/14:  Daughter notes pt more argumentative, slowly progressive behavioral issues and they are considering NHP due to this & their health issues...  ANEMIA (ICD-285.9) & PERNICIOUS ANEMIA (ICD-281.0) - on monthly B12 shots for years... in 2006 she was also iron deficient and received IV iron therapy (she refused GI eval)... on FeSO4 daily... she feels well and denies weakness, fatigue, bleeding, dark stools, etc... ~  labs 3/08 showed Hg= 13.2 ~  labs 6/09 showed Hg= 12.7 ~  labs 6/10 showed Hg= 11.7... Fe, B12, Folate were all WNL. ~  labs 12/10 showed Hg= 12.4 ~  labs 12/11 showed Hg= 12.2, Fe=106 (34%sat)... ~  Labs 3/12 Hosp showed Hg= 12.2 ==> 10.4 ~  Labs 10/12 showed Hg= 11.3, MCV= 96 ~  Labs 12/12 showed Hg=11.3 w/ MCV=96, Fe=56 (23%sat); Rec to continue same Fe/ VitC, B12 shots... ~  Labs 9/13 showed Hg= 11.1 ~  Labs 5/14 showed Hg= 11.1, MCV=93, Fe= 42 (15%sat)... Continue same. ~  Labs 8/14 showed Hg= 9.9, MCV=93, Fe=61 (21%sat)..  NOTE>> She has repeatedly refused GYN exams, PAP smears, Mammograms, & Colonoscopy...   Past Surgical History  Procedure Laterality Date  . Cholecystectomy    . Carotid endarterectomy      Outpatient Encounter Prescriptions as of 12/02/2012  Medication Sig Dispense Refill  . acetaminophen (TYLENOL) 500 MG tablet Take 2 tablet by mouth at bedtime      . aspirin 81 MG tablet Take 81 mg by mouth daily.        Marland Kitchen atorvastatin (LIPITOR) 10 MG tablet TAKE 1 TABLET EVERY DAY  30 tablet  5  . cetirizine (ZYRTEC) 10 MG tablet Take 10 mg by mouth daily.        . Cholecalciferol (EQL VITAMIN D3) 1000 UNITS tablet Take 1,000 Units by mouth daily.        . clopidogrel (PLAVIX) 75 MG tablet Take 75 mg by mouth daily.      . [facility] RANITIDINE 75 MG tablet TAKE ONE TABLET BY MOUTH AT BEDTIME  30 tablet  9  . Dextromethorphan Polistirex (DELSYM PO) Take by mouth 2 (two) times daily as needed.  Take as directed.      . divalproex (DEPAKOTE SPRINKLE) 125 MG capsule TAKE 1 CAPSULE TWICE DAILY  60 capsule  5  . furosemide (LASIX) 20 MG tablet TAKE 1 TABLET EVERY DAY  30 tablet  11  . iron polysaccharides (NIFEREX) 150 MG capsule Take 150 mg by mouth daily.        . mirabegron ER (MYRBETRIQ) 50 MG TB24 Take 1 tablet (50 mg total) by mouth daily.  30 tablet  11  . Multiple Vitamin (MULTIVITAMIN) capsule Take 1 capsule by mouth daily.        . sodium chloride (OCEAN) 0.65 % nasal spray Place 1 spray into the nose at bedtime.      . vitamin C (ASCORBIC ACID) 500 MG tablet Take 500 mg by mouth daily. To take along with the iron tablet daily       . [DISCONTINUED] atorvastatin (LIPITOR) 10 MG tablet TAKE 1 TABLET EVERY DAY  30 tablet  5  . [DISCONTINUED] clopidogrel (PLAVIX) 75 MG tablet TAKE 1 TABLET EVERY DAY  30 tablet  6   No facility-administered encounter medications on file as of 12/02/2012.    Allergies  Allergen Reactions  . Procaine Hcl     REACTION: pt states reaction to shot at dentist office w/ tachycardia \\T \ SOB (? if really from combination w/epi)    Current Medications, Allergies, Past Medical History, Past Surgical History, Family History, and Social History were reviewed in [employer] record.      Review of Systems       See HPI - all other systems neg except as noted...      The patient complains of decreased hearing, dyspnea on exertion, and difficulty walking.  The patient denies anorexia, fever, weight loss, weight  gain, vision loss, hoarseness, chest pain, syncope, peripheral edema, prolonged cough, headaches, hemoptysis, abdominal pain, melena, hematochezia, severe indigestion/heartburn, hematuria, incontinence, muscle weakness, suspicious skin lesions, transient blindness, depression, unusual weight change, abnormal bleeding, enlarged lymph nodes, and angioedema.     Objective:   Physical Exam     WD WN 79 y/o WF in NAD, very HOH w/o  hearing aides... GENERAL:  Alert & oriented x 2;  pleasant & cooperative... HEENT:  [place]/AT, EOM-wnl, PERRLA, Fundi-benign, EACs-clear, TMs-wnl, NOSE-clear, THROAT-clear but dentition is very poor. NECK:  Supple w/ adeq ROM; no JVD; carotid impulses OK w/o bruits, +CAE scar on Rt; no thyromegaly or nodules palpated; no lymphadenopathy. CHEST:  Clear to P & A; without wheezes/ rales/ or rhonchi. HEART:  Regular Rhythm; without murmurs/ rubs/ or gallops. ABDOMEN:  Soft & nontender; normal bowel sounds; no organomegaly or masses detected. EXT: without deformities or arthritic changes; she has VI changes w/ 2+edema on Lft & 1+edema on Rt NEURO:  CN's intact; ?no focal neuro deficits (DrWong detected a field cut & some left sided weakness). DERM:  No lesions noted; no rash- just the bruises & abrasion from fall.  RADIOLOGY DATA:  Reviewed in the EPIC EMR & discussed w/ the patient...  LABORATORY DATA:  Reviewed in the EPIC EMR & discussed w/ the patient...   Assessment & Plan:    ?SYNCOPE>> the hx is difficult but pt insists and family is concerned & indicated they would pursue pacer if prob identified; we will check 2DEcho, Event recorder...  Cerebrovasc Dis>  On the ASA/ Plavix now due to insurance coverage & stable... DrSanders may want to stop ASA & they will check w/ him...  VI> Edema>  She is reminded to take the Lasix20 every day, avoid salt, elev legs, wear support hose; and incr free water intake...  CHOL>  On Lip10 & f/u FLP showed Chol numbers ok, but needs better low fat diet...  HH/ GERD>  She takes Zantac 150mg /d & doing quite well, no symptoms she says...  Renal Insuffic>  Creat is stable at 1.7 on Lasix20 (improved from prev 1.9 on Lasix40); continue same, low sodium, incr fluid intake...  Hx Stroke & Dementia>  MRI 12/12 shows extensive chronic changes now on ASA/ Plavix as noted; behavioral changes noted are likely a manifestation of her senile dementia> offered Aricept,  Ativan, etc but they are ambivalent & will decide & let me know; we finally decided to try Depakote 125mg  Bid to see if it helps to calm her down... She is now ambulating w/ walker & family helps...  Anemia & B12 Defic>  On monthly B12 shots & Fe daily;  Continue same...   Patient's Medications  New Prescriptions   No medications on file  Previous Medications   ACETAMINOPHEN (TYLENOL) 500 MG TABLET    Take 2 tablet by mouth at bedtime   ASPIRIN 81 MG TABLET    Take 81 mg by mouth daily.     ATORVASTATIN (LIPITOR) 10 MG TABLET    TAKE 1 TABLET EVERY DAY   CETIRIZINE (ZYRTEC) 10 MG TABLET    Take 10 mg by mouth daily.     CHOLECALCIFEROL (EQL VITAMIN D3) 1000 UNITS TABLET    Take 1,000 Units by mouth daily.     CLOPIDOGREL (PLAVIX) 75 MG TABLET    Take 75 mg by mouth daily.   [facility] RANITIDINE 75 MG TABLET    TAKE ONE TABLET BY MOUTH AT BEDTIME   DEXTROMETHORPHAN POLISTIREX (DELSYM  PO)    Take by mouth 2 (two) times daily as needed. Take as directed.   DIVALPROEX (DEPAKOTE SPRINKLE) 125 MG CAPSULE    TAKE 1 CAPSULE TWICE DAILY   FUROSEMIDE (LASIX) 20 MG TABLET    TAKE 1 TABLET EVERY DAY   IRON POLYSACCHARIDES (NIFEREX) 150 MG CAPSULE    Take 150 mg by mouth daily.     MIRABEGRON ER (MYRBETRIQ) 50 MG TB24    Take 1 tablet (50 mg total) by mouth daily.   MULTIPLE VITAMIN (MULTIVITAMIN) CAPSULE    Take 1 capsule by mouth daily.     SODIUM CHLORIDE (OCEAN) 0.65 % NASAL SPRAY    Place 1 spray into the nose at bedtime.   VITAMIN C (ASCORBIC ACID) 500 MG TABLET    Take 500 mg by mouth daily. To take along with the iron tablet daily   Modified Medications   No medications on file  Discontinued Medications   ATORVASTATIN (LIPITOR) 10 MG TABLET    TAKE 1 TABLET EVERY DAY   CLOPIDOGREL (PLAVIX) 75 MG TABLET    TAKE 1 TABLET EVERY DAY

## 2012-12-02 NOTE — Patient Instructions (Addendum)
Today we updated your med list in our EPIC system...    Continue your current medications the same...  For the GAS>>    Try SIMETHACONE- eg. MYLICON, Mylanta-GAS, etc as needed...  Today we did your follow up CXR, EKG, & FASTING blood work...    We will contact you w/ the results when available...   We will be scheduling a 2DEcho & HOLTER MONITOR at the [facility] office in the near future...  Call for any questions...  Let's plan a follow up visit in Oct as planned.Marland KitchenMarland Kitchen

## 2012-12-03 ENCOUNTER — Encounter: Payer: Self-pay | Admitting: Pulmonary Disease

## 2012-12-11 ENCOUNTER — Ambulatory Visit (INDEPENDENT_AMBULATORY_CARE_PROVIDER_SITE_OTHER): Payer: Medicare Other

## 2012-12-11 ENCOUNTER — Ambulatory Visit (HOSPITAL_COMMUNITY): Payer: Medicare Other | Attending: Pulmonary Disease

## 2012-12-11 DIAGNOSIS — E785 Hyperlipidemia, unspecified: Secondary | ICD-10-CM | POA: Insufficient documentation

## 2012-12-11 DIAGNOSIS — I517 Cardiomegaly: Secondary | ICD-10-CM | POA: Insufficient documentation

## 2012-12-11 DIAGNOSIS — M7989 Other specified soft tissue disorders: Secondary | ICD-10-CM | POA: Insufficient documentation

## 2012-12-11 DIAGNOSIS — R55 Syncope and collapse: Secondary | ICD-10-CM

## 2012-12-11 DIAGNOSIS — F0390 Unspecified dementia without behavioral disturbance: Secondary | ICD-10-CM | POA: Insufficient documentation

## 2012-12-11 DIAGNOSIS — Z8673 Personal history of transient ischemic attack (TIA), and cerebral infarction without residual deficits: Secondary | ICD-10-CM | POA: Insufficient documentation

## 2012-12-11 DIAGNOSIS — D649 Anemia, unspecified: Secondary | ICD-10-CM

## 2012-12-11 NOTE — Progress Notes (Signed)
Echocardiogram performed.  

## 2012-12-22 MED ORDER — CYANOCOBALAMIN 1000 MCG/ML IJ SOLN
1000.0000 ug | Freq: Once | INTRAMUSCULAR | Status: AC
  Administered 2012-12-22: 1000 ug via INTRAMUSCULAR

## 2013-01-14 ENCOUNTER — Ambulatory Visit (INDEPENDENT_AMBULATORY_CARE_PROVIDER_SITE_OTHER): Payer: Medicare Other

## 2013-01-14 DIAGNOSIS — D510 Vitamin B12 deficiency anemia due to intrinsic factor deficiency: Secondary | ICD-10-CM

## 2013-01-14 MED ORDER — CYANOCOBALAMIN 1000 MCG/ML IJ SOLN
1000.0000 ug | Freq: Once | INTRAMUSCULAR | Status: AC
  Administered 2013-01-14: 1000 ug via INTRAMUSCULAR

## 2013-02-09 ENCOUNTER — Ambulatory Visit: Payer: Medicare Other | Admitting: Pulmonary Disease

## 2013-02-13 ENCOUNTER — Other Ambulatory Visit: Payer: Self-pay | Admitting: Pulmonary Disease

## 2013-02-15 ENCOUNTER — Telehealth: Payer: Self-pay | Admitting: Pulmonary Disease

## 2013-02-15 NOTE — Telephone Encounter (Signed)
I spoke with daughter. Per Leigh she can get this at the same time. The daughter needed nothing further

## 2013-02-16 ENCOUNTER — Ambulatory Visit (INDEPENDENT_AMBULATORY_CARE_PROVIDER_SITE_OTHER): Payer: Medicare Other

## 2013-02-16 DIAGNOSIS — D510 Vitamin B12 deficiency anemia due to intrinsic factor deficiency: Secondary | ICD-10-CM

## 2013-02-16 DIAGNOSIS — Z23 Encounter for immunization: Secondary | ICD-10-CM

## 2013-02-17 MED ORDER — CYANOCOBALAMIN 1000 MCG/ML IJ SOLN
1000.0000 ug | Freq: Once | INTRAMUSCULAR | Status: AC
  Administered 2013-02-17: 1000 ug via INTRAMUSCULAR

## 2013-02-17 NOTE — Addendum Note (Signed)
Addended by: Horatio Pel on: 02/17/2013 02:27 PM   Modules accepted: Orders

## 2013-02-19 ENCOUNTER — Other Ambulatory Visit: Payer: Self-pay | Admitting: Pulmonary Disease

## 2013-03-17 ENCOUNTER — Ambulatory Visit: Payer: Medicare Other

## 2013-03-24 ENCOUNTER — Ambulatory Visit (INDEPENDENT_AMBULATORY_CARE_PROVIDER_SITE_OTHER): Payer: Medicare Other

## 2013-03-24 DIAGNOSIS — D510 Vitamin B12 deficiency anemia due to intrinsic factor deficiency: Secondary | ICD-10-CM

## 2013-03-24 MED ORDER — CYANOCOBALAMIN 1000 MCG/ML IJ SOLN
1000.0000 ug | Freq: Once | INTRAMUSCULAR | Status: AC
  Administered 2013-03-24: 1000 ug via INTRAMUSCULAR

## 2013-03-31 ENCOUNTER — Encounter: Payer: Self-pay | Admitting: Pulmonary Disease

## 2013-03-31 ENCOUNTER — Ambulatory Visit (INDEPENDENT_AMBULATORY_CARE_PROVIDER_SITE_OTHER): Payer: Medicare Other | Admitting: Pulmonary Disease

## 2013-03-31 VITALS — BP 130/72 | HR 63 | Temp 97.8°F | Ht 64.0 in | Wt 151.6 lb

## 2013-03-31 DIAGNOSIS — E559 Vitamin D deficiency, unspecified: Secondary | ICD-10-CM

## 2013-03-31 DIAGNOSIS — K449 Diaphragmatic hernia without obstruction or gangrene: Secondary | ICD-10-CM

## 2013-03-31 DIAGNOSIS — F0391 Unspecified dementia with behavioral disturbance: Secondary | ICD-10-CM

## 2013-03-31 DIAGNOSIS — R609 Edema, unspecified: Secondary | ICD-10-CM

## 2013-03-31 DIAGNOSIS — F0390 Unspecified dementia without behavioral disturbance: Secondary | ICD-10-CM

## 2013-03-31 DIAGNOSIS — R32 Unspecified urinary incontinence: Secondary | ICD-10-CM

## 2013-03-31 DIAGNOSIS — D649 Anemia, unspecified: Secondary | ICD-10-CM

## 2013-03-31 DIAGNOSIS — D510 Vitamin B12 deficiency anemia due to intrinsic factor deficiency: Secondary | ICD-10-CM

## 2013-03-31 DIAGNOSIS — I679 Cerebrovascular disease, unspecified: Secondary | ICD-10-CM

## 2013-03-31 DIAGNOSIS — F03918 Unspecified dementia, unspecified severity, with other behavioral disturbance: Secondary | ICD-10-CM

## 2013-03-31 DIAGNOSIS — I872 Venous insufficiency (chronic) (peripheral): Secondary | ICD-10-CM

## 2013-03-31 DIAGNOSIS — N259 Disorder resulting from impaired renal tubular function, unspecified: Secondary | ICD-10-CM

## 2013-03-31 DIAGNOSIS — I6350 Cerebral infarction due to unspecified occlusion or stenosis of unspecified cerebral artery: Secondary | ICD-10-CM

## 2013-03-31 DIAGNOSIS — E7800 Pure hypercholesterolemia, unspecified: Secondary | ICD-10-CM

## 2013-03-31 NOTE — Patient Instructions (Signed)
Today we updated your med list in our EPIC system...    Continue your current medications the same...  Call for any questions...  Let's plan a follow up visit in 58mo w/ FASTING blood work around that time.Marland KitchenMarland Kitchen

## 2013-03-31 NOTE — Progress Notes (Signed)
Subjective:    Patient ID: Carrie Grimes, female    DOB: [birth year], 79 y.o.   MRN: [id number]  HPI 79 y/o WF here for a follow up visit... she has mult med problems including cerebrovasc dis & stroke w/ prev right CAE in 1992;  chr ven insuffic w/ edema;  Hyperchol;  large HH;  mild renal insuffic &  hx UTIs;  DJD & Vit D defic;  senile dementia;  anemia...  ~  January 06, 2012:  17mo ROV & family notes sl more confused> she can remember from long ago but not recent events; she wears depends for Uincont & they note increased #of pads recently- we will check UA and C&S to be sure no UTI; Depakote has really helped her behavioral issues but they have a hard time taking the eve dose; she is drinking Ensure to help her nutrition; grandson getting married this weekend- reminded to get in that 2nd dose of Seroquel; OK B12 shot & Flu shot today...    Daughter shared an upcoming dilemma> she has renal dis from DM w/ Cr=6 & facing dialysis; mother really can't be left alone & they are trying to work out future plans.    We received report of eye care from [place] 6/13> bilat cats, & branch retinal vein occlusion right eye; they continue to monitor the situation...    Weight is up 4# to 141# today; BP=134/66 w/ 2+edema L>R on LASIX20 & low sodium> will recheck labs & see if Lasix can be incr(Cr in 2012 ranged 1.4=>1.9). We reviewed prob list, meds, xrays and labs> see below for updates >> LABS 9/13:  FLP- at goals on Lip10 x TG=172;  Chems- ok x BUN=39 Creat=1.8;  CBC- ok x Hg=11.1;  TSH=2.30;  UA looks clear but C&S pos for pan-sens EColi... NOTE> UTI Rx w/ Cipro;  We can't incr the Lasix, therefore rec better low sodium diet & support hose...  ~  May 05, 2012:  59mo ROV & family notes incr incont & use of depends- we discussed Kaegel exercises for better continence & offered trial Mybetriq but they don't want new meds;  They report Dx w/ Glaucoma from eye doc on gtts now...     AR,  HOH, Dental caries> we reviewed OTC meds & advised hearing eval, hearing aides, & dental work...    Cerebrovasc dis, Hx Stroke>  On ASA & Ticlid; she has been stable w/o cerebral ischemic symptoms on these meds; but they called 12/13 stating insurance won't pay for Ticlid & Plavix is just $8 on their plan therefore switched to ASA/ Plavix...    VI/ Edema>  On low sodium diet & Lasix20; wt is stable at 142# w/ VI changes and trace-1+ edema in LEs...    Chol>  On Lip10; last FLP 9/13 showed TChol 147, TG 172, HDL 42, LDL 71; continue same med & better low fat diet!    HH>  On Antireflux regimen & Ranitadine; she has large HH seen on prev CT scan; denies CP, Abd pain, N/ V, dysphagia etc...    Renal Insuffic>  Baseline Creat= 1.4-1.5; labs 2012 showed Creat up to1.9 & we decr Lasix40 to 1/2 tab daily & incr po fluids.    Vit D Defic>  On MVI + VitD 2000u daily; encouraged to take the vit D every day...    Senile dementia>  She is pleasantly demented & cared for beautifully by her family...    Hx Anemia>  On  MVI + Niferex; labs 9/13 showed Hg= 11.1, MCV=96; rec to continue her Fe, VitC & supplements...    Vit B12 Defic>  On B12 shots monthly... We reviewed prob list, meds, xrays and labs> see below for updates >> she had the 2013 flu vaccine 9/13... Given Pneumonia & TDAP updates in 2010.  ~  Sep 09, 2012:  8mo [place] notes some incr difficulty dealing w/ pt- argumentative, not cooperating, etc; [place] 7 her husb have health issues and they are contemplating the eventuality of putting MrsChildress in NH... We reviewed the following medical problems during today's office visit >>     AR, HOH, Dental caries> we reviewed OTC meds & advised hearing eval, hearing aides, & dental work (she refuses dentist); states they've been told by ENT that hearing aides wouldn't help...    Cerebrovasc dis, Hx Stroke>  On [id number] & Plavix75; she has been stable w/o cerebral ischemic symptoms; s/p RCAE 1992; prev MRI w/  large infarct on right w/ encephalomalacia, sm vessel dis, atrophy...    VI/ Edema>  On low sodium diet & Lasix20; wt is up 4# at 146# w/ VI changes and 1-2+ edema in LEs; she knows to elim salt, elev legs, wear support hose...    Chol>  On Lip10; last FLP 9/13 showed TChol 147, TG 172, HDL 42, LDL 71; continue same med & better low fat diet!    HH>  On Antireflux regimen & Ranitadine75Qhs; she has large HH seen on prev CT scan; denies CP, abd pain, N/ V, dysphagia etc...    Renal Insuffic>  Creat= 1.4-1.9 on Lasix20 w/ mod edema; Labs 5/14 showed BUN=40, Cr= 1.6 (stable), therefore continue the same for now...    Urinary Incont> she wears Depends (daugh noted 5/14 that it costs>$100/mo); we decided to try Myrbetriq50/d to see if this helps...    Vit D Defic>  On MVI + VitD 2000u daily; encouraged to take the Vit D every day...    Senile dementia>  She was pleasantly demented & cared for beautifully by her family- starked having some behavioral issues & DrWong added Depakote Sprinkle 125Bid...     Hx Anemia>  On MVI + Niferex150/d; Labs 5/14 showed Hg=11.1, MCV=93, & Fe=42 (15%sat)- rec to continue her Fe, VitC & supplements...    Vit B12 Defic>  On B12 shots monthly... We reviewed prob list, meds, xrays and labs> see below for updates >> they did not bring med list or bottles to the OV today... LABS 5/14:  Chems- ok x BUN=40, Cr=1.6 (stable);  CBC- ok x Hg=11.1 & Fe=42 (15%sat);  TSH=2.01     ~  December 02, 2012:  30mo ROV & add-on per family request for ?syncope> pt/family relate a convoluted hx- she saw her eye doc DrMartin in [place] & was referred to DrSanders for hemorrhage in eye (there was nothing he could do, we don't have any notes to review) & vision is 20:300 per daughter; pt indicated that she had "blackout" but it is unclear what she means- she is demented, HOH, etc; family hasn't witnesses anything but she has stumbled & fell w/ scrape on her elbow, no apparent head injury; she denies HA,  dizziness, or recurrent problem; she denies CP, palpit, SOB (she is very sedentary);  Records reveal prev MRI Brain 12/12 showing large remote right hemispheric infarct w/ encephalomalacia & calcif, prom sm vessel dis, ectatic V-B system, global atrophy, cerv spondylosis; CDopplers 1/13 showed mild mixed plaque w/ 40-59% (low  end) bilat ICA stenoses, the prev right CAE (1992) & patch angioplasty were patent;  They are very concerned & we discussed the need for additional cardiac eval w/ ?of syncope- CXR, EKG, 2DEcho, Event recorder...  She remains on ASA/ Plavix, Lipitor, Depakote Sprinkle, & not on Aricept/ Namenda etc...     Prob list above is reviewed & meds remain the same> she may need to stop the ASA due to eye hemorrhage & will check w/ DrSanders on f/u due soon... We reviewed prob list, meds, xrays and labs> see below for updates >>  CXR 8/14 showed mod cardiomeg, clear lungs, modHH, osteopenia, thor kyphosis, osteophytes, etc... EKG 8/14 showed NSR, rate60, trifasicular block w/ 1st degree AVB/ RBBB/ LAD + we will proceed w/ 2DEcho & Event recorder... LABS 8/14:  FLP- looks good on Lip10;  Chems- ok x mild renal insuffic w/ Cr=1.7;  CBC- anemic w/ Hg=9.9, Fe=61 (21%sat);  TSH=0.81;  B12=570... ADDENDUM>> 2DEcho 12/11/12 showed mild LVH, normal LVF w/ EF=60-65%, Gr1DD, mildMR, trivAI, mod LAdil, PAsys=46... Continue same meds for now.  ~  March 31, 2013:  34mo ROV & Caelie says "no problems- I feel good for the shape I'm in"; she had laser eye surg by DrSandersfor a hemorrhage behind her right eye w/ vision decr to 20/300 or so, now off ASA;  We reviewed the following medical problems during today's office visit >>     AR, HOH, Dental caries> we reviewed OTC meds & advised hearing eval, hearing aides, & dental work (she refuses dentist); states they've been told by ENT that hearing aides wouldn't help...    Cerebrovasc dis, Hx Stroke>  On Plavix75 (off prev ASA w/ eye hemorrhage); she has been  stable w/o cerebral ischemic symptoms; s/p RCAE 1992; prev MRI w/ large infarct on right w/ encephalomalacia, sm vessel dis, atrophy...    VI/ Edema>  On low sodium diet & Lasix20; wt is unchanged 152# w/ VI changes and 1-2+ edema in LEs; she knows to elim salt, elev legs, wear support hose...    Chol>  On Lip10; last FLP 8/14 showed TChol 133, TG 132, HDL 43, LDL 64; continue same med & better low fat diet!    HH>  On Antireflux regimen & Ranitadine75Qhs; she has large HH seen on prev CT scan; denies CP, abd pain, N/ V, dysphagia etc...    Renal Insuffic>  Creat= 1.4-1.9 on Lasix20 w/ mod edema; Labs 8/14 showed BUN=39, Cr= 1.7 (stable), therefore continue the same for now...    Urinary Incont> she wears Depends (daugh noted 5/14 that it costs>$100/mo); Myrbetriq50/d seems to help...    Vit D Defic>  On MVI + VitD 2000u daily; encouraged to take the Vit D every day...    Senile dementia>  She was pleasantly demented & cared for beautifully by her family- starked having some behavioral issues & DrWong added Depakote Sprinkle 125Bid...     Hx Anemia>  On MVI + Niferex150/d; Labs 5/14 showed Hg=11.1, MCV=93, & Fe=42 (15%sat)- rec to continue her Fe, VitC & supplements...    Vit B12 Defic>  On B12 shots monthly... We reviewed prob list, meds, xrays and labs> see below for updates >> she had the 2014 flu vaccine 10/14...           Problem List:       ALLERGIC RHINITIS (ICD-477.9) - she uses Claritin & Flonase as needed... she has 2 cats- Heidi & Aline Brochure!  HEARING LOSS (ICD-389.9) - she doesn't have hearing  aides- "it wouldn't work"...  DENTAL CARIES (ICD-521.00) - she has been repeatedly reminded to seek dental help, but refuses... family purees her foods...  CARDIAC >> presented 8/14 w/ ?syncope (see above) ~  CXR 8/14 showed mod cardiomeg, clear lungs, modHH, osteopenia, thor kyphosis, osteophytes, etc... ~  EKG 8/14 showed NSR, rate60, trifasicular block w/ 1st degree AVB/ RBBB/ LAD + we will  proceed w/ 2DEcho & Event recorder... ~  2DEcho => mild LVH, norm LVF w/ EF=60-65%, Gr1DD, mild MR, mod LA dil, PAsys=46...  ~  Event recorder => neg, no signif arrhythmias...  CEREBROVASCULAR DISEASE (ICD-437.9) - s/p right CAE by DrLawson 1992> stable on ASA 81mg /d and TICLID 250mg Bid ever since then,but insurance wouldn't cover Ticlid starting in 2014 therefore switched to ASA81mg /d & PLAVIX 75mg /d... she had been doing well and was asymptomatic- denying focal weakness, numbness, paresthesia, pain, slurred speech, headache, dizziness, tremor, or seizure. ~  12/12: presented w/ some behavior change & family requested further eval:  MRI 12/12 showed large remote right hemispheric infarct w/ encephalomalacia, prominent sm vessel dis, global atrophy, no hemorrhage or mass, sinus mucosal thickening, CSpine spondylosis...  CDopplers 1/13 showed mild mixed plaque w/ 40-59% (low end) bilat ICA stenoses, the prev right CAE & patch angioplasty were patent... ~  1/13:  She saw DrWong for Neuro & his note is reviewed> he felt some of her recent motor problems were related to her visual changes & there was no evid for new stroke... Continue ASA/ Ticlid. ~  1/14:  She was switched to ASA 81mg /d & PLAVIX 75mg /d due to insurance coverage => stable... ~  8/14:  The ASA was discontinued by DrSanders, Ophthal due to eye hemorrhage w/ visual loss on right eye...  VENOUS INSUFFICIENCY, CHRONIC (ICD-459.81) - she has chronic LE edema- on LASIX 20mg /d now and low sodium diet... she had gained 23# to 152# in 2009 w/ Ensure & puree diet from famiy, now down to 140#... advises on low sodium, elevate legs, support hose... ~  6/11:  BUN=40, Creat=1.6 on the Lasix80 & asked to decr to 40mg /d and restrict sodium. ~  12/11:  labs showed BUN=41, Creat=1.5 on Lasix40... Baseline Creat ~1.4-1.5 ~  10/12:  Labs showed BUN=39, Creat=1.9.Marland KitchenMarland Kitchen rec to decr Lasix further to 20mg  daily... ~  Since then she has gained a few lbs w/ incr edema,  Creat stable ~1.6, advised NO SALT, elevation, support hose...  HYPERCHOLESTEROLEMIA (ICD-272.0) - on diet + LIPITOR 10mg /d... her TG's have been elevated but she refuses more meds. ~  labs 3/08 showed TChol 183, TG 205, HDL 54, LDL 98 ~  labs 6/09 showed TChol 190, TG 314, HDL 43, LDL 94... she refuses more meds, rec low fat diet. ~  labs 6/10 showed TChol 184, TG 279, HDL 50, LDL 97 ~  labs 6/11 showed TChol 180, TG 277, HDL 49, LDL 95 ~  FLP 12/11 on Lip10 showed TChol 168. TH 184, HDL50, LDL 81 ~  FLP 10/12 on Lip10 showed TChol 151, TG 240, HDL 44, LDL 75 & rec for better low fat diet. ~  FLP 9/13 on Lip10 showed TChol 147, TG 172, HDL 42, LDL 71  ~  FLP 8/14 on Lip10 showed TChol 133, TG 132, HDL 43, LDL 64  HIATAL HERNIA (ICD-553.3) - large HH seen on prev CT Abd in 2001... takes ZANTAC 75mg  Qhs...  RENAL INSUFFICIENCY (ICD-588.9) - Creat= 1.5 to 1.7 in 2008... ~  labs 6/09 showed BUN= 33, Creat= 1.4 ~  labs  6/10 showed BUN= 36, Creat= 1.5 ~  labs 12/10 showed BUN= 33, Creat= 1.4 ~  labs 6/11 showed BUN=40, Creat=1.6.Marland KitchenMarland Kitchen rec to decr Lasix from 80 to 40mg /d. ~  labs 12/11 showed BUN=41, creat=1.5 ~  Labs 3/12 Hosp showed Creat 2.01 ==> 1.38 ~  Labs 10/12 showed BUN=39, Creat=1.9 & we rec that she decr Lasix to 20mg /d. ~  Labs 12/12 on Lasix20 showed BUN=37, Creat=1.6.Marland KitchenMarland Kitchen BP is wnl, continue same meds. ~  Labs 9/13 on Lasix20 showed BUN=39, Creat=1.8.Marland KitchenMarland Kitchen Continue same... ~  Labs 5/14 on Lasix20 showed BUN=40, Cr=1.6.Marland KitchenMarland Kitchen Continue same ~  Labs 8/14 on Lasix20 showed BUN=39, Creat=1.7  UTI'S, HX OF (ICD-V13.00)  VITAMIN D DEFICIENCY (ICD-268.9) - she is likely osteoporotic but unmeasured due to refusal of BMD & Bisphos therapy options... ~  labs 6/09 showed Vit D level = 26... rec to take OTC Vit D 1000 u daily... ~  labs 6/10 showed Vit D level = 24... rec to incr Vit D to 2000 u daily.  Hx of STROKE (ICD-434.91) - hx right brain stroke in 1992 w/ CTBrain showing right post temporal &  parietal low density area w/ encephalomalacia... ~  She remains on ASA 81mg /d & TICLID 250mg Bid w/o acute cerebral ischemic symptoms... ~  12/12: she presented w/ some behavioral changes but no focal neuro deficits; they requested further eval after 18yrs of refusing f/u scans, dopplers, etc... ~  MRI Brain 12/12 showed large remote right hemispheric infarct w/ encephalomalacia, prominent sm vessel dis, global atrophy, no hemorrhage or mass, sinus mucosal thickening, CSpine spondylosis... ~  CDopplers 1/13 showed mild mixed plaque w/ 40-59% (low end) bilat ICA stenoses, the prev right CAE & patch angioplasty were patent... ~  1/13: she saw DrWong for Neuro & his note is reviewed> he felt some of her recent motor problems were related to her visual changes & there was no evid for new stroke... ~  1/14:  Insurance will no longer cover her ticlid & switched to PLAVIX75 + her ASA81 => stable on this...  SENILE DEMENTIA (ICD-290.0) ~  2/13:  Due to behavioral changes and the family's growing frustration we decided to try Depakote 125mg  Bid... ~  5/13:  They report improvement in behavior back toward baseline, hopefully this will continue, same Rx... ~  5/14:  Daughter notes pt more argumentative, slowly progressive behavioral issues and they are considering NHP due to this & their health issues...  ANEMIA (ICD-285.9) & PERNICIOUS ANEMIA (ICD-281.0) - on monthly B12 shots for years... in 2006 she was also iron deficient and received IV iron therapy (she refused GI eval)... on FeSO4 daily... she feels well and denies weakness, fatigue, bleeding, dark stools, etc... ~  labs 3/08 showed Hg= 13.2 ~  labs 6/09 showed Hg= 12.7 ~  labs 6/10 showed Hg= 11.7... Fe, B12, Folate were all WNL. ~  labs 12/10 showed Hg= 12.4 ~  labs 12/11 showed Hg= 12.2, Fe=106 (34%sat)... ~  Labs 3/12 Hosp showed Hg= 12.2 ==> 10.4 ~  Labs 10/12 showed Hg= 11.3, MCV= 96 ~  Labs 12/12 showed Hg=11.3 w/ MCV=96, Fe=56 (23%sat); Rec  to continue same Fe/ VitC, B12 shots... ~  Labs 9/13 showed Hg= 11.1 ~  Labs 5/14 showed Hg= 11.1, MCV=93, Fe= 42 (15%sat)... Continue same. ~  Labs 8/14 showed Hg= 9.9, MCV=93, Fe=61 (21%sat)..  NOTE>> She has repeatedly refused GYN exams, PAP smears, Mammograms, & Colonoscopy...   Past Surgical History  Procedure Laterality Date  . Cholecystectomy    .  Carotid endarterectomy    . Laser surgery on right eye  12/02/2012    Dr. Baird Cancer at [facility]    Outpatient Encounter Prescriptions as of 03/31/2013  Medication Sig  . acetaminophen (TYLENOL) 500 MG tablet Take 2 tablet by mouth at bedtime  . aspirin 81 MG tablet Take 81 mg by mouth daily.    Marland Kitchen atorvastatin (LIPITOR) 10 MG tablet TAKE 1 TABLET EVERY DAY  . cetirizine (ZYRTEC) 10 MG tablet Take 10 mg by mouth daily.    . Cholecalciferol (EQL VITAMIN D3) 1000 UNITS tablet Take 1,000 Units by mouth daily.    . clopidogrel (PLAVIX) 75 MG tablet Take 75 mg by mouth daily.  . [facility] RANITIDINE 75 MG tablet TAKE ONE TABLET BY MOUTH AT BEDTIME  . Dextromethorphan Polistirex (DELSYM PO) Take by mouth 2 (two) times daily as needed. Take as directed.  . divalproex (DEPAKOTE SPRINKLE) 125 MG capsule TAKE 1 CAPSULE TWICE DAILY  . divalproex (DEPAKOTE SPRINKLE) 125 MG capsule TAKE 1 CAPSULE TWICE DAILY  . furosemide (LASIX) 20 MG tablet TAKE 1 TABLET EVERY DAY  . iron polysaccharides (NIFEREX) 150 MG capsule Take 150 mg by mouth daily.    . mirabegron ER (MYRBETRIQ) 50 MG TB24 Take 1 tablet (50 mg total) by mouth daily.  . Multiple Vitamin (MULTIVITAMIN) capsule Take 1 capsule by mouth daily.    . sodium chloride (OCEAN) 0.65 % nasal spray Place 1 spray into the nose at bedtime.  . vitamin C (ASCORBIC ACID) 500 MG tablet Take 500 mg by mouth daily. To take along with the iron tablet daily     Allergies  Allergen Reactions  . Procaine Hcl     REACTION: pt states reaction to shot at dentist office w/ tachycardia \\T \ SOB (? if really from  combination w/epi)    Current Medications, Allergies, Past Medical History, Past Surgical History, Family History, and Social History were reviewed in [employer] record.      Review of Systems       See HPI - all other systems neg except as noted...      The patient complains of decreased hearing, dyspnea on exertion, and difficulty walking.  The patient denies anorexia, fever, weight loss, weight gain, vision loss, hoarseness, chest pain, syncope, peripheral edema, prolonged cough, headaches, hemoptysis, abdominal pain, melena, hematochezia, severe indigestion/heartburn, hematuria, incontinence, muscle weakness, suspicious skin lesions, transient blindness, depression, unusual weight change, abnormal bleeding, enlarged lymph nodes, and angioedema.     Objective:   Physical Exam     WD WN 79 y/o WF in NAD, very HOH w/o hearing aides... GENERAL:  Alert & oriented x 2;  pleasant & cooperative... HEENT:  [place]/AT, EOM-wnl, PERRLA, Fundi-benign, EACs-clear, TMs-wnl, NOSE-clear, THROAT-clear but dentition is very poor. NECK:  Supple w/ adeq ROM; no JVD; carotid impulses OK w/o bruits, +CAE scar on Rt; no thyromegaly or nodules palpated; no lymphadenopathy. CHEST:  Clear to P & A; without wheezes/ rales/ or rhonchi. HEART:  Regular Rhythm; without murmurs/ rubs/ or gallops. ABDOMEN:  Soft & nontender; normal bowel sounds; no organomegaly or masses detected. EXT: without deformities or arthritic changes; she has VI changes w/ 2+edema on Lft & 1+edema on Rt NEURO:  CN's intact; ?no focal neuro deficits (DrWong detected a field cut & some left sided weakness). DERM:  No lesions noted; no rash- just the bruises & abrasion from fall.  RADIOLOGY DATA:  Reviewed in the EPIC EMR & discussed w/ the patient.Marland KitchenMarland Kitchen  LABORATORY DATA:  Reviewed in the EPIC EMR & discussed w/ the patient...   Assessment & Plan:    Hx ?SYNCOPE>> the hx is difficult but pt insists and family is concerned  & indicated they would pursue pacer if prob identified; 2DEcho, & Event recorder were done (see above)...  Cerebrovasc Dis>  On the Plavix now due to insurance coverage & stable... DrSanders stopped ASA w/ eye hemorrhage...  VI> Edema>  She is reminded to take the Lasix20 every day, avoid salt, elev legs, wear support hose; and incr free water intake...  CHOL>  On Lip10 & f/u FLP showed Chol numbers ok, but needs better low fat diet...  HH/ GERD>  She takes Zantac 150mg /d & doing quite well, no symptoms she says...  Renal Insuffic>  Creat is stable at 1.7 on Lasix20 (improved from prev 1.9 on Lasix40); continue same, low sodium, incr fluid intake...  Hx Stroke & Dementia>  MRI 12/12 shows extensive chronic changes now on ASA/ Plavix as noted; behavioral changes noted are likely a manifestation of her senile dementia> offered Aricept, Ativan, etc but they are ambivalent & will decide & let me know; we finally decided to try Depakote 125mg  Bid to see if it helps to calm her down... She is now ambulating w/ walker & family helps...  Anemia & B12 Defic>  On monthly B12 shots & Fe daily;  Continue same...   Patient's Medications  New Prescriptions   No medications on file  Previous Medications   ACETAMINOPHEN (TYLENOL) 500 MG TABLET    Take 2 tablet by mouth at bedtime   ATORVASTATIN (LIPITOR) 10 MG TABLET    TAKE 1 TABLET EVERY DAY   CETIRIZINE (ZYRTEC) 10 MG TABLET    Take 10 mg by mouth daily.     CHOLECALCIFEROL (EQL VITAMIN D3) 1000 UNITS TABLET    Take 1,000 Units by mouth daily.     CLOPIDOGREL (PLAVIX) 75 MG TABLET    Take 75 mg by mouth daily.   [facility] RANITIDINE 75 MG TABLET    TAKE ONE TABLET BY MOUTH AT BEDTIME   DEXTROMETHORPHAN POLISTIREX (DELSYM PO)    Take by mouth 2 (two) times daily as needed. Take as directed.   DIVALPROEX (DEPAKOTE SPRINKLE) 125 MG CAPSULE    TAKE 1 CAPSULE TWICE DAILY   FUROSEMIDE (LASIX) 20 MG TABLET    TAKE 1 TABLET EVERY DAY   IRON POLYSACCHARIDES  (NIFEREX) 150 MG CAPSULE    Take 150 mg by mouth daily.     MIRABEGRON ER (MYRBETRIQ) 50 MG TB24    Take 1 tablet (50 mg total) by mouth daily.   MULTIPLE VITAMIN (MULTIVITAMIN) CAPSULE    Take 1 capsule by mouth daily.     SODIUM CHLORIDE (OCEAN) 0.65 % NASAL SPRAY    Place 1 spray into the nose at bedtime.   VITAMIN C (ASCORBIC ACID) 500 MG TABLET    Take 500 mg by mouth daily. To take along with the iron tablet daily   Modified Medications   No medications on file  Discontinued Medications   ASPIRIN 81 MG TABLET    Take 81 mg by mouth daily.     DIVALPROEX (DEPAKOTE SPRINKLE) 125 MG CAPSULE    TAKE 1 CAPSULE TWICE DAILY

## 2013-04-04 ENCOUNTER — Encounter: Payer: Self-pay | Admitting: Pulmonary Disease

## 2013-04-15 ENCOUNTER — Ambulatory Visit (INDEPENDENT_AMBULATORY_CARE_PROVIDER_SITE_OTHER): Payer: Medicare Other

## 2013-04-15 DIAGNOSIS — D510 Vitamin B12 deficiency anemia due to intrinsic factor deficiency: Secondary | ICD-10-CM

## 2013-04-15 MED ORDER — CYANOCOBALAMIN 1000 MCG/ML IJ SOLN
1000.0000 ug | Freq: Once | INTRAMUSCULAR | Status: AC
  Administered 2013-04-15: 1000 ug via INTRAMUSCULAR

## 2013-05-07 ENCOUNTER — Telehealth: Payer: Self-pay | Admitting: Pulmonary Disease

## 2013-05-07 MED ORDER — OXYBUTYNIN CHLORIDE 5 MG PO TABS: 5.0000 mg | ORAL_TABLET | Freq: Two times a day (BID) | ORAL | Status: DC

## 2013-05-07 NOTE — Telephone Encounter (Signed)
Pt take mybertriq 50 mg QD. Per daughter pt will have to pay $230 for the first refill. After that it will cost $45/month. Wants to know if there is an alternative to this. If nothing is similar that would be equal amount then they will get this for pt. [facility] not know the [facility]. Dr. Lenna Gilford please advise alternative to mybertriq. Thanks  Allergies  Allergen Reactions  . Procaine Hcl     REACTION: pt states reaction to shot at dentist office w/ tachycardia \\T \ SOB (? if really from combination w/epi)

## 2013-05-07 NOTE — Telephone Encounter (Signed)
Per SN---  myrbetriq is the best med to take (no generic aval)  The only other generic to try is oxybutynin 5 mg  1 po bid  (not as good as the myrbetriq)    pts daughter is aware and she would like for the pt to try the generic medication.  This has been sent to the pts pharmacy and nothing further is needed.

## 2013-05-13 ENCOUNTER — Ambulatory Visit (INDEPENDENT_AMBULATORY_CARE_PROVIDER_SITE_OTHER): Payer: Medicare Other

## 2013-05-13 DIAGNOSIS — D510 Vitamin B12 deficiency anemia due to intrinsic factor deficiency: Secondary | ICD-10-CM

## 2013-05-14 MED ORDER — CYANOCOBALAMIN 1000 MCG/ML IJ SOLN
1000.0000 ug | Freq: Once | INTRAMUSCULAR | Status: AC
  Administered 2013-05-14: 1000 ug via INTRAMUSCULAR

## 2013-06-10 ENCOUNTER — Ambulatory Visit (INDEPENDENT_AMBULATORY_CARE_PROVIDER_SITE_OTHER): Payer: Medicare Other

## 2013-06-10 DIAGNOSIS — D510 Vitamin B12 deficiency anemia due to intrinsic factor deficiency: Secondary | ICD-10-CM

## 2013-06-10 MED ORDER — CYANOCOBALAMIN 1000 MCG/ML IJ SOLN
1000.0000 ug | Freq: Once | INTRAMUSCULAR | Status: AC
  Administered 2013-06-10: 1000 ug via INTRAMUSCULAR

## 2013-07-03 ENCOUNTER — Other Ambulatory Visit: Payer: Self-pay | Admitting: Pulmonary Disease

## 2013-07-06 ENCOUNTER — Ambulatory Visit: Payer: Medicare Other

## 2013-07-07 ENCOUNTER — Other Ambulatory Visit: Payer: Self-pay | Admitting: Pulmonary Disease

## 2013-07-07 DIAGNOSIS — D649 Anemia, unspecified: Secondary | ICD-10-CM

## 2013-07-08 ENCOUNTER — Ambulatory Visit (INDEPENDENT_AMBULATORY_CARE_PROVIDER_SITE_OTHER): Payer: Medicare Other

## 2013-07-08 DIAGNOSIS — D510 Vitamin B12 deficiency anemia due to intrinsic factor deficiency: Secondary | ICD-10-CM

## 2013-07-12 ENCOUNTER — Telehealth: Payer: Self-pay | Admitting: Pulmonary Disease

## 2013-07-12 NOTE — Telephone Encounter (Signed)
Will call daughter once this has been completed by SN.  Placed on his cart to be signed.

## 2013-07-13 NOTE — Telephone Encounter (Signed)
Pt's daughter is returning call & was advised from is up front, ready for p/u.  Pt's daughter verbalized understanding & states nothing further needed at this time.  Carrie Grimes

## 2013-07-13 NOTE — Telephone Encounter (Signed)
Form has been completed and will leave this up front to be picked up.  i have called and lmom for the pts daughter to make her aware of form that is ready.

## 2013-07-16 MED ORDER — CYANOCOBALAMIN 1000 MCG/ML IJ SOLN
1000.0000 ug | Freq: Once | INTRAMUSCULAR | Status: AC
  Administered 2013-07-16: 1000 ug via INTRAMUSCULAR

## 2013-08-03 ENCOUNTER — Ambulatory Visit: Payer: Medicare Other | Admitting: Pulmonary Disease

## 2013-08-05 ENCOUNTER — Ambulatory Visit (INDEPENDENT_AMBULATORY_CARE_PROVIDER_SITE_OTHER): Payer: Medicare Other | Admitting: Family Medicine

## 2013-08-05 ENCOUNTER — Encounter: Payer: Self-pay | Admitting: Family Medicine

## 2013-08-05 VITALS — BP 182/71 | HR 56 | Temp 98.4°F | Resp 18 | Ht 64.0 in | Wt 144.0 lb

## 2013-08-05 DIAGNOSIS — Z79899 Other long term (current) drug therapy: Secondary | ICD-10-CM

## 2013-08-05 DIAGNOSIS — I679 Cerebrovascular disease, unspecified: Secondary | ICD-10-CM

## 2013-08-05 DIAGNOSIS — D510 Vitamin B12 deficiency anemia due to intrinsic factor deficiency: Secondary | ICD-10-CM

## 2013-08-05 DIAGNOSIS — I872 Venous insufficiency (chronic) (peripheral): Secondary | ICD-10-CM

## 2013-08-05 DIAGNOSIS — F0391 Unspecified dementia with behavioral disturbance: Secondary | ICD-10-CM

## 2013-08-05 DIAGNOSIS — F03918 Unspecified dementia, unspecified severity, with other behavioral disturbance: Secondary | ICD-10-CM

## 2013-08-05 DIAGNOSIS — Z8673 Personal history of transient ischemic attack (TIA), and cerebral infarction without residual deficits: Secondary | ICD-10-CM

## 2013-08-05 NOTE — Progress Notes (Signed)
Office Note 08/07/2013  CC:  Chief Complaint  Patient presents with  . Establish Care    doctor only taking pulmonary pts now    HPI:  Carrie Grimes is a 80 y.o. White female who is here with her daughter to establish care. Patient's most recent primary MD: Dr. Lenna Gilford.  Dr. Prudence Davidson in [place] is her podiatrist.  Dr. Hassell Done is ophthalmologist.  Also saw retinal specialist in [place] for laser surgery for retinal hemorrhage repair. Old records in EPIC/HL were reviewed prior to or during today's visit.  Needs vit B 12 inj today per her routine monthly regimen. She has significant senile dementia with some behavioral disturbances such as some inappropriate/exaggerated emotional behavior, some paranoia at times per daughter.  Hx of some elevated bp on occasion when at a new MD office or when excited/nervous.    Past Medical History  Diagnosis Date  . Allergic rhinitis, cause unspecified   . Unspecified hearing loss   . Cerebrovascular disease, unspecified     CDopplers 1/13 showed mild mixed plaque w/ 40-59% (low end) bilat ICA stenoses, the prev right CAE & patch angioplasty were patent  . Unspecified venous (peripheral) insufficiency   . Pure hypercholesterolemia   . Unspecified disorder resulting from impaired renal function   . Diaphragmatic hernia without mention of obstruction or gangrene   . Unspecified vitamin D deficiency   . Unspecified cerebral artery occlusion with cerebral infarction     MRI Brain 12/12 showed large remote right hemispheric infarct w/ encephalomalacia w/ prominent small vessel dis superimposed on atrophy... (residual deficits are diminished sensation on left side, left hand grip weakness,   . Senile dementia, uncomplicated   . Pernicious anemia   . Cataracts, bilateral   . Retinal hemorrhage of right eye     Past Surgical History  Procedure Laterality Date  . Cholecystectomy    . Carotid endarterectomy    . Laser surgery on right eye  12/02/2012     [facility]    Family History  Problem Relation Age of Onset  . Heart disease Mother   . Heart disease Father     History   Social History  . Marital Status: Widowed    Spouse Name: N/A    Number of Children: N/A  . Years of Education: N/A   Occupational History  . Not on file.   Social History Main Topics  . Smoking status: Never Smoker   . Smokeless tobacco: Never Used  . Alcohol Use: No  . Drug Use: No  . Sexual Activity: Not on file   Other Topics Concern  . Not on file   Social History Narrative   Lives with Daughter in [place], [place].   [place] is daughter--Brenda Tresa Endo, and/or son Billee Cashing.   Ambulates with rolling walker and manual wheelchair.    Outpatient Encounter Prescriptions as of 08/05/2013  Medication Sig  . acetaminophen (TYLENOL) 500 MG tablet Take 2 tablet by mouth at bedtime  . atorvastatin (LIPITOR) 10 MG tablet TAKE 1 TABLET EVERY DAY  . cetirizine (ZYRTEC) 10 MG tablet Take 10 mg by mouth daily.    . Cholecalciferol (EQL VITAMIN D3) 1000 UNITS tablet Take 1,000 Units by mouth daily.    . clopidogrel (PLAVIX) 75 MG tablet Take 75 mg by mouth daily.  . CVS RANITIDINE 75 MG tablet TAKE ONE TABLET BY MOUTH AT BEDTIME  . divalproex (DEPAKOTE SPRINKLE) 125 MG capsule TAKE 1 CAPSULE TWICE DAILY  .  furosemide (LASIX) 20 MG tablet TAKE 1 TABLET EVERY DAY  . Multiple Vitamin (MULTIVITAMIN) capsule Take 1 capsule by mouth daily.    Marland Kitchen oxybutynin (DITROPAN) 5 MG tablet Take 1 tablet (5 mg total) by mouth 2 (two) times daily.  . vitamin C (ASCORBIC ACID) 500 MG tablet Take 500 mg by mouth daily. To take along with the iron tablet daily   . [DISCONTINUED] clopidogrel (PLAVIX) 75 MG tablet TAKE 1 TABLET EVERY DAY  . Dextromethorphan Polistirex (DELSYM PO) Take by mouth 2 (two) times daily as needed. Take as directed.  . iron polysaccharides (NIFEREX) 150 MG capsule Take 150 mg by mouth daily.    . sodium chloride (OCEAN) 0.65 % nasal  spray Place 1 spray into the nose at bedtime.    Allergies  Allergen Reactions  . Procaine Hcl     REACTION: pt states reaction to shot at dentist office w/ tachycardia \\T \ SOB (? if really from combination w/epi)    ROS Review of Systems  Constitutional: Negative for fever and fatigue.  HENT: Positive for hearing loss (chronic). Negative for congestion and sore throat.   Eyes: Negative for visual disturbance.  Respiratory: Negative for cough.   Cardiovascular: Negative for chest pain.  Gastrointestinal: Negative for nausea and abdominal pain.  Genitourinary: Positive for urgency (chronic). Negative for dysuria.  Musculoskeletal: Negative for back pain and joint swelling.  Skin: Negative for rash.  Neurological: Negative for seizures and headaches.  Hematological: Negative for adenopathy.  Psychiatric/Behavioral: Negative for dysphoric mood.    PE; Blood pressure 182/71, pulse 56, temperature 98.4 F (36.9 C), temperature source Temporal, resp. rate 18, height 5\' 4"  (1.626 m), weight 144 lb (65.318 kg), SpO2 99.00%. BP recheck 128/60 after pt in room for 20 minute Gen: Alert, well appearing.  Patient is oriented to person, place, situation. Constant lip smacking. CV: RRR, distant S1 and S2. Lungs: Chest is clear, no wheezing or rales. Normal symmetric air entry throughout both lung fields. No chest wall deformities or tenderness. LE's: 3+ edema bilat LE's  No skin breakdown or erythema. NEURO: CN 2-12 bilat intact grossly.  Left hand and leg strength 4+/5 strength in L arm and L leg, distal a bit weaker than proximal.      Pertinent labs:  None today  ASSESSMENT AND PLAN:   New Pt today.  Dementia with behavioral disturbance The current medical regimen is effective;  continue present plan and medications.   CEREBROVASCULAR DISEASE With hx of CVA with minimal residual deficit. Continue plavix 75mg  qd.  VENOUS INSUFFICIENCY, CHRONIC Lasix, elevate legs, low Na  diet.  PERNICIOUS ANEMIA Vit B12 1000 mcg IM given today. Return for this injection monthly.   An After Visit Summary was printed and given to the patient.  Return in about 4 months (around 12/05/2013) for routine chronic illness f/u--but needs nurse visit in 1 mo for vit B12 1000 mcg injection.

## 2013-08-06 LAB — CBC WITH DIFFERENTIAL/PLATELET
BASOS PCT: 0.3 % (ref 0.0–3.0)
Basophils Absolute: 0 10*3/uL (ref 0.0–0.1)
EOS ABS: 0.1 10*3/uL (ref 0.0–0.7)
Eosinophils Relative: 2 % (ref 0.0–5.0)
HEMATOCRIT: 31.7 % — AB (ref 36.0–46.0)
HEMOGLOBIN: 10.8 g/dL — AB (ref 12.0–15.0)
LYMPHS ABS: 1.8 10*3/uL (ref 0.7–4.0)
Lymphocytes Relative: 27.2 % (ref 12.0–46.0)
MCHC: 34 g/dL (ref 30.0–36.0)
MCV: 94.1 fl (ref 78.0–100.0)
Monocytes Absolute: 0.4 10*3/uL (ref 0.1–1.0)
Monocytes Relative: 5.3 % (ref 3.0–12.0)
NEUTROS ABS: 4.4 10*3/uL (ref 1.4–7.7)
Neutrophils Relative %: 65.2 % (ref 43.0–77.0)
Platelets: 146 10*3/uL — ABNORMAL LOW (ref 150.0–400.0)
RBC: 3.37 Mil/uL — AB (ref 3.87–5.11)
RDW: 13.2 % (ref 11.5–14.6)
WBC: 6.8 10*3/uL (ref 4.5–10.5)

## 2013-08-06 LAB — COMPREHENSIVE METABOLIC PANEL
ALT: 9 U/L (ref 0–35)
AST: 18 U/L (ref 0–37)
Albumin: 3.9 g/dL (ref 3.5–5.2)
Alkaline Phosphatase: 80 U/L (ref 39–117)
BILIRUBIN TOTAL: 0.7 mg/dL (ref 0.3–1.2)
BUN: 41 mg/dL — ABNORMAL HIGH (ref 6–23)
CO2: 26 mEq/L (ref 19–32)
CREATININE: 1.6 mg/dL — AB (ref 0.4–1.2)
Calcium: 10.1 mg/dL (ref 8.4–10.5)
Chloride: 111 mEq/L (ref 96–112)
GFR: 31.87 mL/min — ABNORMAL LOW (ref >60.00)
Glucose, Bld: 89 mg/dL (ref 70–99)
Potassium: 5.6 mEq/L — ABNORMAL HIGH (ref 3.5–5.1)
Sodium: 143 mEq/L (ref 135–145)
Total Protein: 6.9 g/dL (ref 6.0–8.3)

## 2013-08-07 NOTE — Assessment & Plan Note (Signed)
Lasix, elevate legs, low Na diet.

## 2013-08-07 NOTE — Assessment & Plan Note (Signed)
With hx of CVA with minimal residual deficit. Continue plavix 75mg  qd.

## 2013-08-07 NOTE — Assessment & Plan Note (Signed)
The current medical regimen is effective;  continue present plan and medications.  

## 2013-08-07 NOTE — Assessment & Plan Note (Signed)
Vit B12 1000 mcg IM given today. Return for this injection monthly.

## 2013-08-09 ENCOUNTER — Encounter: Payer: Self-pay | Admitting: Family Medicine

## 2013-08-12 ENCOUNTER — Telehealth: Payer: Self-pay | Admitting: Family Medicine

## 2013-08-12 NOTE — Telephone Encounter (Signed)
Please advise 

## 2013-08-12 NOTE — Telephone Encounter (Signed)
Patient's daughter aware.  Lab results put in the mail.

## 2013-08-12 NOTE — Telephone Encounter (Signed)
Level was 5.6, goal is below 5. Ok to mail results as per Daughter's request.-thx

## 2013-08-12 NOTE — Telephone Encounter (Signed)
Patient's daughter called. Her co-worker is a Engineer, maintenance (IT) at [employer]. She asked her about the low  potasium diet. She said she couldn't give any advise until she knew what the number was & what was the target # that Dr. Anitra Lauth wanted the patient to get to. Please contact patient's daughter & let her know. Can you also mail the results? Thanks

## 2013-08-29 ENCOUNTER — Other Ambulatory Visit: Payer: Self-pay | Admitting: Pulmonary Disease

## 2013-09-01 ENCOUNTER — Other Ambulatory Visit: Payer: Self-pay | Admitting: Pulmonary Disease

## 2013-09-03 ENCOUNTER — Ambulatory Visit (INDEPENDENT_AMBULATORY_CARE_PROVIDER_SITE_OTHER): Payer: Medicare Other | Admitting: Family Medicine

## 2013-09-03 DIAGNOSIS — E538 Deficiency of other specified B group vitamins: Secondary | ICD-10-CM

## 2013-09-03 MED ORDER — CYANOCOBALAMIN 1000 MCG/ML IJ SOLN
1000.0000 ug | Freq: Once | INTRAMUSCULAR | Status: AC
  Administered 2013-09-03: 1000 ug via INTRAMUSCULAR

## 2013-09-29 ENCOUNTER — Encounter: Payer: Self-pay | Admitting: Family Medicine

## 2013-09-29 ENCOUNTER — Ambulatory Visit (INDEPENDENT_AMBULATORY_CARE_PROVIDER_SITE_OTHER): Payer: Medicare Other | Admitting: Family Medicine

## 2013-09-29 VITALS — BP 188/76 | HR 60 | Temp 99.0°F | Resp 18 | Ht 64.0 in | Wt 146.0 lb

## 2013-09-29 DIAGNOSIS — L0291 Cutaneous abscess, unspecified: Secondary | ICD-10-CM

## 2013-09-29 DIAGNOSIS — L03115 Cellulitis of right lower limb: Secondary | ICD-10-CM | POA: Insufficient documentation

## 2013-09-29 DIAGNOSIS — L0390 Cellulitis, unspecified: Secondary | ICD-10-CM

## 2013-09-29 DIAGNOSIS — I872 Venous insufficiency (chronic) (peripheral): Secondary | ICD-10-CM

## 2013-09-29 DIAGNOSIS — L03116 Cellulitis of left lower limb: Secondary | ICD-10-CM

## 2013-09-29 MED ORDER — CEPHALEXIN 500 MG PO CAPS: 500.0000 mg | ORAL_CAPSULE | Freq: Two times a day (BID) | ORAL | Status: DC

## 2013-09-29 NOTE — Patient Instructions (Signed)
Take 3 of the 20mg  lasix tabs every morning

## 2013-09-29 NOTE — Progress Notes (Signed)
Pre visit review using our clinic review tool, if applicable. No additional management support is needed unless otherwise documented below in the visit note. 

## 2013-09-29 NOTE — Progress Notes (Signed)
OFFICE NOTE  09/29/2013  CC:  Chief Complaint  Patient presents with  . Foot Swelling    begining of the week     HPI: Patient is a 80 y.o. Caucasian female who is here for feet problems. Recent worsening of LE swelling, redness, puffiness--unclear amount of time. Has podiatrist office note from 09/28/13 stating she should be seen by me due to this problem. No fever, no malaise, no significant complaint of pain in legs.  Has long hx of chronic venous insuff edema, has not been compliant with compression hose in the past b/c of difficulty getting them on, also pt refused to try to wear them eventually.  She doesn't comply with low Na diet well, doesn't elevate legs enough.  Takes 20mg  lasix once daily.   Pertinent PMH:  Past Medical History  Diagnosis Date  . Allergic rhinitis, cause unspecified   . Unspecified hearing loss   . Cerebrovascular disease, unspecified     CDopplers 1/13 showed mild mixed plaque w/ 40-59% (low end) bilat ICA stenoses, the prev right CAE & patch angioplasty were patent  . Unspecified venous (peripheral) insufficiency   . Pure hypercholesterolemia   . Unspecified disorder resulting from impaired renal function     hyperkalemia and anemia  . Diaphragmatic hernia without mention of obstruction or gangrene   . Unspecified vitamin D deficiency   . Unspecified cerebral artery occlusion with cerebral infarction     MRI Brain 12/12 showed large remote right hemispheric infarct w/ encephalomalacia w/ prominent small vessel dis superimposed on atrophy... (residual deficits are diminished sensation on left side, left hand grip weakness,   . Senile dementia, uncomplicated   . Pernicious anemia   . Cataracts, bilateral   . Retinal hemorrhage of right eye    Past Surgical History  Procedure Laterality Date  . Cholecystectomy    . Carotid endarterectomy    . Laser surgery on right eye  12/02/2012    Dr. [facility]aird [facility] at [facility]   History   Social History  Narrative   Lives with Daughter in [place], [place].   [place] is daughter--Brenda Tresa Endo, and/or son Billee Cashing.   Ambulates with rolling walker and manual wheelchair.    MEDS:  Outpatient Prescriptions Prior to Visit  Medication Sig Dispense Refill  . acetaminophen (TYLENOL) 500 MG tablet Take 2 tablet by mouth at bedtime      . atorvastatin (LIPITOR) 10 MG tablet TAKE 1 TABLET EVERY DAY  30 tablet  5  . Cholecalciferol (EQL VITAMIN D3) 1000 UNITS tablet Take 1,000 Units by mouth daily.        . clopidogrel (PLAVIX) 75 MG tablet Take 75 mg by mouth daily.      . CVS RANITIDINE 75 MG tablet TAKE ONE TABLET BY MOUTH AT BEDTIME  30 tablet  9  . divalproex (DEPAKOTE SPRINKLE) 125 MG capsule TAKE 1 CAPSULE TWICE DAILY  60 capsule  5  . furosemide (LASIX) 20 MG tablet TAKE 1 TABLET EVERY DAY  30 tablet  4  . iron polysaccharides (NIFEREX) 150 MG capsule Take 150 mg by mouth daily.        . Multiple Vitamin (MULTIVITAMIN) capsule Take 1 capsule by mouth daily.        Marland Kitchen oxybutynin (DITROPAN) 5 MG tablet Take 1 tablet (5 mg total) by mouth 2 (two) times daily.  60 tablet  11  . sodium chloride (OCEAN) 0.65 % nasal spray Place 1 spray into the nose at bedtime.      Marland Kitchen  vitamin C (ASCORBIC ACID) 500 MG tablet Take 500 mg by mouth daily. To take along with the iron tablet daily       . CVS RANITIDINE 75 MG tablet TAKE ONE TABLET BY MOUTH AT BEDTIME  30 tablet  2  . cetirizine (ZYRTEC) 10 MG tablet Take 10 mg by mouth daily.        Marland Kitchen Dextromethorphan Polistirex (DELSYM PO) Take by mouth 2 (two) times daily as needed. Take as directed.       No facility-administered medications prior to visit.    PE: Blood pressure 188/76, pulse 60, temperature 99 F (37.2 C), temperature source Temporal, resp. rate 18, height 5\' 4"  (1.626 m), weight 146 lb (66.225 kg), SpO2 97.00%. Gen: Alert, well appearing.  Patient is oriented to person, place, time, and situation. Affect: pleasantly demented Rolling walker  beside her. CV: RRR, soft systolic murmur Chest is clear, no wheezing or rales. Normal symmetric air entry throughout both lung fields. No chest wall deformities or tenderness. LEGS: From knees down she has 4+ pitting edema with diffuse erythema in pretibial regions bilat, mild tenderness with firm palpation. Small spots of weeping clear fluid but no distinct ulceration.  I cannot palpate her pedal pulses due to excessive edema.  LABS: none today   Chemistry      Component Value Date/Time   NA 143 08/05/2013 1645   K 5.6* 08/05/2013 1645   CL 111 08/05/2013 1645   CO2 26 08/05/2013 1645   BUN 41* 08/05/2013 1645   CREATININE 1.6* 08/05/2013 1645      Component Value Date/Time   CALCIUM 10.1 08/05/2013 1645   ALKPHOS 80 08/05/2013 1645   AST 18 08/05/2013 1645   ALT 9 08/05/2013 1645   BILITOT 0.7 08/05/2013 1645    She is trying to comply with low potassium diet.   IMPRESSION AND PLAN:  VENOUS INSUFFICIENCY, CHRONIC In the setting of dementia, poor ambulatory status, and CRI. Emphasized importance of low Na diet, elevation of legs above the level of the heart for 20 min a day minimum. We'll try compression stockings again--rx for 20 to 30 mmHg pressure--likely won't be able to get these on until some of the swelling is better. Increase lasix to 60mg  qd for the next 4d. I'll see her in 5d for recheck of legs and for BMET. Empiric treatment for possible cellulitic component: cephalexin at renal dosing --500 mg bid x 10d. No topical treatment at this time.   Spent 25 min with pt today, with >50% of this time spent in counseling and care coordination regarding the above problems.  An After Visit Summary was printed and given to the patient.  FOLLOW UP: 5d

## 2013-09-29 NOTE — Assessment & Plan Note (Signed)
In the setting of dementia, poor ambulatory status, and CRI. Emphasized importance of low Na diet, elevation of legs above the level of the heart for 20 min a day minimum. We'll try compression stockings again--rx for 20 to 30 mmHg pressure--likely won't be able to get these on until some of the swelling is better. Increase lasix to 60mg  qd for the next 4d. I'll see her in 5d for recheck of legs and for BMET. Empiric treatment for possible cellulitic component: cephalexin at renal dosing --500 mg bid x 10d. No topical treatment at this time.

## 2013-09-30 ENCOUNTER — Telehealth: Payer: Self-pay | Admitting: Family Medicine

## 2013-09-30 ENCOUNTER — Ambulatory Visit: Payer: Medicare Other

## 2013-09-30 NOTE — Telephone Encounter (Signed)
Tell her not to sweat the stockings thing right now. Concentrate on just having her elevate her legs ABOVE the level of her heart (lie on bed or floor with feet propped up on pillows---NOT just sitting in a recliner with her legs stretched out in front of her). Also, I want her to go ahead and sip on water but not drink it in excessive amounts, and I want her to limit intake of liquids and solids that have sodium in them.  Take diuretic and antibiotic as instructed.  Remind her that getting this better takes time and it is much better done at home than in the hospital. I don't recommend she take her to the hospital at this time.-thx

## 2013-09-30 NOTE — Telephone Encounter (Signed)
Pt's daughter aware.  She voiced understanding of instructions and seemed much more at ease when I was finished explaining this to her again.

## 2013-09-30 NOTE — Telephone Encounter (Signed)
Pt's daughter called stating that the pt was measured for XL compression stockings at [facility] med supply and when pt got home they could not get the stockings on the pt without her screaming and hollering.  This was the largest stocking in stock so they ordered to get them with a zipper which might make it easier to get on pt but they won't arrive until Saturday.  Pt's daughter has pt consuming lots of fluids ( as many as patient will consume ) and has her feet up with support hose on until they get her compression stockings on Saturday.  Pt's legs seem to be seeping more fluid than yesterday.  Pt's daughter wanted to know what she should do, if anything else?   Pt's daughter wondered if pt should just go to hospital to stay for awhile since they are better equipped for this kind of care.  Please advise.

## 2013-10-04 ENCOUNTER — Encounter: Payer: Self-pay | Admitting: Family Medicine

## 2013-10-04 ENCOUNTER — Ambulatory Visit: Payer: Medicare Other

## 2013-10-04 ENCOUNTER — Ambulatory Visit (INDEPENDENT_AMBULATORY_CARE_PROVIDER_SITE_OTHER): Payer: Medicare Other | Admitting: Family Medicine

## 2013-10-04 VITALS — BP 151/65 | HR 61 | Temp 99.2°F | Resp 16 | Ht 64.0 in | Wt 142.0 lb

## 2013-10-04 DIAGNOSIS — R609 Edema, unspecified: Secondary | ICD-10-CM

## 2013-10-04 DIAGNOSIS — I872 Venous insufficiency (chronic) (peripheral): Secondary | ICD-10-CM

## 2013-10-04 DIAGNOSIS — N183 Chronic kidney disease, stage 3 unspecified: Secondary | ICD-10-CM

## 2013-10-04 DIAGNOSIS — D510 Vitamin B12 deficiency anemia due to intrinsic factor deficiency: Secondary | ICD-10-CM

## 2013-10-04 LAB — PHOSPHORUS: PHOSPHORUS: 2.9 mg/dL (ref 2.3–4.6)

## 2013-10-04 LAB — BASIC METABOLIC PANEL
BUN: 39 mg/dL — AB (ref 6–23)
CHLORIDE: 106 meq/L (ref 96–112)
CO2: 30 mEq/L (ref 19–32)
Calcium: 9.6 mg/dL (ref 8.4–10.5)
Creatinine, Ser: 1.9 mg/dL — ABNORMAL HIGH (ref 0.4–1.2)
GFR: 26.81 mL/min — ABNORMAL LOW (ref >60.00)
Glucose, Bld: 76 mg/dL (ref 70–99)
POTASSIUM: 4.4 meq/L (ref 3.5–5.1)
Sodium: 144 mEq/L (ref 135–145)

## 2013-10-04 LAB — MAGNESIUM: MAGNESIUM: 2.1 mg/dL (ref 1.5–2.5)

## 2013-10-04 MED ORDER — FUROSEMIDE 20 MG PO TABS: ORAL_TABLET | ORAL | Status: DC

## 2013-10-04 MED ORDER — CYANOCOBALAMIN 1000 MCG/ML IJ SOLN
1000.0000 ug | Freq: Once | INTRAMUSCULAR | Status: AC
  Administered 2013-10-04: 1000 ug via INTRAMUSCULAR

## 2013-10-04 MED ORDER — UREA IN ZN UNDECYL-LACTIC ACID 45 % EX EMUL: CUTANEOUS | Status: DC

## 2013-10-04 NOTE — Progress Notes (Signed)
OFFICE NOTE  10/04/2013  CC:  Chief Complaint  Patient presents with  . Follow-up     HPI: Patient is a 80 y.o. Caucasian female who is here for 5d f/u LE edema + possible cellulitis of anterior lower legs vs venous stasis dermatitis.  I rx'd keflex, increased her lasix to 60 mg qd for 4d, emphasized low Na diet and elevation of legs above level of heart. Doing much better.  Could not get compression hose on but now that some swelling is gone they'll try again. Caregiver says no fever or malaise but pt acting "feisty" lately.  ? Sign of feeling better. They ask about a cream to apply to LE's.  Pertinent PMH:  Past medical, surgical, social, and family history reviewed and no changes are noted since last office visit.  MEDS:  Outpatient Prescriptions Prior to Visit  Medication Sig Dispense Refill  . acetaminophen (TYLENOL) 500 MG tablet Take 2 tablet by mouth at bedtime      . atorvastatin (LIPITOR) 10 MG tablet TAKE 1 TABLET EVERY DAY  30 tablet  5  . cephALEXin (KEFLEX) 500 MG capsule Take 1 capsule (500 mg total) by mouth 2 (two) times daily.  20 capsule  0  . cetirizine (ZYRTEC) 10 MG tablet Take 10 mg by mouth daily.        . Cholecalciferol (EQL VITAMIN D3) 1000 UNITS tablet Take 1,000 Units by mouth daily.        . clopidogrel (PLAVIX) 75 MG tablet Take 75 mg by mouth daily.      . CVS RANITIDINE 75 MG tablet TAKE ONE TABLET BY MOUTH AT BEDTIME  30 tablet  9  . divalproex (DEPAKOTE SPRINKLE) 125 MG capsule TAKE 1 CAPSULE TWICE DAILY  60 capsule  5  . furosemide (LASIX) 20 MG tablet TAKE 1 TABLET EVERY DAY  30 tablet  4  . iron polysaccharides (NIFEREX) 150 MG capsule Take 150 mg by mouth daily.        Marland Kitchen loratadine (CLARITIN REDITABS) 10 MG dissolvable tablet Take 10 mg by mouth daily as needed for allergies.      . Multiple Vitamin (MULTIVITAMIN) capsule Take 1 capsule by mouth daily.        Marland Kitchen oxybutynin (DITROPAN) 5 MG tablet Take 1 tablet (5 mg total) by mouth 2 (two) times  daily.  60 tablet  11  . sodium chloride (OCEAN) 0.65 % nasal spray Place 1 spray into the nose at bedtime.      . vitamin C (ASCORBIC ACID) 500 MG tablet Take 500 mg by mouth daily. To take along with the iron tablet daily        No facility-administered medications prior to visit.    PE: Blood pressure 151/65, pulse 61, temperature 99.2 F (37.3 C), temperature source Temporal, resp. rate 16, height 5\' 4"  (1.626 m), weight 142 lb (64.411 kg), SpO2 96.00%. Gen: Alert, well appearing.  Patient is hard of hearing, pleasantly demented. LEGS: from mid tibia level down to toes bilat she has pinkish, hyperkeratotic skin with 3-4 + pitting edema.  Skin is not tense and there is no weeping.  No ulcers.  I can feel faint DP pulses on each foot through all the puffy edema.  IMPRESSION AND PLAN:  Periph edema secondary to chronic venous insufficiency: in the setting of CRI stage 3, dementia, and mildly impaired ambulation. She has made significant improvements. Continue to focus on at least 20 min bid elevation of legs above heart.  Finish keflex. Retry compression stockings. Decrease lasix back to 20mg  qd alt w/ 40mg  qd. Start kerol cream for LE's. Check BMET, Mag, Phos today. Hx of pernicious anemia: gave her vit B12 1000 mcg IM today.  FOLLOW UP: 1 mo

## 2013-10-04 NOTE — Progress Notes (Signed)
Pre visit review using our clinic review tool, if applicable. No additional management support is needed unless otherwise documented below in the visit note. 

## 2013-11-02 ENCOUNTER — Encounter: Payer: Self-pay | Admitting: Family Medicine

## 2013-11-02 ENCOUNTER — Ambulatory Visit (INDEPENDENT_AMBULATORY_CARE_PROVIDER_SITE_OTHER): Payer: Medicare Other | Admitting: Family Medicine

## 2013-11-02 VITALS — BP 165/69 | HR 55 | Temp 98.8°F | Resp 18 | Ht 64.0 in | Wt 135.0 lb

## 2013-11-02 DIAGNOSIS — I8310 Varicose veins of unspecified lower extremity with inflammation: Secondary | ICD-10-CM

## 2013-11-02 DIAGNOSIS — I872 Venous insufficiency (chronic) (peripheral): Secondary | ICD-10-CM

## 2013-11-02 DIAGNOSIS — E538 Deficiency of other specified B group vitamins: Secondary | ICD-10-CM

## 2013-11-02 DIAGNOSIS — E875 Hyperkalemia: Secondary | ICD-10-CM

## 2013-11-02 DIAGNOSIS — N259 Disorder resulting from impaired renal tubular function, unspecified: Secondary | ICD-10-CM

## 2013-11-02 DIAGNOSIS — I10 Essential (primary) hypertension: Secondary | ICD-10-CM | POA: Insufficient documentation

## 2013-11-02 DIAGNOSIS — R609 Edema, unspecified: Secondary | ICD-10-CM

## 2013-11-02 LAB — BASIC METABOLIC PANEL
BUN: 54 mg/dL — ABNORMAL HIGH (ref 6–23)
CHLORIDE: 105 meq/L (ref 96–112)
CO2: 24 mEq/L (ref 19–32)
Calcium: 9.7 mg/dL (ref 8.4–10.5)
Creatinine, Ser: 2.2 mg/dL — ABNORMAL HIGH (ref 0.4–1.2)
GFR: 22.34 mL/min — ABNORMAL LOW (ref >60.00)
Glucose, Bld: 136 mg/dL — ABNORMAL HIGH (ref 70–99)
Potassium: 4 mEq/L (ref 3.5–5.1)
SODIUM: 140 meq/L (ref 135–145)

## 2013-11-02 MED ORDER — CYANOCOBALAMIN 1000 MCG/ML IJ SOLN
1000.0000 ug | Freq: Once | INTRAMUSCULAR | Status: AC
  Administered 2013-11-02: 1000 ug via INTRAMUSCULAR

## 2013-11-02 NOTE — Assessment & Plan Note (Signed)
Much improved: continue current lasix dosing but check BMET today. Continue other current measures PLUS try adding compression hose now.

## 2013-11-02 NOTE — Progress Notes (Signed)
OFFICE NOTE  11/02/2013  CC:  Chief Complaint  Patient presents with  . Follow-up    should patient continue leg cream?     HPI: Patient is a 80 y.o. Caucasian female who is here with her caregiver for 37mo f/u chronic LE venous insufficiency edema and dermatitis, CRI. Using urea cream, taking lasix 20mg  qd alt/w 40mg  qd.  Using the urea emuls on LE's regularly and it costs 100 dollars/month. She is continuing to try to eat low Na and low Pot diet. She doesn't complain of anything.  Appetite is good but she insists on pureed food, even meats.  Pertinent PMH:  Past medical, surgical, social, and family history reviewed and no changes are noted since last office visit.  MEDS: Not taking cephalexin listed below Outpatient Prescriptions Prior to Visit  Medication Sig Dispense Refill  . acetaminophen (TYLENOL) 500 MG tablet Take 2 tablet by mouth at bedtime      . atorvastatin (LIPITOR) 10 MG tablet TAKE 1 TABLET EVERY DAY  30 tablet  5  . cephALEXin (KEFLEX) 500 MG capsule Take 1 capsule (500 mg total) by mouth 2 (two) times daily.  20 capsule  0  . cetirizine (ZYRTEC) 10 MG tablet Take 10 mg by mouth daily.        . Cholecalciferol (EQL VITAMIN D3) 1000 UNITS tablet Take 1,000 Units by mouth daily.        . clopidogrel (PLAVIX) 75 MG tablet Take 75 mg by mouth daily.      . CVS RANITIDINE 75 MG tablet TAKE ONE TABLET BY MOUTH AT BEDTIME  30 tablet  9  . divalproex (DEPAKOTE SPRINKLE) 125 MG capsule TAKE 1 CAPSULE TWICE DAILY  60 capsule  5  . furosemide (LASIX) 20 MG tablet 1 tab po qd alternating with 2 tabs qd  45 tablet  6  . iron polysaccharides (NIFEREX) 150 MG capsule Take 150 mg by mouth daily.        Marland Kitchen loratadine (CLARITIN REDITABS) 10 MG dissolvable tablet Take 10 mg by mouth daily as needed for allergies.      . Multiple Vitamin (MULTIVITAMIN) capsule Take 1 capsule by mouth daily.        Marland Kitchen oxybutynin (DITROPAN) 5 MG tablet Take 1 tablet (5 mg total) by mouth 2 (two) times  daily.  60 tablet  11  . sodium chloride (OCEAN) 0.65 % nasal spray Place 1 spray into the nose at bedtime.      . Urea in Zn Undecyl-Lactic Acid 45 % EMUL Apply to lower extremities once daily  240 mL  6  . vitamin C (ASCORBIC ACID) 500 MG tablet Take 500 mg by mouth daily. To take along with the iron tablet daily        No facility-administered medications prior to visit.    PE: Blood pressure 165/69, pulse 55, temperature 98.8 F (37.1 C), temperature source Temporal, resp. rate 18, height 5\' 4"  (1.626 m), weight 135 lb (61.236 kg), SpO2 99.00%. Gen: Alert, well appearing.  Patient is oriented to person. Pleasantly demented, as per her usual. LEGS: 2+ pitting edema in both LL'e, L>R in ankles.  NO tight edema.  Mild pinkish hyperkeratotic rash in both pretibial regions, L>R.  No tenderness, no streaking, no ulceration.  IMPRESSION AND PLAN:  VENOUS INSUFFICIENCY, CHRONIC Much improved: continue current lasix dosing but check BMET today. Continue other current measures PLUS try adding compression hose now.  HTN (hypertension), benign Mild. Ok w/out meds at this time.  Venous stasis dermatitis Much improved with improvement in edema + use of urea cream daily. Given cost of the cream, they'll start using this on prn basis instead of daily. No sign of infection currently.  RENAL INSUFFICIENCY With hx of hyperkalemia. Has been on low K diet. Recheck BMET today.  Vit B12 def: vit B12 1000 mcg IM given today.  An After Visit Summary was printed and given to the patient.  FOLLOW UP: 50mo

## 2013-11-02 NOTE — Assessment & Plan Note (Signed)
With hx of hyperkalemia. Has been on low K diet. Recheck BMET today.

## 2013-11-02 NOTE — Progress Notes (Signed)
Pre visit review using our clinic review tool, if applicable. No additional management support is needed unless otherwise documented below in the visit note. 

## 2013-11-02 NOTE — Assessment & Plan Note (Signed)
Much improved with improvement in edema + use of urea cream daily. Given cost of the cream, they'll start using this on prn basis instead of daily. No sign of infection currently.

## 2013-11-02 NOTE — Assessment & Plan Note (Signed)
Mild. Ok w/out meds at this time.

## 2013-11-03 ENCOUNTER — Ambulatory Visit: Payer: Medicare Other | Admitting: Family Medicine

## 2013-11-26 ENCOUNTER — Other Ambulatory Visit: Payer: Self-pay | Admitting: Family Medicine

## 2013-11-26 ENCOUNTER — Other Ambulatory Visit: Payer: Self-pay | Admitting: Pulmonary Disease

## 2013-11-29 ENCOUNTER — Other Ambulatory Visit: Payer: Self-pay | Admitting: Family Medicine

## 2013-11-29 ENCOUNTER — Other Ambulatory Visit: Payer: Self-pay | Admitting: Pulmonary Disease

## 2013-11-29 ENCOUNTER — Ambulatory Visit (INDEPENDENT_AMBULATORY_CARE_PROVIDER_SITE_OTHER): Payer: Medicare Other | Admitting: Family Medicine

## 2013-11-29 DIAGNOSIS — E538 Deficiency of other specified B group vitamins: Secondary | ICD-10-CM

## 2013-11-29 MED ORDER — CYANOCOBALAMIN 1000 MCG/ML IJ SOLN
1000.0000 ug | Freq: Once | INTRAMUSCULAR | Status: AC
  Administered 2013-11-29: 1000 ug via INTRAMUSCULAR

## 2013-12-02 ENCOUNTER — Ambulatory Visit: Payer: Medicare Other | Admitting: Family Medicine

## 2014-01-01 ENCOUNTER — Other Ambulatory Visit: Payer: Self-pay | Admitting: Family Medicine

## 2014-01-05 ENCOUNTER — Encounter: Payer: Self-pay | Admitting: Family Medicine

## 2014-01-05 ENCOUNTER — Ambulatory Visit: Payer: Medicare Other | Admitting: Family Medicine

## 2014-01-05 ENCOUNTER — Ambulatory Visit (INDEPENDENT_AMBULATORY_CARE_PROVIDER_SITE_OTHER): Payer: Medicare Other | Admitting: Family Medicine

## 2014-01-05 VITALS — BP 188/70 | HR 58 | Temp 98.9°F | Resp 18 | Ht 64.0 in | Wt 134.0 lb

## 2014-01-05 DIAGNOSIS — I872 Venous insufficiency (chronic) (peripheral): Secondary | ICD-10-CM

## 2014-01-05 DIAGNOSIS — F0391 Unspecified dementia with behavioral disturbance: Secondary | ICD-10-CM

## 2014-01-05 DIAGNOSIS — N183 Chronic kidney disease, stage 3 unspecified: Secondary | ICD-10-CM

## 2014-01-05 DIAGNOSIS — I10 Essential (primary) hypertension: Secondary | ICD-10-CM

## 2014-01-05 DIAGNOSIS — E785 Hyperlipidemia, unspecified: Secondary | ICD-10-CM

## 2014-01-05 DIAGNOSIS — Z23 Encounter for immunization: Secondary | ICD-10-CM

## 2014-01-05 DIAGNOSIS — F03918 Unspecified dementia, unspecified severity, with other behavioral disturbance: Secondary | ICD-10-CM

## 2014-01-05 DIAGNOSIS — E538 Deficiency of other specified B group vitamins: Secondary | ICD-10-CM

## 2014-01-05 DIAGNOSIS — E875 Hyperkalemia: Secondary | ICD-10-CM

## 2014-01-05 MED ORDER — FUROSEMIDE 20 MG PO TABS: 20.0000 mg | ORAL_TABLET | Freq: Every day | ORAL | Status: DC

## 2014-01-05 MED ORDER — CYANOCOBALAMIN 1000 MCG/ML IJ SOLN
1000.0000 ug | Freq: Once | INTRAMUSCULAR | Status: AC
  Administered 2014-01-05: 1000 ug via INTRAMUSCULAR

## 2014-01-05 NOTE — Progress Notes (Signed)
Pre visit review using our clinic review tool, if applicable. No additional management support is needed unless otherwise documented below in the visit note. 

## 2014-01-05 NOTE — Progress Notes (Signed)
OFFICE NOTE  01/05/2014  CC:  Chief Complaint  Patient presents with  . Follow-up     HPI: Patient is a 80 y.o. Caucasian female who is here for f/u CRI, chronic LE venous insufficiency w/dermatitis, dementia with behavioral disturbance, hx of CVA. Decreased lasix to 20mg  qd 2 mo ago due to rising Cr.  Has hx of hyperkalemia that has responded well to low K diet.  No acute complaints.  Bilat leg swelling seems to be stable. She is here accompanied by her daughter and son today: daughter usually cares for her mother in her home but she states that for multiple reasons she is unable to keep doing this.  She asks for my assistance with placing her mother in a NH, son agrees. She requires full care: bathing, meal prep, dressing, assistance with ambulation, assistance with taking medications.  Pertinent PMH:  Past medical, surgical, social, and family history reviewed and no changes are noted since last office visit.  MEDS: Not taking keflex listed below. Outpatient Prescriptions Prior to Visit  Medication Sig Dispense Refill  . acetaminophen (TYLENOL) 500 MG tablet Take 2 tablet by mouth at bedtime      . atorvastatin (LIPITOR) 10 MG tablet TAKE 1 TABLET EVERY DAY  30 tablet  5  . cetirizine (ZYRTEC) 10 MG tablet Take 10 mg by mouth daily.        . Cholecalciferol (EQL VITAMIN D3) 1000 UNITS tablet Take 1,000 Units by mouth daily.        . clopidogrel (PLAVIX) 75 MG tablet Take 75 mg by mouth daily.      . CVS RANITIDINE 75 MG tablet TAKE ONE TABLET BY MOUTH AT BEDTIME  30 tablet  9  . divalproex (DEPAKOTE SPRINKLE) 125 MG capsule TAKE 1 CAPSULE TWICE DAILY  60 capsule  5  . iron polysaccharides (NIFEREX) 150 MG capsule Take 150 mg by mouth daily.        Marland Kitchen loratadine (CLARITIN REDITABS) 10 MG dissolvable tablet Take 10 mg by mouth daily as needed for allergies.      . Multiple Vitamin (MULTIVITAMIN) capsule Take 1 capsule by mouth daily.        Marland Kitchen oxybutynin (DITROPAN) 5 MG tablet Take 1  tablet (5 mg total) by mouth 2 (two) times daily.  60 tablet  11  . sodium chloride (OCEAN) 0.65 % nasal spray Place 1 spray into the nose at bedtime.      . Urea in Zn Undecyl-Lactic Acid 45 % EMUL Apply to lower extremities once daily  240 mL  6  . vitamin C (ASCORBIC ACID) 500 MG tablet Take 500 mg by mouth daily. To take along with the iron tablet daily       . cephALEXin (KEFLEX) 500 MG capsule Take 1 capsule (500 mg total) by mouth 2 (two) times daily.  20 capsule  0  . furosemide (LASIX) 20 MG tablet 1 tab po qd alternating with 2 tabs qd  45 tablet  6  . furosemide (LASIX) 20 MG tablet TAKE 1 TABLET EVERY DAY  30 tablet  4  . atorvastatin (LIPITOR) 10 MG tablet TAKE 1 TABLET EVERY DAY  30 tablet  0  . clopidogrel (PLAVIX) 75 MG tablet TAKE 1 TABLET EVERY DAY  30 tablet  4  . CVS RANITIDINE 75 MG tablet TAKE ONE TABLET BY MOUTH AT BEDTIME  30 tablet  2   No facility-administered medications prior to visit.    PE: Blood pressure 188/70, pulse  58, temperature 98.9 F (37.2 C), temperature source Temporal, resp. rate 18, height 5\' 4"  (1.626 m), weight 134 lb (60.782 kg), SpO2 99.00%. Gen: Alert, well appearing.  Patient is oriented to person, place, time, and situation. Very hard-of-hearing. Pleasantly demented. Sitting in chair with her walker in front of her. CV: RRR, no m/r/g.   LUNGS: CTA bilat, nonlabored resps, good aeration in all lung fields. EXT: 2+ edema in right pretibial region down into ankle and top of foot, 3+ on left--with more prominent ankle swelling on left compared to right.  Violaceous hue to skin of distal feet, cap refill 1-2 seconds.  DP and PT pulses difficult to palpate due to pt's edema.  No skin breakdown or sign of dermatitis or infection.  LAB: none today Recent: Lab Results  Component Value Date   WBC 6.8 08/05/2013   HGB 10.8* 08/05/2013   HCT 31.7* 08/05/2013   MCV 94.1 08/05/2013   PLT 146.0* 08/05/2013     Chemistry      Component Value Date/Time    NA 140 11/02/2013 1057   K 4.0 11/02/2013 1057   CL 105 11/02/2013 1057   CO2 24 11/02/2013 1057   BUN 54* 11/02/2013 1057   CREATININE 2.2* 11/02/2013 1057      Component Value Date/Time   CALCIUM 9.7 11/02/2013 1057   ALKPHOS 80 08/05/2013 1645   AST 18 08/05/2013 1645   ALT 9 08/05/2013 1645   BILITOT 0.7 08/05/2013 1645     Lab Results  Component Value Date   CHOL 133 12/02/2012   HDL 42.60 12/02/2012   LDLCALC 64 12/02/2012   LDLDIRECT 74.7 01/29/2011   TRIG 132.0 12/02/2012   CHOLHDL 3 12/02/2012     IMPRESSION AND PLAN:  1) Chronic LE venous insufficiency edema: fairly stable with current measures. Continue lasix 20mg  qd, recheck BMET today.  2) CRI with hx of hyperkalemia: recheck BMET today.  3) HTN: we have no home bp's to go on, so I asked her daughter to try to get a cuff for her.  I'm hesitant to start antihypertensive at this time based on just her office bps---since our chances of hurting her with these meds are almost as good as the chances of helping her with them.  If bp's persistently up, would likely start low dose felodipine or amlodipine and hope that it doesn't worsen her LE swelling.  4) Dementia, senile, with hx of behavioral disturbance. Doing well currently on depakote 125mg  bid.  No sign of med toxicity. Last LFT's and CBC  5) Hyperlipidemia: tolerating statin.  Plan to recheck FLP at next office f/u in 4 mo.  6) Pernicious anemia: vit B12 1000 mcg IM today.  7) Preventative health care: flu vaccine IM today.  I made a [facility] referral today for assistance with NH placement, as per daughter and son's request.  An After Visit Summary was printed and given to the patient.  FOLLOW UP: 30mo

## 2014-01-06 LAB — BASIC METABOLIC PANEL
BUN: 52 mg/dL — ABNORMAL HIGH (ref 6–23)
CALCIUM: 10.1 mg/dL (ref 8.4–10.5)
CO2: 27 mEq/L (ref 19–32)
Chloride: 106 mEq/L (ref 96–112)
Creatinine, Ser: 1.7 mg/dL — ABNORMAL HIGH (ref 0.4–1.2)
GFR: 29.11 mL/min — AB (ref >60.00)
Glucose, Bld: 87 mg/dL (ref 70–99)
POTASSIUM: 4.8 meq/L (ref 3.5–5.1)
SODIUM: 140 meq/L (ref 135–145)

## 2014-01-07 NOTE — Progress Notes (Signed)
Quick Note:  Please notify patient caregiver that lab work was stable. Continue all current medications at current doses. Thanks. ______

## 2014-01-10 ENCOUNTER — Telehealth: Payer: Self-pay | Admitting: Family Medicine

## 2014-01-10 NOTE — Telephone Encounter (Signed)
She is returning your call. Okay to lv detailed message on home ans mach or you can speak with her personally after 3:00 on her cell.

## 2014-01-11 NOTE — Telephone Encounter (Signed)
I left detailed message on machine stating all labs are stable.

## 2014-01-14 ENCOUNTER — Telehealth: Payer: Self-pay | Admitting: Family Medicine

## 2014-01-14 NOTE — Telephone Encounter (Signed)
Caller name: Quentin Angst --[facility] Relation to pt: Call back number: [phone] Pharmacy:  Reason for call:   Rise Paganini from [facility] called stating that she spoke to patient and patient's daughter and they feel that patient needs to be placed into assisted living. Per Rise Paganini, it is okay to leave a detailed message when calling back.

## 2014-01-17 NOTE — Telephone Encounter (Signed)
Please advise 

## 2014-01-17 NOTE — Telephone Encounter (Signed)
Ok, so I guess they need my verbal order to get this process started??  If so, go ahead and give it.-thx

## 2014-01-18 NOTE — Telephone Encounter (Signed)
No verbal ordered required. Spoke to [place] from [facility] and she said that [facility] form is needed.   I advised her that we do not carry those forms and the daughter would have to choose a living facility and get the form from them for Dr. Anitra Lauth to fill out.  She will let pt's daughter know.

## 2014-01-31 ENCOUNTER — Other Ambulatory Visit: Payer: Self-pay | Admitting: *Deleted

## 2014-01-31 MED ORDER — ATORVASTATIN CALCIUM 10 MG PO TABS: ORAL_TABLET | ORAL | Status: DC

## 2014-01-31 NOTE — Telephone Encounter (Signed)
Please advise refill for atorvastatin 10 mg? Last filled by Dr. Leeanne Deed 10/26/2012 #30 x5.

## 2014-02-03 ENCOUNTER — Telehealth: Payer: Self-pay | Admitting: Family Medicine

## 2014-02-03 NOTE — Telephone Encounter (Signed)
OK, noted

## 2014-02-03 NOTE — Telephone Encounter (Signed)
Carrie Grimes fell last night at ~ 9:00pm and daughter did not find her for 1hr and 15 mins. Then called EMS who checked her out and said she was fine. They have a nurse visit appointment tomorrow and wondered if they should have Dr. Visit as well. After speaking to CMA, as long as there are no concerns we can stay with the nurse visit. Carrie Grimes was given this message and agreed to keep nurse appt. Carrie Grimes

## 2014-02-04 ENCOUNTER — Ambulatory Visit (INDEPENDENT_AMBULATORY_CARE_PROVIDER_SITE_OTHER): Payer: Medicare Other | Admitting: *Deleted

## 2014-02-04 DIAGNOSIS — E538 Deficiency of other specified B group vitamins: Secondary | ICD-10-CM

## 2014-02-04 MED ORDER — CYANOCOBALAMIN 1000 MCG/ML IJ SOLN
1000.0000 ug | Freq: Once | INTRAMUSCULAR | Status: AC
  Administered 2014-02-04: 1000 ug via INTRAMUSCULAR

## 2014-02-26 ENCOUNTER — Telehealth: Payer: Self-pay | Admitting: Pulmonary Disease

## 2014-02-28 ENCOUNTER — Encounter: Payer: Self-pay | Admitting: Family Medicine

## 2014-02-28 ENCOUNTER — Ambulatory Visit (INDEPENDENT_AMBULATORY_CARE_PROVIDER_SITE_OTHER): Payer: Medicare Other | Admitting: Family Medicine

## 2014-02-28 VITALS — BP 160/60 | HR 59 | Temp 97.8°F | Ht 64.0 in | Wt 136.0 lb

## 2014-02-28 DIAGNOSIS — R609 Edema, unspecified: Secondary | ICD-10-CM

## 2014-02-28 DIAGNOSIS — Z111 Encounter for screening for respiratory tuberculosis: Secondary | ICD-10-CM

## 2014-02-28 DIAGNOSIS — F0150 Vascular dementia without behavioral disturbance: Secondary | ICD-10-CM

## 2014-02-28 DIAGNOSIS — E538 Deficiency of other specified B group vitamins: Secondary | ICD-10-CM

## 2014-02-28 DIAGNOSIS — F0390 Unspecified dementia without behavioral disturbance: Secondary | ICD-10-CM

## 2014-02-28 MED ORDER — CYANOCOBALAMIN 1000 MCG/ML IJ SOLN
1000.0000 ug | Freq: Once | INTRAMUSCULAR | Status: AC
  Administered 2014-02-28: 1000 ug via INTRAMUSCULAR

## 2014-02-28 NOTE — Progress Notes (Signed)
Pre visit review using our clinic review tool, if applicable. No additional management support is needed unless otherwise documented below in the visit note. 

## 2014-02-28 NOTE — Progress Notes (Signed)
OFFICE VISIT  02/28/2014   CC: ALF placement HPI:    Patient is a 80 y.o. Caucasian female who presents for pre-visit prep before entering ALF/NH. Here with her son today who has no helpful historical information. Needs FL2 filled out, needs TB skin test.  Due for her routine monthly vit B12 injection. Has assessment at [facility] tomorrow. No acute complaints.   Past Medical History  Diagnosis Date  . Allergic rhinitis, cause unspecified   . Unspecified hearing loss   . Cerebrovascular disease, unspecified     CDopplers 1/13 showed mild mixed plaque w/ 40-59% (low end) bilat ICA stenoses, the prev right CAE & patch angioplasty were patent  . Unspecified venous (peripheral) insufficiency   . Pure hypercholesterolemia   . Unspecified disorder resulting from impaired renal function     hyperkalemia and anemia  . Diaphragmatic hernia without mention of obstruction or gangrene   . Unspecified vitamin D deficiency   . Unspecified cerebral artery occlusion with cerebral infarction     MRI Brain 12/12 showed large remote right hemispheric infarct w/ encephalomalacia w/ prominent small vessel dis superimposed on atrophy... (residual deficits are diminished sensation on left side, left hand grip weakness,   . Senile dementia, uncomplicated   . Pernicious anemia   . Cataracts, bilateral   . Retinal hemorrhage of right eye   . Chronic renal insufficiency, stage III (moderate)     Borderline stage IV (CrCl around 30 ml/min)    Past Surgical History  Procedure Laterality Date  . Cholecystectomy    . Carotid endarterectomy    . Laser surgery on right eye  12/02/2012    Dr. Baird Cancer at [facility]    Outpatient Prescriptions Prior to Visit  Medication Sig Dispense Refill  . acetaminophen (TYLENOL) 500 MG tablet Take 2 tablet by mouth at bedtime    . atorvastatin (LIPITOR) 10 MG tablet TAKE 1 TABLET EVERY DAY 30 tablet 3  . cetirizine (ZYRTEC) 10 MG tablet Take 10 mg by mouth  daily.      . Cholecalciferol (EQL VITAMIN D3) 1000 UNITS tablet Take 1,000 Units by mouth daily.      . clopidogrel (PLAVIX) 75 MG tablet Take 75 mg by mouth daily.    . CVS RANITIDINE 75 MG tablet TAKE ONE TABLET BY MOUTH AT BEDTIME 30 tablet 9  . divalproex (DEPAKOTE SPRINKLE) 125 MG capsule TAKE 1 CAPSULE TWICE DAILY 60 capsule 5  . furosemide (LASIX) 20 MG tablet Take 1 tablet (20 mg total) by mouth daily. 30 tablet 6  . iron polysaccharides (NIFEREX) 150 MG capsule Take 150 mg by mouth daily.      Marland Kitchen loratadine (CLARITIN REDITABS) 10 MG dissolvable tablet Take 10 mg by mouth daily as needed for allergies.    . Multiple Vitamin (MULTIVITAMIN) capsule Take 1 capsule by mouth daily.      Marland Kitchen oxybutynin (DITROPAN) 5 MG tablet Take 1 tablet (5 mg total) by mouth 2 (two) times daily. 60 tablet 11  . sodium chloride (OCEAN) 0.65 % nasal spray Place 1 spray into the nose at bedtime.    . Urea in Zn Undecyl-Lactic Acid 45 % EMUL Apply to lower extremities once daily 240 mL 6  . vitamin C (ASCORBIC ACID) 500 MG tablet Take 500 mg by mouth daily. To take along with the iron tablet daily      No facility-administered medications prior to visit.    Allergies  Allergen Reactions  . Procaine Hcl  REACTION: pt states reaction to shot at dentist office w/ tachycardia \\T \ SOB (? if really from combination w/epi)    ROS As per HPI  PE: Blood pressure 160/60, pulse 59, temperature 97.8 F (36.6 C), temperature source Temporal, height 5\' 4"  (1.626 m), weight 136 lb (61.689 kg), SpO2 99 %. Gen: Alert, well appearing.  Patient is oriented to person, place, and situation. AFFECT: pleasant, lucid thought and speech. Pleasantly demented, very hard of hearing. CV: RRR, no m/r/g Chest is clear, no wheezing or rales. Normal symmetric air entry throughout both lung fields. No chest wall deformities or tenderness. EXT: 3+ pitting edema in both LL's from 2 inches below knees down into feet.  No rash, no skin  breakdown.  LABS:  None today  IMPRESSION AND PLAN:  Encounter for f/u chronic illnesses prior to entry into NH:  Stable dementia, white coat HTN, LE venous insufficiency edema. TB skin test today. Vit B12 1000 mcg IM for pernicious anemia--routine injection today. FL2 form reviewed and filled out. She'll return in 48-72h to have TB skin test read.  An After Visit Summary was printed and given to the patient.  Spent 25 min with pt today, with >50% of this time spent in counseling and care coordination regarding the FL 2 info and above problems.  FOLLOW UP: Return in about 3 months (around 05/31/2014) for routine chronic illness f/u; needs nurse visit in 48-72h to read TB test.

## 2014-03-02 ENCOUNTER — Ambulatory Visit: Payer: Medicare Other | Admitting: Family Medicine

## 2014-03-02 ENCOUNTER — Telehealth: Payer: Self-pay | Admitting: Family Medicine

## 2014-03-02 LAB — TB SKIN TEST
Induration: 0 mm
TB Skin Test: NEGATIVE

## 2014-03-02 MED ORDER — DIVALPROEX SODIUM 125 MG PO CPSP: ORAL_CAPSULE | ORAL | Status: DC

## 2014-03-02 NOTE — Telephone Encounter (Signed)
RX request for depakote.  Please advise.

## 2014-03-02 NOTE — Telephone Encounter (Signed)
Pharm calling to check on the status of refill request for divalproex(depakote).Carrie Grimes

## 2014-03-02 NOTE — Telephone Encounter (Signed)
Depakote rx sent to [facility].

## 2014-03-04 ENCOUNTER — Ambulatory Visit: Payer: Medicare Other | Admitting: Family Medicine

## 2014-03-07 ENCOUNTER — Other Ambulatory Visit: Payer: Self-pay | Admitting: Family Medicine

## 2014-03-07 MED ORDER — RANITIDINE HCL 75 MG PO TABS: ORAL_TABLET | ORAL | Status: DC

## 2014-04-07 ENCOUNTER — Ambulatory Visit (INDEPENDENT_AMBULATORY_CARE_PROVIDER_SITE_OTHER): Payer: Medicare Other | Admitting: *Deleted

## 2014-04-07 DIAGNOSIS — E538 Deficiency of other specified B group vitamins: Secondary | ICD-10-CM

## 2014-04-07 MED ORDER — CYANOCOBALAMIN 1000 MCG/ML IJ SOLN
1000.0000 ug | Freq: Once | INTRAMUSCULAR | Status: AC
  Administered 2014-04-07: 1000 ug via INTRAMUSCULAR

## 2014-04-15 ENCOUNTER — Telehealth: Payer: Self-pay | Admitting: Family Medicine

## 2014-04-15 NOTE — Telephone Encounter (Signed)
Liji from [facility] called and they need verbal orders for [place]. Please call [phone]

## 2014-04-18 ENCOUNTER — Telehealth: Payer: Self-pay | Admitting: Family Medicine

## 2014-04-18 NOTE — Telephone Encounter (Signed)
See 04/15/2014 phone message. They need verbal orders for [name] to continue physical therapy.

## 2014-04-18 NOTE — Telephone Encounter (Signed)
[facility] called [facility] and gave verbal orders per Lattie Haw, Dr. Anitra Lauth CMA to continue physical therapy for Jacquelyn.

## 2014-04-18 NOTE — Telephone Encounter (Signed)
Okay per Dr. Anitra Lauth.

## 2014-04-30 ENCOUNTER — Other Ambulatory Visit: Payer: Self-pay | Admitting: Pulmonary Disease

## 2014-05-02 ENCOUNTER — Other Ambulatory Visit: Payer: Self-pay | Admitting: Family Medicine

## 2014-05-02 MED ORDER — CLOPIDOGREL BISULFATE 75 MG PO TABS: 75.0000 mg | ORAL_TABLET | Freq: Every day | ORAL | Status: DC

## 2014-05-03 DIAGNOSIS — R2689 Other abnormalities of gait and mobility: Secondary | ICD-10-CM | POA: Diagnosis not present

## 2014-05-03 DIAGNOSIS — I129 Hypertensive chronic kidney disease with stage 1 through stage 4 chronic kidney disease, or unspecified chronic kidney disease: Secondary | ICD-10-CM | POA: Diagnosis not present

## 2014-05-03 DIAGNOSIS — D510 Vitamin B12 deficiency anemia due to intrinsic factor deficiency: Secondary | ICD-10-CM | POA: Diagnosis not present

## 2014-05-03 DIAGNOSIS — Z9181 History of falling: Secondary | ICD-10-CM | POA: Diagnosis not present

## 2014-05-03 DIAGNOSIS — R600 Localized edema: Secondary | ICD-10-CM | POA: Diagnosis not present

## 2014-05-03 DIAGNOSIS — N183 Chronic kidney disease, stage 3 (moderate): Secondary | ICD-10-CM | POA: Diagnosis not present

## 2014-05-03 DIAGNOSIS — I872 Venous insufficiency (chronic) (peripheral): Secondary | ICD-10-CM | POA: Diagnosis not present

## 2014-05-03 DIAGNOSIS — I69354 Hemiplegia and hemiparesis following cerebral infarction affecting left non-dominant side: Secondary | ICD-10-CM | POA: Diagnosis not present

## 2014-05-03 DIAGNOSIS — F0390 Unspecified dementia without behavioral disturbance: Secondary | ICD-10-CM | POA: Diagnosis not present

## 2014-05-04 ENCOUNTER — Telehealth: Payer: Self-pay | Admitting: Family Medicine

## 2014-05-04 NOTE — Telephone Encounter (Signed)
[facility] called to let Dr. Anitra Lauth know that Carrie Grimes's BP was 170/70 over night. She also complained of left heel pain. Carrie Grimes

## 2014-05-04 NOTE — Telephone Encounter (Signed)
Please advise 

## 2014-05-05 DIAGNOSIS — N183 Chronic kidney disease, stage 3 (moderate): Secondary | ICD-10-CM | POA: Diagnosis not present

## 2014-05-05 DIAGNOSIS — I129 Hypertensive chronic kidney disease with stage 1 through stage 4 chronic kidney disease, or unspecified chronic kidney disease: Secondary | ICD-10-CM | POA: Diagnosis not present

## 2014-05-05 DIAGNOSIS — R2689 Other abnormalities of gait and mobility: Secondary | ICD-10-CM | POA: Diagnosis not present

## 2014-05-05 DIAGNOSIS — R600 Localized edema: Secondary | ICD-10-CM | POA: Diagnosis not present

## 2014-05-05 DIAGNOSIS — F0390 Unspecified dementia without behavioral disturbance: Secondary | ICD-10-CM | POA: Diagnosis not present

## 2014-05-05 DIAGNOSIS — D510 Vitamin B12 deficiency anemia due to intrinsic factor deficiency: Secondary | ICD-10-CM | POA: Diagnosis not present

## 2014-05-05 DIAGNOSIS — I69354 Hemiplegia and hemiparesis following cerebral infarction affecting left non-dominant side: Secondary | ICD-10-CM | POA: Diagnosis not present

## 2014-05-05 DIAGNOSIS — I872 Venous insufficiency (chronic) (peripheral): Secondary | ICD-10-CM | POA: Diagnosis not present

## 2014-05-05 DIAGNOSIS — Z9181 History of falling: Secondary | ICD-10-CM | POA: Diagnosis not present

## 2014-05-08 DIAGNOSIS — I69354 Hemiplegia and hemiparesis following cerebral infarction affecting left non-dominant side: Secondary | ICD-10-CM | POA: Diagnosis not present

## 2014-05-08 DIAGNOSIS — N183 Chronic kidney disease, stage 3 (moderate): Secondary | ICD-10-CM

## 2014-05-08 DIAGNOSIS — R600 Localized edema: Secondary | ICD-10-CM | POA: Diagnosis not present

## 2014-05-08 DIAGNOSIS — I129 Hypertensive chronic kidney disease with stage 1 through stage 4 chronic kidney disease, or unspecified chronic kidney disease: Secondary | ICD-10-CM | POA: Diagnosis not present

## 2014-05-09 DIAGNOSIS — I69354 Hemiplegia and hemiparesis following cerebral infarction affecting left non-dominant side: Secondary | ICD-10-CM | POA: Diagnosis not present

## 2014-05-09 DIAGNOSIS — I129 Hypertensive chronic kidney disease with stage 1 through stage 4 chronic kidney disease, or unspecified chronic kidney disease: Secondary | ICD-10-CM | POA: Diagnosis not present

## 2014-05-09 DIAGNOSIS — R600 Localized edema: Secondary | ICD-10-CM | POA: Diagnosis not present

## 2014-05-09 DIAGNOSIS — D510 Vitamin B12 deficiency anemia due to intrinsic factor deficiency: Secondary | ICD-10-CM | POA: Diagnosis not present

## 2014-05-09 DIAGNOSIS — I872 Venous insufficiency (chronic) (peripheral): Secondary | ICD-10-CM | POA: Diagnosis not present

## 2014-05-09 DIAGNOSIS — Z9181 History of falling: Secondary | ICD-10-CM | POA: Diagnosis not present

## 2014-05-09 DIAGNOSIS — R2689 Other abnormalities of gait and mobility: Secondary | ICD-10-CM | POA: Diagnosis not present

## 2014-05-09 DIAGNOSIS — F0390 Unspecified dementia without behavioral disturbance: Secondary | ICD-10-CM | POA: Diagnosis not present

## 2014-05-09 DIAGNOSIS — N183 Chronic kidney disease, stage 3 (moderate): Secondary | ICD-10-CM | POA: Diagnosis not present

## 2014-05-10 ENCOUNTER — Ambulatory Visit (INDEPENDENT_AMBULATORY_CARE_PROVIDER_SITE_OTHER): Payer: Medicare Other | Admitting: Family Medicine

## 2014-05-10 DIAGNOSIS — E538 Deficiency of other specified B group vitamins: Secondary | ICD-10-CM

## 2014-05-10 MED ORDER — CYANOCOBALAMIN 1000 MCG/ML IJ SOLN
1000.0000 ug | Freq: Once | INTRAMUSCULAR | Status: AC
  Administered 2014-05-10: 1000 ug via INTRAMUSCULAR

## 2014-05-11 DIAGNOSIS — F0390 Unspecified dementia without behavioral disturbance: Secondary | ICD-10-CM | POA: Diagnosis not present

## 2014-05-11 DIAGNOSIS — Z9181 History of falling: Secondary | ICD-10-CM | POA: Diagnosis not present

## 2014-05-11 DIAGNOSIS — R2689 Other abnormalities of gait and mobility: Secondary | ICD-10-CM | POA: Diagnosis not present

## 2014-05-11 DIAGNOSIS — R600 Localized edema: Secondary | ICD-10-CM | POA: Diagnosis not present

## 2014-05-11 DIAGNOSIS — N183 Chronic kidney disease, stage 3 (moderate): Secondary | ICD-10-CM | POA: Diagnosis not present

## 2014-05-11 DIAGNOSIS — I872 Venous insufficiency (chronic) (peripheral): Secondary | ICD-10-CM | POA: Diagnosis not present

## 2014-05-11 DIAGNOSIS — I69354 Hemiplegia and hemiparesis following cerebral infarction affecting left non-dominant side: Secondary | ICD-10-CM | POA: Diagnosis not present

## 2014-05-11 DIAGNOSIS — D510 Vitamin B12 deficiency anemia due to intrinsic factor deficiency: Secondary | ICD-10-CM | POA: Diagnosis not present

## 2014-05-11 DIAGNOSIS — I129 Hypertensive chronic kidney disease with stage 1 through stage 4 chronic kidney disease, or unspecified chronic kidney disease: Secondary | ICD-10-CM | POA: Diagnosis not present

## 2014-05-12 DIAGNOSIS — I872 Venous insufficiency (chronic) (peripheral): Secondary | ICD-10-CM | POA: Diagnosis not present

## 2014-05-12 DIAGNOSIS — R600 Localized edema: Secondary | ICD-10-CM | POA: Diagnosis not present

## 2014-05-12 DIAGNOSIS — I69354 Hemiplegia and hemiparesis following cerebral infarction affecting left non-dominant side: Secondary | ICD-10-CM | POA: Diagnosis not present

## 2014-05-12 DIAGNOSIS — N183 Chronic kidney disease, stage 3 (moderate): Secondary | ICD-10-CM | POA: Diagnosis not present

## 2014-05-12 DIAGNOSIS — I129 Hypertensive chronic kidney disease with stage 1 through stage 4 chronic kidney disease, or unspecified chronic kidney disease: Secondary | ICD-10-CM | POA: Diagnosis not present

## 2014-05-12 DIAGNOSIS — Z9181 History of falling: Secondary | ICD-10-CM | POA: Diagnosis not present

## 2014-05-12 DIAGNOSIS — F0390 Unspecified dementia without behavioral disturbance: Secondary | ICD-10-CM | POA: Diagnosis not present

## 2014-05-12 DIAGNOSIS — R2689 Other abnormalities of gait and mobility: Secondary | ICD-10-CM | POA: Diagnosis not present

## 2014-05-12 DIAGNOSIS — D510 Vitamin B12 deficiency anemia due to intrinsic factor deficiency: Secondary | ICD-10-CM | POA: Diagnosis not present

## 2014-05-16 DIAGNOSIS — R600 Localized edema: Secondary | ICD-10-CM | POA: Diagnosis not present

## 2014-05-16 DIAGNOSIS — F0390 Unspecified dementia without behavioral disturbance: Secondary | ICD-10-CM | POA: Diagnosis not present

## 2014-05-16 DIAGNOSIS — Z9181 History of falling: Secondary | ICD-10-CM | POA: Diagnosis not present

## 2014-05-16 DIAGNOSIS — I69354 Hemiplegia and hemiparesis following cerebral infarction affecting left non-dominant side: Secondary | ICD-10-CM | POA: Diagnosis not present

## 2014-05-16 DIAGNOSIS — I872 Venous insufficiency (chronic) (peripheral): Secondary | ICD-10-CM | POA: Diagnosis not present

## 2014-05-16 DIAGNOSIS — D510 Vitamin B12 deficiency anemia due to intrinsic factor deficiency: Secondary | ICD-10-CM | POA: Diagnosis not present

## 2014-05-16 DIAGNOSIS — R2689 Other abnormalities of gait and mobility: Secondary | ICD-10-CM | POA: Diagnosis not present

## 2014-05-16 DIAGNOSIS — N183 Chronic kidney disease, stage 3 (moderate): Secondary | ICD-10-CM | POA: Diagnosis not present

## 2014-05-16 DIAGNOSIS — I129 Hypertensive chronic kidney disease with stage 1 through stage 4 chronic kidney disease, or unspecified chronic kidney disease: Secondary | ICD-10-CM | POA: Diagnosis not present

## 2014-05-17 ENCOUNTER — Telehealth: Payer: Self-pay | Admitting: Family Medicine

## 2014-05-17 NOTE — Telephone Encounter (Signed)
Patient's daughter states her mother has had diarrhea for 2 days. She is requesting that our office contact [facility] as that they will not give her any medication without doctor's orders.

## 2014-05-17 NOTE — Telephone Encounter (Signed)
Please advise 

## 2014-05-18 DIAGNOSIS — F0390 Unspecified dementia without behavioral disturbance: Secondary | ICD-10-CM | POA: Diagnosis not present

## 2014-05-18 DIAGNOSIS — I69354 Hemiplegia and hemiparesis following cerebral infarction affecting left non-dominant side: Secondary | ICD-10-CM | POA: Diagnosis not present

## 2014-05-18 DIAGNOSIS — Z9181 History of falling: Secondary | ICD-10-CM | POA: Diagnosis not present

## 2014-05-18 DIAGNOSIS — I129 Hypertensive chronic kidney disease with stage 1 through stage 4 chronic kidney disease, or unspecified chronic kidney disease: Secondary | ICD-10-CM | POA: Diagnosis not present

## 2014-05-18 DIAGNOSIS — R600 Localized edema: Secondary | ICD-10-CM | POA: Diagnosis not present

## 2014-05-18 DIAGNOSIS — N183 Chronic kidney disease, stage 3 (moderate): Secondary | ICD-10-CM | POA: Diagnosis not present

## 2014-05-18 DIAGNOSIS — D510 Vitamin B12 deficiency anemia due to intrinsic factor deficiency: Secondary | ICD-10-CM | POA: Diagnosis not present

## 2014-05-18 DIAGNOSIS — R2689 Other abnormalities of gait and mobility: Secondary | ICD-10-CM | POA: Diagnosis not present

## 2014-05-18 DIAGNOSIS — I872 Venous insufficiency (chronic) (peripheral): Secondary | ICD-10-CM | POA: Diagnosis not present

## 2014-05-18 NOTE — Telephone Encounter (Signed)
Do we have a contact number for [facility]?  Or do we know which location she resides at?

## 2014-05-18 NOTE — Telephone Encounter (Signed)
Noted  

## 2014-05-18 NOTE — Telephone Encounter (Signed)
Pls contact [facility] and ask them what pt's symptoms are and what her latest vital signs are.-thx

## 2014-05-18 NOTE — Telephone Encounter (Signed)
Spoke to Carrie Grimes at [facility],  She stated that the nursing staff hasn't seen that patient with any abdominal problems or diarrhea.  There was a note about some diarrhea about a week ago but no current complaints of any kind.

## 2014-05-23 DIAGNOSIS — Z9181 History of falling: Secondary | ICD-10-CM | POA: Diagnosis not present

## 2014-05-23 DIAGNOSIS — I872 Venous insufficiency (chronic) (peripheral): Secondary | ICD-10-CM | POA: Diagnosis not present

## 2014-05-23 DIAGNOSIS — D510 Vitamin B12 deficiency anemia due to intrinsic factor deficiency: Secondary | ICD-10-CM | POA: Diagnosis not present

## 2014-05-23 DIAGNOSIS — F0390 Unspecified dementia without behavioral disturbance: Secondary | ICD-10-CM | POA: Diagnosis not present

## 2014-05-23 DIAGNOSIS — R600 Localized edema: Secondary | ICD-10-CM | POA: Diagnosis not present

## 2014-05-23 DIAGNOSIS — R2689 Other abnormalities of gait and mobility: Secondary | ICD-10-CM | POA: Diagnosis not present

## 2014-05-23 DIAGNOSIS — I129 Hypertensive chronic kidney disease with stage 1 through stage 4 chronic kidney disease, or unspecified chronic kidney disease: Secondary | ICD-10-CM | POA: Diagnosis not present

## 2014-05-23 DIAGNOSIS — N183 Chronic kidney disease, stage 3 (moderate): Secondary | ICD-10-CM | POA: Diagnosis not present

## 2014-05-23 DIAGNOSIS — I69354 Hemiplegia and hemiparesis following cerebral infarction affecting left non-dominant side: Secondary | ICD-10-CM | POA: Diagnosis not present

## 2014-05-26 DIAGNOSIS — R2689 Other abnormalities of gait and mobility: Secondary | ICD-10-CM | POA: Diagnosis not present

## 2014-05-26 DIAGNOSIS — I129 Hypertensive chronic kidney disease with stage 1 through stage 4 chronic kidney disease, or unspecified chronic kidney disease: Secondary | ICD-10-CM | POA: Diagnosis not present

## 2014-05-26 DIAGNOSIS — D510 Vitamin B12 deficiency anemia due to intrinsic factor deficiency: Secondary | ICD-10-CM | POA: Diagnosis not present

## 2014-05-26 DIAGNOSIS — N183 Chronic kidney disease, stage 3 (moderate): Secondary | ICD-10-CM | POA: Diagnosis not present

## 2014-05-26 DIAGNOSIS — F0390 Unspecified dementia without behavioral disturbance: Secondary | ICD-10-CM | POA: Diagnosis not present

## 2014-05-26 DIAGNOSIS — R600 Localized edema: Secondary | ICD-10-CM | POA: Diagnosis not present

## 2014-05-26 DIAGNOSIS — I69354 Hemiplegia and hemiparesis following cerebral infarction affecting left non-dominant side: Secondary | ICD-10-CM | POA: Diagnosis not present

## 2014-05-26 DIAGNOSIS — I872 Venous insufficiency (chronic) (peripheral): Secondary | ICD-10-CM | POA: Diagnosis not present

## 2014-05-26 DIAGNOSIS — Z9181 History of falling: Secondary | ICD-10-CM | POA: Diagnosis not present

## 2014-05-30 DIAGNOSIS — F0390 Unspecified dementia without behavioral disturbance: Secondary | ICD-10-CM | POA: Diagnosis not present

## 2014-05-30 DIAGNOSIS — D510 Vitamin B12 deficiency anemia due to intrinsic factor deficiency: Secondary | ICD-10-CM | POA: Diagnosis not present

## 2014-05-30 DIAGNOSIS — R278 Other lack of coordination: Secondary | ICD-10-CM | POA: Diagnosis not present

## 2014-05-30 DIAGNOSIS — N183 Chronic kidney disease, stage 3 (moderate): Secondary | ICD-10-CM | POA: Diagnosis not present

## 2014-05-30 DIAGNOSIS — Z9181 History of falling: Secondary | ICD-10-CM | POA: Diagnosis not present

## 2014-05-30 DIAGNOSIS — I872 Venous insufficiency (chronic) (peripheral): Secondary | ICD-10-CM | POA: Diagnosis not present

## 2014-05-30 DIAGNOSIS — I69354 Hemiplegia and hemiparesis following cerebral infarction affecting left non-dominant side: Secondary | ICD-10-CM | POA: Diagnosis not present

## 2014-05-30 DIAGNOSIS — I129 Hypertensive chronic kidney disease with stage 1 through stage 4 chronic kidney disease, or unspecified chronic kidney disease: Secondary | ICD-10-CM | POA: Diagnosis not present

## 2014-05-31 DIAGNOSIS — F0390 Unspecified dementia without behavioral disturbance: Secondary | ICD-10-CM | POA: Diagnosis not present

## 2014-05-31 DIAGNOSIS — I129 Hypertensive chronic kidney disease with stage 1 through stage 4 chronic kidney disease, or unspecified chronic kidney disease: Secondary | ICD-10-CM | POA: Diagnosis not present

## 2014-05-31 DIAGNOSIS — Z9181 History of falling: Secondary | ICD-10-CM | POA: Diagnosis not present

## 2014-05-31 DIAGNOSIS — I872 Venous insufficiency (chronic) (peripheral): Secondary | ICD-10-CM | POA: Diagnosis not present

## 2014-05-31 DIAGNOSIS — N183 Chronic kidney disease, stage 3 (moderate): Secondary | ICD-10-CM | POA: Diagnosis not present

## 2014-05-31 DIAGNOSIS — I69354 Hemiplegia and hemiparesis following cerebral infarction affecting left non-dominant side: Secondary | ICD-10-CM | POA: Diagnosis not present

## 2014-05-31 DIAGNOSIS — D510 Vitamin B12 deficiency anemia due to intrinsic factor deficiency: Secondary | ICD-10-CM | POA: Diagnosis not present

## 2014-05-31 DIAGNOSIS — R278 Other lack of coordination: Secondary | ICD-10-CM | POA: Diagnosis not present

## 2014-06-02 DIAGNOSIS — I872 Venous insufficiency (chronic) (peripheral): Secondary | ICD-10-CM | POA: Diagnosis not present

## 2014-06-02 DIAGNOSIS — I69354 Hemiplegia and hemiparesis following cerebral infarction affecting left non-dominant side: Secondary | ICD-10-CM | POA: Diagnosis not present

## 2014-06-02 DIAGNOSIS — D510 Vitamin B12 deficiency anemia due to intrinsic factor deficiency: Secondary | ICD-10-CM | POA: Diagnosis not present

## 2014-06-02 DIAGNOSIS — I129 Hypertensive chronic kidney disease with stage 1 through stage 4 chronic kidney disease, or unspecified chronic kidney disease: Secondary | ICD-10-CM | POA: Diagnosis not present

## 2014-06-02 DIAGNOSIS — N183 Chronic kidney disease, stage 3 (moderate): Secondary | ICD-10-CM | POA: Diagnosis not present

## 2014-06-02 DIAGNOSIS — R278 Other lack of coordination: Secondary | ICD-10-CM | POA: Diagnosis not present

## 2014-06-02 DIAGNOSIS — Z9181 History of falling: Secondary | ICD-10-CM | POA: Diagnosis not present

## 2014-06-02 DIAGNOSIS — F0390 Unspecified dementia without behavioral disturbance: Secondary | ICD-10-CM | POA: Diagnosis not present

## 2014-06-06 ENCOUNTER — Telehealth: Payer: Self-pay | Admitting: Family Medicine

## 2014-06-06 NOTE — Telephone Encounter (Signed)
Okay per Dr. Anitra Lauth.  PT aware.

## 2014-06-06 NOTE — Telephone Encounter (Signed)
Opal Sidles is the Physical Therapist for Carrie Grimes. She is requesting verbal orders for her to be evaluated by [facility] Therapy to evaluate her on Feb. 10./dh

## 2014-06-07 ENCOUNTER — Telehealth: Payer: Self-pay | Admitting: Family Medicine

## 2014-06-07 DIAGNOSIS — D510 Vitamin B12 deficiency anemia due to intrinsic factor deficiency: Secondary | ICD-10-CM | POA: Diagnosis not present

## 2014-06-07 DIAGNOSIS — R278 Other lack of coordination: Secondary | ICD-10-CM | POA: Diagnosis not present

## 2014-06-07 DIAGNOSIS — I129 Hypertensive chronic kidney disease with stage 1 through stage 4 chronic kidney disease, or unspecified chronic kidney disease: Secondary | ICD-10-CM | POA: Diagnosis not present

## 2014-06-07 DIAGNOSIS — N183 Chronic kidney disease, stage 3 (moderate): Secondary | ICD-10-CM | POA: Diagnosis not present

## 2014-06-07 DIAGNOSIS — I872 Venous insufficiency (chronic) (peripheral): Secondary | ICD-10-CM | POA: Diagnosis not present

## 2014-06-07 DIAGNOSIS — Z9181 History of falling: Secondary | ICD-10-CM | POA: Diagnosis not present

## 2014-06-07 DIAGNOSIS — I69354 Hemiplegia and hemiparesis following cerebral infarction affecting left non-dominant side: Secondary | ICD-10-CM | POA: Diagnosis not present

## 2014-06-07 DIAGNOSIS — F0390 Unspecified dementia without behavioral disturbance: Secondary | ICD-10-CM | POA: Diagnosis not present

## 2014-06-07 NOTE — Telephone Encounter (Signed)
Order faxed.   Correction on the fax number.  It is [phone].

## 2014-06-07 NOTE — Telephone Encounter (Signed)
Order written on paper rx and ready to fax.

## 2014-06-07 NOTE — Telephone Encounter (Signed)
[facility] employee called requesting we fax an order for an audiologist to [phone] if possible.  Please advise.

## 2014-06-08 DIAGNOSIS — N183 Chronic kidney disease, stage 3 (moderate): Secondary | ICD-10-CM | POA: Diagnosis not present

## 2014-06-08 DIAGNOSIS — R278 Other lack of coordination: Secondary | ICD-10-CM | POA: Diagnosis not present

## 2014-06-08 DIAGNOSIS — Z9181 History of falling: Secondary | ICD-10-CM | POA: Diagnosis not present

## 2014-06-08 DIAGNOSIS — D510 Vitamin B12 deficiency anemia due to intrinsic factor deficiency: Secondary | ICD-10-CM | POA: Diagnosis not present

## 2014-06-08 DIAGNOSIS — I872 Venous insufficiency (chronic) (peripheral): Secondary | ICD-10-CM | POA: Diagnosis not present

## 2014-06-08 DIAGNOSIS — I129 Hypertensive chronic kidney disease with stage 1 through stage 4 chronic kidney disease, or unspecified chronic kidney disease: Secondary | ICD-10-CM | POA: Diagnosis not present

## 2014-06-08 DIAGNOSIS — I69354 Hemiplegia and hemiparesis following cerebral infarction affecting left non-dominant side: Secondary | ICD-10-CM | POA: Diagnosis not present

## 2014-06-08 DIAGNOSIS — F0390 Unspecified dementia without behavioral disturbance: Secondary | ICD-10-CM | POA: Diagnosis not present

## 2014-06-09 ENCOUNTER — Ambulatory Visit (INDEPENDENT_AMBULATORY_CARE_PROVIDER_SITE_OTHER): Payer: Medicare Other | Admitting: Family Medicine

## 2014-06-09 ENCOUNTER — Encounter: Payer: Self-pay | Admitting: Family Medicine

## 2014-06-09 VITALS — BP 194/71 | Temp 99.2°F | Resp 18 | Ht 64.0 in | Wt 129.0 lb

## 2014-06-09 DIAGNOSIS — I10 Essential (primary) hypertension: Secondary | ICD-10-CM | POA: Diagnosis not present

## 2014-06-09 DIAGNOSIS — D510 Vitamin B12 deficiency anemia due to intrinsic factor deficiency: Secondary | ICD-10-CM | POA: Diagnosis not present

## 2014-06-09 DIAGNOSIS — M79674 Pain in right toe(s): Secondary | ICD-10-CM | POA: Diagnosis not present

## 2014-06-09 DIAGNOSIS — Z79899 Other long term (current) drug therapy: Secondary | ICD-10-CM

## 2014-06-09 DIAGNOSIS — I69354 Hemiplegia and hemiparesis following cerebral infarction affecting left non-dominant side: Secondary | ICD-10-CM | POA: Diagnosis not present

## 2014-06-09 DIAGNOSIS — Z9181 History of falling: Secondary | ICD-10-CM | POA: Diagnosis not present

## 2014-06-09 DIAGNOSIS — M79675 Pain in left toe(s): Secondary | ICD-10-CM | POA: Diagnosis not present

## 2014-06-09 DIAGNOSIS — N183 Chronic kidney disease, stage 3 unspecified: Secondary | ICD-10-CM

## 2014-06-09 DIAGNOSIS — E785 Hyperlipidemia, unspecified: Secondary | ICD-10-CM | POA: Diagnosis not present

## 2014-06-09 DIAGNOSIS — R197 Diarrhea, unspecified: Secondary | ICD-10-CM

## 2014-06-09 DIAGNOSIS — I872 Venous insufficiency (chronic) (peripheral): Secondary | ICD-10-CM | POA: Diagnosis not present

## 2014-06-09 DIAGNOSIS — F03918 Unspecified dementia, unspecified severity, with other behavioral disturbance: Secondary | ICD-10-CM

## 2014-06-09 DIAGNOSIS — F0390 Unspecified dementia without behavioral disturbance: Secondary | ICD-10-CM | POA: Diagnosis not present

## 2014-06-09 DIAGNOSIS — B351 Tinea unguium: Secondary | ICD-10-CM | POA: Diagnosis not present

## 2014-06-09 DIAGNOSIS — I129 Hypertensive chronic kidney disease with stage 1 through stage 4 chronic kidney disease, or unspecified chronic kidney disease: Secondary | ICD-10-CM | POA: Diagnosis not present

## 2014-06-09 DIAGNOSIS — F0391 Unspecified dementia with behavioral disturbance: Secondary | ICD-10-CM

## 2014-06-09 DIAGNOSIS — R634 Abnormal weight loss: Secondary | ICD-10-CM

## 2014-06-09 DIAGNOSIS — E538 Deficiency of other specified B group vitamins: Secondary | ICD-10-CM

## 2014-06-09 DIAGNOSIS — R278 Other lack of coordination: Secondary | ICD-10-CM | POA: Diagnosis not present

## 2014-06-09 MED ORDER — FLUCONAZOLE 100 MG PO TABS: ORAL_TABLET | ORAL | Status: DC

## 2014-06-09 MED ORDER — CYANOCOBALAMIN 1000 MCG/ML IJ SOLN
1000.0000 ug | Freq: Once | INTRAMUSCULAR | Status: AC
  Administered 2014-06-09: 1000 ug via INTRAMUSCULAR

## 2014-06-09 MED ORDER — AMLODIPINE BESYLATE 5 MG PO TABS: 5.0000 mg | ORAL_TABLET | Freq: Every day | ORAL | Status: DC

## 2014-06-09 NOTE — Progress Notes (Signed)
Pre visit review using our clinic review tool, if applicable. No additional management support is needed unless otherwise documented below in the visit note. 

## 2014-06-09 NOTE — Progress Notes (Signed)
OFFICE NOTE  06/12/2014  CC:  Chief Complaint  Patient presents with  . Follow-up    HPI: Patient is a 81 y.o. Caucasian female who is here for 3 mo f/u HTN, dementia with behav disturbance, CRI, LE venous insufficiency edema. Has apparently been having diarrhea lately but difficult to get accurate history--has been about 10d or so.  She has had some abnormal weight loss, suspected to be secondary to having only a few teeth/trouble chewing, hx of some swallowing dysfunction so she has had to eat slow and ends up getting fewer calories than typical meal intake. Question of some wt loss due to the recent diarrhea.    Review of bp's from this month at [facility]: avg 145/75, HR 60s.    Her dementia/behavior/mood seems to be stable.  Her legs are feeling much improved on a daily dose of lasix and use of urea-based topical formula on hyperkeratotic skin of lower legs.     Pertinent PMH:  Past medical, surgical, social, and family history reviewed and no changes are noted since last office visit.  MEDS:  Outpatient Prescriptions Prior to Visit  Medication Sig Dispense Refill  . atorvastatin (LIPITOR) 10 MG tablet TAKE 1 TABLET EVERY DAY 30 tablet 3  . cetirizine (ZYRTEC) 10 MG tablet Take 10 mg by mouth daily.      . clopidogrel (PLAVIX) 75 MG tablet Take 1 tablet (75 mg total) by mouth daily. 30 tablet 3  . divalproex (DEPAKOTE SPRINKLE) 125 MG capsule TAKE 1 CAPSULE TWICE DAILY 60 capsule 5  . furosemide (LASIX) 20 MG tablet Take 1 tablet (20 mg total) by mouth daily. 30 tablet 6  . iron polysaccharides (NIFEREX) 150 MG capsule Take 150 mg by mouth daily.      . Multiple Vitamin (MULTIVITAMIN) capsule Take 1 capsule by mouth daily.      Marland Kitchen oxybutynin (DITROPAN) 5 MG tablet Take 1 tablet (5 mg total) by mouth 2 (two) times daily. (Patient taking differently: Take 5 mg by mouth 2 (two) times daily. Taking once daily (06/09/14)) 60 tablet 11  . ranitidine (CVS RANITIDINE) 75 MG tablet TAKE ONE  TABLET BY MOUTH AT BEDTIME 30 tablet 3  . sodium chloride (OCEAN) 0.65 % nasal spray Place 1 spray into the nose at bedtime.    . Urea in Zn Undecyl-Lactic Acid 45 % EMUL Apply to lower extremities once daily 240 mL 6  . vitamin C (ASCORBIC ACID) 500 MG tablet Take 500 mg by mouth daily. To take along with the iron tablet daily     . acetaminophen (TYLENOL) 500 MG tablet Take 2 tablet by mouth at bedtime    . Cholecalciferol (EQL VITAMIN D3) 1000 UNITS tablet Take 1,000 Units by mouth daily.      Marland Kitchen loratadine (CLARITIN REDITABS) 10 MG dissolvable tablet Take 10 mg by mouth daily as needed for allergies.     No facility-administered medications prior to visit.    PE: Blood pressure 194/71, temperature 99.2 F (37.3 C), temperature source Temporal, resp. rate 18, height 5\' 4"  (1.626 m), weight 129 lb (58.514 kg), SpO2 100 %. Gen: alert, HOH but attends well.  Oriented to person and place. ENT: she has only 3-4 teeth and these are eroded/broken.  No gingival swelling, no tenderness in mouth. Tongue has thick, dry, yellowish brown coating adherent to it. CV: RRR, no m/r/g.   LUNGS: CTA bilat, nonlabored resps, good aeration in all lung fields. ABD: soft, NT/ND EXT: 2+ pitting edema from mid  tibia level down into feet, L>R slightly, hyperkeratotic skin on tops of feet distal aspect+ toes.  No skin breakdown or areas of erythema.  LABS: none today RECENT:   Chemistry      Component Value Date/Time   NA 143 06/09/2014 1514   K 4.8 06/09/2014 1514   CL 109 06/09/2014 1514   CO2 28 06/09/2014 1514   BUN 66* 06/09/2014 1514   CREATININE 2.03* 06/09/2014 1514      Component Value Date/Time   CALCIUM 9.6 06/09/2014 1514   ALKPHOS 102 06/09/2014 1514   AST 13 06/09/2014 1514   ALT 10 06/09/2014 1514   BILITOT 0.3 06/09/2014 1514     Lab Results  Component Value Date   CHOL 133 12/02/2012   HDL 42.60 12/02/2012   LDLCALC 64 12/02/2012   LDLDIRECT 74.7 01/29/2011   TRIG 132.0  12/02/2012   CHOLHDL 3 12/02/2012   Lab Results  Component Value Date   TSH 0.81 12/02/2012   Lab Results  Component Value Date   WBC 5.5 06/09/2014   HGB 9.8* 06/09/2014   HCT 28.8* 06/09/2014   MCV 92.3 06/09/2014   PLT 147.0* 06/09/2014    IMPRESSION AND PLAN:  1) Diarrhea; duration and amount unclear. Check stool sample for bacterial clx, rota, giar/cryp, c diff pcr, fecal lactoferrin. Will check CMET and CBC as well.  2) Dementia with behav disturb: stable.  High risk med use (depakote): due for cbc and cmet monitoring.  3) CRI, stage III: lytes/cr today.    4) HTN: stage 1 range at NH but stage II systolic here.  No meds currently: will start amlodipine 5mg  qd.  5) Hx of CVA: try to control bp better (added med today), continue statin and plavix.  6) LE venous insufficiency edema: stable.  Continue lasix 20mg  qd as long as kidney function stable.    7) Hyperlipidemia; LDL 64 August 2014.  Difficult to get pt to come in for fasting appt in order to monitor lipids. Continue current statin/dosing.  8) Abnormal wt loss: from April 2015 to now she is down from 144 to 129 lbs.  Multifactorial: poor dentition, swallowing difficulties+dementia make the eating process slow and inefficient, recent diarrhea.  Start ensure 1 can per day.     9) Thrush: diflucan rx'd.  Spent 60 min with pt today, with >50% of this time spent in counseling and care coordination regarding the above problems.  An After Visit Summary was printed and given to the patient.  FOLLOW UP: 3 mo

## 2014-06-10 ENCOUNTER — Telehealth: Payer: Self-pay | Admitting: Family Medicine

## 2014-06-10 LAB — CBC WITH DIFFERENTIAL/PLATELET
Basophils Absolute: 0 10*3/uL (ref 0.0–0.1)
Basophils Relative: 0.4 % (ref 0.0–3.0)
EOS ABS: 0.2 10*3/uL (ref 0.0–0.7)
Eosinophils Relative: 3.2 % (ref 0.0–5.0)
HCT: 28.8 % — ABNORMAL LOW (ref 36.0–46.0)
Hemoglobin: 9.8 g/dL — ABNORMAL LOW (ref 12.0–15.0)
LYMPHS ABS: 1.5 10*3/uL (ref 0.7–4.0)
Lymphocytes Relative: 27 % (ref 12.0–46.0)
MCHC: 34 g/dL (ref 30.0–36.0)
MCV: 92.3 fl (ref 78.0–100.0)
MONO ABS: 0.5 10*3/uL (ref 0.1–1.0)
Monocytes Relative: 8.6 % (ref 3.0–12.0)
NEUTROS PCT: 60.8 % (ref 43.0–77.0)
Neutro Abs: 3.4 10*3/uL (ref 1.4–7.7)
Platelets: 147 10*3/uL — ABNORMAL LOW (ref 150.0–400.0)
RBC: 3.13 Mil/uL — ABNORMAL LOW (ref 3.87–5.11)
RDW: 14.2 % (ref 11.5–15.5)
WBC: 5.5 10*3/uL (ref 4.0–10.5)

## 2014-06-10 LAB — COMPREHENSIVE METABOLIC PANEL
ALBUMIN: 4 g/dL (ref 3.5–5.2)
ALK PHOS: 102 U/L (ref 39–117)
ALT: 10 U/L (ref 0–35)
AST: 13 U/L (ref 0–37)
BUN: 66 mg/dL — ABNORMAL HIGH (ref 6–23)
CALCIUM: 9.6 mg/dL (ref 8.4–10.5)
CHLORIDE: 109 meq/L (ref 96–112)
CO2: 28 meq/L (ref 19–32)
Creatinine, Ser: 2.03 mg/dL — ABNORMAL HIGH (ref 0.40–1.20)
GFR: 24.35 mL/min — ABNORMAL LOW (ref >60.00)
GLUCOSE: 98 mg/dL (ref 70–99)
POTASSIUM: 4.8 meq/L (ref 3.5–5.1)
Sodium: 143 mEq/L (ref 135–145)
TOTAL PROTEIN: 6.9 g/dL (ref 6.0–8.3)
Total Bilirubin: 0.3 mg/dL (ref 0.2–1.2)

## 2014-06-10 NOTE — Telephone Encounter (Signed)
emmi emailed °

## 2014-06-12 ENCOUNTER — Encounter: Payer: Self-pay | Admitting: Family Medicine

## 2014-06-14 DIAGNOSIS — Z9181 History of falling: Secondary | ICD-10-CM | POA: Diagnosis not present

## 2014-06-14 DIAGNOSIS — N183 Chronic kidney disease, stage 3 (moderate): Secondary | ICD-10-CM | POA: Diagnosis not present

## 2014-06-14 DIAGNOSIS — R197 Diarrhea, unspecified: Secondary | ICD-10-CM | POA: Diagnosis not present

## 2014-06-14 DIAGNOSIS — I129 Hypertensive chronic kidney disease with stage 1 through stage 4 chronic kidney disease, or unspecified chronic kidney disease: Secondary | ICD-10-CM | POA: Diagnosis not present

## 2014-06-14 DIAGNOSIS — D510 Vitamin B12 deficiency anemia due to intrinsic factor deficiency: Secondary | ICD-10-CM | POA: Diagnosis not present

## 2014-06-14 DIAGNOSIS — I872 Venous insufficiency (chronic) (peripheral): Secondary | ICD-10-CM | POA: Diagnosis not present

## 2014-06-14 DIAGNOSIS — F0390 Unspecified dementia without behavioral disturbance: Secondary | ICD-10-CM | POA: Diagnosis not present

## 2014-06-14 DIAGNOSIS — I69354 Hemiplegia and hemiparesis following cerebral infarction affecting left non-dominant side: Secondary | ICD-10-CM | POA: Diagnosis not present

## 2014-06-14 DIAGNOSIS — R278 Other lack of coordination: Secondary | ICD-10-CM | POA: Diagnosis not present

## 2014-06-15 DIAGNOSIS — R278 Other lack of coordination: Secondary | ICD-10-CM | POA: Diagnosis not present

## 2014-06-15 DIAGNOSIS — D510 Vitamin B12 deficiency anemia due to intrinsic factor deficiency: Secondary | ICD-10-CM | POA: Diagnosis not present

## 2014-06-15 DIAGNOSIS — I129 Hypertensive chronic kidney disease with stage 1 through stage 4 chronic kidney disease, or unspecified chronic kidney disease: Secondary | ICD-10-CM | POA: Diagnosis not present

## 2014-06-15 DIAGNOSIS — I69354 Hemiplegia and hemiparesis following cerebral infarction affecting left non-dominant side: Secondary | ICD-10-CM | POA: Diagnosis not present

## 2014-06-15 DIAGNOSIS — Z9181 History of falling: Secondary | ICD-10-CM | POA: Diagnosis not present

## 2014-06-15 DIAGNOSIS — N183 Chronic kidney disease, stage 3 (moderate): Secondary | ICD-10-CM | POA: Diagnosis not present

## 2014-06-15 DIAGNOSIS — F0390 Unspecified dementia without behavioral disturbance: Secondary | ICD-10-CM | POA: Diagnosis not present

## 2014-06-15 DIAGNOSIS — I872 Venous insufficiency (chronic) (peripheral): Secondary | ICD-10-CM | POA: Diagnosis not present

## 2014-06-17 DIAGNOSIS — I129 Hypertensive chronic kidney disease with stage 1 through stage 4 chronic kidney disease, or unspecified chronic kidney disease: Secondary | ICD-10-CM | POA: Diagnosis not present

## 2014-06-17 DIAGNOSIS — I872 Venous insufficiency (chronic) (peripheral): Secondary | ICD-10-CM | POA: Diagnosis not present

## 2014-06-17 DIAGNOSIS — Z9181 History of falling: Secondary | ICD-10-CM | POA: Diagnosis not present

## 2014-06-17 DIAGNOSIS — F0390 Unspecified dementia without behavioral disturbance: Secondary | ICD-10-CM | POA: Diagnosis not present

## 2014-06-17 DIAGNOSIS — I69354 Hemiplegia and hemiparesis following cerebral infarction affecting left non-dominant side: Secondary | ICD-10-CM | POA: Diagnosis not present

## 2014-06-17 DIAGNOSIS — N183 Chronic kidney disease, stage 3 (moderate): Secondary | ICD-10-CM | POA: Diagnosis not present

## 2014-06-17 DIAGNOSIS — D510 Vitamin B12 deficiency anemia due to intrinsic factor deficiency: Secondary | ICD-10-CM | POA: Diagnosis not present

## 2014-06-17 DIAGNOSIS — R278 Other lack of coordination: Secondary | ICD-10-CM | POA: Diagnosis not present

## 2014-06-20 ENCOUNTER — Telehealth: Payer: Self-pay | Admitting: Family Medicine

## 2014-06-20 DIAGNOSIS — R278 Other lack of coordination: Secondary | ICD-10-CM | POA: Diagnosis not present

## 2014-06-20 DIAGNOSIS — I129 Hypertensive chronic kidney disease with stage 1 through stage 4 chronic kidney disease, or unspecified chronic kidney disease: Secondary | ICD-10-CM | POA: Diagnosis not present

## 2014-06-20 DIAGNOSIS — Z9181 History of falling: Secondary | ICD-10-CM | POA: Diagnosis not present

## 2014-06-20 DIAGNOSIS — I69354 Hemiplegia and hemiparesis following cerebral infarction affecting left non-dominant side: Secondary | ICD-10-CM | POA: Diagnosis not present

## 2014-06-20 DIAGNOSIS — I872 Venous insufficiency (chronic) (peripheral): Secondary | ICD-10-CM | POA: Diagnosis not present

## 2014-06-20 DIAGNOSIS — N183 Chronic kidney disease, stage 3 (moderate): Secondary | ICD-10-CM | POA: Diagnosis not present

## 2014-06-20 DIAGNOSIS — D510 Vitamin B12 deficiency anemia due to intrinsic factor deficiency: Secondary | ICD-10-CM | POA: Diagnosis not present

## 2014-06-20 DIAGNOSIS — F0390 Unspecified dementia without behavioral disturbance: Secondary | ICD-10-CM | POA: Diagnosis not present

## 2014-06-20 NOTE — Telephone Encounter (Signed)
Please disregard. [facility] called back stating they believe it was just gas and to hold off on pain meds.

## 2014-06-20 NOTE — Telephone Encounter (Signed)
[facility] called and is requesting PRN pain meds. Carrie Grimes is complaining of back and shoulder pain.

## 2014-06-20 NOTE — Telephone Encounter (Signed)
Please advise 

## 2014-06-21 DIAGNOSIS — N183 Chronic kidney disease, stage 3 (moderate): Secondary | ICD-10-CM | POA: Diagnosis not present

## 2014-06-21 DIAGNOSIS — I69354 Hemiplegia and hemiparesis following cerebral infarction affecting left non-dominant side: Secondary | ICD-10-CM | POA: Diagnosis not present

## 2014-06-21 DIAGNOSIS — D510 Vitamin B12 deficiency anemia due to intrinsic factor deficiency: Secondary | ICD-10-CM | POA: Diagnosis not present

## 2014-06-21 DIAGNOSIS — Z9181 History of falling: Secondary | ICD-10-CM | POA: Diagnosis not present

## 2014-06-21 DIAGNOSIS — F0390 Unspecified dementia without behavioral disturbance: Secondary | ICD-10-CM | POA: Diagnosis not present

## 2014-06-21 DIAGNOSIS — I872 Venous insufficiency (chronic) (peripheral): Secondary | ICD-10-CM | POA: Diagnosis not present

## 2014-06-21 DIAGNOSIS — I129 Hypertensive chronic kidney disease with stage 1 through stage 4 chronic kidney disease, or unspecified chronic kidney disease: Secondary | ICD-10-CM | POA: Diagnosis not present

## 2014-06-21 DIAGNOSIS — R278 Other lack of coordination: Secondary | ICD-10-CM | POA: Diagnosis not present

## 2014-06-21 NOTE — Telephone Encounter (Signed)
Noted  

## 2014-06-22 DIAGNOSIS — I69354 Hemiplegia and hemiparesis following cerebral infarction affecting left non-dominant side: Secondary | ICD-10-CM | POA: Diagnosis not present

## 2014-06-23 DIAGNOSIS — I872 Venous insufficiency (chronic) (peripheral): Secondary | ICD-10-CM | POA: Diagnosis not present

## 2014-06-23 DIAGNOSIS — N183 Chronic kidney disease, stage 3 (moderate): Secondary | ICD-10-CM | POA: Diagnosis not present

## 2014-06-23 DIAGNOSIS — F0390 Unspecified dementia without behavioral disturbance: Secondary | ICD-10-CM | POA: Diagnosis not present

## 2014-06-23 DIAGNOSIS — R278 Other lack of coordination: Secondary | ICD-10-CM | POA: Diagnosis not present

## 2014-06-23 DIAGNOSIS — D510 Vitamin B12 deficiency anemia due to intrinsic factor deficiency: Secondary | ICD-10-CM | POA: Diagnosis not present

## 2014-06-23 DIAGNOSIS — I69354 Hemiplegia and hemiparesis following cerebral infarction affecting left non-dominant side: Secondary | ICD-10-CM | POA: Diagnosis not present

## 2014-06-23 DIAGNOSIS — I129 Hypertensive chronic kidney disease with stage 1 through stage 4 chronic kidney disease, or unspecified chronic kidney disease: Secondary | ICD-10-CM | POA: Diagnosis not present

## 2014-06-23 DIAGNOSIS — Z9181 History of falling: Secondary | ICD-10-CM | POA: Diagnosis not present

## 2014-06-24 DIAGNOSIS — R278 Other lack of coordination: Secondary | ICD-10-CM | POA: Diagnosis not present

## 2014-06-24 DIAGNOSIS — D510 Vitamin B12 deficiency anemia due to intrinsic factor deficiency: Secondary | ICD-10-CM | POA: Diagnosis not present

## 2014-06-24 DIAGNOSIS — I872 Venous insufficiency (chronic) (peripheral): Secondary | ICD-10-CM | POA: Diagnosis not present

## 2014-06-24 DIAGNOSIS — I69354 Hemiplegia and hemiparesis following cerebral infarction affecting left non-dominant side: Secondary | ICD-10-CM | POA: Diagnosis not present

## 2014-06-24 DIAGNOSIS — I129 Hypertensive chronic kidney disease with stage 1 through stage 4 chronic kidney disease, or unspecified chronic kidney disease: Secondary | ICD-10-CM | POA: Diagnosis not present

## 2014-06-24 DIAGNOSIS — Z9181 History of falling: Secondary | ICD-10-CM | POA: Diagnosis not present

## 2014-06-24 DIAGNOSIS — N183 Chronic kidney disease, stage 3 (moderate): Secondary | ICD-10-CM | POA: Diagnosis not present

## 2014-06-24 DIAGNOSIS — F0390 Unspecified dementia without behavioral disturbance: Secondary | ICD-10-CM | POA: Diagnosis not present

## 2014-06-28 ENCOUNTER — Telehealth: Payer: Self-pay | Admitting: Family Medicine

## 2014-06-28 DIAGNOSIS — F0390 Unspecified dementia without behavioral disturbance: Secondary | ICD-10-CM | POA: Diagnosis not present

## 2014-06-28 DIAGNOSIS — I129 Hypertensive chronic kidney disease with stage 1 through stage 4 chronic kidney disease, or unspecified chronic kidney disease: Secondary | ICD-10-CM | POA: Diagnosis not present

## 2014-06-28 DIAGNOSIS — Z9181 History of falling: Secondary | ICD-10-CM | POA: Diagnosis not present

## 2014-06-28 DIAGNOSIS — N183 Chronic kidney disease, stage 3 (moderate): Secondary | ICD-10-CM | POA: Diagnosis not present

## 2014-06-28 DIAGNOSIS — I872 Venous insufficiency (chronic) (peripheral): Secondary | ICD-10-CM | POA: Diagnosis not present

## 2014-06-28 DIAGNOSIS — I69354 Hemiplegia and hemiparesis following cerebral infarction affecting left non-dominant side: Secondary | ICD-10-CM | POA: Diagnosis not present

## 2014-06-28 DIAGNOSIS — R278 Other lack of coordination: Secondary | ICD-10-CM | POA: Diagnosis not present

## 2014-06-28 DIAGNOSIS — D510 Vitamin B12 deficiency anemia due to intrinsic factor deficiency: Secondary | ICD-10-CM | POA: Diagnosis not present

## 2014-06-28 NOTE — Telephone Encounter (Signed)
Opened duplicate chart.

## 2014-06-28 NOTE — Telephone Encounter (Signed)
Pt's home care facility lmom stating pt's pulse is 54 and she has 2 + edema in her left leg and 1 + edema in her right leg.  Please advise.

## 2014-06-28 NOTE — Telephone Encounter (Signed)
Her edema is chronic and that pulse is fine.   No new orders.

## 2014-06-28 NOTE — Telephone Encounter (Signed)
Care taker notified.  She asked if you would like to change her parameters.  They are supposed to notify PCP if her pulse is under 60 or above 120.  PLease let me know if you'd like to change those numbers.

## 2014-06-29 NOTE — Telephone Encounter (Signed)
Call if pulse under 45 or over 130.-thx

## 2014-06-29 NOTE — Telephone Encounter (Signed)
Caregiver aware.

## 2014-06-30 DIAGNOSIS — I129 Hypertensive chronic kidney disease with stage 1 through stage 4 chronic kidney disease, or unspecified chronic kidney disease: Secondary | ICD-10-CM | POA: Diagnosis not present

## 2014-06-30 DIAGNOSIS — Z9181 History of falling: Secondary | ICD-10-CM | POA: Diagnosis not present

## 2014-06-30 DIAGNOSIS — I872 Venous insufficiency (chronic) (peripheral): Secondary | ICD-10-CM | POA: Diagnosis not present

## 2014-06-30 DIAGNOSIS — F0390 Unspecified dementia without behavioral disturbance: Secondary | ICD-10-CM | POA: Diagnosis not present

## 2014-06-30 DIAGNOSIS — R278 Other lack of coordination: Secondary | ICD-10-CM | POA: Diagnosis not present

## 2014-06-30 DIAGNOSIS — N183 Chronic kidney disease, stage 3 (moderate): Secondary | ICD-10-CM | POA: Diagnosis not present

## 2014-06-30 DIAGNOSIS — D510 Vitamin B12 deficiency anemia due to intrinsic factor deficiency: Secondary | ICD-10-CM | POA: Diagnosis not present

## 2014-06-30 DIAGNOSIS — I69354 Hemiplegia and hemiparesis following cerebral infarction affecting left non-dominant side: Secondary | ICD-10-CM | POA: Diagnosis not present

## 2014-07-02 DIAGNOSIS — F0390 Unspecified dementia without behavioral disturbance: Secondary | ICD-10-CM | POA: Diagnosis not present

## 2014-07-02 DIAGNOSIS — N183 Chronic kidney disease, stage 3 (moderate): Secondary | ICD-10-CM | POA: Diagnosis not present

## 2014-07-02 DIAGNOSIS — Z9181 History of falling: Secondary | ICD-10-CM | POA: Diagnosis not present

## 2014-07-02 DIAGNOSIS — I129 Hypertensive chronic kidney disease with stage 1 through stage 4 chronic kidney disease, or unspecified chronic kidney disease: Secondary | ICD-10-CM | POA: Diagnosis not present

## 2014-07-02 DIAGNOSIS — I69354 Hemiplegia and hemiparesis following cerebral infarction affecting left non-dominant side: Secondary | ICD-10-CM | POA: Diagnosis not present

## 2014-07-02 DIAGNOSIS — R278 Other lack of coordination: Secondary | ICD-10-CM | POA: Diagnosis not present

## 2014-07-02 DIAGNOSIS — I872 Venous insufficiency (chronic) (peripheral): Secondary | ICD-10-CM | POA: Diagnosis not present

## 2014-07-02 DIAGNOSIS — D510 Vitamin B12 deficiency anemia due to intrinsic factor deficiency: Secondary | ICD-10-CM | POA: Diagnosis not present

## 2014-07-05 DIAGNOSIS — N183 Chronic kidney disease, stage 3 (moderate): Secondary | ICD-10-CM | POA: Diagnosis not present

## 2014-07-05 DIAGNOSIS — Z9181 History of falling: Secondary | ICD-10-CM | POA: Diagnosis not present

## 2014-07-05 DIAGNOSIS — I69354 Hemiplegia and hemiparesis following cerebral infarction affecting left non-dominant side: Secondary | ICD-10-CM | POA: Diagnosis not present

## 2014-07-05 DIAGNOSIS — I872 Venous insufficiency (chronic) (peripheral): Secondary | ICD-10-CM | POA: Diagnosis not present

## 2014-07-05 DIAGNOSIS — D510 Vitamin B12 deficiency anemia due to intrinsic factor deficiency: Secondary | ICD-10-CM | POA: Diagnosis not present

## 2014-07-05 DIAGNOSIS — R278 Other lack of coordination: Secondary | ICD-10-CM | POA: Diagnosis not present

## 2014-07-05 DIAGNOSIS — F0390 Unspecified dementia without behavioral disturbance: Secondary | ICD-10-CM | POA: Diagnosis not present

## 2014-07-05 DIAGNOSIS — I129 Hypertensive chronic kidney disease with stage 1 through stage 4 chronic kidney disease, or unspecified chronic kidney disease: Secondary | ICD-10-CM | POA: Diagnosis not present

## 2014-07-06 DIAGNOSIS — Z9181 History of falling: Secondary | ICD-10-CM | POA: Diagnosis not present

## 2014-07-06 DIAGNOSIS — I129 Hypertensive chronic kidney disease with stage 1 through stage 4 chronic kidney disease, or unspecified chronic kidney disease: Secondary | ICD-10-CM | POA: Diagnosis not present

## 2014-07-06 DIAGNOSIS — N183 Chronic kidney disease, stage 3 (moderate): Secondary | ICD-10-CM | POA: Diagnosis not present

## 2014-07-06 DIAGNOSIS — R278 Other lack of coordination: Secondary | ICD-10-CM | POA: Diagnosis not present

## 2014-07-06 DIAGNOSIS — I872 Venous insufficiency (chronic) (peripheral): Secondary | ICD-10-CM | POA: Diagnosis not present

## 2014-07-06 DIAGNOSIS — I69354 Hemiplegia and hemiparesis following cerebral infarction affecting left non-dominant side: Secondary | ICD-10-CM | POA: Diagnosis not present

## 2014-07-06 DIAGNOSIS — D510 Vitamin B12 deficiency anemia due to intrinsic factor deficiency: Secondary | ICD-10-CM | POA: Diagnosis not present

## 2014-07-06 DIAGNOSIS — F0390 Unspecified dementia without behavioral disturbance: Secondary | ICD-10-CM | POA: Diagnosis not present

## 2014-07-07 ENCOUNTER — Telehealth: Payer: Self-pay | Admitting: *Deleted

## 2014-07-07 ENCOUNTER — Ambulatory Visit (INDEPENDENT_AMBULATORY_CARE_PROVIDER_SITE_OTHER): Payer: Medicare Other | Admitting: *Deleted

## 2014-07-07 DIAGNOSIS — Z9181 History of falling: Secondary | ICD-10-CM | POA: Diagnosis not present

## 2014-07-07 DIAGNOSIS — E538 Deficiency of other specified B group vitamins: Secondary | ICD-10-CM | POA: Diagnosis not present

## 2014-07-07 DIAGNOSIS — I872 Venous insufficiency (chronic) (peripheral): Secondary | ICD-10-CM | POA: Diagnosis not present

## 2014-07-07 DIAGNOSIS — N183 Chronic kidney disease, stage 3 (moderate): Secondary | ICD-10-CM | POA: Diagnosis not present

## 2014-07-07 DIAGNOSIS — F0390 Unspecified dementia without behavioral disturbance: Secondary | ICD-10-CM | POA: Diagnosis not present

## 2014-07-07 DIAGNOSIS — D510 Vitamin B12 deficiency anemia due to intrinsic factor deficiency: Secondary | ICD-10-CM | POA: Diagnosis not present

## 2014-07-07 DIAGNOSIS — I69354 Hemiplegia and hemiparesis following cerebral infarction affecting left non-dominant side: Secondary | ICD-10-CM | POA: Diagnosis not present

## 2014-07-07 DIAGNOSIS — I129 Hypertensive chronic kidney disease with stage 1 through stage 4 chronic kidney disease, or unspecified chronic kidney disease: Secondary | ICD-10-CM | POA: Diagnosis not present

## 2014-07-07 DIAGNOSIS — R278 Other lack of coordination: Secondary | ICD-10-CM | POA: Diagnosis not present

## 2014-07-07 MED ORDER — CYANOCOBALAMIN 1000 MCG/ML IJ SOLN
1000.0000 ug | Freq: Once | INTRAMUSCULAR | Status: AC
Start: 2014-07-07 — End: 2014-07-07
  Administered 2014-07-07: 1000 ug via INTRAMUSCULAR

## 2014-07-07 NOTE — Telephone Encounter (Signed)
Patient was in the office for a b-12 injection today and requested a medication for diarrhea be sent to her group home? Also pt requested a new order for knee length support hose? Please advise?

## 2014-07-12 DIAGNOSIS — I69354 Hemiplegia and hemiparesis following cerebral infarction affecting left non-dominant side: Secondary | ICD-10-CM | POA: Diagnosis not present

## 2014-07-12 DIAGNOSIS — I872 Venous insufficiency (chronic) (peripheral): Secondary | ICD-10-CM | POA: Diagnosis not present

## 2014-07-12 DIAGNOSIS — N183 Chronic kidney disease, stage 3 (moderate): Secondary | ICD-10-CM | POA: Diagnosis not present

## 2014-07-12 DIAGNOSIS — Z9181 History of falling: Secondary | ICD-10-CM | POA: Diagnosis not present

## 2014-07-12 DIAGNOSIS — R278 Other lack of coordination: Secondary | ICD-10-CM | POA: Diagnosis not present

## 2014-07-12 DIAGNOSIS — F0390 Unspecified dementia without behavioral disturbance: Secondary | ICD-10-CM | POA: Diagnosis not present

## 2014-07-12 DIAGNOSIS — I129 Hypertensive chronic kidney disease with stage 1 through stage 4 chronic kidney disease, or unspecified chronic kidney disease: Secondary | ICD-10-CM | POA: Diagnosis not present

## 2014-07-12 DIAGNOSIS — D510 Vitamin B12 deficiency anemia due to intrinsic factor deficiency: Secondary | ICD-10-CM | POA: Diagnosis not present

## 2014-07-14 DIAGNOSIS — R278 Other lack of coordination: Secondary | ICD-10-CM | POA: Diagnosis not present

## 2014-07-14 DIAGNOSIS — I129 Hypertensive chronic kidney disease with stage 1 through stage 4 chronic kidney disease, or unspecified chronic kidney disease: Secondary | ICD-10-CM | POA: Diagnosis not present

## 2014-07-14 DIAGNOSIS — I872 Venous insufficiency (chronic) (peripheral): Secondary | ICD-10-CM | POA: Diagnosis not present

## 2014-07-14 DIAGNOSIS — D510 Vitamin B12 deficiency anemia due to intrinsic factor deficiency: Secondary | ICD-10-CM | POA: Diagnosis not present

## 2014-07-14 DIAGNOSIS — Z9181 History of falling: Secondary | ICD-10-CM | POA: Diagnosis not present

## 2014-07-14 DIAGNOSIS — N183 Chronic kidney disease, stage 3 (moderate): Secondary | ICD-10-CM | POA: Diagnosis not present

## 2014-07-14 DIAGNOSIS — F0390 Unspecified dementia without behavioral disturbance: Secondary | ICD-10-CM | POA: Diagnosis not present

## 2014-07-14 DIAGNOSIS — I69354 Hemiplegia and hemiparesis following cerebral infarction affecting left non-dominant side: Secondary | ICD-10-CM | POA: Diagnosis not present

## 2014-07-20 DIAGNOSIS — R278 Other lack of coordination: Secondary | ICD-10-CM | POA: Diagnosis not present

## 2014-07-20 DIAGNOSIS — I69354 Hemiplegia and hemiparesis following cerebral infarction affecting left non-dominant side: Secondary | ICD-10-CM | POA: Diagnosis not present

## 2014-07-20 DIAGNOSIS — N183 Chronic kidney disease, stage 3 (moderate): Secondary | ICD-10-CM | POA: Diagnosis not present

## 2014-07-20 DIAGNOSIS — F0390 Unspecified dementia without behavioral disturbance: Secondary | ICD-10-CM | POA: Diagnosis not present

## 2014-07-20 DIAGNOSIS — D510 Vitamin B12 deficiency anemia due to intrinsic factor deficiency: Secondary | ICD-10-CM | POA: Diagnosis not present

## 2014-07-20 DIAGNOSIS — Z9181 History of falling: Secondary | ICD-10-CM | POA: Diagnosis not present

## 2014-07-20 DIAGNOSIS — I872 Venous insufficiency (chronic) (peripheral): Secondary | ICD-10-CM | POA: Diagnosis not present

## 2014-07-20 DIAGNOSIS — I129 Hypertensive chronic kidney disease with stage 1 through stage 4 chronic kidney disease, or unspecified chronic kidney disease: Secondary | ICD-10-CM | POA: Diagnosis not present

## 2014-07-22 DIAGNOSIS — F0390 Unspecified dementia without behavioral disturbance: Secondary | ICD-10-CM | POA: Diagnosis not present

## 2014-07-22 DIAGNOSIS — D510 Vitamin B12 deficiency anemia due to intrinsic factor deficiency: Secondary | ICD-10-CM | POA: Diagnosis not present

## 2014-07-22 DIAGNOSIS — N183 Chronic kidney disease, stage 3 (moderate): Secondary | ICD-10-CM | POA: Diagnosis not present

## 2014-07-22 DIAGNOSIS — I872 Venous insufficiency (chronic) (peripheral): Secondary | ICD-10-CM | POA: Diagnosis not present

## 2014-07-22 DIAGNOSIS — I129 Hypertensive chronic kidney disease with stage 1 through stage 4 chronic kidney disease, or unspecified chronic kidney disease: Secondary | ICD-10-CM | POA: Diagnosis not present

## 2014-07-22 DIAGNOSIS — R278 Other lack of coordination: Secondary | ICD-10-CM | POA: Diagnosis not present

## 2014-07-22 DIAGNOSIS — I69354 Hemiplegia and hemiparesis following cerebral infarction affecting left non-dominant side: Secondary | ICD-10-CM | POA: Diagnosis not present

## 2014-07-22 DIAGNOSIS — Z9181 History of falling: Secondary | ICD-10-CM | POA: Diagnosis not present

## 2014-07-26 DIAGNOSIS — N183 Chronic kidney disease, stage 3 (moderate): Secondary | ICD-10-CM | POA: Diagnosis not present

## 2014-07-26 DIAGNOSIS — Z9181 History of falling: Secondary | ICD-10-CM | POA: Diagnosis not present

## 2014-07-26 DIAGNOSIS — R278 Other lack of coordination: Secondary | ICD-10-CM | POA: Diagnosis not present

## 2014-07-26 DIAGNOSIS — I872 Venous insufficiency (chronic) (peripheral): Secondary | ICD-10-CM | POA: Diagnosis not present

## 2014-07-26 DIAGNOSIS — F0390 Unspecified dementia without behavioral disturbance: Secondary | ICD-10-CM | POA: Diagnosis not present

## 2014-07-26 DIAGNOSIS — I129 Hypertensive chronic kidney disease with stage 1 through stage 4 chronic kidney disease, or unspecified chronic kidney disease: Secondary | ICD-10-CM | POA: Diagnosis not present

## 2014-07-26 DIAGNOSIS — I69354 Hemiplegia and hemiparesis following cerebral infarction affecting left non-dominant side: Secondary | ICD-10-CM | POA: Diagnosis not present

## 2014-07-26 DIAGNOSIS — D510 Vitamin B12 deficiency anemia due to intrinsic factor deficiency: Secondary | ICD-10-CM | POA: Diagnosis not present

## 2014-07-28 DIAGNOSIS — I872 Venous insufficiency (chronic) (peripheral): Secondary | ICD-10-CM | POA: Diagnosis not present

## 2014-07-28 DIAGNOSIS — Z9181 History of falling: Secondary | ICD-10-CM | POA: Diagnosis not present

## 2014-07-28 DIAGNOSIS — F0390 Unspecified dementia without behavioral disturbance: Secondary | ICD-10-CM | POA: Diagnosis not present

## 2014-07-28 DIAGNOSIS — D510 Vitamin B12 deficiency anemia due to intrinsic factor deficiency: Secondary | ICD-10-CM | POA: Diagnosis not present

## 2014-07-28 DIAGNOSIS — I69354 Hemiplegia and hemiparesis following cerebral infarction affecting left non-dominant side: Secondary | ICD-10-CM | POA: Diagnosis not present

## 2014-07-28 DIAGNOSIS — R278 Other lack of coordination: Secondary | ICD-10-CM | POA: Diagnosis not present

## 2014-07-28 DIAGNOSIS — I129 Hypertensive chronic kidney disease with stage 1 through stage 4 chronic kidney disease, or unspecified chronic kidney disease: Secondary | ICD-10-CM | POA: Diagnosis not present

## 2014-07-28 DIAGNOSIS — N183 Chronic kidney disease, stage 3 (moderate): Secondary | ICD-10-CM | POA: Diagnosis not present

## 2014-08-01 ENCOUNTER — Telehealth: Payer: Self-pay | Admitting: Family Medicine

## 2014-08-01 NOTE — Telephone Encounter (Signed)
Pt's daughter called stating pt was summons for one week of criminal jury duty.  Can you please write a note to dismiss her of this and fax it for [phone] [employer] clerk.

## 2014-08-02 ENCOUNTER — Encounter: Payer: Self-pay | Admitting: Family Medicine

## 2014-08-02 DIAGNOSIS — I69354 Hemiplegia and hemiparesis following cerebral infarction affecting left non-dominant side: Secondary | ICD-10-CM | POA: Diagnosis not present

## 2014-08-02 DIAGNOSIS — D510 Vitamin B12 deficiency anemia due to intrinsic factor deficiency: Secondary | ICD-10-CM | POA: Diagnosis not present

## 2014-08-02 DIAGNOSIS — N183 Chronic kidney disease, stage 3 (moderate): Secondary | ICD-10-CM | POA: Diagnosis not present

## 2014-08-02 DIAGNOSIS — R278 Other lack of coordination: Secondary | ICD-10-CM | POA: Diagnosis not present

## 2014-08-02 DIAGNOSIS — I872 Venous insufficiency (chronic) (peripheral): Secondary | ICD-10-CM | POA: Diagnosis not present

## 2014-08-02 DIAGNOSIS — Z9181 History of falling: Secondary | ICD-10-CM | POA: Diagnosis not present

## 2014-08-02 DIAGNOSIS — F0390 Unspecified dementia without behavioral disturbance: Secondary | ICD-10-CM | POA: Diagnosis not present

## 2014-08-02 DIAGNOSIS — I129 Hypertensive chronic kidney disease with stage 1 through stage 4 chronic kidney disease, or unspecified chronic kidney disease: Secondary | ICD-10-CM | POA: Diagnosis not present

## 2014-08-02 NOTE — Telephone Encounter (Signed)
Letter done and ready for fax.

## 2014-08-02 NOTE — Telephone Encounter (Signed)
Jury duty letter faxed.

## 2014-08-04 DIAGNOSIS — Z9181 History of falling: Secondary | ICD-10-CM | POA: Diagnosis not present

## 2014-08-04 DIAGNOSIS — F0390 Unspecified dementia without behavioral disturbance: Secondary | ICD-10-CM | POA: Diagnosis not present

## 2014-08-04 DIAGNOSIS — I69354 Hemiplegia and hemiparesis following cerebral infarction affecting left non-dominant side: Secondary | ICD-10-CM | POA: Diagnosis not present

## 2014-08-04 DIAGNOSIS — N183 Chronic kidney disease, stage 3 (moderate): Secondary | ICD-10-CM | POA: Diagnosis not present

## 2014-08-04 DIAGNOSIS — D510 Vitamin B12 deficiency anemia due to intrinsic factor deficiency: Secondary | ICD-10-CM | POA: Diagnosis not present

## 2014-08-04 DIAGNOSIS — R278 Other lack of coordination: Secondary | ICD-10-CM | POA: Diagnosis not present

## 2014-08-04 DIAGNOSIS — I872 Venous insufficiency (chronic) (peripheral): Secondary | ICD-10-CM | POA: Diagnosis not present

## 2014-08-04 DIAGNOSIS — I129 Hypertensive chronic kidney disease with stage 1 through stage 4 chronic kidney disease, or unspecified chronic kidney disease: Secondary | ICD-10-CM | POA: Diagnosis not present

## 2014-08-09 ENCOUNTER — Ambulatory Visit (INDEPENDENT_AMBULATORY_CARE_PROVIDER_SITE_OTHER): Payer: Medicare Other | Admitting: *Deleted

## 2014-08-09 DIAGNOSIS — E538 Deficiency of other specified B group vitamins: Secondary | ICD-10-CM | POA: Diagnosis not present

## 2014-08-09 MED ORDER — CYANOCOBALAMIN 1000 MCG/ML IJ SOLN
1000.0000 ug | Freq: Once | INTRAMUSCULAR | Status: AC
  Administered 2014-08-09: 1000 ug via INTRAMUSCULAR

## 2014-08-10 DIAGNOSIS — I129 Hypertensive chronic kidney disease with stage 1 through stage 4 chronic kidney disease, or unspecified chronic kidney disease: Secondary | ICD-10-CM | POA: Diagnosis not present

## 2014-08-10 DIAGNOSIS — Z9181 History of falling: Secondary | ICD-10-CM | POA: Diagnosis not present

## 2014-08-10 DIAGNOSIS — N183 Chronic kidney disease, stage 3 (moderate): Secondary | ICD-10-CM | POA: Diagnosis not present

## 2014-08-10 DIAGNOSIS — F0390 Unspecified dementia without behavioral disturbance: Secondary | ICD-10-CM | POA: Diagnosis not present

## 2014-08-10 DIAGNOSIS — I872 Venous insufficiency (chronic) (peripheral): Secondary | ICD-10-CM | POA: Diagnosis not present

## 2014-08-10 DIAGNOSIS — R278 Other lack of coordination: Secondary | ICD-10-CM | POA: Diagnosis not present

## 2014-08-10 DIAGNOSIS — I69354 Hemiplegia and hemiparesis following cerebral infarction affecting left non-dominant side: Secondary | ICD-10-CM | POA: Diagnosis not present

## 2014-08-10 DIAGNOSIS — D510 Vitamin B12 deficiency anemia due to intrinsic factor deficiency: Secondary | ICD-10-CM | POA: Diagnosis not present

## 2014-08-15 DIAGNOSIS — I129 Hypertensive chronic kidney disease with stage 1 through stage 4 chronic kidney disease, or unspecified chronic kidney disease: Secondary | ICD-10-CM | POA: Diagnosis not present

## 2014-08-15 DIAGNOSIS — N183 Chronic kidney disease, stage 3 (moderate): Secondary | ICD-10-CM | POA: Diagnosis not present

## 2014-08-15 DIAGNOSIS — Z9181 History of falling: Secondary | ICD-10-CM | POA: Diagnosis not present

## 2014-08-15 DIAGNOSIS — D510 Vitamin B12 deficiency anemia due to intrinsic factor deficiency: Secondary | ICD-10-CM | POA: Diagnosis not present

## 2014-08-15 DIAGNOSIS — F0390 Unspecified dementia without behavioral disturbance: Secondary | ICD-10-CM | POA: Diagnosis not present

## 2014-08-15 DIAGNOSIS — I69354 Hemiplegia and hemiparesis following cerebral infarction affecting left non-dominant side: Secondary | ICD-10-CM | POA: Diagnosis not present

## 2014-08-15 DIAGNOSIS — R278 Other lack of coordination: Secondary | ICD-10-CM | POA: Diagnosis not present

## 2014-08-15 DIAGNOSIS — I872 Venous insufficiency (chronic) (peripheral): Secondary | ICD-10-CM | POA: Diagnosis not present

## 2014-08-18 DIAGNOSIS — I872 Venous insufficiency (chronic) (peripheral): Secondary | ICD-10-CM | POA: Diagnosis not present

## 2014-08-18 DIAGNOSIS — Z9181 History of falling: Secondary | ICD-10-CM | POA: Diagnosis not present

## 2014-08-18 DIAGNOSIS — I69354 Hemiplegia and hemiparesis following cerebral infarction affecting left non-dominant side: Secondary | ICD-10-CM | POA: Diagnosis not present

## 2014-08-18 DIAGNOSIS — R278 Other lack of coordination: Secondary | ICD-10-CM | POA: Diagnosis not present

## 2014-08-18 DIAGNOSIS — F0390 Unspecified dementia without behavioral disturbance: Secondary | ICD-10-CM | POA: Diagnosis not present

## 2014-08-18 DIAGNOSIS — N183 Chronic kidney disease, stage 3 (moderate): Secondary | ICD-10-CM | POA: Diagnosis not present

## 2014-08-18 DIAGNOSIS — D510 Vitamin B12 deficiency anemia due to intrinsic factor deficiency: Secondary | ICD-10-CM | POA: Diagnosis not present

## 2014-08-18 DIAGNOSIS — I129 Hypertensive chronic kidney disease with stage 1 through stage 4 chronic kidney disease, or unspecified chronic kidney disease: Secondary | ICD-10-CM | POA: Diagnosis not present

## 2014-08-22 DIAGNOSIS — N183 Chronic kidney disease, stage 3 (moderate): Secondary | ICD-10-CM | POA: Diagnosis not present

## 2014-08-22 DIAGNOSIS — I129 Hypertensive chronic kidney disease with stage 1 through stage 4 chronic kidney disease, or unspecified chronic kidney disease: Secondary | ICD-10-CM | POA: Diagnosis not present

## 2014-08-22 DIAGNOSIS — D510 Vitamin B12 deficiency anemia due to intrinsic factor deficiency: Secondary | ICD-10-CM | POA: Diagnosis not present

## 2014-08-22 DIAGNOSIS — R278 Other lack of coordination: Secondary | ICD-10-CM | POA: Diagnosis not present

## 2014-08-22 DIAGNOSIS — I69354 Hemiplegia and hemiparesis following cerebral infarction affecting left non-dominant side: Secondary | ICD-10-CM | POA: Diagnosis not present

## 2014-08-22 DIAGNOSIS — F0390 Unspecified dementia without behavioral disturbance: Secondary | ICD-10-CM | POA: Diagnosis not present

## 2014-08-22 DIAGNOSIS — I872 Venous insufficiency (chronic) (peripheral): Secondary | ICD-10-CM | POA: Diagnosis not present

## 2014-08-22 DIAGNOSIS — Z9181 History of falling: Secondary | ICD-10-CM | POA: Diagnosis not present

## 2014-08-25 DIAGNOSIS — F0390 Unspecified dementia without behavioral disturbance: Secondary | ICD-10-CM | POA: Diagnosis not present

## 2014-08-25 DIAGNOSIS — I872 Venous insufficiency (chronic) (peripheral): Secondary | ICD-10-CM | POA: Diagnosis not present

## 2014-08-25 DIAGNOSIS — Z9181 History of falling: Secondary | ICD-10-CM | POA: Diagnosis not present

## 2014-08-25 DIAGNOSIS — N183 Chronic kidney disease, stage 3 (moderate): Secondary | ICD-10-CM | POA: Diagnosis not present

## 2014-08-25 DIAGNOSIS — R278 Other lack of coordination: Secondary | ICD-10-CM | POA: Diagnosis not present

## 2014-08-25 DIAGNOSIS — I69354 Hemiplegia and hemiparesis following cerebral infarction affecting left non-dominant side: Secondary | ICD-10-CM | POA: Diagnosis not present

## 2014-08-25 DIAGNOSIS — D510 Vitamin B12 deficiency anemia due to intrinsic factor deficiency: Secondary | ICD-10-CM | POA: Diagnosis not present

## 2014-08-25 DIAGNOSIS — I129 Hypertensive chronic kidney disease with stage 1 through stage 4 chronic kidney disease, or unspecified chronic kidney disease: Secondary | ICD-10-CM | POA: Diagnosis not present

## 2014-08-29 DIAGNOSIS — I872 Venous insufficiency (chronic) (peripheral): Secondary | ICD-10-CM | POA: Diagnosis not present

## 2014-08-29 DIAGNOSIS — D510 Vitamin B12 deficiency anemia due to intrinsic factor deficiency: Secondary | ICD-10-CM | POA: Diagnosis not present

## 2014-08-29 DIAGNOSIS — N183 Chronic kidney disease, stage 3 (moderate): Secondary | ICD-10-CM | POA: Diagnosis not present

## 2014-08-29 DIAGNOSIS — I69354 Hemiplegia and hemiparesis following cerebral infarction affecting left non-dominant side: Secondary | ICD-10-CM | POA: Diagnosis not present

## 2014-08-29 DIAGNOSIS — I129 Hypertensive chronic kidney disease with stage 1 through stage 4 chronic kidney disease, or unspecified chronic kidney disease: Secondary | ICD-10-CM | POA: Diagnosis not present

## 2014-08-29 DIAGNOSIS — F0390 Unspecified dementia without behavioral disturbance: Secondary | ICD-10-CM | POA: Diagnosis not present

## 2014-08-29 DIAGNOSIS — Z9181 History of falling: Secondary | ICD-10-CM | POA: Diagnosis not present

## 2014-08-29 DIAGNOSIS — M6281 Muscle weakness (generalized): Secondary | ICD-10-CM | POA: Diagnosis not present

## 2014-09-01 DIAGNOSIS — I69354 Hemiplegia and hemiparesis following cerebral infarction affecting left non-dominant side: Secondary | ICD-10-CM | POA: Diagnosis not present

## 2014-09-01 DIAGNOSIS — I129 Hypertensive chronic kidney disease with stage 1 through stage 4 chronic kidney disease, or unspecified chronic kidney disease: Secondary | ICD-10-CM | POA: Diagnosis not present

## 2014-09-01 DIAGNOSIS — N183 Chronic kidney disease, stage 3 (moderate): Secondary | ICD-10-CM | POA: Diagnosis not present

## 2014-09-01 DIAGNOSIS — F0390 Unspecified dementia without behavioral disturbance: Secondary | ICD-10-CM | POA: Diagnosis not present

## 2014-09-01 DIAGNOSIS — D510 Vitamin B12 deficiency anemia due to intrinsic factor deficiency: Secondary | ICD-10-CM | POA: Diagnosis not present

## 2014-09-01 DIAGNOSIS — I872 Venous insufficiency (chronic) (peripheral): Secondary | ICD-10-CM | POA: Diagnosis not present

## 2014-09-01 DIAGNOSIS — Z9181 History of falling: Secondary | ICD-10-CM | POA: Diagnosis not present

## 2014-09-01 DIAGNOSIS — M6281 Muscle weakness (generalized): Secondary | ICD-10-CM | POA: Diagnosis not present

## 2014-09-05 DIAGNOSIS — D510 Vitamin B12 deficiency anemia due to intrinsic factor deficiency: Secondary | ICD-10-CM | POA: Diagnosis not present

## 2014-09-05 DIAGNOSIS — I69354 Hemiplegia and hemiparesis following cerebral infarction affecting left non-dominant side: Secondary | ICD-10-CM | POA: Diagnosis not present

## 2014-09-05 DIAGNOSIS — Z9181 History of falling: Secondary | ICD-10-CM | POA: Diagnosis not present

## 2014-09-05 DIAGNOSIS — F0390 Unspecified dementia without behavioral disturbance: Secondary | ICD-10-CM | POA: Diagnosis not present

## 2014-09-05 DIAGNOSIS — N183 Chronic kidney disease, stage 3 (moderate): Secondary | ICD-10-CM | POA: Diagnosis not present

## 2014-09-05 DIAGNOSIS — I872 Venous insufficiency (chronic) (peripheral): Secondary | ICD-10-CM | POA: Diagnosis not present

## 2014-09-05 DIAGNOSIS — M6281 Muscle weakness (generalized): Secondary | ICD-10-CM | POA: Diagnosis not present

## 2014-09-05 DIAGNOSIS — I129 Hypertensive chronic kidney disease with stage 1 through stage 4 chronic kidney disease, or unspecified chronic kidney disease: Secondary | ICD-10-CM | POA: Diagnosis not present

## 2014-09-07 DIAGNOSIS — I129 Hypertensive chronic kidney disease with stage 1 through stage 4 chronic kidney disease, or unspecified chronic kidney disease: Secondary | ICD-10-CM | POA: Diagnosis not present

## 2014-09-07 DIAGNOSIS — I872 Venous insufficiency (chronic) (peripheral): Secondary | ICD-10-CM | POA: Diagnosis not present

## 2014-09-07 DIAGNOSIS — F0390 Unspecified dementia without behavioral disturbance: Secondary | ICD-10-CM | POA: Diagnosis not present

## 2014-09-07 DIAGNOSIS — Z9181 History of falling: Secondary | ICD-10-CM | POA: Diagnosis not present

## 2014-09-07 DIAGNOSIS — D510 Vitamin B12 deficiency anemia due to intrinsic factor deficiency: Secondary | ICD-10-CM | POA: Diagnosis not present

## 2014-09-07 DIAGNOSIS — N183 Chronic kidney disease, stage 3 (moderate): Secondary | ICD-10-CM | POA: Diagnosis not present

## 2014-09-07 DIAGNOSIS — M6281 Muscle weakness (generalized): Secondary | ICD-10-CM | POA: Diagnosis not present

## 2014-09-07 DIAGNOSIS — I69354 Hemiplegia and hemiparesis following cerebral infarction affecting left non-dominant side: Secondary | ICD-10-CM | POA: Diagnosis not present

## 2014-09-08 ENCOUNTER — Encounter: Payer: Self-pay | Admitting: Family Medicine

## 2014-09-08 ENCOUNTER — Ambulatory Visit: Payer: Medicare Other

## 2014-09-08 ENCOUNTER — Ambulatory Visit (INDEPENDENT_AMBULATORY_CARE_PROVIDER_SITE_OTHER): Payer: Medicare Other | Admitting: Family Medicine

## 2014-09-08 VITALS — BP 150/75 | HR 59 | Temp 97.9°F | Resp 16 | Wt 128.0 lb

## 2014-09-08 DIAGNOSIS — D510 Vitamin B12 deficiency anemia due to intrinsic factor deficiency: Secondary | ICD-10-CM | POA: Diagnosis not present

## 2014-09-08 DIAGNOSIS — I872 Venous insufficiency (chronic) (peripheral): Secondary | ICD-10-CM | POA: Diagnosis not present

## 2014-09-08 DIAGNOSIS — N184 Chronic kidney disease, stage 4 (severe): Secondary | ICD-10-CM

## 2014-09-08 DIAGNOSIS — F0391 Unspecified dementia with behavioral disturbance: Secondary | ICD-10-CM

## 2014-09-08 DIAGNOSIS — Z79899 Other long term (current) drug therapy: Secondary | ICD-10-CM | POA: Diagnosis not present

## 2014-09-08 DIAGNOSIS — F03918 Unspecified dementia, unspecified severity, with other behavioral disturbance: Secondary | ICD-10-CM

## 2014-09-08 DIAGNOSIS — D649 Anemia, unspecified: Secondary | ICD-10-CM

## 2014-09-08 DIAGNOSIS — K529 Noninfective gastroenteritis and colitis, unspecified: Secondary | ICD-10-CM | POA: Diagnosis not present

## 2014-09-08 MED ORDER — CYANOCOBALAMIN 1000 MCG/ML IJ SOLN
1000.0000 ug | Freq: Once | INTRAMUSCULAR | Status: AC
  Administered 2014-09-08: 1000 ug via INTRAMUSCULAR

## 2014-09-08 NOTE — Patient Instructions (Signed)
Stop atorvastatin and Niferex (iron).

## 2014-09-08 NOTE — Progress Notes (Signed)
OFFICE NOTE  09/08/2014  CC:  Chief Complaint  Patient presents with  . Follow-up    3 month follow up    HPI: Patient is a 81 y.o. Caucasian female who is here for 3 mo f/u dementia w/behavioral disturbance, HTN, LE venous insufficiency, and abnormal wt loss, pernicious anemia.  Her wt is only 1 lb down from last visit! Has been having chronic diarrhea still, very tough to get stool sample at her ALF.  It is impossible to tell any details about her diarrhea.  In fact, it has been impossible to even verify that she is HAVING diarrhea. She has not been given any anti-diarrhea meds.  No abdominal pains, no n/v.  Needs order from me to start getting vit B12 IM at her ALF.  Runny nose and sore throat lately, no fever.  No cough or SOB.  Lots of gas and burps a lot.  Reviewed bp's from ALF: syst 120s-140s, diast 50s-80s, HR 60s to 90s. Started amlodipine 5mg  qd last visit.  LE swelling is stable, skin in LL's responding well to the urea emul.  Pertinent PMH:  Past medical, surgical, social, and family history reviewed and no changes are noted since last office visit.  MEDS:  Outpatient Prescriptions Prior to Visit  Medication Sig Dispense Refill  . acetaminophen (TYLENOL) 500 MG tablet Take 2 tablet by mouth at bedtime    . amLODipine (NORVASC) 5 MG tablet Take 1 tablet (5 mg total) by mouth daily. 30 tablet 11  . atorvastatin (LIPITOR) 10 MG tablet TAKE 1 TABLET EVERY DAY 30 tablet 3  . cetirizine (ZYRTEC) 10 MG tablet Take 10 mg by mouth daily.      . Cholecalciferol (EQL VITAMIN D3) 1000 UNITS tablet Take 1,000 Units by mouth daily.      . clopidogrel (PLAVIX) 75 MG tablet Take 1 tablet (75 mg total) by mouth daily. 30 tablet 3  . divalproex (DEPAKOTE SPRINKLE) 125 MG capsule TAKE 1 CAPSULE TWICE DAILY 60 capsule 5  . furosemide (LASIX) 20 MG tablet Take 1 tablet (20 mg total) by mouth daily. 30 tablet 6  . iron polysaccharides (NIFEREX) 150 MG capsule Take 150 mg by mouth daily.       . Multiple Vitamin (MULTIVITAMIN) capsule Take 1 capsule by mouth daily.      Marland Kitchen oxybutynin (DITROPAN) 5 MG tablet Take 1 tablet (5 mg total) by mouth 2 (two) times daily. (Patient taking differently: Take 5 mg by mouth 2 (two) times daily. Taking once daily (06/09/14)) 60 tablet 11  . ranitidine ([employer] RANITIDINE) 75 MG tablet TAKE ONE TABLET BY MOUTH AT BEDTIME 30 tablet 3  . sodium chloride (OCEAN) 0.65 % nasal spray Place 1 spray into the nose at bedtime.    . Urea in Zn Undecyl-Lactic Acid 45 % EMUL Apply to lower extremities once daily 240 mL 6  . vitamin C (ASCORBIC ACID) 500 MG tablet Take 500 mg by mouth daily. To take along with the iron tablet daily     . fluconazole (DIFLUCAN) 100 MG tablet 1 tab po qd x 3 days (Patient not taking: Reported on 09/08/2014) 3 tablet 0   No facility-administered medications prior to visit.    PE: Blood pressure 150/75, pulse 59, temperature 97.9 F (36.6 C), temperature source Oral, resp. rate 16, weight 128 lb (58.06 kg), SpO2 93 %. Gen: alert, well appearing.  Pleasantly demented. ENT: mild clear rhinorrhea w/out nasal obstruction. Oropharynx: moist/pink, no swelling or erythema or exudate. Neck:  no LAD. CV: RRR, distant S1 and S2, w/out audible m/r/g. LUNGS: CTA bilat, nonlabored resps. EXT: 2-3+ pitting edema bilat, w/out any flaking/dermatitis/skin breakdown.  LABS:  Lab Results  Component Value Date   WBC 5.5 06/09/2014   HGB 9.8* 06/09/2014   HCT 28.8* 06/09/2014   MCV 92.3 06/09/2014   PLT 147.0* 06/09/2014      Chemistry      Component Value Date/Time   NA 143 06/09/2014 1514   K 4.8 06/09/2014 1514   CL 109 06/09/2014 1514   CO2 28 06/09/2014 1514   BUN 66* 06/09/2014 1514   CREATININE 2.03* 06/09/2014 1514      Component Value Date/Time   CALCIUM 9.6 06/09/2014 1514   ALKPHOS 102 06/09/2014 1514   AST 13 06/09/2014 1514   ALT 10 06/09/2014 1514   BILITOT 0.3 06/09/2014 1514     Lab Results  Component Value Date    CHOL 133 12/02/2012   HDL 42.60 12/02/2012   LDLCALC 64 12/02/2012   LDLDIRECT 74.7 01/29/2011   TRIG 132.0 12/02/2012   CHOLHDL 3 12/02/2012    Lab Results  Component Value Date   IRON 61 12/02/2012   Lab Results  Component Value Date   [id number] 12/02/2012    IMPRESSION AND PLAN:  1) Chronic diarrhea--per pt report but no one at her ALF has been able to even verify that she has this. She appears well hydrated and her wt is stable since last visit. Will see if this is possibly medication related: stop atorvastatin and niferex. Check CBC, BMET.  No anti-diarrhea med at this time. Unable to get a stool sample collected for eval, but I think the likelihood of infectious diarrhea is low anyway.  2) Chronic normocytic anemia: follow Hb today. Continue vit B12 inj for hx of B12 deficiency.  This was given here today, but I wrote an order for this to start being given at her ALF beginning with next injection 10/09/14. Check iron studies today (she has been taking niferex 150mg  qd).  3) HTN; improved on amlodipine 5mg  qd.  4) Chronic venous insufficiency of both LL's: stable.  Her flaky dermatitis skin changes have responded very well to the ureo emollient they put on her legs --continue this. Continue lasix 20 mg qd. Check lytes/cr today.  5) CRI, stage 3/4 (GFR around 30 ml/min): check BMET today.  This has been stable.  6) Dementia w/behav dist: stable. Continue depakote.  An After Visit Summary was printed and given to the patient.  FOLLOW UP: 51mo

## 2014-09-08 NOTE — Progress Notes (Signed)
Pre visit review using our clinic review tool, if applicable. No additional management support is needed unless otherwise documented below in the visit note. 

## 2014-09-09 LAB — BASIC METABOLIC PANEL
BUN: 59 mg/dL — AB (ref 6–23)
CHLORIDE: 107 meq/L (ref 96–112)
CO2: 26 mEq/L (ref 19–32)
Calcium: 9.7 mg/dL (ref 8.4–10.5)
Creatinine, Ser: 1.88 mg/dL — ABNORMAL HIGH (ref 0.40–1.20)
GFR: 26.59 mL/min — ABNORMAL LOW (ref >60.00)
Glucose, Bld: 100 mg/dL — ABNORMAL HIGH (ref 70–99)
POTASSIUM: 5.4 meq/L — AB (ref 3.5–5.1)
SODIUM: 139 meq/L (ref 135–145)

## 2014-09-09 LAB — CBC WITH DIFFERENTIAL/PLATELET
BASOS ABS: 0 10*3/uL (ref 0.0–0.1)
BASOS PCT: 0.4 % (ref 0.0–3.0)
EOS PCT: 2.5 % (ref 0.0–5.0)
Eosinophils Absolute: 0.2 10*3/uL (ref 0.0–0.7)
HEMATOCRIT: 29.8 % — AB (ref 36.0–46.0)
Hemoglobin: 10.3 g/dL — ABNORMAL LOW (ref 12.0–15.0)
LYMPHS PCT: 30.4 % (ref 12.0–46.0)
Lymphs Abs: 1.8 10*3/uL (ref 0.7–4.0)
MCHC: 34.6 g/dL (ref 30.0–36.0)
MCV: 92.2 fl (ref 78.0–100.0)
MONOS PCT: 8 % (ref 3.0–12.0)
Monocytes Absolute: 0.5 10*3/uL (ref 0.1–1.0)
Neutro Abs: 3.5 10*3/uL (ref 1.4–7.7)
Neutrophils Relative %: 58.7 % (ref 43.0–77.0)
Platelets: 172 10*3/uL (ref 150.0–400.0)
RBC: 3.23 Mil/uL — ABNORMAL LOW (ref 3.87–5.11)
RDW: 13.2 % (ref 11.5–15.5)
WBC: 6 10*3/uL (ref 4.0–10.5)

## 2014-09-09 LAB — IBC PANEL
IRON: 54 ug/dL (ref 42–145)
SATURATION RATIOS: 19.5 % — AB (ref 20.0–50.0)
Transferrin: 198 mg/dL — ABNORMAL LOW (ref 212.0–360.0)

## 2014-09-09 LAB — FERRITIN: FERRITIN: 200.2 ng/mL (ref 10.0–291.0)

## 2014-09-12 DIAGNOSIS — N183 Chronic kidney disease, stage 3 (moderate): Secondary | ICD-10-CM | POA: Diagnosis not present

## 2014-09-12 DIAGNOSIS — I129 Hypertensive chronic kidney disease with stage 1 through stage 4 chronic kidney disease, or unspecified chronic kidney disease: Secondary | ICD-10-CM | POA: Diagnosis not present

## 2014-09-12 DIAGNOSIS — M6281 Muscle weakness (generalized): Secondary | ICD-10-CM | POA: Diagnosis not present

## 2014-09-12 DIAGNOSIS — Z9181 History of falling: Secondary | ICD-10-CM | POA: Diagnosis not present

## 2014-09-12 DIAGNOSIS — D510 Vitamin B12 deficiency anemia due to intrinsic factor deficiency: Secondary | ICD-10-CM | POA: Diagnosis not present

## 2014-09-12 DIAGNOSIS — I872 Venous insufficiency (chronic) (peripheral): Secondary | ICD-10-CM | POA: Diagnosis not present

## 2014-09-12 DIAGNOSIS — F0390 Unspecified dementia without behavioral disturbance: Secondary | ICD-10-CM | POA: Diagnosis not present

## 2014-09-12 DIAGNOSIS — I69354 Hemiplegia and hemiparesis following cerebral infarction affecting left non-dominant side: Secondary | ICD-10-CM | POA: Diagnosis not present

## 2014-09-14 DIAGNOSIS — I69354 Hemiplegia and hemiparesis following cerebral infarction affecting left non-dominant side: Secondary | ICD-10-CM | POA: Diagnosis not present

## 2014-09-14 DIAGNOSIS — F0390 Unspecified dementia without behavioral disturbance: Secondary | ICD-10-CM | POA: Diagnosis not present

## 2014-09-14 DIAGNOSIS — M6281 Muscle weakness (generalized): Secondary | ICD-10-CM | POA: Diagnosis not present

## 2014-09-14 DIAGNOSIS — I872 Venous insufficiency (chronic) (peripheral): Secondary | ICD-10-CM | POA: Diagnosis not present

## 2014-09-14 DIAGNOSIS — I129 Hypertensive chronic kidney disease with stage 1 through stage 4 chronic kidney disease, or unspecified chronic kidney disease: Secondary | ICD-10-CM | POA: Diagnosis not present

## 2014-09-14 DIAGNOSIS — D510 Vitamin B12 deficiency anemia due to intrinsic factor deficiency: Secondary | ICD-10-CM | POA: Diagnosis not present

## 2014-09-14 DIAGNOSIS — N183 Chronic kidney disease, stage 3 (moderate): Secondary | ICD-10-CM | POA: Diagnosis not present

## 2014-09-14 DIAGNOSIS — Z9181 History of falling: Secondary | ICD-10-CM | POA: Diagnosis not present

## 2014-09-19 DIAGNOSIS — N183 Chronic kidney disease, stage 3 (moderate): Secondary | ICD-10-CM | POA: Diagnosis not present

## 2014-09-19 DIAGNOSIS — I69354 Hemiplegia and hemiparesis following cerebral infarction affecting left non-dominant side: Secondary | ICD-10-CM | POA: Diagnosis not present

## 2014-09-19 DIAGNOSIS — Z9181 History of falling: Secondary | ICD-10-CM | POA: Diagnosis not present

## 2014-09-19 DIAGNOSIS — I129 Hypertensive chronic kidney disease with stage 1 through stage 4 chronic kidney disease, or unspecified chronic kidney disease: Secondary | ICD-10-CM | POA: Diagnosis not present

## 2014-09-19 DIAGNOSIS — F0390 Unspecified dementia without behavioral disturbance: Secondary | ICD-10-CM | POA: Diagnosis not present

## 2014-09-19 DIAGNOSIS — D510 Vitamin B12 deficiency anemia due to intrinsic factor deficiency: Secondary | ICD-10-CM | POA: Diagnosis not present

## 2014-09-19 DIAGNOSIS — I872 Venous insufficiency (chronic) (peripheral): Secondary | ICD-10-CM | POA: Diagnosis not present

## 2014-09-19 DIAGNOSIS — M6281 Muscle weakness (generalized): Secondary | ICD-10-CM | POA: Diagnosis not present

## 2014-09-21 DIAGNOSIS — N183 Chronic kidney disease, stage 3 (moderate): Secondary | ICD-10-CM | POA: Diagnosis not present

## 2014-09-21 DIAGNOSIS — I129 Hypertensive chronic kidney disease with stage 1 through stage 4 chronic kidney disease, or unspecified chronic kidney disease: Secondary | ICD-10-CM | POA: Diagnosis not present

## 2014-09-21 DIAGNOSIS — I69354 Hemiplegia and hemiparesis following cerebral infarction affecting left non-dominant side: Secondary | ICD-10-CM | POA: Diagnosis not present

## 2014-09-21 DIAGNOSIS — I872 Venous insufficiency (chronic) (peripheral): Secondary | ICD-10-CM | POA: Diagnosis not present

## 2014-09-21 DIAGNOSIS — D510 Vitamin B12 deficiency anemia due to intrinsic factor deficiency: Secondary | ICD-10-CM | POA: Diagnosis not present

## 2014-09-21 DIAGNOSIS — M6281 Muscle weakness (generalized): Secondary | ICD-10-CM | POA: Diagnosis not present

## 2014-09-21 DIAGNOSIS — Z9181 History of falling: Secondary | ICD-10-CM | POA: Diagnosis not present

## 2014-09-21 DIAGNOSIS — F0390 Unspecified dementia without behavioral disturbance: Secondary | ICD-10-CM | POA: Diagnosis not present

## 2014-09-22 ENCOUNTER — Emergency Department (HOSPITAL_COMMUNITY): Payer: Medicare Other

## 2014-09-22 ENCOUNTER — Emergency Department (HOSPITAL_COMMUNITY)
Admission: EM | Admit: 2014-09-22 | Discharge: 2014-09-22 | Disposition: A | Discharge status: Home / Self Care | Payer: Medicare Other | Attending: Emergency Medicine | Admitting: Emergency Medicine

## 2014-09-22 ENCOUNTER — Encounter (HOSPITAL_COMMUNITY): Payer: Self-pay | Admitting: Emergency Medicine

## 2014-09-22 DIAGNOSIS — Y998 Other external cause status: Secondary | ICD-10-CM | POA: Insufficient documentation

## 2014-09-22 DIAGNOSIS — S20212A Contusion of left front wall of thorax, initial encounter: Secondary | ICD-10-CM | POA: Diagnosis not present

## 2014-09-22 DIAGNOSIS — S0990XA Unspecified injury of head, initial encounter: Secondary | ICD-10-CM | POA: Diagnosis not present

## 2014-09-22 DIAGNOSIS — W182XXA Fall in (into) shower or empty bathtub, initial encounter: Secondary | ICD-10-CM | POA: Diagnosis not present

## 2014-09-22 DIAGNOSIS — Z7902 Long term (current) use of antithrombotics/antiplatelets: Secondary | ICD-10-CM | POA: Diagnosis not present

## 2014-09-22 DIAGNOSIS — R259 Unspecified abnormal involuntary movements: Secondary | ICD-10-CM | POA: Diagnosis not present

## 2014-09-22 DIAGNOSIS — H9190 Unspecified hearing loss, unspecified ear: Secondary | ICD-10-CM | POA: Insufficient documentation

## 2014-09-22 DIAGNOSIS — M25552 Pain in left hip: Secondary | ICD-10-CM | POA: Diagnosis not present

## 2014-09-22 DIAGNOSIS — W19XXXA Unspecified fall, initial encounter: Secondary | ICD-10-CM

## 2014-09-22 DIAGNOSIS — Z8673 Personal history of transient ischemic attack (TIA), and cerebral infarction without residual deficits: Secondary | ICD-10-CM | POA: Diagnosis not present

## 2014-09-22 DIAGNOSIS — R918 Other nonspecific abnormal finding of lung field: Secondary | ICD-10-CM | POA: Diagnosis not present

## 2014-09-22 DIAGNOSIS — N183 Chronic kidney disease, stage 3 (moderate): Secondary | ICD-10-CM | POA: Diagnosis not present

## 2014-09-22 DIAGNOSIS — R0781 Pleurodynia: Secondary | ICD-10-CM | POA: Diagnosis not present

## 2014-09-22 DIAGNOSIS — S79912A Unspecified injury of left hip, initial encounter: Secondary | ICD-10-CM | POA: Diagnosis not present

## 2014-09-22 DIAGNOSIS — E0789 Other specified disorders of thyroid: Secondary | ICD-10-CM | POA: Diagnosis not present

## 2014-09-22 DIAGNOSIS — Z79899 Other long term (current) drug therapy: Secondary | ICD-10-CM | POA: Insufficient documentation

## 2014-09-22 DIAGNOSIS — M25551 Pain in right hip: Secondary | ICD-10-CM | POA: Diagnosis not present

## 2014-09-22 DIAGNOSIS — S79911A Unspecified injury of right hip, initial encounter: Secondary | ICD-10-CM | POA: Diagnosis not present

## 2014-09-22 DIAGNOSIS — Y92121 Bathroom in nursing home as the place of occurrence of the external cause: Secondary | ICD-10-CM | POA: Insufficient documentation

## 2014-09-22 DIAGNOSIS — M545 Low back pain: Secondary | ICD-10-CM | POA: Diagnosis not present

## 2014-09-22 DIAGNOSIS — D510 Vitamin B12 deficiency anemia due to intrinsic factor deficiency: Secondary | ICD-10-CM | POA: Insufficient documentation

## 2014-09-22 DIAGNOSIS — F0390 Unspecified dementia without behavioral disturbance: Secondary | ICD-10-CM | POA: Insufficient documentation

## 2014-09-22 DIAGNOSIS — Y9389 Activity, other specified: Secondary | ICD-10-CM | POA: Diagnosis not present

## 2014-09-22 DIAGNOSIS — S299XXA Unspecified injury of thorax, initial encounter: Secondary | ICD-10-CM | POA: Diagnosis not present

## 2014-09-22 DIAGNOSIS — R6889 Other general symptoms and signs: Secondary | ICD-10-CM | POA: Diagnosis not present

## 2014-09-22 DIAGNOSIS — S199XXA Unspecified injury of neck, initial encounter: Secondary | ICD-10-CM | POA: Diagnosis not present

## 2014-09-22 MED ORDER — ACETAMINOPHEN 325 MG PO TABS
650.0000 mg | ORAL_TABLET | Freq: Once | ORAL | Status: AC
  Administered 2014-09-22: 650 mg via ORAL
  Filled 2014-09-22: qty 2

## 2014-09-22 NOTE — ED Provider Notes (Signed)
TIME SEEN: 4:10 PM  CHIEF COMPLAINT: Fall  HPI: Pt is a 81 y.o. with history of dementia, CVA, chronic kidney disease, hearing loss who presents to the emergency department after she had a mechanical fall at her nursing home today. Patient reports that the staff was trying to give her a shower when she slipped on a towel and fell onto her left side. She does not think she hit her head. She is on Plavix. Complains of left rib pain and left hip pain. Was not ambulatory after the fall. Denies numbness, tingling or focal weakness.  ROS: See HPI Constitutional: no fever  Eyes: no drainage  ENT: no runny nose   Cardiovascular:  no chest pain  Resp: no SOB  GI: no vomiting GU: no dysuria Integumentary: no rash  Allergy: no hives  Musculoskeletal: no leg swelling  Neurological: no slurred speech ROS otherwise negative  PAST MEDICAL HISTORY/PAST SURGICAL HISTORY:  Past Medical History  Diagnosis Date  . Allergic rhinitis, cause unspecified   . Unspecified hearing loss   . Cerebrovascular disease, unspecified     CDopplers 1/13 showed mild mixed plaque w/ 40-59% (low end) bilat ICA stenoses, the prev right CAE & patch angioplasty were patent  . Unspecified venous (peripheral) insufficiency   . Pure hypercholesterolemia   . Unspecified disorder resulting from impaired renal function     hyperkalemia and anemia  . Diaphragmatic hernia without mention of obstruction or gangrene   . Unspecified vitamin D deficiency   . Unspecified cerebral artery occlusion with cerebral infarction     MRI Brain 12/12 showed large remote right hemispheric infarct w/ encephalomalacia w/ prominent small vessel dis superimposed on atrophy... (residual deficits are diminished sensation on left side, left hand grip weakness,   . Dementia with behavioral disturbance   . Pernicious anemia     depakote helpful  . Cataracts, bilateral   . Retinal hemorrhage of right eye   . Chronic renal insufficiency, stage III  (moderate)     Borderline stage IV (CrCl around 30 ml/min)    MEDICATIONS:  Prior to Admission medications   Medication Sig Start Date End Date Taking? Authorizing Provider  acetaminophen (TYLENOL) 500 MG tablet Take 1,000 mg by mouth at bedtime.    Yes Historical Provider, MD  amLODipine (NORVASC) 5 MG tablet Take 1 tablet (5 mg total) by mouth daily. 06/09/14  Yes Tammi Sou, MD  clopidogrel (PLAVIX) 75 MG tablet Take 1 tablet (75 mg total) by mouth daily. 05/02/14  Yes Tammi Sou, MD  divalproex (DEPAKOTE SPRINKLE) 125 MG capsule TAKE 1 CAPSULE TWICE DAILY Patient taking differently: Take 125 mg by mouth 2 (two) times daily.  03/02/14  Yes Tammi Sou, MD  ENSURE PLUS (ENSURE PLUS) LIQD Take 237 mLs by mouth daily.   Yes Historical Provider, MD  furosemide (LASIX) 20 MG tablet Take 1 tablet (20 mg total) by mouth daily. 01/05/14  Yes Tammi Sou, MD  loratadine (CLARITIN REDITABS) 10 MG dissolvable tablet Take 10 mg by mouth daily as needed for allergies.   Yes Historical Provider, MD  Multiple Vitamin (MULTIVITAMIN) capsule Take 1 capsule by mouth daily.     Yes Historical Provider, MD  oxybutynin (DITROPAN) 5 MG tablet Take 1 tablet (5 mg total) by mouth 2 (two) times daily. Patient taking differently: Take 5 mg by mouth daily. Taking once daily (06/09/14) 05/07/13  Yes Noralee Space, MD  ranitidine ([employer]S RANITIDINE) 75 MG tablet TAKE ONE TABLET BY MOUTH  AT BEDTIME Patient taking differently: TAKE ONE TABLET BY MOUTH AT DAILY 03/07/14  Yes Tammi Sou, MD  Urea in Zn Undecyl-Lactic Acid 45 % EMUL Apply to lower extremities once daily 10/04/13  Yes Tammi Sou, MD  vitamin B-12 (CYANOCOBALAMIN) 1000 MCG tablet Take 1,000 mcg by mouth daily.   Yes Historical Provider, MD  vitamin C (ASCORBIC ACID) 500 MG tablet Take 500 mg by mouth daily.    Yes Historical Provider, MD  sodium chloride (OCEAN) 0.65 % nasal spray Place 1 spray into the nose at bedtime as needed for  congestion.     Historical Provider, MD    ALLERGIES:  Allergies  Allergen Reactions  . Procaine Hcl     REACTION: pt states reaction to shot at dentist office w/ tachycardia \\T \ SOB (? if really from combination w/epi)    SOCIAL HISTORY:  History  Substance Use Topics  . Smoking status: Never Smoker   . Smokeless tobacco: Never Used  . Alcohol Use: No    FAMILY HISTORY: Family History  Problem Relation Age of Onset  . Heart disease Mother   . Heart disease Father     EXAM: BP 173/49 mmHg  Pulse 54  Temp(Src) 98.5 F (36.9 C) (Oral)  Resp 20  SpO2 100% CONSTITUTIONAL: Alert and oriented and responds appropriately to questions. Well-appearing; well-nourished; GCS 76, elderly HEAD: Normocephalic; atraumatic EYES: Conjunctivae clear, PERRL, EOMI ENT: normal nose; no rhinorrhea; moist mucous membranes; pharynx without lesions noted; no dental injury; no septal hematoma NECK: Supple, no meningismus, no LAD; no midline spinal tenderness, step-off or deformity CARD: RRR; S1 and S2 appreciated; no murmurs, no clicks, no rubs, no gallops RESP: Normal chest excursion without splinting or tachypnea; breath sounds clear and equal bilaterally; no wheezes, no rhonchi, no rales; no hypoxia or respiratory distress CHEST:  chest wall stable, tender to palpation over the left chest wall without crepitus or bony deformity, there is a small area of ecchymosis to the posterior left thoracic region ABD/GI: Normal bowel sounds; non-distended; soft, non-tender, no rebound, no guarding PELVIS:  stable, mildly TTP over the left hip without deformity, no leg length discrepancy BACK:  The back appears normal and is non-tender to palpation, there is no CVA tenderness; no midline spinal tenderness, step-off or deformity, kyphotic EXT: Normal ROM in all joints; non-tender to palpation; patient is chronic edema of her left lower extremity which is unchanged per family; normal capillary refill; no  cyanosis, no bony tenderness or bony deformity of patient's extremities, no joint effusion, no ecchymosis or lacerations    SKIN: Normal color for age and race; warm NEURO: Moves all extremities equally, sensation to light touch intact diffusely, cranial nerves II through XII intact PSYCH: The patient's mood and manner are appropriate. Grooming and personal hygiene are appropriate.  MEDICAL DECISION MAKING: Pt here with mechanical fall. Will obtain imaging of her head, cervical spine, left rib x-ray, bilateral hips. We'll give Tylenol for pain.  ED PROGRESS: Patient's imaging shows no acute injury. She has been able to stand and ambulate several steps with me without difficulty. Hemodynamically stable. Still at her neurologic baseline. X-rays do show a 4 mm irregular mass in the left upper lobe concerning for small primary lung carcinoma and a 4.6 x 3.4 left lobe thyroid mass. Have discussed these findings with patient's son and that they will need to be followed as an outpatient. He states he will make an appoint with her primary care provider. Will provide him  with these testing results. Discussed return precautions. I feel she is safe to be discharged back to her facility.     [place], DO 09/22/14 1948

## 2014-09-22 NOTE — ED Notes (Signed)
Patient is alert and oriented x3.  She was given DC instructions and follow up visit instructions.  Patient gave verbal understanding. She was DC ambulatory under her own power to home.  V/S stable.  He was not showing any signs of distress on DC 

## 2014-09-22 NOTE — ED Notes (Addendum)
Pt from [place]. Pt was taking a shower when she slipped on a towel. Pt had assisted fall with nursing home staff member. Pt on plavix. Pt complains of chronic back pain and has skin tear on L elbow. Pt hard of hearing

## 2014-09-22 NOTE — Discharge Instructions (Signed)
You may take Tylenol as prescribed for your hip pain, rib pain.   You were found to have a thyroid mass and lung mass under imaging that will need to be followed by your primary care provider.  Imaging showed no sign of acute injury.  Chest Contusion A chest contusion is a deep bruise on your chest area. Contusions are the result of an injury that caused bleeding under the skin. A chest contusion may involve bruising of the skin, muscles, or ribs. The contusion may turn blue, purple, or yellow. Minor injuries will give you a painless contusion, but more severe contusions may stay painful and swollen for a few weeks. CAUSES  A contusion is usually caused by a blow, trauma, or direct force to an area of the body. SYMPTOMS   Swelling and redness of the injured area.  Discoloration of the injured area.  Tenderness and soreness of the injured area.  Pain. DIAGNOSIS  The diagnosis can be made by taking a history and performing a physical exam. An X-ray, CT scan, or MRI may be needed to determine if there were any associated injuries, such as broken bones (fractures) or internal injuries. TREATMENT  Often, the best treatment for a chest contusion is resting, icing, and applying cold compresses to the injured area. Deep breathing exercises may be recommended to reduce the risk of pneumonia. Over-the-counter medicines may also be recommended for pain control. HOME CARE INSTRUCTIONS   Put ice on the injured area.  Put ice in a plastic bag.  Place a towel between your skin and the bag.  Leave the ice on for 15-20 minutes, 03-04 times a day.  Only take over-the-counter or prescription medicines as directed by your caregiver. Your caregiver may recommend avoiding anti-inflammatory medicines (aspirin, ibuprofen, and naproxen) for 48 hours because these medicines may increase bruising.  Rest the injured area.  Perform deep-breathing exercises as directed by your caregiver.  Stop smoking if  you smoke.  Do not lift objects over 5 pounds (2.3 kg) for 3 days or longer if recommended by your caregiver. SEEK IMMEDIATE MEDICAL CARE IF:   You have increased bruising or swelling.  You have pain that is getting worse.  You have difficulty breathing.  You have dizziness, weakness, or fainting.  You have blood in your urine or stool.  You cough up or vomit blood.  Your swelling or pain is not relieved with medicines. MAKE SURE YOU:   Understand these instructions.  Will watch your condition.  Will get help right away if you are not doing well or get worse. Document Released: 01/08/2001 Document Revised: 01/08/2012 Document Reviewed: 10/07/2011 [facility] Patient Information 2015 [place], [place]. This information is not intended to replace advice given to you by your health care provider. Make sure you discuss any questions you have with your health care provider.  Head Injury You have received a head injury. It does not appear serious at this time. Headaches and vomiting are common following head injury. It should be easy to awaken from sleeping. Sometimes it is necessary for you to stay in the emergency department for a while for observation. Sometimes admission to the hospital may be needed. After injuries such as yours, most problems occur within the first 24 hours, but side effects may occur up to 7-10 days after the injury. It is important for you to carefully monitor your condition and contact your health care provider or seek immediate medical care if there is a change in your condition. WHAT  ARE THE TYPES OF HEAD INJURIES? Head injuries can be as minor as a bump. Some head injuries can be more severe. More severe head injuries include:  A jarring injury to the brain (concussion).  A bruise of the brain (contusion). This mean there is bleeding in the brain that can cause swelling.  A cracked skull (skull fracture).  Bleeding in the brain that collects, clots, and forms  a bump (hematoma). WHAT CAUSES A HEAD INJURY? A serious head injury is most likely to happen to someone who is in a car wreck and is not wearing a seat belt. Other causes of major head injuries include bicycle or motorcycle accidents, sports injuries, and falls. HOW ARE HEAD INJURIES DIAGNOSED? A complete history of the event leading to the injury and your current symptoms will be helpful in diagnosing head injuries. Many times, pictures of the brain, such as CT or MRI are needed to see the extent of the injury. Often, an overnight hospital stay is necessary for observation.  WHEN SHOULD I SEEK IMMEDIATE MEDICAL CARE?  You should get help right away if:  You have confusion or drowsiness.  You feel sick to your stomach (nauseous) or have continued, forceful vomiting.  You have dizziness or unsteadiness that is getting worse.  You have severe, continued headaches not relieved by medicine. Only take over-the-counter or prescription medicines for pain, fever, or discomfort as directed by your health care provider.  You do not have normal function of the arms or legs or are unable to walk.  You notice changes in the black spots in the center of the colored part of your eye (pupil).  You have a clear or bloody fluid coming from your nose or ears.  You have a loss of vision. During the next 24 hours after the injury, you must stay with someone who can watch you for the warning signs. This person should contact local emergency services (911 in the [place] if you have seizures, you become unconscious, or you are unable to wake up. HOW CAN I PREVENT A HEAD INJURY IN THE FUTURE? The most important factor for preventing major head injuries is avoiding motor vehicle accidents. To minimize the potential for damage to your head, it is crucial to wear seat belts while riding in motor vehicles. Wearing helmets while bike riding and playing collision sports (like football) is also helpful. Also, avoiding  dangerous activities around the house will further help reduce your risk of head injury.  WHEN CAN I RETURN TO NORMAL ACTIVITIES AND ATHLETICS? You should be reevaluated by your health care provider before returning to these activities. If you have any of the following symptoms, you should not return to activities or contact sports until 1 week after the symptoms have stopped:  Persistent headache.  Dizziness or vertigo.  Poor attention and concentration.  Confusion.  Memory problems.  Nausea or vomiting.  Fatigue or tire easily.  Irritability.  Intolerant of bright lights or loud noises.  Anxiety or depression.  Disturbed sleep. MAKE SURE YOU:   Understand these instructions.  Will watch your condition.  Will get help right away if you are not doing well or get worse. Document Released: 04/15/2005 Document Revised: 04/20/2013 Document Reviewed: 12/21/2012 [facility] Patient Information 2015 [place], [place]. This information is not intended to replace advice given to you by your health care provider. Make sure you discuss any questions you have with your health care provider.  Hip Pain Your hip is the joint between your upper  legs and your lower pelvis. The bones, cartilage, tendons, and muscles of your hip joint perform a lot of work each day supporting your body weight and allowing you to move around. Hip pain can range from a minor ache to severe pain in one or both of your hips. Pain may be felt on the inside of the hip joint near the groin, or the outside near the buttocks and upper thigh. You may have swelling or stiffness as well.  HOME CARE INSTRUCTIONS   Take medicines only as directed by your health care provider.  Apply ice to the injured area:  Put ice in a plastic bag.  Place a towel between your skin and the bag.  Leave the ice on for 15-20 minutes at a time, 3-4 times a day.  Keep your leg raised (elevated) when possible to lessen swelling.  Avoid  activities that cause pain.  Follow specific exercises as directed by your health care provider.  Sleep with a pillow between your legs on your most comfortable side.  Record how often you have hip pain, the location of the pain, and what it feels like. SEEK MEDICAL CARE IF:   You are unable to put weight on your leg.  Your hip is red or swollen or very tender to touch.  Your pain or swelling continues or worsens after 1 week.  You have increasing difficulty walking.  You have a fever. SEEK IMMEDIATE MEDICAL CARE IF:   You have fallen.  You have a sudden increase in pain and swelling in your hip. MAKE SURE YOU:   Understand these instructions.  Will watch your condition.  Will get help right away if you are not doing well or get worse. Document Released: 10/03/2009 Document Revised: 08/30/2013 Document Reviewed: 12/10/2012 [facility] Patient Information 2015 [place], [place]. This information is not intended to replace advice given to you by your health care provider. Make sure you discuss any questions you have with your health care provider.

## 2014-09-22 NOTE — ED Notes (Signed)
Bed: [id number] Expected date:  Expected time:  Means of arrival:  Comments: EMS/49F/fall

## 2014-09-22 NOTE — ED Notes (Signed)
Patient transported to X-ray 

## 2014-09-22 NOTE — ED Notes (Signed)
[facility] contacted for transport 

## 2014-09-22 NOTE — Progress Notes (Signed)
CSW met with pt at bedside. She appears to be hard of hearing. Son was present who also states that he is the pt's POA.   Patient and son confirm that she presents to [facility] due to her falling. Patient states that a staff member accidentally let her fall while giving her a shower.  Patient informed CSW that her L side is sore. Patient and son state that she does not fall often. Son informed CSW that this is the x1 the pt has fallen in 6 months.  Son informed CSW that he feels as if the pt is at the appropriate level of care. He states that the pt is active for her age and participates in the activities while at the facility.  Patient is alert and oriented.    Willette Brace [phone] ED CSW 09/22/2014 4:56 PM

## 2014-09-29 ENCOUNTER — Ambulatory Visit (INDEPENDENT_AMBULATORY_CARE_PROVIDER_SITE_OTHER): Payer: Medicare Other | Admitting: Family Medicine

## 2014-09-29 ENCOUNTER — Encounter: Payer: Self-pay | Admitting: Family Medicine

## 2014-09-29 VITALS — BP 132/63 | HR 42 | Temp 98.1°F | Resp 16 | Wt 129.0 lb

## 2014-09-29 DIAGNOSIS — D497 Neoplasm of unspecified behavior of endocrine glands and other parts of nervous system: Secondary | ICD-10-CM | POA: Diagnosis not present

## 2014-09-29 DIAGNOSIS — D510 Vitamin B12 deficiency anemia due to intrinsic factor deficiency: Secondary | ICD-10-CM

## 2014-09-29 DIAGNOSIS — E0789 Other specified disorders of thyroid: Secondary | ICD-10-CM

## 2014-09-29 DIAGNOSIS — R918 Other nonspecific abnormal finding of lung field: Secondary | ICD-10-CM

## 2014-09-29 MED ORDER — CYANOCOBALAMIN 1000 MCG/ML IJ SOLN
1000.0000 ug | Freq: Once | INTRAMUSCULAR | Status: AC
  Administered 2014-09-29: 1000 ug via INTRAMUSCULAR

## 2014-09-29 NOTE — Progress Notes (Signed)
OFFICE VISIT  10/09/2014   CC:  Chief Complaint  Patient presents with  . Hospitalization Follow-up    ER visit on 09/21/13, fell while taking a shower at [facility]  . Lung Mass    Found while in ER  . Thyroid Nodule    Found while in ER  . B12 Injection    If due.   HPI:    Patient is a 81 y.o. Caucasian female who presents accompanied by her daughter Sander Radon for f/u recent ED visit on 09/22/14  for a fall, at which time some imaging was done and these showed no acute injury but did reveal some incidental findings that need follow up discussion:  70mm irregular mass in L upper lobe of lung concerning for primary lung cancer, and also a 4.6 cm X 3.4 cm left lobe thyroid mass. I reviewed her entire ED visit record today.  She does resport a recent history of discomfort with swallowing, sometimes chokes on food/pills.  Daughter reports this is an issue that is not really that new.   She still complaints of loose stools and requests something for her diarrhea.  No fevers, no abnl wt loss, no blood in stool. No cough, no SOB, no chest pain, no hemoptysis.  No pain in neck.   Past Medical History  Diagnosis Date  . Allergic rhinitis, cause unspecified   . Unspecified hearing loss   . Cerebrovascular disease, unspecified     CDopplers 1/13 showed mild mixed plaque w/ 40-59% (low end) bilat ICA stenoses, the prev right CAE & patch angioplasty were patent  . Unspecified venous (peripheral) insufficiency   . Pure hypercholesterolemia   . Unspecified disorder resulting from impaired renal function     hyperkalemia and anemia  . Diaphragmatic hernia without mention of obstruction or gangrene   . Unspecified vitamin D deficiency   . Unspecified cerebral artery occlusion with cerebral infarction     MRI Brain 12/12 showed large remote right hemispheric infarct w/ encephalomalacia w/ prominent small vessel dis superimposed on atrophy... (residual deficits are diminished sensation on left  side, left hand grip weakness,   . Dementia with behavioral disturbance   . Pernicious anemia     depakote helpful  . Cataracts, bilateral   . Retinal hemorrhage of right eye   . Chronic renal insufficiency, stage III (moderate)     Borderline stage IV (CrCl around 30 ml/min)    Past Surgical History  Procedure Laterality Date  . Cholecystectomy    . Carotid endarterectomy    . Laser surgery on right eye  12/02/2012    Dr. [facility]aird [facility] at [facility]    Outpatient Prescriptions Prior to Visit  Medication Sig Dispense Refill  . acetaminophen (TYLENOL) 500 MG tablet Take 1,000 mg by mouth at bedtime.     Marland Kitchen amLODipine (NORVASC) 5 MG tablet Take 1 tablet (5 mg total) by mouth daily. 30 tablet 11  . clopidogrel (PLAVIX) 75 MG tablet Take 1 tablet (75 mg total) by mouth daily. 30 tablet 3  . divalproex (DEPAKOTE SPRINKLE) 125 MG capsule TAKE 1 CAPSULE TWICE DAILY (Patient taking differently: Take 125 mg by mouth 2 (two) times daily. ) 60 capsule 5  . ENSURE PLUS (ENSURE PLUS) LIQD Take 237 mLs by mouth daily.    . furosemide (LASIX) 20 MG tablet Take 1 tablet (20 mg total) by mouth daily. 30 tablet 6  . loratadine (CLARITIN REDITABS) 10 MG dissolvable tablet Take 10 mg by mouth daily as  needed for allergies.    . Multiple Vitamin (MULTIVITAMIN) capsule Take 1 capsule by mouth daily.      Marland Kitchen oxybutynin (DITROPAN) 5 MG tablet Take 1 tablet (5 mg total) by mouth 2 (two) times daily. (Patient taking differently: Take 5 mg by mouth daily. Taking once daily (06/09/14)) 60 tablet 11  . ranitidine ([employer] RANITIDINE) 75 MG tablet TAKE ONE TABLET BY MOUTH AT BEDTIME (Patient taking differently: TAKE ONE TABLET BY MOUTH AT DAILY) 30 tablet 3  . sodium chloride (OCEAN) 0.65 % nasal spray Place 1 spray into the nose at bedtime as needed for congestion.     . Urea in Zn Undecyl-Lactic Acid 45 % EMUL Apply to lower extremities once daily 240 mL 6  . vitamin B-12 (CYANOCOBALAMIN) 1000 MCG tablet Take 1,000 mcg  by mouth daily.    . vitamin C (ASCORBIC ACID) 500 MG tablet Take 500 mg by mouth daily.      No facility-administered medications prior to visit.    Allergies  Allergen Reactions  . Procaine Hcl     REACTION: pt states reaction to shot at dentist office w/ tachycardia \\T \ SOB (? if really from combination w/epi)    ROS As per HPI  PE: Blood pressure 132/63, pulse 42, temperature 98.1 F (36.7 C), temperature source Oral, resp. rate 16, weight 129 lb (58.514 kg), SpO2 96 %. Wt has dropped from 146 lbs one year ago to 129 lbs today. Gen: alert, oriented to person, very pleasantly demented. HEENT: head w/out edema/swelling/bruising. ENT: Ears: EACs clear, normal epithelium.  TMs with good light reflex and landmarks bilaterally.  Eyes: no injection, icteris, swelling, or exudate.  EOMI, PERRLA. Nose: no drainage or turbinate edema/swelling.  No injection or focal lesion.  Mouth: lips without lesion/swelling.  Oral mucosa pink and moist. Oropharynx without erythema, exudate, or swelling.  Neck - No masses or thyromegaly or limitation in range of motion CV: RRR, no m/r/g.   LUNGS: CTA bilat, nonlabored resps, good aeration in all lung fields. Moves all extremities normally, no palpable tenderness anywhere. Abd: soft, NT/ND ENT: no clubbing or cyanosis, 1+ edema in both LL's, skin w/out erythema or hyperkeratosis or breakdown.  LABS:    Chemistry      Component Value Date/Time   NA 139 09/08/2014 1529   K 5.4* 09/08/2014 1529   CL 107 09/08/2014 1529   CO2 26 09/08/2014 1529   BUN 59* 09/08/2014 1529   CREATININE 1.88* 09/08/2014 1529      Component Value Date/Time   CALCIUM 9.7 09/08/2014 1529   ALKPHOS 102 06/09/2014 1514   AST 13 06/09/2014 1514   ALT 10 06/09/2014 1514   BILITOT 0.3 06/09/2014 1514     Lab Results  Component Value Date   WBC 6.0 09/08/2014   HGB 10.3* 09/08/2014   HCT 29.8* 09/08/2014   MCV 92.2 09/08/2014   PLT 172.0 09/08/2014   Lab Results   Component Value Date   TSH 1.22 09/29/2014   Lab Results  Component Value Date   CHOL 133 12/02/2012   HDL 42.60 12/02/2012   LDLCALC 64 12/02/2012   LDLDIRECT 74.7 01/29/2011   TRIG 132.0 12/02/2012   CHOLHDL 3 12/02/2012   IMPRESSION AND PLAN:  1) Recent fall, no injury.  2) Left upper lobe lung mass: 43mm, new and with features on recent CT concerning for bronchogenic carcinoma. Long, detailed discussion with pt's daughter today, who his the pt's [facility] POA.  With all info and circumstances considered,  she and her brother have agreed that they want nothing further to be done regarding this problem.  I told them I agree with this decision.  They understand that decisions like this can always be changed and if they have further discussions or questions they can call or return any time.  At daughter's request, DNR orders placed in pt's chart, given to daughter, and given to pt to bring back to her ALF.  3) Newly dx'd thyroid mass: see #2 above.  4) Pernicious anemia: vit B12 IM today 1000 mcg.  An After Visit Summary was printed and given to the patient.  Spent 60 min with pt today, with >50% of this time spent in counseling and care coordination regarding the above problems.  FOLLOW UP: Return in about 4 months (around 01/29/2015).

## 2014-09-29 NOTE — Progress Notes (Signed)
Pre visit review using our clinic review tool, if applicable. No additional management support is needed unless otherwise documented below in the visit note. 

## 2014-09-30 LAB — TSH: TSH: 1.22 u[IU]/mL (ref 0.35–4.50)

## 2014-09-30 LAB — T3: T3, Total: 86.6 ng/dL (ref 80.0–204.0)

## 2014-09-30 LAB — T4, FREE: Free T4: 0.7 ng/dL (ref 0.60–1.60)

## 2014-11-03 ENCOUNTER — Ambulatory Visit (INDEPENDENT_AMBULATORY_CARE_PROVIDER_SITE_OTHER): Payer: Medicare Other | Admitting: *Deleted

## 2014-11-03 DIAGNOSIS — D510 Vitamin B12 deficiency anemia due to intrinsic factor deficiency: Secondary | ICD-10-CM

## 2014-11-03 MED ORDER — CYANOCOBALAMIN 1000 MCG/ML IJ SOLN
1000.0000 ug | Freq: Once | INTRAMUSCULAR | Status: AC
  Administered 2014-11-03: 1000 ug via INTRAMUSCULAR

## 2014-12-06 ENCOUNTER — Other Ambulatory Visit: Payer: Self-pay

## 2014-12-08 ENCOUNTER — Ambulatory Visit (INDEPENDENT_AMBULATORY_CARE_PROVIDER_SITE_OTHER): Payer: Medicare Other | Admitting: Family Medicine

## 2014-12-08 ENCOUNTER — Encounter: Payer: Self-pay | Admitting: Family Medicine

## 2014-12-08 VITALS — BP 146/54 | HR 54 | Temp 97.8°F | Resp 16 | Wt 125.0 lb

## 2014-12-08 DIAGNOSIS — K529 Noninfective gastroenteritis and colitis, unspecified: Secondary | ICD-10-CM

## 2014-12-08 DIAGNOSIS — F03918 Unspecified dementia, unspecified severity, with other behavioral disturbance: Secondary | ICD-10-CM

## 2014-12-08 DIAGNOSIS — Z8639 Personal history of other endocrine, nutritional and metabolic disease: Secondary | ICD-10-CM

## 2014-12-08 DIAGNOSIS — D510 Vitamin B12 deficiency anemia due to intrinsic factor deficiency: Secondary | ICD-10-CM | POA: Diagnosis not present

## 2014-12-08 DIAGNOSIS — N183 Chronic kidney disease, stage 3 unspecified: Secondary | ICD-10-CM

## 2014-12-08 DIAGNOSIS — F0391 Unspecified dementia with behavioral disturbance: Secondary | ICD-10-CM

## 2014-12-08 LAB — BASIC METABOLIC PANEL
BUN: 48 mg/dL — AB (ref 6–23)
CALCIUM: 9.4 mg/dL (ref 8.4–10.5)
CHLORIDE: 111 meq/L (ref 96–112)
CO2: 24 mEq/L (ref 19–32)
Creatinine, Ser: 1.79 mg/dL — ABNORMAL HIGH (ref 0.40–1.20)
GFR: 28.12 mL/min — AB (ref >60.00)
Glucose, Bld: 113 mg/dL — ABNORMAL HIGH (ref 70–99)
Potassium: 5.6 mEq/L — ABNORMAL HIGH (ref 3.5–5.1)
Sodium: 143 mEq/L (ref 135–145)

## 2014-12-08 MED ORDER — CYANOCOBALAMIN 1000 MCG/ML IJ SOLN
1000.0000 ug | Freq: Once | INTRAMUSCULAR | Status: AC
  Administered 2014-12-08: 1000 ug via INTRAMUSCULAR

## 2014-12-08 NOTE — Progress Notes (Signed)
Pre visit review using our clinic review tool, if applicable. No additional management support is needed unless otherwise documented below in the visit note. 

## 2014-12-08 NOTE — Progress Notes (Signed)
OFFICE VISIT  12/08/2014   CC:  Chief Complaint  Patient presents with  . Follow-up    3 month f/u  . B12 Injection   HPI:    Patient is a 81 y.o. Caucasian female who presents with a NH aid for 3 mo f/u dementia with behavioral disturbance, CRI stage III/IV, cerebrovascular disease with hx of CVA, chronic LE venous insufficiency edema.  Currently living at [facility].  C/o diarrhea still, "4 times this morning before I came here".  Pt demented, cannot hear, makes history-taking exceedingly difficult.  This has been a complaint each of the last couple of visits.  I stopped her statin and iron pil to see if this would help but apparently it didn't.   No fevers, no abd pains.  Appetite is fine.  No n/v. No blood in stools.  Past Medical History  Diagnosis Date  . Allergic rhinitis, cause unspecified   . Unspecified hearing loss   . Cerebrovascular disease, unspecified     CDopplers 1/13 showed mild mixed plaque w/ 40-59% (low end) bilat ICA stenoses, the prev right CAE & patch angioplasty were patent  . Unspecified venous (peripheral) insufficiency   . Pure hypercholesterolemia   . Unspecified disorder resulting from impaired renal function     hyperkalemia and anemia  . Diaphragmatic hernia without mention of obstruction or gangrene   . Unspecified vitamin D deficiency   . Unspecified cerebral artery occlusion with cerebral infarction     MRI Brain 12/12 showed large remote right hemispheric infarct w/ encephalomalacia w/ prominent small vessel dis superimposed on atrophy... (residual deficits are diminished sensation on left side, left hand grip weakness,   . Dementia with behavioral disturbance     depakote helpful  . Pernicious anemia   . Cataracts, bilateral   . Retinal hemorrhage of right eye   . Chronic renal insufficiency, stage III (moderate)     Borderline stage IV (CrCl around 30 ml/min)  . Essential hypertension     Past Surgical History  Procedure Laterality Date   . Cholecystectomy    . Carotid endarterectomy    . Laser surgery on right eye  12/02/2012    Dr. Baird [facility] at [facility]    Outpatient Prescriptions Prior to Visit  Medication Sig Dispense Refill  . acetaminophen (TYLENOL) 500 MG tablet Take 1,000 mg by mouth at bedtime.     Marland Kitchen amLODipine (NORVASC) 5 MG tablet Take 1 tablet (5 mg total) by mouth daily. 30 tablet 11  . clopidogrel (PLAVIX) 75 MG tablet Take 1 tablet (75 mg total) by mouth daily. 30 tablet 3  . divalproex (DEPAKOTE SPRINKLE) 125 MG capsule TAKE 1 CAPSULE TWICE DAILY (Patient taking differently: Take 125 mg by mouth 2 (two) times daily. ) 60 capsule 5  . ENSURE PLUS (ENSURE PLUS) LIQD Take 237 mLs by mouth daily.    . furosemide (LASIX) 20 MG tablet Take 1 tablet (20 mg total) by mouth daily. 30 tablet 6  . loratadine (CLARITIN REDITABS) 10 MG dissolvable tablet Take 10 mg by mouth daily as needed for allergies.    . Multiple Vitamin (MULTIVITAMIN) capsule Take 1 capsule by mouth daily.      Marland Kitchen oxybutynin (DITROPAN) 5 MG tablet Take 1 tablet (5 mg total) by mouth 2 (two) times daily. (Patient taking differently: Take 5 mg by mouth daily. Taking once daily (06/09/14)) 60 tablet 11  . ranitidine ([employer] RANITIDINE) 75 MG tablet TAKE ONE TABLET BY MOUTH AT BEDTIME (Patient taking differently:  TAKE ONE TABLET BY MOUTH AT DAILY) 30 tablet 3  . sodium chloride (OCEAN) 0.65 % nasal spray Place 1 spray into the nose at bedtime as needed for congestion.     . Urea in Zn Undecyl-Lactic Acid 45 % EMUL Apply to lower extremities once daily 240 mL 6  . vitamin B-12 (CYANOCOBALAMIN) 1000 MCG tablet Take 1,000 mcg by mouth daily.    . vitamin C (ASCORBIC ACID) 500 MG tablet Take 500 mg by mouth daily.      No facility-administered medications prior to visit.    Allergies  Allergen Reactions  . Procaine Hcl     REACTION: pt states reaction to shot at dentist office w/ tachycardia \\T \ SOB (? if really from combination w/epi)    ROS As per  HPI  PE: Blood pressure 146/54, pulse 54, temperature 97.8 F (36.6 C), temperature source Oral, resp. rate 16, weight 125 lb (56.7 kg), SpO2 97 %. Gen: Alert, well appearing.  Patient is oriented to person, place, time, and situation. [id number]: no injection, icteris, swelling, or exudate.  EOMI, PERRLA. Mouth: lips without lesion/swelling.  Oral mucosa pink and moist. Oropharynx without erythema, exudate, or swelling.  CV: RRR, 3/6 systolic murmur heard all over precordium, best heard near apex. No rub or gallop. Chest is clear, no wheezing or rales. Normal symmetric air entry throughout both lung fields. No chest wall deformities or tenderness. EXT: 3+ pitting edema in both LL's, L>R, with no erythema or skin breakdown.   LABS:  Lab Results  Component Value Date   TSH 1.22 09/29/2014   Lab Results  Component Value Date   WBC 6.0 09/08/2014   HGB 10.3* 09/08/2014   HCT 29.8* 09/08/2014   MCV 92.2 09/08/2014   PLT 172.0 09/08/2014   Lab Results  Component Value Date   CREATININE 1.88* 09/08/2014   BUN 59* 09/08/2014   NA 139 09/08/2014   K 5.4* 09/08/2014   CL 107 09/08/2014   CO2 26 09/08/2014   Lab Results  Component Value Date   ALT 10 06/09/2014   AST 13 06/09/2014   ALKPHOS 102 06/09/2014   BILITOT 0.3 06/09/2014   Lab Results  Component Value Date   CHOL 133 12/02/2012   Lab Results  Component Value Date   HDL 42.60 12/02/2012   Lab Results  Component Value Date   LDLCALC 64 12/02/2012   Lab Results  Component Value Date   TRIG 132.0 12/02/2012   Lab Results  Component Value Date   CHOLHDL 3 12/02/2012   IMPRESSION AND PLAN:  1) Chronic diarrhea: low suspicion of infectious etiology.  No improvement with d/c of statin and iron tab. Will start trial of imodium 2mg , 2 tabs po qd.  2) Chronic normocytic anemia: Hb stable last check 3 mo ago, has vit B12 def. Gave 1000 mcg vit B12 IM today.  Iron studies were normal 08/2014.  3) Chronic LE  venous insuff: edema stable.  Needs to wear her compression hose. Follow lytes today, continue lasix 20mg  qd.  4) Dementia with behav disturbance: stable.  Continue depakote.    5) HTN: The current medical regimen is effective;  continue present plan and medications.  6) Chronic renal insuff stage III/IV: recheck lytes/cr today (hx of hyperkalemia-mild).  An After Visit Summary was printed and given to the patient.  FOLLOW UP: Return in about 3 months (around 03/10/2015) for routine chronic illness f/u.

## 2014-12-09 ENCOUNTER — Ambulatory Visit: Payer: Medicare Other | Admitting: Family Medicine

## 2014-12-11 ENCOUNTER — Emergency Department (HOSPITAL_COMMUNITY): Payer: Medicare Other

## 2014-12-11 ENCOUNTER — Encounter (HOSPITAL_COMMUNITY): Payer: Self-pay | Admitting: *Deleted

## 2014-12-11 ENCOUNTER — Emergency Department (HOSPITAL_COMMUNITY)
Admission: EM | Admit: 2014-12-11 | Discharge: 2014-12-12 | Disposition: A | Discharge status: Home / Self Care | Payer: Medicare Other | Attending: Emergency Medicine | Admitting: Emergency Medicine

## 2014-12-11 DIAGNOSIS — Z79899 Other long term (current) drug therapy: Secondary | ICD-10-CM | POA: Diagnosis not present

## 2014-12-11 DIAGNOSIS — K8050 Calculus of bile duct without cholangitis or cholecystitis without obstruction: Secondary | ICD-10-CM | POA: Diagnosis not present

## 2014-12-11 DIAGNOSIS — Z8673 Personal history of transient ischemic attack (TIA), and cerebral infarction without residual deficits: Secondary | ICD-10-CM | POA: Insufficient documentation

## 2014-12-11 DIAGNOSIS — N183 Chronic kidney disease, stage 3 (moderate): Secondary | ICD-10-CM | POA: Diagnosis not present

## 2014-12-11 DIAGNOSIS — R079 Chest pain, unspecified: Secondary | ICD-10-CM | POA: Diagnosis not present

## 2014-12-11 DIAGNOSIS — H269 Unspecified cataract: Secondary | ICD-10-CM | POA: Diagnosis not present

## 2014-12-11 DIAGNOSIS — Z8639 Personal history of other endocrine, nutritional and metabolic disease: Secondary | ICD-10-CM | POA: Diagnosis not present

## 2014-12-11 DIAGNOSIS — R1013 Epigastric pain: Secondary | ICD-10-CM

## 2014-12-11 DIAGNOSIS — H9190 Unspecified hearing loss, unspecified ear: Secondary | ICD-10-CM | POA: Insufficient documentation

## 2014-12-11 DIAGNOSIS — Z862 Personal history of diseases of the blood and blood-forming organs and certain disorders involving the immune mechanism: Secondary | ICD-10-CM | POA: Diagnosis not present

## 2014-12-11 DIAGNOSIS — F0391 Unspecified dementia with behavioral disturbance: Secondary | ICD-10-CM | POA: Insufficient documentation

## 2014-12-11 DIAGNOSIS — K5730 Diverticulosis of large intestine without perforation or abscess without bleeding: Secondary | ICD-10-CM | POA: Diagnosis not present

## 2014-12-11 DIAGNOSIS — Z9049 Acquired absence of other specified parts of digestive tract: Secondary | ICD-10-CM | POA: Insufficient documentation

## 2014-12-11 DIAGNOSIS — I129 Hypertensive chronic kidney disease with stage 1 through stage 4 chronic kidney disease, or unspecified chronic kidney disease: Secondary | ICD-10-CM | POA: Insufficient documentation

## 2014-12-11 DIAGNOSIS — R031 Nonspecific low blood-pressure reading: Secondary | ICD-10-CM | POA: Diagnosis not present

## 2014-12-11 LAB — HEPATIC FUNCTION PANEL
ALT: 10 U/L — ABNORMAL LOW (ref 14–54)
AST: 19 U/L (ref 15–41)
Albumin: 3.5 g/dL (ref 3.5–5.0)
Alkaline Phosphatase: 98 U/L (ref 38–126)
Bilirubin, Direct: <0.1 mg/dL — ABNORMAL LOW (ref 0.1–0.5)
Total Bilirubin: 0.4 mg/dL (ref 0.3–1.2)
Total Protein: 6.6 g/dL (ref 6.5–8.1)

## 2014-12-11 LAB — CBC
HCT: 27.5 % — ABNORMAL LOW (ref 36.0–46.0)
HEMOGLOBIN: 9.3 g/dL — AB (ref 12.0–15.0)
MCH: 31.3 pg (ref 26.0–34.0)
MCHC: 33.8 g/dL (ref 30.0–36.0)
MCV: 92.6 fL (ref 78.0–100.0)
Platelets: 134 10*3/uL — ABNORMAL LOW (ref 150–400)
RBC: 2.97 MIL/uL — ABNORMAL LOW (ref 3.87–5.11)
RDW: 12.7 % (ref 11.5–15.5)
WBC: 6.6 10*3/uL (ref 4.0–10.5)

## 2014-12-11 LAB — BASIC METABOLIC PANEL
Anion gap: 11 (ref 5–15)
BUN: 48 mg/dL — AB (ref 6–20)
CHLORIDE: 106 mmol/L (ref 101–111)
CO2: 21 mmol/L — AB (ref 22–32)
CREATININE: 1.86 mg/dL — AB (ref 0.44–1.00)
Calcium: 9.1 mg/dL (ref 8.9–10.3)
GFR calc Af Amer: 26 mL/min — ABNORMAL LOW (ref >60)
GFR calc non Af Amer: 22 mL/min — ABNORMAL LOW (ref >60)
Glucose, Bld: 96 mg/dL (ref 65–99)
POTASSIUM: 5.3 mmol/L — AB (ref 3.5–5.1)
Sodium: 138 mmol/L (ref 135–145)

## 2014-12-11 LAB — I-STAT TROPONIN, ED
Troponin i, poc: 0.02 ng/mL (ref 0.00–0.08)
Troponin i, poc: 0.02 ng/mL (ref 0.00–0.08)

## 2014-12-11 LAB — LIPASE, BLOOD: Lipase: 23 U/L (ref 22–51)

## 2014-12-11 MED ORDER — IOHEXOL 300 MG/ML  SOLN: 50.0000 mL | Freq: Once | INTRAMUSCULAR | Status: DC | PRN

## 2014-12-11 MED ORDER — SODIUM CHLORIDE 0.9 % IV BOLUS (SEPSIS)
500.0000 mL | Freq: Once | INTRAVENOUS | Status: AC
  Administered 2014-12-11: 500 mL via INTRAVENOUS

## 2014-12-11 NOTE — ED Provider Notes (Signed)
CSN: [id number]     Arrival date & time 12/11/14  1358 History   First MD Initiated Contact with Patient 12/11/14 1409     Chief Complaint  Patient presents with  . Chest Pain   HPI   81 year old female presents today with epigastric pain, indigestion. Patient reports she was at assisted living facility when she sat down to eat, shortly after sitting down she developed epigastric pain with no radiation of symptoms. She reports she had multiple episodes of burping which completely resolved the pain. She reports shortly after she again developed the epigastric pain and pressure with burping resolving symptoms. Patient denies nausea, vomiting, diaphoresis, chest pain, shortness of breath, radiation of pain. She denies any history of the same.  The time of evaluation patient was asymptomatic, had no complaints.  Past Medical History  Diagnosis Date  . Allergic rhinitis, cause unspecified   . Unspecified hearing loss   . Cerebrovascular disease, unspecified     CDopplers 1/13 showed mild mixed plaque w/ 40-59% (low end) bilat ICA stenoses, the prev right CAE & patch angioplasty were patent  . Unspecified venous (peripheral) insufficiency   . Pure hypercholesterolemia   . Unspecified disorder resulting from impaired renal function     hyperkalemia and anemia  . Diaphragmatic hernia without mention of obstruction or gangrene   . Unspecified vitamin D deficiency   . Unspecified cerebral artery occlusion with cerebral infarction     MRI Brain 12/12 showed large remote right hemispheric infarct w/ encephalomalacia w/ prominent small vessel dis superimposed on atrophy... (residual deficits are diminished sensation on left side, left hand grip weakness,   . Dementia with behavioral disturbance     depakote helpful  . Pernicious anemia   . Cataracts, bilateral   . Retinal hemorrhage of right eye   . Chronic renal insufficiency, stage III (moderate)     Borderline stage IV (CrCl around 30 ml/min)  .  Essential hypertension    Past Surgical History  Procedure Laterality Date  . Cholecystectomy    . Carotid endarterectomy    . Laser surgery on right eye  12/02/2012    Dr. Baird Cancer at [facility]   Family History  Problem Relation Age of Onset  . Heart disease Mother   . Heart disease Father    Social History  Substance Use Topics  . Smoking status: Never Smoker   . Smokeless tobacco: Never Used  . Alcohol Use: No   OB History    No data available     Review of Systems  All other systems reviewed and are negative.    Allergies  Procaine hcl  Home Medications   Prior to Admission medications   Medication Sig Start Date End Date Taking? Authorizing Provider  acetaminophen (TYLENOL) 500 MG tablet Take 1,000 mg by mouth at bedtime.    Yes Historical Provider, MD  amLODipine (NORVASC) 5 MG tablet Take 1 tablet (5 mg total) by mouth daily. 06/09/14  Yes Tammi Sou, MD  cetirizine (ZYRTEC) 10 MG tablet Take 10 mg by mouth daily.   Yes Historical Provider, MD  clopidogrel (PLAVIX) 75 MG tablet Take 1 tablet (75 mg total) by mouth daily. 05/02/14  Yes Tammi Sou, MD  divalproex (DEPAKOTE SPRINKLE) 125 MG capsule TAKE 1 CAPSULE TWICE DAILY Patient taking differently: Take 125 mg by mouth 2 (two) times daily.  03/02/14  Yes Tammi Sou, MD  ENSURE PLUS (ENSURE PLUS) LIQD Take 237 mLs by mouth daily.  Yes Historical Provider, MD  furosemide (LASIX) 20 MG tablet Take 1 tablet (20 mg total) by mouth daily. 01/05/14  Yes Tammi Sou, MD  Multiple Vitamin (MULTIVITAMIN) capsule Take 1 capsule by mouth daily.     Yes Historical Provider, MD  oxybutynin (DITROPAN) 5 MG tablet Take 1 tablet (5 mg total) by mouth 2 (two) times daily. Patient taking differently: Take 5 mg by mouth daily. Taking once daily (06/09/14) 05/07/13  Yes Noralee Space, MD  ranitidine ([employer] RANITIDINE) 75 MG tablet TAKE ONE TABLET BY MOUTH AT BEDTIME Patient taking differently: TAKE ONE TABLET BY  MOUTH AT DAILY 03/07/14  Yes Tammi Sou, MD  sodium chloride (OCEAN) 0.65 % nasal spray Place 1 spray into the nose at bedtime as needed for congestion.    Yes Historical Provider, MD  Urea in Zn Undecyl-Lactic Acid 45 % EMUL Apply to lower extremities once daily 10/04/13  Yes Tammi Sou, MD  vitamin C (ASCORBIC ACID) 500 MG tablet Take 500 mg by mouth daily.    Yes Historical Provider, MD   BP 167/50 mmHg  Pulse 56  Temp(Src) 97.6 F (36.4 C) (Oral)  Resp 11  SpO2 99%   Physical Exam  Constitutional: She is oriented to person, place, and time. She appears well-developed and well-nourished.  HENT:  Head: Normocephalic and atraumatic.  Eyes: Conjunctivae are normal. Pupils are equal, round, and reactive to light. Right eye exhibits no discharge. Left eye exhibits no discharge. No scleral icterus.  Neck: Normal range of motion. No JVD present. No tracheal deviation present.  Pulmonary/Chest: Effort normal. No stridor.  Abdominal: Soft. There is tenderness in the epigastric area.  Neurological: She is alert and oriented to person, place, and time. Coordination normal.  Psychiatric: She has a normal mood and affect. Her behavior is normal. Judgment and thought content normal.  Nursing note and vitals reviewed.   ED Course  Procedures (including critical care time) Labs Review Labs Reviewed  BASIC METABOLIC PANEL - Abnormal; Notable for the following:    Potassium 5.3 (*)    CO2 21 (*)    BUN 48 (*)    Creatinine, Ser 1.86 (*)    GFR calc non Af Amer 22 (*)    GFR calc Af Amer 26 (*)    All other components within normal limits  CBC - Abnormal; Notable for the following:    RBC 2.97 (*)    Hemoglobin 9.3 (*)    HCT 27.5 (*)    Platelets 134 (*)    All other components within normal limits  HEPATIC FUNCTION PANEL  LIPASE, BLOOD  I-STAT TROPOININ, ED    Imaging Review Dg Chest 2 View  12/11/2014   CLINICAL DATA:  Chest pain  EXAM: CHEST  2 VIEW  COMPARISON:   09/22/2014  FINDINGS: Cardiomediastinal silhouette is stable. No acute infiltrate or pleural effusion. Osteopenia and degenerative changes thoracic spine.  IMPRESSION: No active cardiopulmonary disease.   Electronically Signed   By: Lahoma Crocker M.D.   On: 12/11/2014 15:24   I, Gurdeep Keesey Todd, personally reviewed and evaluated these images and lab results as part of my medical decision-making.   EKG Interpretation   Date/Time:  Sunday December 11 2014 14:04:27 EDT Ventricular Rate:  61 PR Interval:  264 QRS Duration: 146 QT Interval:  442 QTC Calculation: 445 R Axis:   -14 Text Interpretation:  Sinus rhythm Prolonged PR interval Right bundle  branch block Confirmed by RAY MD, DANIELLE ([id number]) on  12/11/2014 3:00:23 PM      MDM   Final diagnoses:  Epigastric pain    Labs: Troponin, BMP, CBC, renal function- pending  Imaging: CT abdomen and pelvis  Consults:  Therapeutics: Normal saline  Discharge Meds:   Assessment/Plan: Patient presents with likely indigestion, highly unlikely to be cardiac. Patient reports symptoms were relieved with belching, she is asymptomatic at the time of evaluation with the exception of palpation of the abdomen. Due to patient's age and continued pain with palpation she received CT scan for further evaluation of the pain. Pts care signed out to Quincy Carnes at the time of shift change pending CT scan.          Okey Regal, PA-C 12/11/14 1638  Pattricia Boss, MD 12/13/14 [phone]

## 2014-12-11 NOTE — Discharge Instructions (Signed)
Continue regular medications as normal.  Follow-up with GI physician for monitoring of common bile duct stone. Return here for any new or worsening symptoms.

## 2014-12-11 NOTE — ED Notes (Signed)
Per EMS- pt is from [facility] facility. Pt reported chest pain today that was relieved "burping". Pt denies pain at this time. Received 324mg  of asprin with EMS.

## 2014-12-11 NOTE — ED Notes (Signed)
Pt is back from xray.  Instructed pt to drink 1 bottle of contrast 1st hour and then 2nd bottle of contrast 2nd hour.

## 2014-12-11 NOTE — ED Provider Notes (Signed)
Patient received in sign out from PA Hedges at shift change.  Briefly, 81 y.o. F here with epigastric pain that was relieved by burping. Patient denies any pain, however had tenderness to her epigastrium on exam. She is afebrile, nontoxic. Lab work has been obtained, appears baseline for patient when compared with previous. Troponin negative. Chest x-ray is clear.  Patient has no known cardiac hx.  No prior abdominal surgeries.    Plan:  CT abdomen/pelvis with contrast for further evaluation of abdominal pain.  Delta trop to be done at 1750.  If negative, may d/c home.  Results for orders placed or performed during the hospital encounter of [id number]  Basic metabolic panel  Result Value Ref Range   Sodium 138 135 - 145 mmol/L   Potassium 5.3 (H) 3.5 - 5.1 mmol/L   Chloride 106 101 - 111 mmol/L   CO2 21 (L) 22 - 32 mmol/L   Glucose, Bld 96 65 - 99 mg/dL   BUN 48 (H) 6 - 20 mg/dL   Creatinine, Ser 1.86 (H) 0.44 - 1.00 mg/dL   Calcium 9.1 8.9 - 10.3 mg/dL   GFR calc non Af Amer 22 (L) >60 mL/min   GFR calc Af Amer 26 (L) >60 mL/min   Anion gap 11 5 - 15  CBC  Result Value Ref Range   WBC 6.6 4.0 - 10.5 K/uL   RBC 2.97 (L) 3.87 - 5.11 MIL/uL   Hemoglobin 9.3 (L) 12.0 - 15.0 g/dL   HCT 27.5 (L) 36.0 - 46.0 %   MCV 92.6 78.0 - 100.0 fL   MCH 31.3 26.0 - 34.0 pg   MCHC 33.8 30.0 - 36.0 g/dL   RDW 12.7 11.5 - 15.5 %   Platelets 134 (L) 150 - 400 K/uL  Hepatic function panel  Result Value Ref Range   Total Protein 6.6 6.5 - 8.1 g/dL   Albumin 3.5 3.5 - 5.0 g/dL   AST 19 15 - 41 U/L   ALT 10 (L) 14 - 54 U/L   Alkaline Phosphatase 98 38 - 126 U/L   Total Bilirubin 0.4 0.3 - 1.2 mg/dL   Bilirubin, Direct <0.1 (L) 0.1 - 0.5 mg/dL   Indirect Bilirubin NOT CALCULATED 0.3 - 0.9 mg/dL  Lipase, blood  Result Value Ref Range   Lipase 23 22 - 51 U/L  I-stat troponin, ED  Result Value Ref Range   Troponin i, poc 0.02 0.00 - 0.08 ng/mL   Comment 3          I-stat troponin, ED  Result Value  Ref Range   Troponin i, poc 0.02 0.00 - 0.08 ng/mL   Comment 3           Ct Abdomen Pelvis Wo Contrast  12/11/2014   CLINICAL DATA:  Epigastric pain.  Chronic diarrhea.  EXAM: CT ABDOMEN AND PELVIS WITHOUT CONTRAST  TECHNIQUE: Multidetector CT imaging of the abdomen and pelvis was performed following the standard protocol without IV contrast.  COMPARISON:  None.  FINDINGS: Lower chest: Large hiatal hernia. Coronary artery calcification. Mitral valve annulus calcification. Heart size is normal.  Hepatobiliary: Cholecystectomy. There is a 3 x 6 mm nonobstructing stone in the distal common bile duct. Liver parenchyma is normal.  Pancreas: Diffuse pancreatic atrophy.  Spleen: Normal.  Adrenals/Urinary Tract: Kidneys are slightly atrophic. Otherwise normal.  Stomach/Bowel: Large hiatal hernia. Extensive diverticulosis particularly in the hand descending and sigmoid portions of the colon without diverticulitis. Terminal ileum and appendix are normal. Small bowel  is normal.  Vascular/Lymphatic: Extensive aortic atherosclerosis. No adenopathy.  Reproductive: The uterus and ovaries are normal.  Other: No free air or free fluid.  Musculoskeletal: Osteophytes fuse much of the thoracic and upper lumbar spine. No acute osseous abnormality. Moderate arthritis of the hips.  IMPRESSION: Extensive diverticulosis. 3 x 6 mm nonobstructing stone in the distal common bile duct. Otherwise, benign appearing abdomen and pelvis.   Electronically Signed   By: Lorriane Shire M.D.   On: 12/11/2014 19:17   Dg Chest 2 View  12/11/2014   CLINICAL DATA:  Chest pain  EXAM: CHEST  2 VIEW  COMPARISON:  09/22/2014  FINDINGS: Cardiomediastinal silhouette is stable. No acute infiltrate or pleural effusion. Osteopenia and degenerative changes thoracic spine.  IMPRESSION: No active cardiopulmonary disease.   Electronically Signed   By: Lahoma Crocker M.D.   On: 12/11/2014 15:24    Delta trop negative.  CT abdomen pelvis with findings of  non-obstructive CBD stone and diverticulosis but no findings of diverticulitis.  Patient continues to feel well.  She has tolerated full meal and fluids prior to discharge without difficulty.  Will d/c home with GI follow-up.  Discussed plan with patient, he/she acknowledged understanding and agreed with plan of care.  Return precautions given for new or worsening symptoms.  Larene Pickett, PA-C 12/11/14 [id number]  Pattricia Boss, MD 12/13/14 [phone]

## 2014-12-12 DIAGNOSIS — R079 Chest pain, unspecified: Secondary | ICD-10-CM | POA: Diagnosis not present

## 2014-12-15 ENCOUNTER — Encounter: Payer: Self-pay | Admitting: Gastroenterology

## 2014-12-22 ENCOUNTER — Ambulatory Visit (INDEPENDENT_AMBULATORY_CARE_PROVIDER_SITE_OTHER): Payer: Medicare Other | Admitting: Podiatry

## 2014-12-22 ENCOUNTER — Encounter: Payer: Self-pay | Admitting: Podiatry

## 2014-12-22 ENCOUNTER — Telehealth: Payer: Self-pay | Admitting: Family Medicine

## 2014-12-22 DIAGNOSIS — M79676 Pain in unspecified toe(s): Secondary | ICD-10-CM | POA: Diagnosis not present

## 2014-12-22 DIAGNOSIS — B351 Tinea unguium: Secondary | ICD-10-CM | POA: Diagnosis not present

## 2014-12-22 NOTE — Telephone Encounter (Signed)
Please advise 

## 2014-12-22 NOTE — Telephone Encounter (Signed)
Patient had a recent hospital visit. Patient's daughter wants to know if patient needs to follow up with Dr. Anitra Lauth. Please call her back.

## 2014-12-22 NOTE — Progress Notes (Signed)
Patient ID: [name], female   DOB: 05/20/1920, 81 y.o.   MRN: [id number] Complaint:  Visit Type: Patient returns to my office for continued preventative foot care services. Complaint: Patient states" my nails have grown long and thick and become painful to walk and wear shoe. The patient presents for preventative foot care services. No changes to ROS  Podiatric Exam: Vascular: dorsalis pedis and posterior tibial pulses are not palpable bilateral. Capillary return is immediate. Cold feet noted. Sensorium: Normal Semmes Weinstein monofilament test. Normal tactile sensation bilaterally. Nail Exam: Pt has thick disfigured discolored nails with subungual debris noted bilateral entire nail hallux through fifth toenails Ulcer Exam: There is no evidence of ulcer or pre-ulcerative changes or infection. Orthopedic Exam: Muscle tone and strength are WNL. No limitations in general ROM. No crepitus or effusions noted. Foot type and digits show no abnormalities. Bony prominences are unremarkable. Skin: No Porokeratosis. No infection or ulcers  Diagnosis:  Onychomycosis, , Pain in right toe, pain in left toes  Treatment & Plan Procedures and Treatment: Consent by patient was obtained for treatment procedures. The patient understood the discussion of treatment and procedures well. All questions were answered thoroughly reviewed. Debridement of mycotic and hypertrophic toenails, 1 through 5 bilateral and clearing of subungual debris. No ulceration, no infection noted.  Return Visit-Office Procedure: Patient instructed to return to the office for a follow up visit 3 months for continued evaluation and treatment.

## 2014-12-23 NOTE — Telephone Encounter (Signed)
Pls have pt follow up with me any day next week or the week after (and let her daughter know of this plan)-thx

## 2014-12-23 NOTE — Telephone Encounter (Signed)
Left message for Brenda to call back 

## 2014-12-26 NOTE — Telephone Encounter (Signed)
Pts daughter advised and voiced understanding.  

## 2015-01-10 ENCOUNTER — Ambulatory Visit: Payer: Medicare Other

## 2015-01-10 ENCOUNTER — Ambulatory Visit: Payer: Medicare Other | Admitting: Family Medicine

## 2015-01-19 ENCOUNTER — Telehealth: Payer: Self-pay | Admitting: *Deleted

## 2015-01-19 ENCOUNTER — Ambulatory Visit (INDEPENDENT_AMBULATORY_CARE_PROVIDER_SITE_OTHER): Payer: Medicare Other | Admitting: Family Medicine

## 2015-01-19 ENCOUNTER — Ambulatory Visit: Payer: Medicare Other

## 2015-01-19 ENCOUNTER — Encounter: Payer: Self-pay | Admitting: Family Medicine

## 2015-01-19 VITALS — BP 146/63 | HR 57 | Temp 98.2°F | Resp 16 | Wt 116.0 lb

## 2015-01-19 DIAGNOSIS — Z23 Encounter for immunization: Secondary | ICD-10-CM

## 2015-01-19 DIAGNOSIS — N183 Chronic kidney disease, stage 3 unspecified: Secondary | ICD-10-CM

## 2015-01-19 DIAGNOSIS — D631 Anemia in chronic kidney disease: Secondary | ICD-10-CM

## 2015-01-19 DIAGNOSIS — D510 Vitamin B12 deficiency anemia due to intrinsic factor deficiency: Secondary | ICD-10-CM | POA: Diagnosis not present

## 2015-01-19 DIAGNOSIS — K529 Noninfective gastroenteritis and colitis, unspecified: Secondary | ICD-10-CM

## 2015-01-19 DIAGNOSIS — K8050 Calculus of bile duct without cholangitis or cholecystitis without obstruction: Secondary | ICD-10-CM

## 2015-01-19 DIAGNOSIS — N189 Chronic kidney disease, unspecified: Secondary | ICD-10-CM

## 2015-01-19 LAB — CBC WITH DIFFERENTIAL/PLATELET
BASOS ABS: 0.1 10*3/uL (ref 0.0–0.1)
BASOS PCT: 0.8 % (ref 0.0–3.0)
EOS ABS: 0.1 10*3/uL (ref 0.0–0.7)
Eosinophils Relative: 2.2 % (ref 0.0–5.0)
HEMATOCRIT: 29.3 % — AB (ref 36.0–46.0)
HEMOGLOBIN: 9.5 g/dL — AB (ref 12.0–15.0)
LYMPHS PCT: 20.6 % (ref 12.0–46.0)
Lymphs Abs: 1.4 10*3/uL (ref 0.7–4.0)
MCHC: 32.5 g/dL (ref 30.0–36.0)
MCV: 94.8 fl (ref 78.0–100.0)
MONO ABS: 0.5 10*3/uL (ref 0.1–1.0)
Monocytes Relative: 7.9 % (ref 3.0–12.0)
Neutro Abs: 4.7 10*3/uL (ref 1.4–7.7)
Neutrophils Relative %: 68.5 % (ref 43.0–77.0)
Platelets: 229 10*3/uL (ref 150.0–400.0)
RBC: 3.09 Mil/uL — ABNORMAL LOW (ref 3.87–5.11)
RDW: 13.7 % (ref 11.5–15.5)
WBC: 6.9 10*3/uL (ref 4.0–10.5)

## 2015-01-19 LAB — COMPREHENSIVE METABOLIC PANEL
ALBUMIN: 3.3 g/dL — AB (ref 3.5–5.2)
ALT: 6 U/L (ref 0–35)
AST: 12 U/L (ref 0–37)
Alkaline Phosphatase: 89 U/L (ref 39–117)
BILIRUBIN TOTAL: 0.3 mg/dL (ref 0.2–1.2)
BUN: 54 mg/dL — AB (ref 6–23)
CALCIUM: 9.3 mg/dL (ref 8.4–10.5)
CHLORIDE: 106 meq/L (ref 96–112)
CO2: 28 meq/L (ref 19–32)
CREATININE: 2.21 mg/dL — AB (ref 0.40–1.20)
GFR: 22.04 mL/min — ABNORMAL LOW (ref >60.00)
Glucose, Bld: 139 mg/dL — ABNORMAL HIGH (ref 70–99)
Potassium: 6.2 mEq/L (ref 3.5–5.1)
SODIUM: 143 meq/L (ref 135–145)
Total Protein: 6.2 g/dL (ref 6.0–8.3)

## 2015-01-19 LAB — LIPASE: LIPASE: 19 U/L (ref 11.0–59.0)

## 2015-01-19 MED ORDER — CYANOCOBALAMIN 1000 MCG/ML IJ SOLN
1000.0000 ug | Freq: Once | INTRAMUSCULAR | Status: AC
  Administered 2015-01-19: 1000 ug via INTRAMUSCULAR

## 2015-01-19 NOTE — Progress Notes (Signed)
Pre visit review using our clinic review tool, if applicable. No additional management support is needed unless otherwise documented below in the visit note. 

## 2015-01-19 NOTE — Progress Notes (Signed)
OFFICE VISIT  01/19/2015   CC:  Chief Complaint  Patient presents with  . Follow-up    Per daughter needs ov before apt with urology in October  . B12 Injection   HPI:    Patient is a 81 y.o. Caucasian female who presents with an attendant from her NH for f/u, unclear exactly why b/c daughter not with her today and her attendant from her NH has no info about this.  I think it is likely a f/u from 12/11/14 ED visit in which she had some epigastric pain and was found to have a non-obstructing gallstone in CBD.  No intervention was done and she felt back to normal prior to leaving the ED per ED Jacayla Nordell notes.  GI app with Dr. Ardis Hughs set for 02/14/15.  Last visit I started her on imodium 2mg , 2 tabs once a day and she says she no longer has diarrhea and she has NORMAL BM's per her report.  She is eating well without any abd pain.  Past Medical History  Diagnosis Date  . Allergic rhinitis, cause unspecified   . Unspecified hearing loss   . Cerebrovascular disease, unspecified     CDopplers 1/13 showed mild mixed plaque w/ 40-59% (low end) bilat ICA stenoses, the prev right CAE & patch angioplasty were patent  . Unspecified venous (peripheral) insufficiency   . Pure hypercholesterolemia   . Unspecified disorder resulting from impaired renal function     hyperkalemia and anemia  . Diaphragmatic hernia without mention of obstruction or gangrene   . Unspecified vitamin D deficiency   . Unspecified cerebral artery occlusion with cerebral infarction     MRI Brain 12/12 showed large remote right hemispheric infarct w/ encephalomalacia w/ prominent small vessel dis superimposed on atrophy... (residual deficits are diminished sensation on left side, left hand grip weakness,   . Dementia with behavioral disturbance     depakote helpful  . Pernicious anemia   . Cataracts, bilateral   . Retinal hemorrhage of right eye   . Chronic renal insufficiency, stage III (moderate)     Borderline stage IV  (CrCl around 30 ml/min)  . Essential hypertension   . Choledocholithiasis     12/11/14 CT--nonobstructive (8mm x 6 mm)    Past Surgical History  Procedure Laterality Date  . Cholecystectomy    . Carotid endarterectomy    . Laser surgery on right eye  12/02/2012    Dr. [facility]    Outpatient Prescriptions Prior to Visit  Medication Sig Dispense Refill  . acetaminophen (TYLENOL) 500 MG tablet Take 1,000 mg by mouth at bedtime.     Marland Kitchen amLODipine (NORVASC) 5 MG tablet Take 1 tablet (5 mg total) by mouth daily. 30 tablet 11  . cetirizine (ZYRTEC) 10 MG tablet Take 10 mg by mouth daily.    . clopidogrel (PLAVIX) 75 MG tablet Take 1 tablet (75 mg total) by mouth daily. 30 tablet 3  . divalproex (DEPAKOTE SPRINKLE) 125 MG capsule TAKE 1 CAPSULE TWICE DAILY (Patient taking differently: Take 125 mg by mouth 2 (two) times daily. ) 60 capsule 5  . ENSURE PLUS (ENSURE PLUS) LIQD Take 237 mLs by mouth daily.    . furosemide (LASIX) 20 MG tablet Take 1 tablet (20 mg total) by mouth daily. 30 tablet 6  . Multiple Vitamin (MULTIVITAMIN) capsule Take 1 capsule by mouth daily.      Marland Kitchen oxybutynin (DITROPAN) 5 MG tablet Take 1 tablet (5 mg total) by mouth  2 (two) times daily. (Patient taking differently: Take 5 mg by mouth daily. Taking once daily (06/09/14)) 60 tablet 11  . ranitidine ([employer] RANITIDINE) 75 MG tablet TAKE ONE TABLET BY MOUTH AT BEDTIME (Patient taking differently: TAKE ONE TABLET BY MOUTH AT DAILY) 30 tablet 3  . sodium chloride (OCEAN) 0.65 % nasal spray Place 1 spray into the nose at bedtime as needed for congestion.     . Urea in Zn Undecyl-Lactic Acid 45 % EMUL Apply to lower extremities once daily 240 mL 6  . vitamin C (ASCORBIC ACID) 500 MG tablet Take 500 mg by mouth daily.      No facility-administered medications prior to visit.    Allergies  Allergen Reactions  . Procaine Hcl     REACTION: pt states reaction to shot at dentist office w/ tachycardia \\T \ SOB (? if  really from combination w/epi)    ROS As per HPI  PE: Blood pressure 146/63, pulse 57, temperature 98.2 F (36.8 C), temperature source Oral, resp. rate 16, weight 116 lb (52.617 kg), SpO2 98 %. Gen: Alert, well appearing.  Patient is oriented to person, place, and situation. Smiling and interactive as per her usual. CV: RRR, 2/6 systolic ejection murmur at cardiac base, no rub or gallop. Chest is clear, no wheezing or rales. Normal symmetric air entry throughout both lung fields. No chest wall deformities or tenderness. ABd: soft, NT/ND, BS normal EXT: 2+ pitting edema in both LL's, wearing knee high compression stockings, no erythema.   LABS:    Chemistry      Component Value Date/Time   NA 138 12/11/2014 1435   K 5.3* 12/11/2014 1435   CL 106 12/11/2014 1435   CO2 21* 12/11/2014 1435   BUN 48* 12/11/2014 1435   CREATININE 1.86* 12/11/2014 1435      Component Value Date/Time   CALCIUM 9.1 12/11/2014 1435   ALKPHOS 98 12/11/2014 1435   AST 19 12/11/2014 1435   ALT 10* 12/11/2014 1435   BILITOT 0.4 12/11/2014 1435     Lab Results  Component Value Date   WBC 6.6 12/11/2014   HGB 9.3* 12/11/2014   HCT 27.5* 12/11/2014   MCV 92.6 12/11/2014   PLT 134* 12/11/2014   Lab Results  Component Value Date   LIPASE 23 12/11/2014    IMPRESSION AND PLAN:  1) Choledocholithiasis; asymptomatic.  The stone that was present on CT 12/11/14 could very well have dissolved or passed already.  Will recheck CBC, CMET, and lipase today. She'll keep GI appt set for about a month from now to f/u this problem--I deferred any re-imaging of the biliary system at this time.  2) Chronic diarrhea: resolved on low dose imodium.  Having normal, formed BMs now per pt and attendant report.  3) CRI stage III/IV: recheck BMET today. She has chronic anemia related to this. Will monitor Hb today.  Iron studies 08/2014 were normal.  4) Vit B12 deficiency/hx of pernicious anemia: vit b12 injection IM  1000 mcg given today. She gets this monthly here.  5) Prev health care: flu vaccine ("regular dose") given today.  An After Visit Summary was printed and given to the patient.  FOLLOW UP: Return in about 4 months (around 05/21/2015) for routine chronic illness f/u (30 min).

## 2015-01-19 NOTE — Telephone Encounter (Signed)
Gill with [facility] called with potassium result for pt. Potassium 6.2. Please advise. Thanks.

## 2015-01-20 ENCOUNTER — Other Ambulatory Visit: Payer: Self-pay | Admitting: Family Medicine

## 2015-01-20 DIAGNOSIS — N184 Chronic kidney disease, stage 4 (severe): Secondary | ICD-10-CM

## 2015-01-20 DIAGNOSIS — E875 Hyperkalemia: Secondary | ICD-10-CM

## 2015-01-20 DIAGNOSIS — D631 Anemia in chronic kidney disease: Secondary | ICD-10-CM

## 2015-01-20 DIAGNOSIS — N189 Chronic kidney disease, unspecified: Secondary | ICD-10-CM

## 2015-01-23 ENCOUNTER — Other Ambulatory Visit (INDEPENDENT_AMBULATORY_CARE_PROVIDER_SITE_OTHER): Payer: Medicare Other

## 2015-01-23 DIAGNOSIS — E875 Hyperkalemia: Secondary | ICD-10-CM

## 2015-01-23 DIAGNOSIS — R32 Unspecified urinary incontinence: Secondary | ICD-10-CM | POA: Diagnosis not present

## 2015-01-23 LAB — BASIC METABOLIC PANEL
BUN: 55 mg/dL — ABNORMAL HIGH (ref 6–23)
CALCIUM: 9.4 mg/dL (ref 8.4–10.5)
CO2: 29 mEq/L (ref 19–32)
Chloride: 102 mEq/L (ref 96–112)
Creatinine, Ser: 2.14 mg/dL — ABNORMAL HIGH (ref 0.40–1.20)
GFR: 22.88 mL/min — AB (ref >60.00)
GLUCOSE: 86 mg/dL (ref 70–99)
POTASSIUM: 5.5 meq/L — AB (ref 3.5–5.1)
SODIUM: 141 meq/L (ref 135–145)

## 2015-01-28 DIAGNOSIS — Z6820 Body mass index (BMI) 20.0-20.9, adult: Secondary | ICD-10-CM | POA: Diagnosis not present

## 2015-01-28 DIAGNOSIS — R634 Abnormal weight loss: Secondary | ICD-10-CM | POA: Diagnosis not present

## 2015-01-28 DIAGNOSIS — I872 Venous insufficiency (chronic) (peripheral): Secondary | ICD-10-CM | POA: Diagnosis not present

## 2015-01-28 DIAGNOSIS — D510 Vitamin B12 deficiency anemia due to intrinsic factor deficiency: Secondary | ICD-10-CM | POA: Diagnosis not present

## 2015-01-28 DIAGNOSIS — E875 Hyperkalemia: Secondary | ICD-10-CM | POA: Diagnosis not present

## 2015-01-28 DIAGNOSIS — I129 Hypertensive chronic kidney disease with stage 1 through stage 4 chronic kidney disease, or unspecified chronic kidney disease: Secondary | ICD-10-CM | POA: Diagnosis not present

## 2015-01-28 DIAGNOSIS — N183 Chronic kidney disease, stage 3 (moderate): Secondary | ICD-10-CM | POA: Diagnosis not present

## 2015-01-28 DIAGNOSIS — F0390 Unspecified dementia without behavioral disturbance: Secondary | ICD-10-CM | POA: Diagnosis not present

## 2015-02-02 DIAGNOSIS — F0390 Unspecified dementia without behavioral disturbance: Secondary | ICD-10-CM | POA: Diagnosis not present

## 2015-02-02 DIAGNOSIS — I872 Venous insufficiency (chronic) (peripheral): Secondary | ICD-10-CM | POA: Diagnosis not present

## 2015-02-02 DIAGNOSIS — D510 Vitamin B12 deficiency anemia due to intrinsic factor deficiency: Secondary | ICD-10-CM | POA: Diagnosis not present

## 2015-02-02 DIAGNOSIS — Z6820 Body mass index (BMI) 20.0-20.9, adult: Secondary | ICD-10-CM | POA: Diagnosis not present

## 2015-02-02 DIAGNOSIS — R634 Abnormal weight loss: Secondary | ICD-10-CM | POA: Diagnosis not present

## 2015-02-02 DIAGNOSIS — E875 Hyperkalemia: Secondary | ICD-10-CM | POA: Diagnosis not present

## 2015-02-02 DIAGNOSIS — I129 Hypertensive chronic kidney disease with stage 1 through stage 4 chronic kidney disease, or unspecified chronic kidney disease: Secondary | ICD-10-CM | POA: Diagnosis not present

## 2015-02-02 DIAGNOSIS — N183 Chronic kidney disease, stage 3 (moderate): Secondary | ICD-10-CM | POA: Diagnosis not present

## 2015-02-03 DIAGNOSIS — N183 Chronic kidney disease, stage 3 (moderate): Secondary | ICD-10-CM | POA: Diagnosis not present

## 2015-02-03 DIAGNOSIS — D510 Vitamin B12 deficiency anemia due to intrinsic factor deficiency: Secondary | ICD-10-CM | POA: Diagnosis not present

## 2015-02-03 DIAGNOSIS — I129 Hypertensive chronic kidney disease with stage 1 through stage 4 chronic kidney disease, or unspecified chronic kidney disease: Secondary | ICD-10-CM | POA: Diagnosis not present

## 2015-02-03 DIAGNOSIS — E875 Hyperkalemia: Secondary | ICD-10-CM | POA: Diagnosis not present

## 2015-02-03 DIAGNOSIS — R634 Abnormal weight loss: Secondary | ICD-10-CM | POA: Diagnosis not present

## 2015-02-03 DIAGNOSIS — I872 Venous insufficiency (chronic) (peripheral): Secondary | ICD-10-CM | POA: Diagnosis not present

## 2015-02-03 DIAGNOSIS — Z6820 Body mass index (BMI) 20.0-20.9, adult: Secondary | ICD-10-CM | POA: Diagnosis not present

## 2015-02-03 DIAGNOSIS — F0390 Unspecified dementia without behavioral disturbance: Secondary | ICD-10-CM | POA: Diagnosis not present

## 2015-02-08 DIAGNOSIS — D510 Vitamin B12 deficiency anemia due to intrinsic factor deficiency: Secondary | ICD-10-CM | POA: Diagnosis not present

## 2015-02-08 DIAGNOSIS — R634 Abnormal weight loss: Secondary | ICD-10-CM | POA: Diagnosis not present

## 2015-02-08 DIAGNOSIS — E875 Hyperkalemia: Secondary | ICD-10-CM | POA: Diagnosis not present

## 2015-02-08 DIAGNOSIS — N183 Chronic kidney disease, stage 3 (moderate): Secondary | ICD-10-CM | POA: Diagnosis not present

## 2015-02-08 DIAGNOSIS — I129 Hypertensive chronic kidney disease with stage 1 through stage 4 chronic kidney disease, or unspecified chronic kidney disease: Secondary | ICD-10-CM | POA: Diagnosis not present

## 2015-02-08 DIAGNOSIS — I872 Venous insufficiency (chronic) (peripheral): Secondary | ICD-10-CM | POA: Diagnosis not present

## 2015-02-08 DIAGNOSIS — Z6820 Body mass index (BMI) 20.0-20.9, adult: Secondary | ICD-10-CM | POA: Diagnosis not present

## 2015-02-08 DIAGNOSIS — F0390 Unspecified dementia without behavioral disturbance: Secondary | ICD-10-CM | POA: Diagnosis not present

## 2015-02-09 DIAGNOSIS — E875 Hyperkalemia: Secondary | ICD-10-CM | POA: Diagnosis not present

## 2015-02-10 DIAGNOSIS — I129 Hypertensive chronic kidney disease with stage 1 through stage 4 chronic kidney disease, or unspecified chronic kidney disease: Secondary | ICD-10-CM | POA: Diagnosis not present

## 2015-02-10 DIAGNOSIS — I872 Venous insufficiency (chronic) (peripheral): Secondary | ICD-10-CM | POA: Diagnosis not present

## 2015-02-10 DIAGNOSIS — R634 Abnormal weight loss: Secondary | ICD-10-CM | POA: Diagnosis not present

## 2015-02-10 DIAGNOSIS — D510 Vitamin B12 deficiency anemia due to intrinsic factor deficiency: Secondary | ICD-10-CM | POA: Diagnosis not present

## 2015-02-10 DIAGNOSIS — E875 Hyperkalemia: Secondary | ICD-10-CM | POA: Diagnosis not present

## 2015-02-10 DIAGNOSIS — Z6820 Body mass index (BMI) 20.0-20.9, adult: Secondary | ICD-10-CM | POA: Diagnosis not present

## 2015-02-10 DIAGNOSIS — N183 Chronic kidney disease, stage 3 (moderate): Secondary | ICD-10-CM | POA: Diagnosis not present

## 2015-02-10 DIAGNOSIS — F0390 Unspecified dementia without behavioral disturbance: Secondary | ICD-10-CM | POA: Diagnosis not present

## 2015-02-14 ENCOUNTER — Encounter: Payer: Self-pay | Admitting: Gastroenterology

## 2015-02-14 ENCOUNTER — Ambulatory Visit (INDEPENDENT_AMBULATORY_CARE_PROVIDER_SITE_OTHER): Payer: Medicare Other | Admitting: Gastroenterology

## 2015-02-14 VITALS — BP 120/60 | HR 72 | Ht 64.0 in | Wt 119.8 lb

## 2015-02-14 DIAGNOSIS — K8050 Calculus of bile duct without cholangitis or cholecystitis without obstruction: Secondary | ICD-10-CM

## 2015-02-14 NOTE — Progress Notes (Signed)
HPI: This is a  very pleasant 81 year old woman   who was referred to me by Tammi Sou, MD  to evaluate  common bile duct stones .    Chief complaint is common bile duct stones  Ct 11/2014 shows small stone in the CBD; done for epig pain;   Normal lfts, Cr 2.2 most recently.  Went to ER due to sharp epigastric pains while eating.  No associated nausea or vomiting.  Was the first time she'd had that pains.  Has been losing weight in past several months (30 pounds or so).  Renal dysfunction.  Has been having loose stools, has been for 6 months or so.    Was started on imodium, takes one pill every morning and this helps.  Slightly demented.  Poor dentition, needs food cut up very well.  She has dysphagia occasionally.  No more pains since then.  Walks with a walker  Frail but pretty good historian  Hard of hearing.  Review of systems: Pertinent positive and negative review of systems were noted in the above HPI section. Complete review of systems was performed and was otherwise normal.   Past Medical History  Diagnosis Date  . Allergic rhinitis, cause unspecified   . Unspecified hearing loss   . Cerebrovascular disease, unspecified     CDopplers 1/13 showed mild mixed plaque w/ 40-59% (low end) bilat ICA stenoses, the prev right CAE & patch angioplasty were patent  . Unspecified venous (peripheral) insufficiency   . Pure hypercholesterolemia   . Unspecified disorder resulting from impaired renal function     hyperkalemia and anemia  . Diaphragmatic hernia without mention of obstruction or gangrene   . Unspecified vitamin D deficiency   . Unspecified cerebral artery occlusion with cerebral infarction     MRI Brain 12/12 showed large remote right hemispheric infarct w/ encephalomalacia w/ prominent small vessel dis superimposed on atrophy... (residual deficits are diminished sensation on left side, left hand grip weakness,   . Dementia with behavioral disturbance      depakote helpful  . Pernicious anemia   . Cataracts, bilateral   . Retinal hemorrhage of right eye   . Chronic renal insufficiency, stage III (moderate)     Borderline stage IV (CrCl around 30 ml/min)  . Essential hypertension   . Choledocholithiasis     12/11/14 CT--nonobstructive (78mm x 6 mm)    Past Surgical History  Procedure Laterality Date  . Cholecystectomy    . Carotid endarterectomy    . Laser surgery on right eye  12/02/2012    Dr. [facility]aird [facility] at [facility]    Current Outpatient Prescriptions  Medication Sig Dispense Refill  . acetaminophen (TYLENOL) 500 MG tablet Take 1,000 mg by mouth at bedtime.     Marland Kitchen amLODipine (NORVASC) 5 MG tablet Take 1 tablet (5 mg total) by mouth daily. 30 tablet 11  . cetirizine (ZYRTEC) 10 MG tablet Take 10 mg by mouth daily.    . clopidogrel (PLAVIX) 75 MG tablet Take 1 tablet (75 mg total) by mouth daily. 30 tablet 3  . divalproex (DEPAKOTE SPRINKLE) 125 MG capsule TAKE 1 CAPSULE TWICE DAILY (Patient taking differently: Take 125 mg by mouth 2 (two) times daily. ) 60 capsule 5  . ENSURE PLUS (ENSURE PLUS) LIQD Take 237 mLs by mouth daily.    . furosemide (LASIX) 20 MG tablet Take 1 tablet (20 mg total) by mouth daily. 30 tablet 6  . Multiple Vitamin (MULTIVITAMIN) capsule Take 1 capsule  by mouth daily.      Marland Kitchen oxybutynin (DITROPAN) 5 MG tablet Take 1 tablet (5 mg total) by mouth 2 (two) times daily. (Patient taking differently: Take 5 mg by mouth daily. Taking once daily (06/09/14)) 60 tablet 11  . ranitidine ([employer] RANITIDINE) 75 MG tablet TAKE ONE TABLET BY MOUTH AT BEDTIME (Patient taking differently: TAKE ONE TABLET BY MOUTH AT DAILY) 30 tablet 3  . sodium chloride (OCEAN) 0.65 % nasal spray Place 1 spray into the nose at bedtime as needed for congestion.     . Urea in Zn Undecyl-Lactic Acid 45 % EMUL Apply to lower extremities once daily 240 mL 6  . vitamin C (ASCORBIC ACID) 500 MG tablet Take 500 mg by mouth daily.      No current  facility-administered medications for this visit.    Allergies as of 02/14/2015 - Review Complete 02/14/2015  Allergen Reaction Noted  . Procaine hcl      Family History  Problem Relation Age of Onset  . Heart disease Mother   . Heart disease Father     Social History   Social History  . Marital Status: Widowed    Spouse Name: N/A  . Number of Children: N/A  . Years of Education: N/A   Occupational History  . Not on file.   Social History Main Topics  . Smoking status: Never Smoker   . Smokeless tobacco: Never Used  . Alcohol Use: No  . Drug Use: No  . Sexual Activity: Not on file   Other Topics Concern  . Not on file   Social History Narrative   Lives in [facility] memory unit.   HC POA is daughter--Brenda Tresa Endo, and/or son Billee Cashing.   Ambulates with rolling walker and manual wheelchair.     Physical Exam: BP 120/60 mmHg  Pulse 72  Ht 5\' 4"  (1.626 m)  Wt 119 lb 12.8 oz (54.341 kg)  BMI 20.55 kg/m2 Constitutional: Frail, elderly woman who is very hard of hearing Psychiatric: alert and oriented x3 Eyes: extraocular movements intact Mouth: oral pharynx moist, no lesions Neck: supple no lymphadenopathy Cardiovascular: heart regular rate and rhythm Lungs: clear to auscultation bilaterally Abdomen: soft, nontender, nondistended, no obvious ascites, no peritoneal signs, normal bowel sounds Extremities: no lower extremity edema bilaterally Skin: no lesions on visible extremities   Assessment and plan: 81 y.o. female with  common bile duct stone probably symptomatic  CAT scan showed a pretty clear common bile duct stone. Her LFTs were normal at that time but the stone may have caused the epigastric pain which caused her to go to the emergency room that day. She has not been bothered by pain since and has not had pains before then. Her gallbladder is out. I had a long discussion with her and her daughter. She is very hard of hearing but I think she  understands the essence of what I was trying to explain. ERCP is normally recommended for any bile duct stones. She is 71 and is frail. She will have to hold her Plavix for 5 days prior. Obviously the procedure would be riskier for her than is usual. I did explain that I think we can get her through this safely out of take all precautions as usual to minimize the chances of complication during the procedures. She her daughter agreed and understood to proceed. We will get Plavix holding clearance from her primary care physician.   Owens Loffler, MD [facility] 02/14/2015, 10:27 AM  Cc: Tammi Sou, MD

## 2015-02-14 NOTE — Patient Instructions (Signed)
ERCP for stones in the bile duct at [facility] with anesthesia assistence. You will need to hold your plavix for 5 days prior to the procedure (we will contact your PCP about the safety of holding your plavix).

## 2015-02-15 DIAGNOSIS — F0390 Unspecified dementia without behavioral disturbance: Secondary | ICD-10-CM | POA: Diagnosis not present

## 2015-02-15 DIAGNOSIS — Z6820 Body mass index (BMI) 20.0-20.9, adult: Secondary | ICD-10-CM | POA: Diagnosis not present

## 2015-02-15 DIAGNOSIS — E875 Hyperkalemia: Secondary | ICD-10-CM | POA: Diagnosis not present

## 2015-02-15 DIAGNOSIS — R634 Abnormal weight loss: Secondary | ICD-10-CM | POA: Diagnosis not present

## 2015-02-15 DIAGNOSIS — I129 Hypertensive chronic kidney disease with stage 1 through stage 4 chronic kidney disease, or unspecified chronic kidney disease: Secondary | ICD-10-CM | POA: Diagnosis not present

## 2015-02-15 DIAGNOSIS — N183 Chronic kidney disease, stage 3 (moderate): Secondary | ICD-10-CM | POA: Diagnosis not present

## 2015-02-15 DIAGNOSIS — D510 Vitamin B12 deficiency anemia due to intrinsic factor deficiency: Secondary | ICD-10-CM | POA: Diagnosis not present

## 2015-02-15 DIAGNOSIS — I872 Venous insufficiency (chronic) (peripheral): Secondary | ICD-10-CM | POA: Diagnosis not present

## 2015-02-21 ENCOUNTER — Ambulatory Visit: Payer: Medicare Other

## 2015-02-23 ENCOUNTER — Ambulatory Visit (INDEPENDENT_AMBULATORY_CARE_PROVIDER_SITE_OTHER): Payer: Medicare Other | Admitting: *Deleted

## 2015-02-23 DIAGNOSIS — F0390 Unspecified dementia without behavioral disturbance: Secondary | ICD-10-CM | POA: Diagnosis not present

## 2015-02-23 DIAGNOSIS — D510 Vitamin B12 deficiency anemia due to intrinsic factor deficiency: Secondary | ICD-10-CM | POA: Diagnosis not present

## 2015-02-23 DIAGNOSIS — E875 Hyperkalemia: Secondary | ICD-10-CM | POA: Diagnosis not present

## 2015-02-23 DIAGNOSIS — E538 Deficiency of other specified B group vitamins: Secondary | ICD-10-CM

## 2015-02-23 DIAGNOSIS — Z6820 Body mass index (BMI) 20.0-20.9, adult: Secondary | ICD-10-CM | POA: Diagnosis not present

## 2015-02-23 DIAGNOSIS — R634 Abnormal weight loss: Secondary | ICD-10-CM | POA: Diagnosis not present

## 2015-02-23 DIAGNOSIS — N183 Chronic kidney disease, stage 3 (moderate): Secondary | ICD-10-CM | POA: Diagnosis not present

## 2015-02-23 DIAGNOSIS — I872 Venous insufficiency (chronic) (peripheral): Secondary | ICD-10-CM | POA: Diagnosis not present

## 2015-02-23 DIAGNOSIS — I129 Hypertensive chronic kidney disease with stage 1 through stage 4 chronic kidney disease, or unspecified chronic kidney disease: Secondary | ICD-10-CM | POA: Diagnosis not present

## 2015-02-23 MED ORDER — CYANOCOBALAMIN 1000 MCG/ML IJ SOLN
1000.0000 ug | Freq: Once | INTRAMUSCULAR | Status: AC
  Administered 2015-02-23: 1000 ug via INTRAMUSCULAR

## 2015-02-23 NOTE — Progress Notes (Signed)
Patient presents today for B 12 injection. Patient tolerated injection well. 

## 2015-02-28 ENCOUNTER — Telehealth: Payer: Self-pay | Admitting: Family Medicine

## 2015-02-28 ENCOUNTER — Telehealth: Payer: Self-pay

## 2015-02-28 ENCOUNTER — Telehealth: Payer: Self-pay | Admitting: *Deleted

## 2015-02-28 NOTE — Telephone Encounter (Signed)
-----   Message from Barron Alvine, [place] sent at 02/28/2015  9:46 AM EDT ----- Waiting for anti coag response from Dr Anitra Lauth ERCP 03/09/15

## 2015-02-28 NOTE — Telephone Encounter (Signed)
Message left at all available numbers.

## 2015-02-28 NOTE — Telephone Encounter (Signed)
Letter resent to Dr Anitra Lauth and call placed to the office and received the verbal ok to hold prior to the ERCP.  Dr Anitra Lauth will send the ok via EPIC as well.  Message left at the home number and the daughters number

## 2015-02-28 NOTE — Telephone Encounter (Signed)
See alternate note  

## 2015-02-28 NOTE — Telephone Encounter (Signed)
Pt daughter has been notified and will call with any concerns or questions

## 2015-02-28 NOTE — Telephone Encounter (Signed)
Carrie Grimes with [facility] on 02/28/15 at 10:41am requesting verbal order to extend pts nursing service x 3 weeks. Per Dr. Anitra Lauth okay to extend services x 3 weeks. Carrie Grimes advised and voiced understanding.

## 2015-02-28 NOTE — Telephone Encounter (Signed)
Patient ok to hold Plavix beginning 5 days prior to her upcoming ERCP that is scheduled with your office.-thx

## 2015-03-02 DIAGNOSIS — I129 Hypertensive chronic kidney disease with stage 1 through stage 4 chronic kidney disease, or unspecified chronic kidney disease: Secondary | ICD-10-CM | POA: Diagnosis not present

## 2015-03-02 DIAGNOSIS — Z6820 Body mass index (BMI) 20.0-20.9, adult: Secondary | ICD-10-CM | POA: Diagnosis not present

## 2015-03-02 DIAGNOSIS — R634 Abnormal weight loss: Secondary | ICD-10-CM | POA: Diagnosis not present

## 2015-03-02 DIAGNOSIS — D510 Vitamin B12 deficiency anemia due to intrinsic factor deficiency: Secondary | ICD-10-CM | POA: Diagnosis not present

## 2015-03-02 DIAGNOSIS — F0390 Unspecified dementia without behavioral disturbance: Secondary | ICD-10-CM | POA: Diagnosis not present

## 2015-03-02 DIAGNOSIS — E875 Hyperkalemia: Secondary | ICD-10-CM | POA: Diagnosis not present

## 2015-03-02 DIAGNOSIS — N183 Chronic kidney disease, stage 3 (moderate): Secondary | ICD-10-CM | POA: Diagnosis not present

## 2015-03-02 DIAGNOSIS — I872 Venous insufficiency (chronic) (peripheral): Secondary | ICD-10-CM | POA: Diagnosis not present

## 2015-03-08 ENCOUNTER — Encounter (HOSPITAL_COMMUNITY): Payer: Self-pay | Admitting: *Deleted

## 2015-03-08 NOTE — Progress Notes (Signed)
Verified with Javier Glazier- final pre procedure  instructions faxed.

## 2015-03-09 ENCOUNTER — Ambulatory Visit (HOSPITAL_COMMUNITY)
Admission: RE | Admit: 2015-03-09 | Discharge: 2015-03-09 | Disposition: A | Discharge status: Home / Self Care | Payer: Medicare Other | Source: Ambulatory Visit | Attending: Gastroenterology | Admitting: Gastroenterology

## 2015-03-09 ENCOUNTER — Ambulatory Visit (HOSPITAL_COMMUNITY): Payer: Medicare Other | Admitting: Certified Registered Nurse Anesthetist

## 2015-03-09 ENCOUNTER — Encounter (HOSPITAL_COMMUNITY): Payer: Self-pay | Admitting: Certified Registered Nurse Anesthetist

## 2015-03-09 ENCOUNTER — Ambulatory Visit (HOSPITAL_COMMUNITY): Payer: Medicare Other

## 2015-03-09 ENCOUNTER — Ambulatory Visit: Payer: Medicare Other | Admitting: Family Medicine

## 2015-03-09 ENCOUNTER — Encounter (HOSPITAL_COMMUNITY)
Admission: RE | Disposition: A | Discharge status: Home / Self Care | Payer: Self-pay | Source: Ambulatory Visit | Attending: Gastroenterology

## 2015-03-09 DIAGNOSIS — K5710 Diverticulosis of small intestine without perforation or abscess without bleeding: Secondary | ICD-10-CM | POA: Diagnosis not present

## 2015-03-09 DIAGNOSIS — I129 Hypertensive chronic kidney disease with stage 1 through stage 4 chronic kidney disease, or unspecified chronic kidney disease: Secondary | ICD-10-CM | POA: Diagnosis not present

## 2015-03-09 DIAGNOSIS — N183 Chronic kidney disease, stage 3 (moderate): Secondary | ICD-10-CM | POA: Insufficient documentation

## 2015-03-09 DIAGNOSIS — R1310 Dysphagia, unspecified: Secondary | ICD-10-CM | POA: Insufficient documentation

## 2015-03-09 DIAGNOSIS — R634 Abnormal weight loss: Secondary | ICD-10-CM | POA: Insufficient documentation

## 2015-03-09 DIAGNOSIS — I739 Peripheral vascular disease, unspecified: Secondary | ICD-10-CM | POA: Diagnosis not present

## 2015-03-09 DIAGNOSIS — Z6822 Body mass index (BMI) 22.0-22.9, adult: Secondary | ICD-10-CM | POA: Diagnosis not present

## 2015-03-09 DIAGNOSIS — Z7902 Long term (current) use of antithrombotics/antiplatelets: Secondary | ICD-10-CM | POA: Insufficient documentation

## 2015-03-09 DIAGNOSIS — G709 Myoneural disorder, unspecified: Secondary | ICD-10-CM | POA: Diagnosis not present

## 2015-03-09 DIAGNOSIS — I252 Old myocardial infarction: Secondary | ICD-10-CM | POA: Diagnosis not present

## 2015-03-09 DIAGNOSIS — K219 Gastro-esophageal reflux disease without esophagitis: Secondary | ICD-10-CM | POA: Insufficient documentation

## 2015-03-09 DIAGNOSIS — K449 Diaphragmatic hernia without obstruction or gangrene: Secondary | ICD-10-CM | POA: Diagnosis not present

## 2015-03-09 DIAGNOSIS — H269 Unspecified cataract: Secondary | ICD-10-CM | POA: Diagnosis not present

## 2015-03-09 DIAGNOSIS — F0390 Unspecified dementia without behavioral disturbance: Secondary | ICD-10-CM | POA: Insufficient documentation

## 2015-03-09 DIAGNOSIS — H9190 Unspecified hearing loss, unspecified ear: Secondary | ICD-10-CM | POA: Insufficient documentation

## 2015-03-09 DIAGNOSIS — Z79899 Other long term (current) drug therapy: Secondary | ICD-10-CM | POA: Insufficient documentation

## 2015-03-09 DIAGNOSIS — R197 Diarrhea, unspecified: Secondary | ICD-10-CM | POA: Insufficient documentation

## 2015-03-09 DIAGNOSIS — E7800 Pure hypercholesterolemia, unspecified: Secondary | ICD-10-CM | POA: Diagnosis not present

## 2015-03-09 DIAGNOSIS — Z8673 Personal history of transient ischemic attack (TIA), and cerebral infarction without residual deficits: Secondary | ICD-10-CM | POA: Insufficient documentation

## 2015-03-09 DIAGNOSIS — K8050 Calculus of bile duct without cholangitis or cholecystitis without obstruction: Secondary | ICD-10-CM | POA: Insufficient documentation

## 2015-03-09 DIAGNOSIS — N189 Chronic kidney disease, unspecified: Secondary | ICD-10-CM | POA: Diagnosis not present

## 2015-03-09 DIAGNOSIS — M1990 Unspecified osteoarthritis, unspecified site: Secondary | ICD-10-CM | POA: Diagnosis not present

## 2015-03-09 DIAGNOSIS — R1013 Epigastric pain: Secondary | ICD-10-CM | POA: Diagnosis present

## 2015-03-09 HISTORY — DX: Other reduced mobility: Z74.09

## 2015-03-09 HISTORY — PX: ENDOSCOPIC RETROGRADE CHOLANGIOPANCREATOGRAPHY (ERCP) WITH PROPOFOL: SHX5810

## 2015-03-09 SURGERY — ENDOSCOPIC RETROGRADE CHOLANGIOPANCREATOGRAPHY (ERCP) WITH PROPOFOL
Anesthesia: General

## 2015-03-09 MED ORDER — LACTATED RINGERS IV SOLN: INTRAVENOUS | Status: DC

## 2015-03-09 MED ORDER — EPHEDRINE SULFATE 50 MG/ML IJ SOLN
INTRAMUSCULAR | Status: AC
  Filled 2015-03-09: qty 1

## 2015-03-09 MED ORDER — INDOMETHACIN 50 MG RE SUPP
100.0000 mg | Freq: Once | RECTAL | Status: AC
  Administered 2015-03-09: 100 mg via RECTAL

## 2015-03-09 MED ORDER — ONDANSETRON HCL 4 MG/2ML IJ SOLN
INTRAMUSCULAR | Status: DC | PRN
  Administered 2015-03-09: 4 mg via INTRAVENOUS

## 2015-03-09 MED ORDER — SUCCINYLCHOLINE CHLORIDE 20 MG/ML IJ SOLN
INTRAMUSCULAR | Status: DC | PRN
Start: 2015-03-09 — End: 2015-03-09
  Administered 2015-03-09: 70 mg via INTRAVENOUS

## 2015-03-09 MED ORDER — INDOMETHACIN 50 MG RE SUPP
RECTAL | Status: AC
  Filled 2015-03-09: qty 1

## 2015-03-09 MED ORDER — CIPROFLOXACIN IN D5W 400 MG/200ML IV SOLN: 400.0000 mg | Freq: Two times a day (BID) | INTRAVENOUS | Status: DC

## 2015-03-09 MED ORDER — PROPOFOL 10 MG/ML IV BOLUS
INTRAVENOUS | Status: AC
  Filled 2015-03-09: qty 20

## 2015-03-09 MED ORDER — FENTANYL CITRATE (PF) 100 MCG/2ML IJ SOLN
INTRAMUSCULAR | Status: AC
  Filled 2015-03-09: qty 4

## 2015-03-09 MED ORDER — ONDANSETRON HCL 4 MG/2ML IJ SOLN
INTRAMUSCULAR | Status: AC
  Filled 2015-03-09: qty 2

## 2015-03-09 MED ORDER — SODIUM CHLORIDE 0.9 % IV SOLN
INTRAVENOUS | Status: DC | PRN
  Administered 2015-03-09: 40 mL

## 2015-03-09 MED ORDER — LIDOCAINE HCL (CARDIAC) 20 MG/ML IV SOLN
INTRAVENOUS | Status: AC
  Filled 2015-03-09: qty 5

## 2015-03-09 MED ORDER — EPHEDRINE SULFATE 50 MG/ML IJ SOLN
INTRAMUSCULAR | Status: DC | PRN
  Administered 2015-03-09: 5 mg via INTRAVENOUS

## 2015-03-09 MED ORDER — SODIUM CHLORIDE 0.9 % IV SOLN
INTRAVENOUS | Status: DC
  Administered 2015-03-09: 1000 mL via INTRAVENOUS

## 2015-03-09 MED ORDER — PROPOFOL 10 MG/ML IV BOLUS
INTRAVENOUS | Status: DC | PRN
  Administered 2015-03-09: 80 mg via INTRAVENOUS

## 2015-03-09 MED ORDER — CIPROFLOXACIN IN D5W 400 MG/200ML IV SOLN
INTRAVENOUS | Status: DC | PRN
  Administered 2015-03-09: 400 mg via INTRAVENOUS

## 2015-03-09 MED ORDER — CIPROFLOXACIN IN D5W 400 MG/200ML IV SOLN
INTRAVENOUS | Status: AC
  Filled 2015-03-09: qty 200

## 2015-03-09 MED ORDER — FENTANYL CITRATE (PF) 100 MCG/2ML IJ SOLN
INTRAMUSCULAR | Status: DC | PRN
  Administered 2015-03-09: 25 ug via INTRAVENOUS

## 2015-03-09 MED ORDER — SODIUM CHLORIDE 0.9 % IJ SOLN
INTRAMUSCULAR | Status: AC
  Filled 2015-03-09: qty 10

## 2015-03-09 MED ORDER — CLOPIDOGREL BISULFATE 75 MG PO TABS: 75.0000 mg | ORAL_TABLET | Freq: Every day | ORAL | Status: AC

## 2015-03-09 NOTE — Discharge Instructions (Signed)
YOU HAD AN ENDOSCOPIC PROCEDURE TODAY: Refer to the procedure report that was given to you for any specific questions about what was found during the examination.  If the procedure report does not answer your questions, please call your gastroenterologist to clarify.  YOU SHOULD EXPECT: Some feelings of bloating in the abdomen. Passage of more gas than usual.  Walking can help get rid of the air that was put into your GI tract during the procedure and reduce the bloating. If you had a lower endoscopy (such as a colonoscopy or flexible sigmoidoscopy) you may notice spotting of blood in your stool or on the toilet paper.   DIET: Your first meal following the procedure should be a light meal and then it is ok to progress to your normal diet.  A half-sandwich or bowl of soup is an example of a good first meal.  Heavy or fried foods are harder to digest and may make you feel nasueas or bloated.  Drink plenty of fluids but you should avoid alcoholic beverages for 24 hours.  ACTIVITY: Your care partner should take you home directly after the procedure.  You should plan to take it easy, moving slowly for the rest of the day.  You can resume normal activity the day after the procedure however you should NOT DRIVE or use heavy machinery for 24 hours (because of the sedation medicines used during the test).    SYMPTOMS TO REPORT IMMEDIATELY  A gastroenterologist can be reached at any hour.  Please call your doctor's office for any of the following symptoms:   Following lower endoscopy (colonoscopy, flexible sigmoidoscopy)  Excessive amounts of blood in the stool  Significant tenderness, worsening of abdominal pains  Swelling of the abdomen that is new, acute  Fever of 100 or higher  Following upper endoscopy (EGD, EUS, ERCP)  Vomiting of blood or coffee ground material  New, significant abdominal pain  New, significant chest pain or pain under the shoulder blades  Painful or persistently difficult  swallowing  New shortness of breath  Black, tarry-looking stools  FOLLOW UP: If any biopsies were taken you will be contacted by phone or by letter within the next 1-3 weeks.  Call your gastroenterologist if you have not heard about the biopsies in 3 weeks.  Please also call your gastroenterologist's office with any specific questions about appointments or follow up tests.   Endoscopic Retrograde Cholangiopancreatography (ERCP), Care After Refer to this sheet in the next few weeks. These instructions provide you with information on caring for yourself after your procedure. Your health care provider may also give you more specific instructions. Your treatment has been planned according to current medical practices, but problems sometimes occur. Call your health care provider if you have any problems or questions after your procedure.  WHAT TO EXPECT AFTER THE PROCEDURE  After your procedure, it is typical to feel:   Soreness in your throat.   Sick to your stomach (nauseous).   Bloated.  Dizzy.   Fatigued. HOME CARE INSTRUCTIONS  Have a friend or family member stay with you for the first 24 hours after your procedure.  Start taking your usual medicines and eating normally as soon as you feel well enough to do so or as directed by your health care provider. SEEK MEDICAL CARE IF:  You have abdominal pain.   You develop signs of infection, such as:   Chills.   Feeling unwell.  SEEK IMMEDIATE MEDICAL CARE IF:  You have difficulty swallowing.  You have worsening throat, chest, or abdominal pain.  You vomit.  You have bloody or very black stools.  You have a fever.   This information is not intended to replace advice given to you by your health care provider. Make sure you discuss any questions you have with your health care provider.   Document Released: 02/03/2013 Document Reviewed: 02/03/2013 Elsevier Interactive Patient Education [employer].

## 2015-03-09 NOTE — Anesthesia Preprocedure Evaluation (Signed)
Anesthesia Evaluation  Patient identified by MRN, date of birth, ID band Patient awake    Reviewed: Allergy & Precautions, NPO status , Patient's Chart, lab work & pertinent test results  History of Anesthesia Complications Negative for: history of anesthetic complications  Airway Mallampati: II  TM Distance: >3 FB Neck ROM: Full    Dental  (+) Missing, Loose   Pulmonary neg pulmonary ROS,    breath sounds clear to auscultation       Cardiovascular hypertension, Pt. on medications + Peripheral Vascular Disease and +CHF   Rhythm:Regular     Neuro/Psych PSYCHIATRIC DISORDERS  Neuromuscular disease CVA, Residual Symptoms    GI/Hepatic Neg liver ROS, GERD  Medicated and Controlled,  Endo/Other  negative endocrine ROS  Renal/GU Renal InsufficiencyRenal disease     Musculoskeletal  (+) Arthritis ,   Abdominal   Peds  Hematology  (+) anemia ,   Anesthesia Other Findings   Reproductive/Obstetrics                             Anesthesia Physical Anesthesia Plan  ASA: III  Anesthesia Plan: General   Post-op Pain Management:    Induction: Intravenous  Airway Management Planned: Oral ETT  Additional Equipment: None  Intra-op Plan:   Post-operative Plan: Extubation in OR  Informed Consent: I have reviewed the patients History and Physical, chart, labs and discussed the procedure including the risks, benefits and alternatives for the proposed anesthesia with the patient or authorized representative who has indicated his/her understanding and acceptance.   Dental advisory given  Plan Discussed with: CRNA and Surgeon  Anesthesia Plan Comments:         Anesthesia Quick Evaluation

## 2015-03-09 NOTE — Transfer of Care (Signed)
Immediate Anesthesia Transfer of Care Note  Patient: Carrie Grimes  Procedure(s) Performed: Procedure(s): ENDOSCOPIC RETROGRADE CHOLANGIOPANCREATOGRAPHY (ERCP) WITH PROPOFOL (N/A)  Patient Location: PACU  Anesthesia Type:General  Level of Consciousness:  sedated, patient cooperative and responds to stimulation  Airway & Oxygen Therapy:Patient Spontanous Breathing and Patient connected to face mask oxgen  Post-op Assessment:  Report given to PACU RN and Post -op Vital signs reviewed and stable  Post vital signs:  Reviewed and stable  Last Vitals:  Filed Vitals:   03/09/15 0707  BP: 168/39  Pulse: 57  Temp: 36.7 C  Resp: 16    Complications: No apparent anesthesia complications

## 2015-03-09 NOTE — H&P (View-Only) (Signed)
HPI: This is a  very pleasant 81 year old woman   who was referred to me by Tammi Sou, MD  to evaluate  common bile duct stones .    Chief complaint is common bile duct stones  Ct 11/2014 shows small stone in the CBD; done for epig pain;   Normal lfts, Cr 2.2 most recently.  Went to ER due to sharp epigastric pains while eating.  No associated nausea or vomiting.  Was the first time she'd had that pains.  Has been losing weight in past several months (30 pounds or so).  Renal dysfunction.  Has been having loose stools, has been for 6 months or so.    Was started on imodium, takes one pill every morning and this helps.  Slightly demented.  Poor dentition, needs food cut up very well.  She has dysphagia occasionally.  No more pains since then.  Walks with a walker  Frail but pretty good historian  Hard of hearing.  Review of systems: Pertinent positive and negative review of systems were noted in the above HPI section. Complete review of systems was performed and was otherwise normal.   Past Medical History  Diagnosis Date  . Allergic rhinitis, cause unspecified   . Unspecified hearing loss   . Cerebrovascular disease, unspecified     CDopplers 1/13 showed mild mixed plaque w/ 40-59% (low end) bilat ICA stenoses, the prev right CAE & patch angioplasty were patent  . Unspecified venous (peripheral) insufficiency   . Pure hypercholesterolemia   . Unspecified disorder resulting from impaired renal function     hyperkalemia and anemia  . Diaphragmatic hernia without mention of obstruction or gangrene   . Unspecified vitamin D deficiency   . Unspecified cerebral artery occlusion with cerebral infarction     MRI Brain 12/12 showed large remote right hemispheric infarct w/ encephalomalacia w/ prominent small vessel dis superimposed on atrophy... (residual deficits are diminished sensation on left side, left hand grip weakness,   . Dementia with behavioral disturbance      depakote helpful  . Pernicious anemia   . Cataracts, bilateral   . Retinal hemorrhage of right eye   . Chronic renal insufficiency, stage III (moderate)     Borderline stage IV (CrCl around 30 ml/min)  . Essential hypertension   . Choledocholithiasis     12/11/14 CT--nonobstructive (3mm x 6 mm)    Past Surgical History  Procedure Laterality Date  . Cholecystectomy    . Carotid endarterectomy    . Laser surgery on right eye  12/02/2012    Dr. Baird [facility] at [facility]    Current Outpatient Prescriptions  Medication Sig Dispense Refill  . acetaminophen (TYLENOL) 500 MG tablet Take 1,000 mg by mouth at bedtime.     Marland Kitchen amLODipine (NORVASC) 5 MG tablet Take 1 tablet (5 mg total) by mouth daily. 30 tablet 11  . cetirizine (ZYRTEC) 10 MG tablet Take 10 mg by mouth daily.    . clopidogrel (PLAVIX) 75 MG tablet Take 1 tablet (75 mg total) by mouth daily. 30 tablet 3  . divalproex (DEPAKOTE SPRINKLE) 125 MG capsule TAKE 1 CAPSULE TWICE DAILY (Patient taking differently: Take 125 mg by mouth 2 (two) times daily. ) 60 capsule 5  . ENSURE PLUS (ENSURE PLUS) LIQD Take 237 mLs by mouth daily.    . furosemide (LASIX) 20 MG tablet Take 1 tablet (20 mg total) by mouth daily. 30 tablet 6  . Multiple Vitamin (MULTIVITAMIN) capsule Take 1 capsule  by mouth daily.      Marland Kitchen oxybutynin (DITROPAN) 5 MG tablet Take 1 tablet (5 mg total) by mouth 2 (two) times daily. (Patient taking differently: Take 5 mg by mouth daily. Taking once daily (06/09/14)) 60 tablet 11  . ranitidine ([facility] RANITIDINE) 75 MG tablet TAKE ONE TABLET BY MOUTH AT BEDTIME (Patient taking differently: TAKE ONE TABLET BY MOUTH AT DAILY) 30 tablet 3  . sodium chloride (OCEAN) 0.65 % nasal spray Place 1 spray into the nose at bedtime as needed for congestion.     . Urea in Zn Undecyl-Lactic Acid 45 % EMUL Apply to lower extremities once daily 240 mL 6  . vitamin C (ASCORBIC ACID) 500 MG tablet Take 500 mg by mouth daily.      No current  facility-administered medications for this visit.    Allergies as of 02/14/2015 - Review Complete 02/14/2015  Allergen Reaction Noted  . Procaine hcl      Family History  Problem Relation Age of Onset  . Heart disease Mother   . Heart disease Father     Social History   Social History  . Marital Status: Widowed    Spouse Name: N/A  . Number of Children: N/A  . Years of Education: N/A   Occupational History  . Not on file.   Social History Main Topics  . Smoking status: Never Smoker   . Smokeless tobacco: Never Used  . Alcohol Use: No  . Drug Use: No  . Sexual Activity: Not on file   Other Topics Concern  . Not on file   Social History Narrative   Lives in [facility] memory unit.   HC POA is daughter--Brenda Tresa Endo, and/or son Billee Cashing.   Ambulates with rolling walker and manual wheelchair.     Physical Exam: BP 120/60 mmHg  Pulse 72  Ht 5\' 4"  (1.626 m)  Wt 119 lb 12.8 oz (54.341 kg)  BMI 20.55 kg/m2 Constitutional: Frail, elderly woman who is very hard of hearing Psychiatric: alert and oriented x3 Eyes: extraocular movements intact Mouth: oral pharynx moist, no lesions Neck: supple no lymphadenopathy Cardiovascular: heart regular rate and rhythm Lungs: clear to auscultation bilaterally Abdomen: soft, nontender, nondistended, no obvious ascites, no peritoneal signs, normal bowel sounds Extremities: no lower extremity edema bilaterally Skin: no lesions on visible extremities   Assessment and plan: 81 y.o. female with  common bile duct stone probably symptomatic  CAT scan showed a pretty clear common bile duct stone. Her LFTs were normal at that time but the stone may have caused the epigastric pain which caused her to go to the emergency room that day. She has not been bothered by pain since and has not had pains before then. Her gallbladder is out. I had a long discussion with her and her daughter. She is very hard of hearing but I think she  understands the essence of what I was trying to explain. ERCP is normally recommended for any bile duct stones. She is 17 and is frail. She will have to hold her Plavix for 5 days prior. Obviously the procedure would be riskier for her than is usual. I did explain that I think we can get her through this safely out of take all precautions as usual to minimize the chances of complication during the procedures. She her daughter agreed and understood to proceed. We will get Plavix holding clearance from her primary care physician.   Owens Loffler, MD [facility] 02/14/2015, 10:27 AM  Cc: Tammi Sou, MD

## 2015-03-09 NOTE — Interval H&P Note (Signed)
History and Physical Interval Note:  03/09/2015 7:20 AM  Carrie Grimes  has presented today for surgery, with the diagnosis of bile duct stones  The various methods of treatment have been discussed with the patient and family. After consideration of risks, benefits and other options for treatment, the patient has consented to  Procedure(s): ENDOSCOPIC RETROGRADE CHOLANGIOPANCREATOGRAPHY (ERCP) WITH PROPOFOL (N/A) as a surgical intervention .  The patient's history has been reviewed, patient examined, no change in status, stable for surgery.  I have reviewed the patient's chart and labs.  Questions were answered to the patient's satisfaction.     Milus Banister

## 2015-03-09 NOTE — Op Note (Signed)
[facility] [place], [address]   ERCP PROCEDURE REPORT  PATIENT: Carrie Grimes, Carrie Grimes  MR#: [id number] BIRTHDATE: 1921-04-05  GENDER: female ENDOSCOPIST: Milus Banister, MD REFERRED BY: Unk Pinto, M.D. PROCEDURE DATE:  03/09/2015 PROCEDURE:   ERCP with sphincterotomy/papillotomy and ERCP with removal of calculus/calculi INDCATIONS:CBD stone on recent CT scan during ER visit for epigastric pain, normal LFTs, weight loss that has stabilized. MEDICATIONS: Per Anesthesia  cipro 400mg  IV, indomethacin 100mg  PR TOPICAL ANESTHETIC:   none  DESCRIPTION OF PROCEDURE:     Physical exam was performed.  Informed consent was obtained from the patient after explaining the benefits, risks, and alternatives to the procedure.  The patient was connected to the monitor and placed in the semi-prone position. IV medicine was administered through an indwelling cannula and oxygen via endotracheal tube.  After administration of sedation, the patients esophagus was intubated and the Pentax ERCP [id number]  endoscope was advanced under direct visualization to the second portion of the duodenum without detailed examination of the UGI tract. Scout film showed surgical clips in the RUQ. There was a medium to large hiatal hernia that distorted UGI anatomy. There was a small periampullary duodenal diverticulum.  The bile duct was cannulated using a 44 Autotome over a .035 hydrawire and contrast was injected.  Cholagiogram revealed a diffusely dilated CBD, CHD (up to about 31mm) without obvious filling defects or strictures. Given the CT findings of CBD stone I elected to perform a biliary sphincterotomy over the wire. During sphincterotomy oozing started from the superior aspect of the sphincterotomy line and so the sphincterotomy was smaller than usual but still adequate for the procedure.  A retrieval balloon was used to deeply sweep the bile duct several  times. There was delivery of a small amount of stone/biodebris into the duodenum.  The sphincterotomy continued to ooze throughout the procedure despite saline flushes and so I used an inflated 8mm by 4cm biliary dilating balloon over the biliary wire to tamponade for several minutes. This balloon tamponade was successful.     The scope was then completely withdrawn from the patient and the procedure terminated. The main pancreatic duct was briefly cannulate with the wire but never injected with dye.   COMPLICATIONS:    None  ENDOSCOPIC IMPRESSION: Dilated CBD, removal of stone debris following biliary sphincterotomy.  Persistent post-sphincterotomy oozing was treated with balloon tamponade successfully.  RECOMMENDATIONS: OK to restart plavix in 3 days.   _______________________________ eSignedMilus Banister, MD 03/09/2015 8:49 AM

## 2015-03-09 NOTE — Anesthesia Postprocedure Evaluation (Signed)
  Anesthesia Post-op Note  Patient: Carrie Grimes  Procedure(s) Performed: Procedure(s): ENDOSCOPIC RETROGRADE CHOLANGIOPANCREATOGRAPHY (ERCP) WITH PROPOFOL (N/A)  Patient Location: PACU  Anesthesia Type:General  Level of Consciousness: awake  Airway and Oxygen Therapy: Patient Spontanous Breathing  Post-op Pain: none  Post-op Assessment: Post-op Vital signs reviewed, Patient's Cardiovascular Status Stable, Respiratory Function Stable, Patent Airway, No signs of Nausea or vomiting and Pain level controlled              Post-op Vital Signs: Reviewed and stable  Last Vitals:  Filed Vitals:   03/09/15 0940  BP: 165/40  Pulse: 57  Temp:   Resp: 14    Complications: No apparent anesthesia complications

## 2015-03-09 NOTE — Anesthesia Procedure Notes (Signed)
Procedure Name: Intubation Date/Time: 03/09/2015 7:42 AM Performed by: Maxwell Caul Pre-anesthesia Checklist: Patient identified, Emergency Drugs available, Suction available and Patient being monitored Patient Re-evaluated:Patient Re-evaluated prior to inductionOxygen Delivery Method: Circle System Utilized Preoxygenation: Pre-oxygenation with 100% oxygen Intubation Type: IV induction Ventilation: Mask ventilation without difficulty Laryngoscope Size: Mac and 4 Grade View: Grade I Tube type: Oral Number of attempts: 1 Airway Equipment and Method: Stylet Placement Confirmation: ETT inserted through vocal cords under direct vision,  positive ETCO2 and breath sounds checked- equal and bilateral Secured at: 20 cm Tube secured with: Tape Dental Injury: Teeth and Oropharynx as per pre-operative assessment

## 2015-03-10 ENCOUNTER — Telehealth: Payer: Self-pay | Admitting: *Deleted

## 2015-03-10 DIAGNOSIS — E875 Hyperkalemia: Secondary | ICD-10-CM | POA: Diagnosis not present

## 2015-03-10 DIAGNOSIS — Z6820 Body mass index (BMI) 20.0-20.9, adult: Secondary | ICD-10-CM | POA: Diagnosis not present

## 2015-03-10 DIAGNOSIS — I129 Hypertensive chronic kidney disease with stage 1 through stage 4 chronic kidney disease, or unspecified chronic kidney disease: Secondary | ICD-10-CM | POA: Diagnosis not present

## 2015-03-10 DIAGNOSIS — D510 Vitamin B12 deficiency anemia due to intrinsic factor deficiency: Secondary | ICD-10-CM | POA: Diagnosis not present

## 2015-03-10 DIAGNOSIS — F0390 Unspecified dementia without behavioral disturbance: Secondary | ICD-10-CM | POA: Diagnosis not present

## 2015-03-10 DIAGNOSIS — I872 Venous insufficiency (chronic) (peripheral): Secondary | ICD-10-CM | POA: Diagnosis not present

## 2015-03-10 DIAGNOSIS — R634 Abnormal weight loss: Secondary | ICD-10-CM | POA: Diagnosis not present

## 2015-03-10 DIAGNOSIS — N183 Chronic kidney disease, stage 3 (moderate): Secondary | ICD-10-CM | POA: Diagnosis not present

## 2015-03-10 NOTE — Telephone Encounter (Signed)
OK, rx written.

## 2015-03-10 NOTE — Telephone Encounter (Signed)
Helene Kelp with [facility] called stating that pt was complaining of hurting in her left leg and having trouble getting up and down. Pt was requesting tylenol for the pain but the only order they have is for bedtime. Helene Kelp is requesting a new order for tylenol prn pain. She stated that pt had a endoscopy done yesterday to remove a stone and this may be causing some of her pain. Please advise. Thanks. Fax order to [phone]

## 2015-03-10 NOTE — Telephone Encounter (Signed)
Rx faxed

## 2015-03-13 ENCOUNTER — Encounter (HOSPITAL_COMMUNITY): Payer: Self-pay | Admitting: Gastroenterology

## 2015-03-15 DIAGNOSIS — Z6820 Body mass index (BMI) 20.0-20.9, adult: Secondary | ICD-10-CM | POA: Diagnosis not present

## 2015-03-15 DIAGNOSIS — E875 Hyperkalemia: Secondary | ICD-10-CM | POA: Diagnosis not present

## 2015-03-15 DIAGNOSIS — R634 Abnormal weight loss: Secondary | ICD-10-CM | POA: Diagnosis not present

## 2015-03-15 DIAGNOSIS — I872 Venous insufficiency (chronic) (peripheral): Secondary | ICD-10-CM | POA: Diagnosis not present

## 2015-03-15 DIAGNOSIS — I129 Hypertensive chronic kidney disease with stage 1 through stage 4 chronic kidney disease, or unspecified chronic kidney disease: Secondary | ICD-10-CM | POA: Diagnosis not present

## 2015-03-15 DIAGNOSIS — F0390 Unspecified dementia without behavioral disturbance: Secondary | ICD-10-CM | POA: Diagnosis not present

## 2015-03-15 DIAGNOSIS — N183 Chronic kidney disease, stage 3 (moderate): Secondary | ICD-10-CM | POA: Diagnosis not present

## 2015-03-15 DIAGNOSIS — D510 Vitamin B12 deficiency anemia due to intrinsic factor deficiency: Secondary | ICD-10-CM | POA: Diagnosis not present

## 2015-03-16 DIAGNOSIS — R32 Unspecified urinary incontinence: Secondary | ICD-10-CM | POA: Diagnosis not present

## 2015-03-24 DIAGNOSIS — Z6820 Body mass index (BMI) 20.0-20.9, adult: Secondary | ICD-10-CM | POA: Diagnosis not present

## 2015-03-24 DIAGNOSIS — E875 Hyperkalemia: Secondary | ICD-10-CM | POA: Diagnosis not present

## 2015-03-24 DIAGNOSIS — I872 Venous insufficiency (chronic) (peripheral): Secondary | ICD-10-CM | POA: Diagnosis not present

## 2015-03-24 DIAGNOSIS — R634 Abnormal weight loss: Secondary | ICD-10-CM | POA: Diagnosis not present

## 2015-03-24 DIAGNOSIS — I129 Hypertensive chronic kidney disease with stage 1 through stage 4 chronic kidney disease, or unspecified chronic kidney disease: Secondary | ICD-10-CM | POA: Diagnosis not present

## 2015-03-24 DIAGNOSIS — D510 Vitamin B12 deficiency anemia due to intrinsic factor deficiency: Secondary | ICD-10-CM | POA: Diagnosis not present

## 2015-03-24 DIAGNOSIS — F0390 Unspecified dementia without behavioral disturbance: Secondary | ICD-10-CM | POA: Diagnosis not present

## 2015-03-24 DIAGNOSIS — N183 Chronic kidney disease, stage 3 (moderate): Secondary | ICD-10-CM | POA: Diagnosis not present

## 2015-03-27 ENCOUNTER — Encounter: Payer: Self-pay | Admitting: *Deleted

## 2015-03-27 ENCOUNTER — Telehealth: Payer: Self-pay | Admitting: *Deleted

## 2015-03-27 NOTE — Telephone Encounter (Signed)
Order faxed.

## 2015-03-27 NOTE — Telephone Encounter (Signed)
Helene Kelp with [facility] called requesting a new order to give pt B12 injection at [facility]. She stated that the previous nurse thought the injection would not be covered but Helene Kelp said that it pt has a diagnosis of pernicious anemia it would be covered. I have printed out a new order for B12 injection and an order to cancel apt for here tomorrow (03/28/15) for B12 only. Please sign order so it can be faxed to [facility]. Thanks.

## 2015-03-27 NOTE — Telephone Encounter (Signed)
OK, will do. 

## 2015-03-28 ENCOUNTER — Ambulatory Visit: Payer: Medicare Other

## 2015-03-28 DIAGNOSIS — D510 Vitamin B12 deficiency anemia due to intrinsic factor deficiency: Secondary | ICD-10-CM | POA: Diagnosis not present

## 2015-03-28 DIAGNOSIS — F0390 Unspecified dementia without behavioral disturbance: Secondary | ICD-10-CM | POA: Diagnosis not present

## 2015-03-28 DIAGNOSIS — R634 Abnormal weight loss: Secondary | ICD-10-CM | POA: Diagnosis not present

## 2015-03-28 DIAGNOSIS — Z6820 Body mass index (BMI) 20.0-20.9, adult: Secondary | ICD-10-CM | POA: Diagnosis not present

## 2015-03-28 DIAGNOSIS — I129 Hypertensive chronic kidney disease with stage 1 through stage 4 chronic kidney disease, or unspecified chronic kidney disease: Secondary | ICD-10-CM | POA: Diagnosis not present

## 2015-03-28 DIAGNOSIS — I872 Venous insufficiency (chronic) (peripheral): Secondary | ICD-10-CM | POA: Diagnosis not present

## 2015-03-28 DIAGNOSIS — E875 Hyperkalemia: Secondary | ICD-10-CM | POA: Diagnosis not present

## 2015-03-28 DIAGNOSIS — N183 Chronic kidney disease, stage 3 (moderate): Secondary | ICD-10-CM | POA: Diagnosis not present

## 2015-03-30 ENCOUNTER — Encounter: Payer: Self-pay | Admitting: Podiatry

## 2015-03-30 ENCOUNTER — Ambulatory Visit (INDEPENDENT_AMBULATORY_CARE_PROVIDER_SITE_OTHER): Payer: Medicare Other | Admitting: Podiatry

## 2015-03-30 DIAGNOSIS — D510 Vitamin B12 deficiency anemia due to intrinsic factor deficiency: Secondary | ICD-10-CM | POA: Diagnosis not present

## 2015-03-30 DIAGNOSIS — B351 Tinea unguium: Secondary | ICD-10-CM | POA: Diagnosis not present

## 2015-03-30 DIAGNOSIS — M79676 Pain in unspecified toe(s): Secondary | ICD-10-CM

## 2015-03-30 DIAGNOSIS — F0390 Unspecified dementia without behavioral disturbance: Secondary | ICD-10-CM | POA: Diagnosis not present

## 2015-03-30 DIAGNOSIS — I129 Hypertensive chronic kidney disease with stage 1 through stage 4 chronic kidney disease, or unspecified chronic kidney disease: Secondary | ICD-10-CM | POA: Diagnosis not present

## 2015-03-30 DIAGNOSIS — R2689 Other abnormalities of gait and mobility: Secondary | ICD-10-CM | POA: Diagnosis not present

## 2015-03-30 DIAGNOSIS — I872 Venous insufficiency (chronic) (peripheral): Secondary | ICD-10-CM | POA: Diagnosis not present

## 2015-03-30 DIAGNOSIS — N183 Chronic kidney disease, stage 3 (moderate): Secondary | ICD-10-CM | POA: Diagnosis not present

## 2015-03-30 NOTE — Progress Notes (Signed)
Patient ID: Carrie Grimes, female   DOB: [birth year], 81 y.o.   MRN: [id number] Complaint:  Visit Type: Patient returns to my office for continued preventative foot care services. Complaint: Patient states" my nails have grown long and thick and become painful to walk and wear shoe. The patient presents for preventative foot care services. No changes to ROS  Podiatric Exam: Vascular: dorsalis pedis and posterior tibial pulses are not palpable bilateral. Capillary return is immediate. Cold feet noted. Sensorium: Normal Semmes Weinstein monofilament test. Normal tactile sensation bilaterally. Nail Exam: Pt has thick disfigured discolored nails with subungual debris noted bilateral entire nail hallux through fifth toenails Ulcer Exam: There is no evidence of ulcer or pre-ulcerative changes or infection. Orthopedic Exam: Muscle tone and strength are WNL. No limitations in general ROM. No crepitus or effusions noted. Foot type and digits show no abnormalities. Bony prominences are unremarkable. Skin: No Porokeratosis. No infection or ulcers  Diagnosis:  Onychomycosis, , Pain in right toe, pain in left toes  Treatment & Plan Procedures and Treatment: Consent by patient was obtained for treatment procedures. The patient understood the discussion of treatment and procedures well. All questions were answered thoroughly reviewed. Debridement of mycotic and hypertrophic toenails, 1 through 5 bilateral and clearing of subungual debris. No ulceration, no infection noted.  Return Visit-Office Procedure: Patient instructed to return to the office for a follow up visit 3 months for continued evaluation and treatment.  Gardiner Barefoot DPM

## 2015-04-04 DIAGNOSIS — I872 Venous insufficiency (chronic) (peripheral): Secondary | ICD-10-CM | POA: Diagnosis not present

## 2015-04-04 DIAGNOSIS — N183 Chronic kidney disease, stage 3 (moderate): Secondary | ICD-10-CM | POA: Diagnosis not present

## 2015-04-04 DIAGNOSIS — I129 Hypertensive chronic kidney disease with stage 1 through stage 4 chronic kidney disease, or unspecified chronic kidney disease: Secondary | ICD-10-CM | POA: Diagnosis not present

## 2015-04-04 DIAGNOSIS — D510 Vitamin B12 deficiency anemia due to intrinsic factor deficiency: Secondary | ICD-10-CM | POA: Diagnosis not present

## 2015-04-04 DIAGNOSIS — F0390 Unspecified dementia without behavioral disturbance: Secondary | ICD-10-CM | POA: Diagnosis not present

## 2015-04-04 DIAGNOSIS — R2689 Other abnormalities of gait and mobility: Secondary | ICD-10-CM | POA: Diagnosis not present

## 2015-04-06 DIAGNOSIS — D510 Vitamin B12 deficiency anemia due to intrinsic factor deficiency: Secondary | ICD-10-CM | POA: Diagnosis not present

## 2015-04-06 DIAGNOSIS — F0390 Unspecified dementia without behavioral disturbance: Secondary | ICD-10-CM | POA: Diagnosis not present

## 2015-04-06 DIAGNOSIS — R2689 Other abnormalities of gait and mobility: Secondary | ICD-10-CM | POA: Diagnosis not present

## 2015-04-06 DIAGNOSIS — I129 Hypertensive chronic kidney disease with stage 1 through stage 4 chronic kidney disease, or unspecified chronic kidney disease: Secondary | ICD-10-CM | POA: Diagnosis not present

## 2015-04-06 DIAGNOSIS — N183 Chronic kidney disease, stage 3 (moderate): Secondary | ICD-10-CM | POA: Diagnosis not present

## 2015-04-06 DIAGNOSIS — I872 Venous insufficiency (chronic) (peripheral): Secondary | ICD-10-CM | POA: Diagnosis not present

## 2015-04-10 DIAGNOSIS — F0390 Unspecified dementia without behavioral disturbance: Secondary | ICD-10-CM | POA: Diagnosis not present

## 2015-04-10 DIAGNOSIS — R2689 Other abnormalities of gait and mobility: Secondary | ICD-10-CM | POA: Diagnosis not present

## 2015-04-10 DIAGNOSIS — I872 Venous insufficiency (chronic) (peripheral): Secondary | ICD-10-CM | POA: Diagnosis not present

## 2015-04-10 DIAGNOSIS — I129 Hypertensive chronic kidney disease with stage 1 through stage 4 chronic kidney disease, or unspecified chronic kidney disease: Secondary | ICD-10-CM | POA: Diagnosis not present

## 2015-04-10 DIAGNOSIS — N183 Chronic kidney disease, stage 3 (moderate): Secondary | ICD-10-CM | POA: Diagnosis not present

## 2015-04-10 DIAGNOSIS — D510 Vitamin B12 deficiency anemia due to intrinsic factor deficiency: Secondary | ICD-10-CM | POA: Diagnosis not present

## 2015-04-13 DIAGNOSIS — D510 Vitamin B12 deficiency anemia due to intrinsic factor deficiency: Secondary | ICD-10-CM | POA: Diagnosis not present

## 2015-04-13 DIAGNOSIS — I872 Venous insufficiency (chronic) (peripheral): Secondary | ICD-10-CM | POA: Diagnosis not present

## 2015-04-13 DIAGNOSIS — N183 Chronic kidney disease, stage 3 (moderate): Secondary | ICD-10-CM | POA: Diagnosis not present

## 2015-04-13 DIAGNOSIS — F0390 Unspecified dementia without behavioral disturbance: Secondary | ICD-10-CM | POA: Diagnosis not present

## 2015-04-13 DIAGNOSIS — R2689 Other abnormalities of gait and mobility: Secondary | ICD-10-CM | POA: Diagnosis not present

## 2015-04-13 DIAGNOSIS — I129 Hypertensive chronic kidney disease with stage 1 through stage 4 chronic kidney disease, or unspecified chronic kidney disease: Secondary | ICD-10-CM | POA: Diagnosis not present

## 2015-04-17 DIAGNOSIS — I872 Venous insufficiency (chronic) (peripheral): Secondary | ICD-10-CM | POA: Diagnosis not present

## 2015-04-17 DIAGNOSIS — R2689 Other abnormalities of gait and mobility: Secondary | ICD-10-CM | POA: Diagnosis not present

## 2015-04-17 DIAGNOSIS — N183 Chronic kidney disease, stage 3 (moderate): Secondary | ICD-10-CM | POA: Diagnosis not present

## 2015-04-17 DIAGNOSIS — D510 Vitamin B12 deficiency anemia due to intrinsic factor deficiency: Secondary | ICD-10-CM | POA: Diagnosis not present

## 2015-04-17 DIAGNOSIS — I129 Hypertensive chronic kidney disease with stage 1 through stage 4 chronic kidney disease, or unspecified chronic kidney disease: Secondary | ICD-10-CM | POA: Diagnosis not present

## 2015-04-17 DIAGNOSIS — F0390 Unspecified dementia without behavioral disturbance: Secondary | ICD-10-CM | POA: Diagnosis not present

## 2015-04-19 DIAGNOSIS — I872 Venous insufficiency (chronic) (peripheral): Secondary | ICD-10-CM

## 2015-04-19 DIAGNOSIS — D510 Vitamin B12 deficiency anemia due to intrinsic factor deficiency: Secondary | ICD-10-CM

## 2015-04-19 DIAGNOSIS — F0390 Unspecified dementia without behavioral disturbance: Secondary | ICD-10-CM | POA: Diagnosis not present

## 2015-04-19 DIAGNOSIS — R2689 Other abnormalities of gait and mobility: Secondary | ICD-10-CM

## 2015-04-20 DIAGNOSIS — N183 Chronic kidney disease, stage 3 (moderate): Secondary | ICD-10-CM | POA: Diagnosis not present

## 2015-04-20 DIAGNOSIS — I129 Hypertensive chronic kidney disease with stage 1 through stage 4 chronic kidney disease, or unspecified chronic kidney disease: Secondary | ICD-10-CM | POA: Diagnosis not present

## 2015-04-20 DIAGNOSIS — F0390 Unspecified dementia without behavioral disturbance: Secondary | ICD-10-CM | POA: Diagnosis not present

## 2015-04-20 DIAGNOSIS — D510 Vitamin B12 deficiency anemia due to intrinsic factor deficiency: Secondary | ICD-10-CM | POA: Diagnosis not present

## 2015-04-20 DIAGNOSIS — I872 Venous insufficiency (chronic) (peripheral): Secondary | ICD-10-CM | POA: Diagnosis not present

## 2015-04-20 DIAGNOSIS — R2689 Other abnormalities of gait and mobility: Secondary | ICD-10-CM | POA: Diagnosis not present

## 2015-04-26 DIAGNOSIS — I129 Hypertensive chronic kidney disease with stage 1 through stage 4 chronic kidney disease, or unspecified chronic kidney disease: Secondary | ICD-10-CM | POA: Diagnosis not present

## 2015-04-26 DIAGNOSIS — N183 Chronic kidney disease, stage 3 (moderate): Secondary | ICD-10-CM | POA: Diagnosis not present

## 2015-04-26 DIAGNOSIS — D510 Vitamin B12 deficiency anemia due to intrinsic factor deficiency: Secondary | ICD-10-CM | POA: Diagnosis not present

## 2015-04-26 DIAGNOSIS — I872 Venous insufficiency (chronic) (peripheral): Secondary | ICD-10-CM | POA: Diagnosis not present

## 2015-04-26 DIAGNOSIS — R2689 Other abnormalities of gait and mobility: Secondary | ICD-10-CM | POA: Diagnosis not present

## 2015-04-26 DIAGNOSIS — F0390 Unspecified dementia without behavioral disturbance: Secondary | ICD-10-CM | POA: Diagnosis not present

## 2015-04-27 DIAGNOSIS — F0390 Unspecified dementia without behavioral disturbance: Secondary | ICD-10-CM | POA: Diagnosis not present

## 2015-04-27 DIAGNOSIS — R2689 Other abnormalities of gait and mobility: Secondary | ICD-10-CM | POA: Diagnosis not present

## 2015-04-27 DIAGNOSIS — D510 Vitamin B12 deficiency anemia due to intrinsic factor deficiency: Secondary | ICD-10-CM | POA: Diagnosis not present

## 2015-04-27 DIAGNOSIS — N183 Chronic kidney disease, stage 3 (moderate): Secondary | ICD-10-CM | POA: Diagnosis not present

## 2015-04-27 DIAGNOSIS — I129 Hypertensive chronic kidney disease with stage 1 through stage 4 chronic kidney disease, or unspecified chronic kidney disease: Secondary | ICD-10-CM | POA: Diagnosis not present

## 2015-04-27 DIAGNOSIS — I872 Venous insufficiency (chronic) (peripheral): Secondary | ICD-10-CM | POA: Diagnosis not present

## 2015-05-04 DIAGNOSIS — I872 Venous insufficiency (chronic) (peripheral): Secondary | ICD-10-CM | POA: Diagnosis not present

## 2015-05-04 DIAGNOSIS — I129 Hypertensive chronic kidney disease with stage 1 through stage 4 chronic kidney disease, or unspecified chronic kidney disease: Secondary | ICD-10-CM | POA: Diagnosis not present

## 2015-05-04 DIAGNOSIS — R2689 Other abnormalities of gait and mobility: Secondary | ICD-10-CM | POA: Diagnosis not present

## 2015-05-04 DIAGNOSIS — D510 Vitamin B12 deficiency anemia due to intrinsic factor deficiency: Secondary | ICD-10-CM | POA: Diagnosis not present

## 2015-05-04 DIAGNOSIS — N183 Chronic kidney disease, stage 3 (moderate): Secondary | ICD-10-CM | POA: Diagnosis not present

## 2015-05-04 DIAGNOSIS — F0390 Unspecified dementia without behavioral disturbance: Secondary | ICD-10-CM | POA: Diagnosis not present

## 2015-05-11 ENCOUNTER — Ambulatory Visit: Payer: Medicare Other | Admitting: Family Medicine

## 2015-05-11 DIAGNOSIS — D510 Vitamin B12 deficiency anemia due to intrinsic factor deficiency: Secondary | ICD-10-CM | POA: Diagnosis not present

## 2015-05-11 DIAGNOSIS — F0390 Unspecified dementia without behavioral disturbance: Secondary | ICD-10-CM | POA: Diagnosis not present

## 2015-05-11 DIAGNOSIS — I129 Hypertensive chronic kidney disease with stage 1 through stage 4 chronic kidney disease, or unspecified chronic kidney disease: Secondary | ICD-10-CM | POA: Diagnosis not present

## 2015-05-11 DIAGNOSIS — N183 Chronic kidney disease, stage 3 (moderate): Secondary | ICD-10-CM | POA: Diagnosis not present

## 2015-05-11 DIAGNOSIS — R2689 Other abnormalities of gait and mobility: Secondary | ICD-10-CM | POA: Diagnosis not present

## 2015-05-11 DIAGNOSIS — I872 Venous insufficiency (chronic) (peripheral): Secondary | ICD-10-CM | POA: Diagnosis not present

## 2015-05-18 DIAGNOSIS — D510 Vitamin B12 deficiency anemia due to intrinsic factor deficiency: Secondary | ICD-10-CM | POA: Diagnosis not present

## 2015-05-18 DIAGNOSIS — F0390 Unspecified dementia without behavioral disturbance: Secondary | ICD-10-CM | POA: Diagnosis not present

## 2015-05-18 DIAGNOSIS — I872 Venous insufficiency (chronic) (peripheral): Secondary | ICD-10-CM | POA: Diagnosis not present

## 2015-05-18 DIAGNOSIS — R2689 Other abnormalities of gait and mobility: Secondary | ICD-10-CM | POA: Diagnosis not present

## 2015-05-18 DIAGNOSIS — N183 Chronic kidney disease, stage 3 (moderate): Secondary | ICD-10-CM | POA: Diagnosis not present

## 2015-05-18 DIAGNOSIS — I129 Hypertensive chronic kidney disease with stage 1 through stage 4 chronic kidney disease, or unspecified chronic kidney disease: Secondary | ICD-10-CM | POA: Diagnosis not present

## 2015-05-25 DIAGNOSIS — I872 Venous insufficiency (chronic) (peripheral): Secondary | ICD-10-CM | POA: Diagnosis not present

## 2015-05-25 DIAGNOSIS — D510 Vitamin B12 deficiency anemia due to intrinsic factor deficiency: Secondary | ICD-10-CM | POA: Diagnosis not present

## 2015-05-25 DIAGNOSIS — N183 Chronic kidney disease, stage 3 (moderate): Secondary | ICD-10-CM | POA: Diagnosis not present

## 2015-05-25 DIAGNOSIS — F0390 Unspecified dementia without behavioral disturbance: Secondary | ICD-10-CM | POA: Diagnosis not present

## 2015-05-25 DIAGNOSIS — R2689 Other abnormalities of gait and mobility: Secondary | ICD-10-CM | POA: Diagnosis not present

## 2015-05-25 DIAGNOSIS — I129 Hypertensive chronic kidney disease with stage 1 through stage 4 chronic kidney disease, or unspecified chronic kidney disease: Secondary | ICD-10-CM | POA: Diagnosis not present

## 2015-06-02 DIAGNOSIS — I872 Venous insufficiency (chronic) (peripheral): Secondary | ICD-10-CM | POA: Diagnosis not present

## 2015-06-02 DIAGNOSIS — D510 Vitamin B12 deficiency anemia due to intrinsic factor deficiency: Secondary | ICD-10-CM | POA: Diagnosis not present

## 2015-06-02 DIAGNOSIS — I129 Hypertensive chronic kidney disease with stage 1 through stage 4 chronic kidney disease, or unspecified chronic kidney disease: Secondary | ICD-10-CM | POA: Diagnosis not present

## 2015-06-02 DIAGNOSIS — F0390 Unspecified dementia without behavioral disturbance: Secondary | ICD-10-CM | POA: Diagnosis not present

## 2015-06-08 ENCOUNTER — Ambulatory Visit (INDEPENDENT_AMBULATORY_CARE_PROVIDER_SITE_OTHER): Payer: Medicare Other | Admitting: Family Medicine

## 2015-06-08 ENCOUNTER — Encounter: Payer: Self-pay | Admitting: Family Medicine

## 2015-06-08 VITALS — BP 150/64 | HR 51 | Temp 98.1°F | Resp 16 | Ht 64.0 in | Wt 116.8 lb

## 2015-06-08 DIAGNOSIS — E875 Hyperkalemia: Secondary | ICD-10-CM | POA: Diagnosis not present

## 2015-06-08 DIAGNOSIS — N183 Chronic kidney disease, stage 3 unspecified: Secondary | ICD-10-CM

## 2015-06-08 DIAGNOSIS — Z79899 Other long term (current) drug therapy: Secondary | ICD-10-CM

## 2015-06-08 DIAGNOSIS — F03918 Unspecified dementia, unspecified severity, with other behavioral disturbance: Secondary | ICD-10-CM

## 2015-06-08 DIAGNOSIS — I872 Venous insufficiency (chronic) (peripheral): Secondary | ICD-10-CM

## 2015-06-08 DIAGNOSIS — F0391 Unspecified dementia with behavioral disturbance: Secondary | ICD-10-CM

## 2015-06-08 DIAGNOSIS — I10 Essential (primary) hypertension: Secondary | ICD-10-CM | POA: Diagnosis not present

## 2015-06-08 DIAGNOSIS — D649 Anemia, unspecified: Secondary | ICD-10-CM | POA: Diagnosis not present

## 2015-06-08 DIAGNOSIS — Z23 Encounter for immunization: Secondary | ICD-10-CM | POA: Diagnosis not present

## 2015-06-08 DIAGNOSIS — Z8673 Personal history of transient ischemic attack (TIA), and cerebral infarction without residual deficits: Secondary | ICD-10-CM

## 2015-06-08 LAB — CBC WITH DIFFERENTIAL/PLATELET
BASOS ABS: 0.1 10*3/uL (ref 0.0–0.1)
Basophils Relative: 1 % (ref 0–1)
EOS ABS: 0.2 10*3/uL (ref 0.0–0.7)
Eosinophils Relative: 3 % (ref 0–5)
HCT: 27.5 % — ABNORMAL LOW (ref 36.0–46.0)
Hemoglobin: 9 g/dL — ABNORMAL LOW (ref 12.0–15.0)
LYMPHS ABS: 2.4 10*3/uL (ref 0.7–4.0)
LYMPHS PCT: 39 % (ref 12–46)
MCH: 31.3 pg (ref 26.0–34.0)
MCHC: 32.7 g/dL (ref 30.0–36.0)
MCV: 95.5 fL (ref 78.0–100.0)
MPV: 9.7 fL (ref 8.6–12.4)
Monocytes Absolute: 0.5 10*3/uL (ref 0.1–1.0)
Monocytes Relative: 9 % (ref 3–12)
NEUTROS PCT: 48 % (ref 43–77)
Neutro Abs: 2.9 10*3/uL (ref 1.7–7.7)
PLATELETS: 166 10*3/uL (ref 150–400)
RBC: 2.88 MIL/uL — ABNORMAL LOW (ref 3.87–5.11)
RDW: 13.4 % (ref 11.5–15.5)
WBC: 6.1 10*3/uL (ref 4.0–10.5)

## 2015-06-08 LAB — BASIC METABOLIC PANEL
BUN: 55 mg/dL — ABNORMAL HIGH (ref 7–25)
CHLORIDE: 111 mmol/L — AB (ref 98–110)
CO2: 19 mmol/L — AB (ref 20–31)
CREATININE: 2.16 mg/dL — AB (ref 0.60–0.88)
Calcium: 9.3 mg/dL (ref 8.6–10.4)
Glucose, Bld: 94 mg/dL (ref 65–99)
Potassium: 6.8 mmol/L (ref 3.5–5.3)
Sodium: 143 mmol/L (ref 135–146)

## 2015-06-08 NOTE — Progress Notes (Signed)
Pre visit review using our clinic review tool, if applicable. No additional management support is needed unless otherwise documented below in the visit note. 

## 2015-06-08 NOTE — Progress Notes (Signed)
OFFICE VISIT  06/08/2015   CC:  Chief Complaint  Patient presents with  . Follow-up    Pt is not fasting.      HPI:    Patient is a 82 y.o. Caucasian female who presents with an attendant from her NH for 4 mo f/u LE venous insufficiency, dementia with behavioral disturbance, CRI stage III, hx of CVA, and hx of normocytic anemia (suspect anemia of CRI + pernicious anemia). Review of her bp checks show mostly normal readings, no lows.   No problems with diarrhea.  She is eating/drinking well.   She is pleasant, has baseline short term memory problems and occasional anger outbursts in the past but none as of late. Seems to have responded to depakote use.  ROS: no fevers, no nausea/abd pain, no cough, no SOB, no CP. +Urinary frequency ever since she has been on diuretics.   Past Medical History  Diagnosis Date  . Allergic rhinitis, cause unspecified   . Unspecified hearing loss   . Cerebrovascular disease, unspecified     CDopplers 1/13 showed mild mixed plaque w/ 40-59% (low end) bilat ICA stenoses, the prev right CAE & patch angioplasty were patent  . Unspecified venous (peripheral) insufficiency   . Pure hypercholesterolemia   . Unspecified disorder resulting from impaired renal function     hyperkalemia and anemia  . Diaphragmatic hernia without mention of obstruction or gangrene   . Unspecified vitamin D deficiency   . Dementia with behavioral disturbance     depakote helpful  . Pernicious anemia   . Cataracts, bilateral   . Retinal hemorrhage of right eye   . Chronic renal insufficiency, stage III (moderate)     Borderline stage IV (CrCl around 30 ml/min)  . Essential hypertension   . Choledocholithiasis     12/11/14 CT--nonobstructive (37mm x 6 mm)  . Unspecified cerebral artery occlusion with cerebral infarction     MRI Brain 12/12 showed large remote right hemispheric infarct w/ encephalomalacia w/ prominent small vessel dis superimposed on atrophy... (residual  deficits are diminished sensation on left side, left hand grip weakness,   . Impaired mobility     uses walker, wheelchair    Past Surgical History  Procedure Laterality Date  . Cholecystectomy    . Carotid endarterectomy    . Laser surgery on right eye  12/02/2012    [facility]  . Endoscopic retrograde cholangiopancreatography (ercp) with propofol N/A 03/09/2015    Procedure: ENDOSCOPIC RETROGRADE CHOLANGIOPANCREATOGRAPHY (ERCP) WITH PROPOFOL;  Surgeon: Milus Banister, MD;  Location: [facility];  Service: Endoscopy;  Laterality: N/A;    Outpatient Prescriptions Prior to Visit  Medication Sig Dispense Refill  . acetaminophen (TYLENOL) 500 MG tablet Take 1,000 mg by mouth at bedtime.     Marland Kitchen amLODipine (NORVASC) 5 MG tablet Take 1 tablet (5 mg total) by mouth daily. 30 tablet 11  . cetirizine (ZYRTEC) 10 MG tablet Take 10 mg by mouth daily.    . clopidogrel (PLAVIX) 75 MG tablet Take 1 tablet (75 mg total) by mouth daily. RESTART THIS IN 3 DAYS 30 tablet 3  . cyanocobalamin (,VITAMIN B-12,) 1000 MCG/ML injection Inject 1,000 mcg into the skin every 30 (thirty) days. On the 12th of every month.    . divalproex (DEPAKOTE SPRINKLE) 125 MG capsule TAKE 1 CAPSULE TWICE DAILY (Patient taking differently: Take 125 mg by mouth 2 (two) times daily. ) 60 capsule 5  . ENSURE PLUS (ENSURE PLUS) LIQD Take 237  mLs by mouth daily.    . furosemide (LASIX) 20 MG tablet Take 1 tablet (20 mg total) by mouth daily. 30 tablet 6  . loperamide (IMODIUM A-D) 2 MG tablet Take 4 mg by mouth daily.    Marland Kitchen loratadine (CLARITIN) 10 MG tablet Take 10 mg by mouth at bedtime as needed for allergies.    . Multiple Vitamin (MULTIVITAMIN) capsule Take 1 capsule by mouth daily.      Marland Kitchen oxybutynin (DITROPAN) 5 MG tablet Take 5 mg by mouth daily.    . ranitidine (ZANTAC) 75 MG tablet Take 75 mg by mouth daily.    . sodium chloride (OCEAN) 0.65 % nasal spray Place 2 sprays into the nose daily as needed for  congestion. 1 spray in each nostril    . urea (CARMOL) 20 % cream Apply topically as needed.    . Urea in Zn Undecyl-Lactic Acid 45 % EMUL Apply to lower extremities once daily (Patient taking differently: Apply 1 application topically at bedtime. Apply to lower extremities once daily) 240 mL 6  . vitamin C (ASCORBIC ACID) 500 MG tablet Take 500 mg by mouth daily.     . Multiple Vitamins-Iron (MULTIVITAMINS WITH IRON) TABS tablet Take 1 tablet by mouth daily. Reported on 06/08/2015     No facility-administered medications prior to visit.    Allergies  Allergen Reactions  . Procaine Hcl     REACTION: pt states reaction to shot at dentist office w/ tachycardia \\T \ SOB (? if really from combination w/epi)    ROS As per HPI  PE: Blood pressure 150/64, pulse 51, temperature 98.1 F (36.7 C), temperature source Oral, resp. rate 16, height 5\' 4"  (1.626 m), weight 116 lb 12 oz (52.957 kg), SpO2 97 %. Gen: Alert, well appearing.  Patient is oriented to person, place, time, and situation.  She ambulates with a rolling walker. Pleasant, very hard of hearing.  Says "I love you" to everyone in the office. Attends well. RRR, 2/6 syst murmur without rub or gallop LUNGS: CTA bilat LL's: no clubbing or cyanosis.  She has 3+ pitting edema in both LL's, L calf larger than R chronically.  No erythema/rash. Neuro: CN 2-12 intact bilaterally, strength 5/5 in proximal and distal upper extremities and lower extremities bilaterally.  No sensory deficits.  No tremor.    LABS:  Lab Results  Component Value Date   IRON 54 09/08/2014   FERRITIN 200.2 09/08/2014    Lab Results  Component Value Date   TSH 1.22 09/29/2014   Lab Results  Component Value Date   WBC 6.9 01/19/2015   HGB 9.5* 01/19/2015   HCT 29.3* 01/19/2015   MCV 94.8 01/19/2015   PLT 229.0 01/19/2015   Lab Results  Component Value Date   CREATININE 2.14* 01/23/2015   BUN 55* 01/23/2015   NA 141 01/23/2015   K 5.5* 01/23/2015   CL  102 01/23/2015   CO2 29 01/23/2015   Lab Results  Component Value Date   ALT 6 01/19/2015   AST 12 01/19/2015   ALKPHOS 89 01/19/2015   BILITOT 0.3 01/19/2015   Lab Results  Component Value Date   CHOL 133 12/02/2012   Lab Results  Component Value Date   HDL 42.60 12/02/2012   Lab Results  Component Value Date   LDLCALC 64 12/02/2012   Lab Results  Component Value Date   TRIG 132.0 12/02/2012   Lab Results  Component Value Date   CHOLHDL 3 12/02/2012  IMPRESSION AND PLAN:  1) Dementia with behavioral disturbance: The current medical regimen is effective;  continue present plan and medications. High risk med use: monitor CBC and BMET today.  AST/ALT normal 12/2014.  2) HTN; The current medical regimen is effective;  continue present plan and medications. Lytes/cr today.  3) CRI stage III, with hx of borderline hyperkalemia: check lytes/cr today.  4) Normocytic anemia: renal dz + pernicious anemia.  Continue vit B12 injections q month.  Recheck CBC today.  5) Prev health care: prevnar 13 given today.  6) Hx of CVA: no significant residual deficit.  She just finished a round of PT. Continue BP control and plavix.    7) LE venous insufficiency edema: chronic/stable.  No skin breakdown or sign of dermatitis or cellulitis.  An After Visit Summary was printed and given to the patient.  FOLLOW UP: Return in about 4 months (around 10/06/2015) for routine chronic illness f/u.

## 2015-06-12 ENCOUNTER — Telehealth: Payer: Self-pay | Admitting: Family Medicine

## 2015-06-12 ENCOUNTER — Other Ambulatory Visit (HOSPITAL_COMMUNITY)
Admission: AD | Admit: 2015-06-12 | Discharge: 2015-06-12 | Disposition: A | Discharge status: Home / Self Care | Payer: Medicare Other | Source: Skilled Nursing Facility | Attending: Family Medicine | Admitting: Family Medicine

## 2015-06-12 ENCOUNTER — Encounter: Payer: Self-pay | Admitting: Family Medicine

## 2015-06-12 DIAGNOSIS — Z79899 Other long term (current) drug therapy: Secondary | ICD-10-CM | POA: Diagnosis not present

## 2015-06-12 DIAGNOSIS — N183 Chronic kidney disease, stage 3 unspecified: Secondary | ICD-10-CM | POA: Insufficient documentation

## 2015-06-12 DIAGNOSIS — I129 Hypertensive chronic kidney disease with stage 1 through stage 4 chronic kidney disease, or unspecified chronic kidney disease: Secondary | ICD-10-CM | POA: Diagnosis not present

## 2015-06-12 DIAGNOSIS — D510 Vitamin B12 deficiency anemia due to intrinsic factor deficiency: Secondary | ICD-10-CM | POA: Diagnosis not present

## 2015-06-12 DIAGNOSIS — I10 Essential (primary) hypertension: Secondary | ICD-10-CM | POA: Insufficient documentation

## 2015-06-12 DIAGNOSIS — Z7902 Long term (current) use of antithrombotics/antiplatelets: Secondary | ICD-10-CM | POA: Diagnosis not present

## 2015-06-12 DIAGNOSIS — E875 Hyperkalemia: Secondary | ICD-10-CM

## 2015-06-12 DIAGNOSIS — I872 Venous insufficiency (chronic) (peripheral): Secondary | ICD-10-CM | POA: Diagnosis not present

## 2015-06-12 DIAGNOSIS — Z8673 Personal history of transient ischemic attack (TIA), and cerebral infarction without residual deficits: Secondary | ICD-10-CM | POA: Diagnosis not present

## 2015-06-12 HISTORY — DX: Hyperkalemia: E87.5

## 2015-06-12 LAB — BASIC METABOLIC PANEL
Anion gap: 7 (ref 5–15)
BUN: 57 mg/dL — AB (ref 6–20)
CHLORIDE: 112 mmol/L — AB (ref 101–111)
CO2: 25 mmol/L (ref 22–32)
Calcium: 9.1 mg/dL (ref 8.9–10.3)
Creatinine, Ser: 2.07 mg/dL — ABNORMAL HIGH (ref 0.44–1.00)
GFR calc Af Amer: 23 mL/min — ABNORMAL LOW (ref >60)
GFR calc non Af Amer: 20 mL/min — ABNORMAL LOW (ref >60)
GLUCOSE: 144 mg/dL — AB (ref 65–99)
POTASSIUM: 5.1 mmol/L (ref 3.5–5.1)
Sodium: 144 mmol/L (ref 135–145)

## 2015-06-12 NOTE — Telephone Encounter (Signed)
Need to get [facility] nursing to draw another BMET in 2 wks so I can document stability of her potassium and Cr.  I'll write order on rx pad if needed.  Let me know-thx

## 2015-06-12 NOTE — Telephone Encounter (Signed)
Please write order and I will fax it to [facility] nursing. Thanks.

## 2015-06-12 NOTE — Telephone Encounter (Signed)
Order written

## 2015-06-13 NOTE — Telephone Encounter (Signed)
Order faxed to [facility].

## 2015-06-15 DIAGNOSIS — R32 Unspecified urinary incontinence: Secondary | ICD-10-CM | POA: Diagnosis not present

## 2015-06-20 DIAGNOSIS — I129 Hypertensive chronic kidney disease with stage 1 through stage 4 chronic kidney disease, or unspecified chronic kidney disease: Secondary | ICD-10-CM | POA: Diagnosis not present

## 2015-06-20 DIAGNOSIS — N184 Chronic kidney disease, stage 4 (severe): Secondary | ICD-10-CM | POA: Diagnosis not present

## 2015-06-20 DIAGNOSIS — N2581 Secondary hyperparathyroidism of renal origin: Secondary | ICD-10-CM | POA: Diagnosis not present

## 2015-06-20 DIAGNOSIS — E875 Hyperkalemia: Secondary | ICD-10-CM | POA: Diagnosis not present

## 2015-06-20 DIAGNOSIS — D509 Iron deficiency anemia, unspecified: Secondary | ICD-10-CM | POA: Diagnosis not present

## 2015-06-27 DIAGNOSIS — I129 Hypertensive chronic kidney disease with stage 1 through stage 4 chronic kidney disease, or unspecified chronic kidney disease: Secondary | ICD-10-CM | POA: Diagnosis not present

## 2015-06-27 DIAGNOSIS — I872 Venous insufficiency (chronic) (peripheral): Secondary | ICD-10-CM | POA: Diagnosis not present

## 2015-06-27 DIAGNOSIS — Z7902 Long term (current) use of antithrombotics/antiplatelets: Secondary | ICD-10-CM | POA: Diagnosis not present

## 2015-06-27 DIAGNOSIS — Z79899 Other long term (current) drug therapy: Secondary | ICD-10-CM | POA: Diagnosis not present

## 2015-06-27 DIAGNOSIS — N183 Chronic kidney disease, stage 3 (moderate): Secondary | ICD-10-CM | POA: Diagnosis not present

## 2015-06-27 DIAGNOSIS — D510 Vitamin B12 deficiency anemia due to intrinsic factor deficiency: Secondary | ICD-10-CM | POA: Diagnosis not present

## 2015-06-27 DIAGNOSIS — Z8673 Personal history of transient ischemic attack (TIA), and cerebral infarction without residual deficits: Secondary | ICD-10-CM | POA: Diagnosis not present

## 2015-06-29 ENCOUNTER — Ambulatory Visit: Payer: Medicare Other | Admitting: Podiatry

## 2015-07-04 ENCOUNTER — Other Ambulatory Visit (HOSPITAL_COMMUNITY): Payer: Self-pay | Admitting: *Deleted

## 2015-07-05 ENCOUNTER — Ambulatory Visit (HOSPITAL_COMMUNITY)
Admission: RE | Admit: 2015-07-05 | Discharge: 2015-07-05 | Disposition: A | Discharge status: Home / Self Care | Payer: Medicare Other | Source: Ambulatory Visit | Attending: Nephrology | Admitting: Nephrology

## 2015-07-05 DIAGNOSIS — D631 Anemia in chronic kidney disease: Secondary | ICD-10-CM | POA: Insufficient documentation

## 2015-07-05 DIAGNOSIS — D509 Iron deficiency anemia, unspecified: Secondary | ICD-10-CM | POA: Diagnosis not present

## 2015-07-05 DIAGNOSIS — Z79899 Other long term (current) drug therapy: Secondary | ICD-10-CM | POA: Diagnosis not present

## 2015-07-05 DIAGNOSIS — N184 Chronic kidney disease, stage 4 (severe): Secondary | ICD-10-CM | POA: Diagnosis not present

## 2015-07-05 DIAGNOSIS — Z5181 Encounter for therapeutic drug level monitoring: Secondary | ICD-10-CM | POA: Diagnosis not present

## 2015-07-05 LAB — CBC
HEMATOCRIT: 27.7 % — AB (ref 36.0–46.0)
HEMOGLOBIN: 9.3 g/dL — AB (ref 12.0–15.0)
MCH: 32.3 pg (ref 26.0–34.0)
MCHC: 33.6 g/dL (ref 30.0–36.0)
MCV: 96.2 fL (ref 78.0–100.0)
Platelets: 141 10*3/uL — ABNORMAL LOW (ref 150–400)
RBC: 2.88 MIL/uL — ABNORMAL LOW (ref 3.87–5.11)
RDW: 13.2 % (ref 11.5–15.5)
WBC: 5.5 10*3/uL (ref 4.0–10.5)

## 2015-07-05 MED ORDER — SODIUM CHLORIDE 0.9 % IV SOLN
510.0000 mg | Freq: Once | INTRAVENOUS | Status: AC
  Administered 2015-07-05: 510 mg via INTRAVENOUS
  Filled 2015-07-05: qty 17

## 2015-07-05 MED ORDER — DARBEPOETIN ALFA 40 MCG/0.4ML IJ SOSY
40.0000 ug | PREFILLED_SYRINGE | INTRAMUSCULAR | Status: DC
  Administered 2015-07-05: 40 ug via SUBCUTANEOUS

## 2015-07-05 MED ORDER — DARBEPOETIN ALFA 40 MCG/0.4ML IJ SOSY
PREFILLED_SYRINGE | INTRAMUSCULAR | Status: AC
  Filled 2015-07-05: qty 0.4

## 2015-07-05 NOTE — Discharge Instructions (Signed)

## 2015-07-13 DIAGNOSIS — I129 Hypertensive chronic kidney disease with stage 1 through stage 4 chronic kidney disease, or unspecified chronic kidney disease: Secondary | ICD-10-CM | POA: Diagnosis not present

## 2015-07-13 DIAGNOSIS — R32 Unspecified urinary incontinence: Secondary | ICD-10-CM | POA: Diagnosis not present

## 2015-07-20 ENCOUNTER — Encounter: Payer: Self-pay | Admitting: Podiatry

## 2015-07-20 ENCOUNTER — Ambulatory Visit (INDEPENDENT_AMBULATORY_CARE_PROVIDER_SITE_OTHER): Payer: Medicare Other | Admitting: Podiatry

## 2015-07-20 DIAGNOSIS — B351 Tinea unguium: Secondary | ICD-10-CM

## 2015-07-20 DIAGNOSIS — M79676 Pain in unspecified toe(s): Secondary | ICD-10-CM

## 2015-07-20 NOTE — Progress Notes (Signed)
Patient ID: Carrie Grimes, female   DOB: 11/08/1920, 82 y.o.   MRN: [id number] Complaint:  Visit Type: Patient returns to my office for continued preventative foot care services. Complaint: Patient states" my nails have grown long and thick and become painful to walk and wear shoe. The patient presents for preventative foot care services. No changes to ROS  Podiatric Exam: Vascular: dorsalis pedis and posterior tibial pulses are not palpable bilateral. Capillary return is immediate. Cold feet noted. Sensorium: Normal Semmes Weinstein monofilament test. Normal tactile sensation bilaterally. Nail Exam: Pt has thick disfigured discolored nails with subungual debris noted bilateral entire nail hallux through fifth toenails Ulcer Exam: There is no evidence of ulcer or pre-ulcerative changes or infection. Orthopedic Exam: Muscle tone and strength are WNL. No limitations in general ROM. No crepitus or effusions noted. Foot type and digits show no abnormalities. Bony prominences are unremarkable. Skin: No Porokeratosis. No infection or ulcers  Diagnosis:  Onychomycosis, , Pain in right toe, pain in left toes  Treatment & Plan Procedures and Treatment: Consent by patient was obtained for treatment procedures. The patient understood the discussion of treatment and procedures well. All questions were answered thoroughly reviewed. Debridement of mycotic and hypertrophic toenails, 1 through 5 bilateral and clearing of subungual debris. No ulceration, no infection noted.  Return Visit-Office Procedure: Patient instructed to return to the office for a follow up visit 3 months for continued evaluation and treatment.  Christiaan Strebeck DPM 

## 2015-07-25 DIAGNOSIS — I872 Venous insufficiency (chronic) (peripheral): Secondary | ICD-10-CM | POA: Diagnosis not present

## 2015-07-25 DIAGNOSIS — Z7902 Long term (current) use of antithrombotics/antiplatelets: Secondary | ICD-10-CM | POA: Diagnosis not present

## 2015-07-25 DIAGNOSIS — I129 Hypertensive chronic kidney disease with stage 1 through stage 4 chronic kidney disease, or unspecified chronic kidney disease: Secondary | ICD-10-CM | POA: Diagnosis not present

## 2015-07-25 DIAGNOSIS — N183 Chronic kidney disease, stage 3 (moderate): Secondary | ICD-10-CM | POA: Diagnosis not present

## 2015-07-25 DIAGNOSIS — D510 Vitamin B12 deficiency anemia due to intrinsic factor deficiency: Secondary | ICD-10-CM | POA: Diagnosis not present

## 2015-07-25 DIAGNOSIS — Z79899 Other long term (current) drug therapy: Secondary | ICD-10-CM | POA: Diagnosis not present

## 2015-07-25 DIAGNOSIS — Z8673 Personal history of transient ischemic attack (TIA), and cerebral infarction without residual deficits: Secondary | ICD-10-CM | POA: Diagnosis not present

## 2015-07-27 DIAGNOSIS — N184 Chronic kidney disease, stage 4 (severe): Secondary | ICD-10-CM | POA: Diagnosis not present

## 2015-07-27 DIAGNOSIS — I129 Hypertensive chronic kidney disease with stage 1 through stage 4 chronic kidney disease, or unspecified chronic kidney disease: Secondary | ICD-10-CM | POA: Diagnosis not present

## 2015-07-28 DIAGNOSIS — Z79899 Other long term (current) drug therapy: Secondary | ICD-10-CM | POA: Diagnosis not present

## 2015-07-28 DIAGNOSIS — F0390 Unspecified dementia without behavioral disturbance: Secondary | ICD-10-CM | POA: Diagnosis not present

## 2015-07-28 DIAGNOSIS — D510 Vitamin B12 deficiency anemia due to intrinsic factor deficiency: Secondary | ICD-10-CM | POA: Diagnosis not present

## 2015-07-28 DIAGNOSIS — I872 Venous insufficiency (chronic) (peripheral): Secondary | ICD-10-CM | POA: Diagnosis not present

## 2015-08-01 ENCOUNTER — Encounter (HOSPITAL_COMMUNITY)
Admission: RE | Admit: 2015-08-01 | Discharge: 2015-08-01 | Disposition: A | Discharge status: Home / Self Care | Payer: Medicare Other | Source: Ambulatory Visit | Attending: Nephrology | Admitting: Nephrology

## 2015-08-01 DIAGNOSIS — Z79899 Other long term (current) drug therapy: Secondary | ICD-10-CM | POA: Diagnosis not present

## 2015-08-01 DIAGNOSIS — N184 Chronic kidney disease, stage 4 (severe): Secondary | ICD-10-CM | POA: Insufficient documentation

## 2015-08-01 DIAGNOSIS — D631 Anemia in chronic kidney disease: Secondary | ICD-10-CM | POA: Diagnosis not present

## 2015-08-01 DIAGNOSIS — Z5181 Encounter for therapeutic drug level monitoring: Secondary | ICD-10-CM | POA: Insufficient documentation

## 2015-08-01 LAB — CBC
HEMATOCRIT: 29.1 % — AB (ref 36.0–46.0)
Hemoglobin: 9.3 g/dL — ABNORMAL LOW (ref 12.0–15.0)
MCH: 30.7 pg (ref 26.0–34.0)
MCHC: 32 g/dL (ref 30.0–36.0)
MCV: 96 fL (ref 78.0–100.0)
Platelets: 155 10*3/uL (ref 150–400)
RBC: 3.03 MIL/uL — ABNORMAL LOW (ref 3.87–5.11)
RDW: 13.4 % (ref 11.5–15.5)
WBC: 5 10*3/uL (ref 4.0–10.5)

## 2015-08-01 MED ORDER — DARBEPOETIN ALFA 40 MCG/0.4ML IJ SOSY
40.0000 ug | PREFILLED_SYRINGE | INTRAMUSCULAR | Status: DC
  Administered 2015-08-01: 40 ug via SUBCUTANEOUS

## 2015-08-01 MED ORDER — DARBEPOETIN ALFA 40 MCG/0.4ML IJ SOSY
PREFILLED_SYRINGE | INTRAMUSCULAR | Status: AC
  Administered 2015-08-01: 40 ug via SUBCUTANEOUS
  Filled 2015-08-01: qty 0.4

## 2015-08-17 DIAGNOSIS — R32 Unspecified urinary incontinence: Secondary | ICD-10-CM | POA: Diagnosis not present

## 2015-08-23 DIAGNOSIS — D510 Vitamin B12 deficiency anemia due to intrinsic factor deficiency: Secondary | ICD-10-CM | POA: Diagnosis not present

## 2015-08-23 DIAGNOSIS — Z79899 Other long term (current) drug therapy: Secondary | ICD-10-CM | POA: Diagnosis not present

## 2015-08-23 DIAGNOSIS — Z8673 Personal history of transient ischemic attack (TIA), and cerebral infarction without residual deficits: Secondary | ICD-10-CM | POA: Diagnosis not present

## 2015-08-23 DIAGNOSIS — N183 Chronic kidney disease, stage 3 (moderate): Secondary | ICD-10-CM | POA: Diagnosis not present

## 2015-08-23 DIAGNOSIS — I872 Venous insufficiency (chronic) (peripheral): Secondary | ICD-10-CM | POA: Diagnosis not present

## 2015-08-23 DIAGNOSIS — Z7902 Long term (current) use of antithrombotics/antiplatelets: Secondary | ICD-10-CM | POA: Diagnosis not present

## 2015-08-23 DIAGNOSIS — I129 Hypertensive chronic kidney disease with stage 1 through stage 4 chronic kidney disease, or unspecified chronic kidney disease: Secondary | ICD-10-CM | POA: Diagnosis not present

## 2015-08-29 ENCOUNTER — Encounter (HOSPITAL_COMMUNITY)
Admission: RE | Admit: 2015-08-29 | Discharge: 2015-08-29 | Disposition: A | Discharge status: Home / Self Care | Payer: Medicare Other | Source: Ambulatory Visit | Attending: Nephrology | Admitting: Nephrology

## 2015-08-29 DIAGNOSIS — D631 Anemia in chronic kidney disease: Secondary | ICD-10-CM | POA: Insufficient documentation

## 2015-08-29 DIAGNOSIS — Z79899 Other long term (current) drug therapy: Secondary | ICD-10-CM | POA: Diagnosis not present

## 2015-08-29 DIAGNOSIS — Z5181 Encounter for therapeutic drug level monitoring: Secondary | ICD-10-CM | POA: Diagnosis not present

## 2015-08-29 DIAGNOSIS — N184 Chronic kidney disease, stage 4 (severe): Secondary | ICD-10-CM | POA: Insufficient documentation

## 2015-08-29 LAB — FERRITIN: Ferritin: 302 ng/mL (ref 11–307)

## 2015-08-29 LAB — IRON AND TIBC
IRON: 66 ug/dL (ref 28–170)
Saturation Ratios: 27 % (ref 10.4–31.8)
TIBC: 245 ug/dL — AB (ref 250–450)
UIBC: 179 ug/dL

## 2015-08-29 LAB — RENAL FUNCTION PANEL
ANION GAP: 9 (ref 5–15)
Albumin: 3.6 g/dL (ref 3.5–5.0)
BUN: 57 mg/dL — ABNORMAL HIGH (ref 6–20)
CHLORIDE: 111 mmol/L (ref 101–111)
CO2: 24 mmol/L (ref 22–32)
Calcium: 9.7 mg/dL (ref 8.9–10.3)
Creatinine, Ser: 2.3 mg/dL — ABNORMAL HIGH (ref 0.44–1.00)
GFR calc non Af Amer: 17 mL/min — ABNORMAL LOW (ref >60)
GFR, EST AFRICAN AMERICAN: 20 mL/min — AB (ref >60)
GLUCOSE: 113 mg/dL — AB (ref 65–99)
Phosphorus: 4.1 mg/dL (ref 2.5–4.6)
Potassium: 5.4 mmol/L — ABNORMAL HIGH (ref 3.5–5.1)
Sodium: 144 mmol/L (ref 135–145)

## 2015-08-29 LAB — CBC
HEMATOCRIT: 30.8 % — AB (ref 36.0–46.0)
HEMOGLOBIN: 10.1 g/dL — AB (ref 12.0–15.0)
MCH: 31.6 pg (ref 26.0–34.0)
MCHC: 32.8 g/dL (ref 30.0–36.0)
MCV: 96.3 fL (ref 78.0–100.0)
Platelets: 145 10*3/uL — ABNORMAL LOW (ref 150–400)
RBC: 3.2 MIL/uL — AB (ref 3.87–5.11)
RDW: 13 % (ref 11.5–15.5)
WBC: 5.3 10*3/uL (ref 4.0–10.5)

## 2015-08-29 MED ORDER — DARBEPOETIN ALFA 40 MCG/0.4ML IJ SOSY
PREFILLED_SYRINGE | INTRAMUSCULAR | Status: AC
  Administered 2015-08-29: 40 ug via SUBCUTANEOUS
  Filled 2015-08-29: qty 0.4

## 2015-08-29 MED ORDER — DARBEPOETIN ALFA 40 MCG/0.4ML IJ SOSY
40.0000 ug | PREFILLED_SYRINGE | INTRAMUSCULAR | Status: DC
  Administered 2015-08-29: 40 ug via SUBCUTANEOUS

## 2015-08-30 LAB — POCT HEMOGLOBIN-HEMACUE: Hemoglobin: 10.7 g/dL — ABNORMAL LOW (ref 12.0–15.0)

## 2015-09-14 DIAGNOSIS — R32 Unspecified urinary incontinence: Secondary | ICD-10-CM | POA: Diagnosis not present

## 2015-09-20 DIAGNOSIS — Z79899 Other long term (current) drug therapy: Secondary | ICD-10-CM | POA: Diagnosis not present

## 2015-09-20 DIAGNOSIS — I129 Hypertensive chronic kidney disease with stage 1 through stage 4 chronic kidney disease, or unspecified chronic kidney disease: Secondary | ICD-10-CM | POA: Diagnosis not present

## 2015-09-20 DIAGNOSIS — Z8673 Personal history of transient ischemic attack (TIA), and cerebral infarction without residual deficits: Secondary | ICD-10-CM | POA: Diagnosis not present

## 2015-09-20 DIAGNOSIS — I872 Venous insufficiency (chronic) (peripheral): Secondary | ICD-10-CM | POA: Diagnosis not present

## 2015-09-20 DIAGNOSIS — N183 Chronic kidney disease, stage 3 (moderate): Secondary | ICD-10-CM | POA: Diagnosis not present

## 2015-09-20 DIAGNOSIS — Z7902 Long term (current) use of antithrombotics/antiplatelets: Secondary | ICD-10-CM | POA: Diagnosis not present

## 2015-09-20 DIAGNOSIS — D510 Vitamin B12 deficiency anemia due to intrinsic factor deficiency: Secondary | ICD-10-CM | POA: Diagnosis not present

## 2015-09-26 ENCOUNTER — Encounter (HOSPITAL_COMMUNITY): Payer: Medicare Other

## 2015-09-27 DIAGNOSIS — I872 Venous insufficiency (chronic) (peripheral): Secondary | ICD-10-CM | POA: Diagnosis not present

## 2015-09-27 DIAGNOSIS — D510 Vitamin B12 deficiency anemia due to intrinsic factor deficiency: Secondary | ICD-10-CM | POA: Diagnosis not present

## 2015-09-27 DIAGNOSIS — F0390 Unspecified dementia without behavioral disturbance: Secondary | ICD-10-CM | POA: Diagnosis not present

## 2015-09-27 DIAGNOSIS — Z79899 Other long term (current) drug therapy: Secondary | ICD-10-CM | POA: Diagnosis not present

## 2015-09-28 ENCOUNTER — Encounter (HOSPITAL_COMMUNITY)
Admission: RE | Admit: 2015-09-28 | Discharge: 2015-09-28 | Disposition: A | Discharge status: Home / Self Care | Payer: Medicare Other | Source: Ambulatory Visit | Attending: Nephrology | Admitting: Nephrology

## 2015-09-28 DIAGNOSIS — Z79899 Other long term (current) drug therapy: Secondary | ICD-10-CM | POA: Diagnosis not present

## 2015-09-28 DIAGNOSIS — Z5181 Encounter for therapeutic drug level monitoring: Secondary | ICD-10-CM | POA: Diagnosis not present

## 2015-09-28 DIAGNOSIS — N184 Chronic kidney disease, stage 4 (severe): Secondary | ICD-10-CM | POA: Insufficient documentation

## 2015-09-28 DIAGNOSIS — D631 Anemia in chronic kidney disease: Secondary | ICD-10-CM | POA: Insufficient documentation

## 2015-09-28 LAB — CBC
HCT: 30.9 % — ABNORMAL LOW (ref 36.0–46.0)
HEMOGLOBIN: 9.8 g/dL — AB (ref 12.0–15.0)
MCH: 30.3 pg (ref 26.0–34.0)
MCHC: 31.7 g/dL (ref 30.0–36.0)
MCV: 95.7 fL (ref 78.0–100.0)
PLATELETS: 161 10*3/uL (ref 150–400)
RBC: 3.23 MIL/uL — ABNORMAL LOW (ref 3.87–5.11)
RDW: 12.7 % (ref 11.5–15.5)
WBC: 5.2 10*3/uL (ref 4.0–10.5)

## 2015-09-28 MED ORDER — DARBEPOETIN ALFA 40 MCG/0.4ML IJ SOSY
40.0000 ug | PREFILLED_SYRINGE | INTRAMUSCULAR | Status: DC
  Administered 2015-09-28: 40 ug via SUBCUTANEOUS

## 2015-09-28 MED ORDER — DARBEPOETIN ALFA 40 MCG/0.4ML IJ SOSY
PREFILLED_SYRINGE | INTRAMUSCULAR | Status: AC
  Filled 2015-09-28: qty 0.4

## 2015-10-06 ENCOUNTER — Ambulatory Visit: Payer: Medicare Other | Admitting: Family Medicine

## 2015-10-19 ENCOUNTER — Encounter: Payer: Self-pay | Admitting: Podiatry

## 2015-10-19 ENCOUNTER — Ambulatory Visit (INDEPENDENT_AMBULATORY_CARE_PROVIDER_SITE_OTHER): Payer: Medicare Other | Admitting: Podiatry

## 2015-10-19 ENCOUNTER — Ambulatory Visit: Payer: Medicare Other | Admitting: Family Medicine

## 2015-10-19 DIAGNOSIS — Z79899 Other long term (current) drug therapy: Secondary | ICD-10-CM | POA: Diagnosis not present

## 2015-10-19 DIAGNOSIS — Z7902 Long term (current) use of antithrombotics/antiplatelets: Secondary | ICD-10-CM | POA: Diagnosis not present

## 2015-10-19 DIAGNOSIS — M79676 Pain in unspecified toe(s): Secondary | ICD-10-CM

## 2015-10-19 DIAGNOSIS — N183 Chronic kidney disease, stage 3 (moderate): Secondary | ICD-10-CM | POA: Diagnosis not present

## 2015-10-19 DIAGNOSIS — Z8673 Personal history of transient ischemic attack (TIA), and cerebral infarction without residual deficits: Secondary | ICD-10-CM | POA: Diagnosis not present

## 2015-10-19 DIAGNOSIS — I129 Hypertensive chronic kidney disease with stage 1 through stage 4 chronic kidney disease, or unspecified chronic kidney disease: Secondary | ICD-10-CM | POA: Diagnosis not present

## 2015-10-19 DIAGNOSIS — B351 Tinea unguium: Secondary | ICD-10-CM | POA: Diagnosis not present

## 2015-10-19 DIAGNOSIS — D510 Vitamin B12 deficiency anemia due to intrinsic factor deficiency: Secondary | ICD-10-CM | POA: Diagnosis not present

## 2015-10-19 DIAGNOSIS — I872 Venous insufficiency (chronic) (peripheral): Secondary | ICD-10-CM | POA: Diagnosis not present

## 2015-10-19 DIAGNOSIS — R32 Unspecified urinary incontinence: Secondary | ICD-10-CM | POA: Diagnosis not present

## 2015-10-19 NOTE — Progress Notes (Signed)
Patient ID: Carrie Grimes, female   DOB: 10/25/1920, 82 y.o.   MRN: [id number] Complaint:  Visit Type: Patient returns to my office for continued preventative foot care services. Complaint: Patient states" my nails have grown long and thick and become painful to walk and wear shoe. The patient presents for preventative foot care services. No changes to ROS  Podiatric Exam: Vascular: dorsalis pedis and posterior tibial pulses are not palpable bilateral. Capillary return is immediate. Cold feet noted. Sensorium: Normal Semmes Weinstein monofilament test. Normal tactile sensation bilaterally. Nail Exam: Pt has thick disfigured discolored nails with subungual debris noted bilateral entire nail hallux through fifth toenails Ulcer Exam: There is no evidence of ulcer or pre-ulcerative changes or infection. Orthopedic Exam: Muscle tone and strength are WNL. No limitations in general ROM. No crepitus or effusions noted. Foot type and digits show no abnormalities. Bony prominences are unremarkable. Skin: No Porokeratosis. No infection or ulcers  Diagnosis:  Onychomycosis, , Pain in right toe, pain in left toes  Treatment & Plan Procedures and Treatment: Consent by patient was obtained for treatment procedures. The patient understood the discussion of treatment and procedures well. All questions were answered thoroughly reviewed. Debridement of mycotic and hypertrophic toenails, 1 through 5 bilateral and clearing of subungual debris. No ulceration, no infection noted.  Return Visit-Office Procedure: Patient instructed to return to the office for a follow up visit 3 months for continued evaluation and treatment.  Keryn Nessler DPM 

## 2015-10-26 ENCOUNTER — Other Ambulatory Visit: Payer: Self-pay | Admitting: Family Medicine

## 2015-10-26 ENCOUNTER — Encounter (HOSPITAL_COMMUNITY): Payer: Medicare Other

## 2015-10-26 ENCOUNTER — Encounter: Payer: Self-pay | Admitting: Family Medicine

## 2015-10-26 ENCOUNTER — Ambulatory Visit (INDEPENDENT_AMBULATORY_CARE_PROVIDER_SITE_OTHER): Payer: Medicare Other | Admitting: Family Medicine

## 2015-10-26 VITALS — BP 159/57 | HR 52 | Temp 97.8°F | Resp 16 | Ht 64.0 in | Wt 112.2 lb

## 2015-10-26 DIAGNOSIS — I10 Essential (primary) hypertension: Secondary | ICD-10-CM | POA: Diagnosis not present

## 2015-10-26 DIAGNOSIS — F03918 Unspecified dementia, unspecified severity, with other behavioral disturbance: Secondary | ICD-10-CM

## 2015-10-26 DIAGNOSIS — H9193 Unspecified hearing loss, bilateral: Secondary | ICD-10-CM

## 2015-10-26 DIAGNOSIS — I872 Venous insufficiency (chronic) (peripheral): Secondary | ICD-10-CM | POA: Diagnosis not present

## 2015-10-26 DIAGNOSIS — E875 Hyperkalemia: Secondary | ICD-10-CM

## 2015-10-26 DIAGNOSIS — F0391 Unspecified dementia with behavioral disturbance: Secondary | ICD-10-CM

## 2015-10-26 DIAGNOSIS — N189 Chronic kidney disease, unspecified: Secondary | ICD-10-CM | POA: Diagnosis not present

## 2015-10-26 DIAGNOSIS — N184 Chronic kidney disease, stage 4 (severe): Secondary | ICD-10-CM | POA: Insufficient documentation

## 2015-10-26 DIAGNOSIS — Z79899 Other long term (current) drug therapy: Secondary | ICD-10-CM

## 2015-10-26 LAB — COMPREHENSIVE METABOLIC PANEL
ALK PHOS: 81 U/L (ref 39–117)
ALT: 7 U/L (ref 0–35)
AST: 14 U/L (ref 0–37)
Albumin: 3.7 g/dL (ref 3.5–5.2)
BILIRUBIN TOTAL: 0.4 mg/dL (ref 0.2–1.2)
BUN: 52 mg/dL — AB (ref 6–23)
CO2: 27 meq/L (ref 19–32)
Calcium: 9.3 mg/dL (ref 8.4–10.5)
Chloride: 108 mEq/L (ref 96–112)
Creatinine, Ser: 2 mg/dL — ABNORMAL HIGH (ref 0.40–1.20)
GFR: 24.69 mL/min — AB (ref >60.00)
GLUCOSE: 110 mg/dL — AB (ref 70–99)
POTASSIUM: 6 meq/L — AB (ref 3.5–5.1)
Sodium: 143 mEq/L (ref 135–145)
TOTAL PROTEIN: 6.4 g/dL (ref 6.0–8.3)

## 2015-10-26 NOTE — Progress Notes (Signed)
Pre visit review using our clinic review tool, if applicable. No additional management support is needed unless otherwise documented below in the visit note. 

## 2015-10-26 NOTE — Progress Notes (Signed)
OFFICE VISIT  10/26/2015   CC:  Chief Complaint  Patient presents with  . Follow-up   HPI:    Patient is a 82 y.o. Caucasian female who presents for 4 mo f/u dementia with behavioral disturbance, CRI stage IV, and HTN.  Still living at [facility], has an attendant from that facility w/her today. Review of bp checks at [facility] show normal avg bp.  She is no longer on any bp meds. She gets "iron shots" once a month at the hospital through nephrologist. LE swelling unchanged per pt/attendant. No significant episodes of any anger or oppositional behavior.    Her attendant asks if she can be referred for hearing eval/hearing aids.   Past Medical History  Diagnosis Date  . Allergic rhinitis, cause unspecified   . Unspecified hearing loss   . Cerebrovascular disease, unspecified     CDopplers 1/13 showed mild mixed plaque w/ 40-59% (low end) bilat ICA stenoses, the prev right CAE & patch angioplasty were patent  . Unspecified venous (peripheral) insufficiency   . Pure hypercholesterolemia   . Unspecified disorder resulting from impaired renal function     hyperkalemia and anemia  . Diaphragmatic hernia without mention of obstruction or gangrene   . Unspecified vitamin D deficiency   . Dementia with behavioral disturbance     depakote helpful  . Pernicious anemia   . Cataracts, bilateral   . Retinal hemorrhage of right eye   . Chronic renal insufficiency, stage IV (severe)     Dr. Marval Regal  . Essential hypertension   . Choledocholithiasis     12/11/14 CT--nonobstructive (90mm x 6 mm)  . Unspecified cerebral artery occlusion with cerebral infarction     MRI Brain 12/12 showed large remote right hemispheric infarct w/ encephalomalacia w/ prominent small vessel dis superimposed on atrophy... (residual deficits are diminished sensation on left side, left hand grip weakness,   . Impaired mobility     uses walker, wheelchair  . Hyperkalemia 06/12/2015    resolved with kayexolate  x 1 dose + d/c of her ACE-I    Past Surgical History  Procedure Laterality Date  . Cholecystectomy    . Carotid endarterectomy    . Laser surgery on right eye  12/02/2012    Dr. [facility]  . Endoscopic retrograde cholangiopancreatography (ercp) with propofol N/A 03/09/2015    Procedure: ENDOSCOPIC RETROGRADE CHOLANGIOPANCREATOGRAPHY (ERCP) WITH PROPOFOL;  Surgeon: Milus Banister, MD;  Location: [facility];  Service: Endoscopy;  Laterality: N/A;    Outpatient Prescriptions Prior to Visit  Medication Sig Dispense Refill  . acetaminophen (TYLENOL) 500 MG tablet Take 1,000 mg by mouth at bedtime.     . cetirizine (ZYRTEC) 10 MG tablet Take 10 mg by mouth daily.    . clopidogrel (PLAVIX) 75 MG tablet Take 1 tablet (75 mg total) by mouth daily. RESTART THIS IN 3 DAYS 30 tablet 3  . cyanocobalamin (,VITAMIN B-12,) 1000 MCG/ML injection Inject 1,000 mcg into the skin every 30 (thirty) days. On the 12th of every month.    . divalproex (DEPAKOTE SPRINKLE) 125 MG capsule TAKE 1 CAPSULE TWICE DAILY (Patient taking differently: Take 125 mg by mouth 2 (two) times daily. ) 60 capsule 5  . ENSURE PLUS (ENSURE PLUS) LIQD Take 237 mLs by mouth daily.    . furosemide (LASIX) 20 MG tablet Take 1 tablet (20 mg total) by mouth daily. 30 tablet 6  . loperamide (IMODIUM A-D) 2 MG tablet Take 4 mg by mouth  daily.    . loratadine (CLARITIN) 10 MG tablet Take 10 mg by mouth at bedtime as needed for allergies.    . Multiple Vitamin (MULTIVITAMIN) capsule Take 1 capsule by mouth daily.      Marland Kitchen oxybutynin (DITROPAN) 5 MG tablet Take 5 mg by mouth daily.    . ranitidine (ZANTAC) 75 MG tablet Take 75 mg by mouth daily.    . sodium chloride (OCEAN) 0.65 % nasal spray Place 2 sprays into the nose daily as needed for congestion. 1 spray in each nostril    . urea (CARMOL) 20 % cream Apply topically as needed. Reported on 10/26/2015    . vitamin C (ASCORBIC ACID) 500 MG tablet Take 500 mg by mouth daily.      Marland Kitchen amLODipine (NORVASC) 5 MG tablet Take 1 tablet (5 mg total) by mouth daily. (Patient not taking: Reported on 10/26/2015) 30 tablet 11  . Urea in Zn Undecyl-Lactic Acid 45 % EMUL Apply to lower extremities once daily (Patient not taking: Reported on 10/26/2015) 240 mL 6   No facility-administered medications prior to visit.    Allergies  Allergen Reactions  . Procaine Hcl     REACTION: pt states reaction to shot at dentist office w/ tachycardia \\T \ SOB (? if really from combination w/epi)    ROS As per HPI  PE: Blood pressure 159/57, pulse 52, temperature 97.8 F (36.6 C), temperature source Temporal, resp. rate 16, height 5\' 4"  (1.626 m), weight 112 lb 4 oz (50.916 kg), SpO2 99 %. Gen: Alert, well appearing.  Ambulates with a rolling walker. Affect: happy, oriented to person and general place.  Very hard of hearing. ENT: no icterus or pallor. CV: RRR, 2/6 systolic murmur, without rub or gallop. Chest is clear, no wheezing or rales. Normal symmetric air entry throughout both lung fields. No chest wall deformities or tenderness. EXT: she has 3+ pitting edema in both LL's, L calf larger than R chronically.  No erythema, weeping, or rash. Neuro: CN 2-12 intact bilaterally, strength 5/5 in proximal and distal upper extremities and lower extremities bilaterally.  No tremor.    LABS:  Lab Results  Component Value Date   TSH 1.22 09/29/2014   Lab Results  Component Value Date   WBC 5.2 09/28/2015   HGB 9.8* 09/28/2015   HCT 30.9* 09/28/2015   MCV 95.7 09/28/2015   PLT 161 09/28/2015   Lab Results  Component Value Date   CREATININE 2.30* 08/29/2015   BUN 57* 08/29/2015   NA 144 08/29/2015   K 5.4* 08/29/2015   CL 111 08/29/2015   CO2 24 08/29/2015   Lab Results  Component Value Date   ALT 6 01/19/2015   AST 12 01/19/2015   ALKPHOS 89 01/19/2015   BILITOT 0.3 01/19/2015   Lab Results  Component Value Date   CHOL 133 12/02/2012   Lab Results  Component Value Date    HDL 42.60 12/02/2012   Lab Results  Component Value Date   LDLCALC 64 12/02/2012   Lab Results  Component Value Date   TRIG 132.0 12/02/2012   Lab Results  Component Value Date   CHOLHDL 3 12/02/2012    IMPRESSION AND PLAN:  1) Dementia with hx of behavioral disturbance: The current medical regimen is effective;  continue present plan and medications. High risk med use (depakote): check liver panel today.  WBCs and platelets normal on CBC done by nephrology 09/28/15.  2) HTN: stable off of meds at this time.  3) CRI stage IV, with anemia of CRI: she is getting Aranesp injections by her nephrologist. Pt not dialysis candidate: pt and family aware and are in agreement that if renal failure progresses to the point of requiring dialysis then she would enter into hospice care. She has had hyperkalemia in the past: will monitor lytes/cr today.  4) Chronic LE venous insufficiency edema: stable.  Continue lasix 20 mg qd, low Na diet.  5) Chronic hearing impairment, severe and bilateral: refer to audiology.  An After Visit Summary was printed and given to the patient.  FOLLOW UP: Return in about 4 months (around 02/25/2016) for routine chronic illness f/u.  Signed:  Crissie Sickles, MD           10/26/2015

## 2015-10-27 ENCOUNTER — Encounter: Payer: Self-pay | Admitting: *Deleted

## 2015-10-27 ENCOUNTER — Other Ambulatory Visit: Payer: Self-pay | Admitting: *Deleted

## 2015-11-02 ENCOUNTER — Encounter (HOSPITAL_COMMUNITY)
Admission: RE | Admit: 2015-11-02 | Discharge: 2015-11-02 | Disposition: A | Discharge status: Home / Self Care | Payer: Medicare Other | Source: Ambulatory Visit | Attending: Nephrology | Admitting: Nephrology

## 2015-11-02 DIAGNOSIS — Z5181 Encounter for therapeutic drug level monitoring: Secondary | ICD-10-CM | POA: Diagnosis not present

## 2015-11-02 DIAGNOSIS — Z79899 Other long term (current) drug therapy: Secondary | ICD-10-CM | POA: Diagnosis not present

## 2015-11-02 DIAGNOSIS — D631 Anemia in chronic kidney disease: Secondary | ICD-10-CM | POA: Diagnosis not present

## 2015-11-02 DIAGNOSIS — N184 Chronic kidney disease, stage 4 (severe): Secondary | ICD-10-CM | POA: Diagnosis not present

## 2015-11-02 LAB — CBC
HEMATOCRIT: 29.7 % — AB (ref 36.0–46.0)
Hemoglobin: 9.5 g/dL — ABNORMAL LOW (ref 12.0–15.0)
MCH: 31 pg (ref 26.0–34.0)
MCHC: 32 g/dL (ref 30.0–36.0)
MCV: 97.1 fL (ref 78.0–100.0)
PLATELETS: 136 10*3/uL — AB (ref 150–400)
RBC: 3.06 MIL/uL — AB (ref 3.87–5.11)
RDW: 13 % (ref 11.5–15.5)
WBC: 5.9 10*3/uL (ref 4.0–10.5)

## 2015-11-02 LAB — IRON AND TIBC
IRON: 67 ug/dL (ref 28–170)
SATURATION RATIOS: 29 % (ref 10.4–31.8)
TIBC: 231 ug/dL — AB (ref 250–450)
UIBC: 164 ug/dL

## 2015-11-02 LAB — RENAL FUNCTION PANEL
Albumin: 3.2 g/dL — ABNORMAL LOW (ref 3.5–5.0)
Anion gap: 5 (ref 5–15)
BUN: 46 mg/dL — ABNORMAL HIGH (ref 6–20)
CHLORIDE: 110 mmol/L (ref 101–111)
CO2: 28 mmol/L (ref 22–32)
CREATININE: 1.89 mg/dL — AB (ref 0.44–1.00)
Calcium: 9.8 mg/dL (ref 8.9–10.3)
GFR calc Af Amer: 25 mL/min — ABNORMAL LOW (ref >60)
GFR calc non Af Amer: 22 mL/min — ABNORMAL LOW (ref >60)
Glucose, Bld: 128 mg/dL — ABNORMAL HIGH (ref 65–99)
POTASSIUM: 6.3 mmol/L — AB (ref 3.5–5.1)
Phosphorus: 3.8 mg/dL (ref 2.5–4.6)
Sodium: 143 mmol/L (ref 135–145)

## 2015-11-02 LAB — FERRITIN: FERRITIN: 395 ng/mL — AB (ref 11–307)

## 2015-11-02 MED ORDER — DARBEPOETIN ALFA 40 MCG/0.4ML IJ SOSY
40.0000 ug | PREFILLED_SYRINGE | INTRAMUSCULAR | Status: DC
Start: 2015-11-02 — End: 2015-11-03
  Administered 2015-11-02: 40 ug via SUBCUTANEOUS

## 2015-11-02 MED ORDER — DARBEPOETIN ALFA 40 MCG/0.4ML IJ SOSY
PREFILLED_SYRINGE | INTRAMUSCULAR | Status: AC
  Filled 2015-11-02: qty 0.4

## 2015-11-02 NOTE — Progress Notes (Signed)
Pt's labs called to Dr. Elissa Hefty office ie Jan. Hard copy also faxed.  Will handle as an outpt. OK to let pt leave facility per Jan

## 2015-11-03 DIAGNOSIS — E875 Hyperkalemia: Secondary | ICD-10-CM | POA: Diagnosis not present

## 2015-11-09 ENCOUNTER — Encounter: Payer: Self-pay | Admitting: *Deleted

## 2015-11-14 DIAGNOSIS — I129 Hypertensive chronic kidney disease with stage 1 through stage 4 chronic kidney disease, or unspecified chronic kidney disease: Secondary | ICD-10-CM | POA: Diagnosis not present

## 2015-11-14 DIAGNOSIS — N184 Chronic kidney disease, stage 4 (severe): Secondary | ICD-10-CM | POA: Diagnosis not present

## 2015-11-14 DIAGNOSIS — D509 Iron deficiency anemia, unspecified: Secondary | ICD-10-CM | POA: Diagnosis not present

## 2015-11-14 DIAGNOSIS — N2581 Secondary hyperparathyroidism of renal origin: Secondary | ICD-10-CM | POA: Diagnosis not present

## 2015-11-14 DIAGNOSIS — E875 Hyperkalemia: Secondary | ICD-10-CM | POA: Diagnosis not present

## 2015-11-16 ENCOUNTER — Encounter: Payer: Self-pay | Admitting: Family Medicine

## 2015-11-16 DIAGNOSIS — R32 Unspecified urinary incontinence: Secondary | ICD-10-CM | POA: Diagnosis not present

## 2015-11-21 DIAGNOSIS — Z7689 Persons encountering health services in other specified circumstances: Secondary | ICD-10-CM | POA: Diagnosis not present

## 2015-11-23 DIAGNOSIS — Z7689 Persons encountering health services in other specified circumstances: Secondary | ICD-10-CM | POA: Diagnosis not present

## 2015-11-23 DIAGNOSIS — Z79899 Other long term (current) drug therapy: Secondary | ICD-10-CM | POA: Diagnosis not present

## 2015-11-23 DIAGNOSIS — I872 Venous insufficiency (chronic) (peripheral): Secondary | ICD-10-CM | POA: Diagnosis not present

## 2015-11-23 DIAGNOSIS — Z7902 Long term (current) use of antithrombotics/antiplatelets: Secondary | ICD-10-CM | POA: Diagnosis not present

## 2015-11-23 DIAGNOSIS — D510 Vitamin B12 deficiency anemia due to intrinsic factor deficiency: Secondary | ICD-10-CM | POA: Diagnosis not present

## 2015-11-23 DIAGNOSIS — N183 Chronic kidney disease, stage 3 (moderate): Secondary | ICD-10-CM | POA: Diagnosis not present

## 2015-11-23 DIAGNOSIS — I129 Hypertensive chronic kidney disease with stage 1 through stage 4 chronic kidney disease, or unspecified chronic kidney disease: Secondary | ICD-10-CM | POA: Diagnosis not present

## 2015-11-23 DIAGNOSIS — Z8673 Personal history of transient ischemic attack (TIA), and cerebral infarction without residual deficits: Secondary | ICD-10-CM | POA: Diagnosis not present

## 2015-11-28 DIAGNOSIS — Z7689 Persons encountering health services in other specified circumstances: Secondary | ICD-10-CM | POA: Diagnosis not present

## 2015-11-30 ENCOUNTER — Encounter (HOSPITAL_COMMUNITY): Payer: Medicare Other

## 2015-12-05 DIAGNOSIS — Z7902 Long term (current) use of antithrombotics/antiplatelets: Secondary | ICD-10-CM

## 2015-12-05 DIAGNOSIS — F0390 Unspecified dementia without behavioral disturbance: Secondary | ICD-10-CM | POA: Diagnosis not present

## 2015-12-05 DIAGNOSIS — Z8673 Personal history of transient ischemic attack (TIA), and cerebral infarction without residual deficits: Secondary | ICD-10-CM

## 2015-12-05 DIAGNOSIS — I129 Hypertensive chronic kidney disease with stage 1 through stage 4 chronic kidney disease, or unspecified chronic kidney disease: Secondary | ICD-10-CM

## 2015-12-05 DIAGNOSIS — Z79899 Other long term (current) drug therapy: Secondary | ICD-10-CM | POA: Diagnosis not present

## 2015-12-05 DIAGNOSIS — I872 Venous insufficiency (chronic) (peripheral): Secondary | ICD-10-CM | POA: Diagnosis not present

## 2015-12-05 DIAGNOSIS — N183 Chronic kidney disease, stage 3 (moderate): Secondary | ICD-10-CM

## 2015-12-05 DIAGNOSIS — D510 Vitamin B12 deficiency anemia due to intrinsic factor deficiency: Secondary | ICD-10-CM | POA: Diagnosis not present

## 2015-12-14 DIAGNOSIS — I129 Hypertensive chronic kidney disease with stage 1 through stage 4 chronic kidney disease, or unspecified chronic kidney disease: Secondary | ICD-10-CM

## 2015-12-14 DIAGNOSIS — N183 Chronic kidney disease, stage 3 (moderate): Secondary | ICD-10-CM

## 2015-12-14 DIAGNOSIS — Z79899 Other long term (current) drug therapy: Secondary | ICD-10-CM | POA: Diagnosis not present

## 2015-12-14 DIAGNOSIS — I872 Venous insufficiency (chronic) (peripheral): Secondary | ICD-10-CM | POA: Diagnosis not present

## 2015-12-14 DIAGNOSIS — F0390 Unspecified dementia without behavioral disturbance: Secondary | ICD-10-CM | POA: Diagnosis not present

## 2015-12-14 DIAGNOSIS — Z7902 Long term (current) use of antithrombotics/antiplatelets: Secondary | ICD-10-CM

## 2015-12-14 DIAGNOSIS — D510 Vitamin B12 deficiency anemia due to intrinsic factor deficiency: Secondary | ICD-10-CM | POA: Diagnosis not present

## 2015-12-14 DIAGNOSIS — Z8673 Personal history of transient ischemic attack (TIA), and cerebral infarction without residual deficits: Secondary | ICD-10-CM

## 2015-12-21 DIAGNOSIS — Z79899 Other long term (current) drug therapy: Secondary | ICD-10-CM | POA: Diagnosis not present

## 2015-12-21 DIAGNOSIS — Z7902 Long term (current) use of antithrombotics/antiplatelets: Secondary | ICD-10-CM | POA: Diagnosis not present

## 2015-12-21 DIAGNOSIS — N183 Chronic kidney disease, stage 3 (moderate): Secondary | ICD-10-CM | POA: Diagnosis not present

## 2015-12-21 DIAGNOSIS — I872 Venous insufficiency (chronic) (peripheral): Secondary | ICD-10-CM | POA: Diagnosis not present

## 2015-12-21 DIAGNOSIS — D510 Vitamin B12 deficiency anemia due to intrinsic factor deficiency: Secondary | ICD-10-CM | POA: Diagnosis not present

## 2015-12-21 DIAGNOSIS — I129 Hypertensive chronic kidney disease with stage 1 through stage 4 chronic kidney disease, or unspecified chronic kidney disease: Secondary | ICD-10-CM | POA: Diagnosis not present

## 2015-12-21 DIAGNOSIS — Z8673 Personal history of transient ischemic attack (TIA), and cerebral infarction without residual deficits: Secondary | ICD-10-CM | POA: Diagnosis not present

## 2016-01-09 ENCOUNTER — Encounter (HOSPITAL_COMMUNITY)
Admission: RE | Admit: 2016-01-09 | Discharge: 2016-01-09 | Disposition: A | Discharge status: Home / Self Care | Payer: Medicare Other | Source: Ambulatory Visit | Attending: Nephrology | Admitting: Nephrology

## 2016-01-09 DIAGNOSIS — Z79899 Other long term (current) drug therapy: Secondary | ICD-10-CM | POA: Insufficient documentation

## 2016-01-09 DIAGNOSIS — N184 Chronic kidney disease, stage 4 (severe): Secondary | ICD-10-CM | POA: Insufficient documentation

## 2016-01-09 DIAGNOSIS — Z5181 Encounter for therapeutic drug level monitoring: Secondary | ICD-10-CM | POA: Diagnosis not present

## 2016-01-09 DIAGNOSIS — D631 Anemia in chronic kidney disease: Secondary | ICD-10-CM | POA: Diagnosis not present

## 2016-01-09 LAB — RENAL FUNCTION PANEL
ALBUMIN: 3.5 g/dL (ref 3.5–5.0)
Anion gap: 9 (ref 5–15)
BUN: 47 mg/dL — ABNORMAL HIGH (ref 6–20)
CALCIUM: 9.6 mg/dL (ref 8.9–10.3)
CO2: 23 mmol/L (ref 22–32)
CREATININE: 1.94 mg/dL — AB (ref 0.44–1.00)
Chloride: 113 mmol/L — ABNORMAL HIGH (ref 101–111)
GFR, EST AFRICAN AMERICAN: 24 mL/min — AB (ref >60)
GFR, EST NON AFRICAN AMERICAN: 21 mL/min — AB (ref >60)
Glucose, Bld: 126 mg/dL — ABNORMAL HIGH (ref 65–99)
PHOSPHORUS: 3.8 mg/dL (ref 2.5–4.6)
Potassium: 4.5 mmol/L (ref 3.5–5.1)
SODIUM: 145 mmol/L (ref 135–145)

## 2016-01-09 LAB — CBC
HCT: 27.5 % — ABNORMAL LOW (ref 36.0–46.0)
Hemoglobin: 8.7 g/dL — ABNORMAL LOW (ref 12.0–15.0)
MCH: 31.2 pg (ref 26.0–34.0)
MCHC: 31.6 g/dL (ref 30.0–36.0)
MCV: 98.6 fL (ref 78.0–100.0)
PLATELETS: 136 10*3/uL — AB (ref 150–400)
RBC: 2.79 MIL/uL — AB (ref 3.87–5.11)
RDW: 13 % (ref 11.5–15.5)
WBC: 5.4 10*3/uL (ref 4.0–10.5)

## 2016-01-09 LAB — IRON AND TIBC
IRON: 52 ug/dL (ref 28–170)
Saturation Ratios: 22 % (ref 10.4–31.8)
TIBC: 237 ug/dL — ABNORMAL LOW (ref 250–450)
UIBC: 185 ug/dL

## 2016-01-09 LAB — FERRITIN: Ferritin: 221 ng/mL (ref 11–307)

## 2016-01-09 MED ORDER — DARBEPOETIN ALFA 40 MCG/0.4ML IJ SOSY
PREFILLED_SYRINGE | INTRAMUSCULAR | Status: AC
  Filled 2016-01-09: qty 0.4

## 2016-01-09 MED ORDER — DARBEPOETIN ALFA 40 MCG/0.4ML IJ SOSY
40.0000 ug | PREFILLED_SYRINGE | INTRAMUSCULAR | Status: DC
  Administered 2016-01-09: 40 ug via SUBCUTANEOUS

## 2016-01-18 ENCOUNTER — Encounter: Payer: Self-pay | Admitting: Podiatry

## 2016-01-18 ENCOUNTER — Ambulatory Visit (INDEPENDENT_AMBULATORY_CARE_PROVIDER_SITE_OTHER): Payer: Medicare Other | Admitting: Podiatry

## 2016-01-18 VITALS — BP 154/90 | HR 55 | Resp 14

## 2016-01-18 DIAGNOSIS — I872 Venous insufficiency (chronic) (peripheral): Secondary | ICD-10-CM | POA: Diagnosis not present

## 2016-01-18 DIAGNOSIS — N183 Chronic kidney disease, stage 3 (moderate): Secondary | ICD-10-CM | POA: Diagnosis not present

## 2016-01-18 DIAGNOSIS — Z7902 Long term (current) use of antithrombotics/antiplatelets: Secondary | ICD-10-CM | POA: Diagnosis not present

## 2016-01-18 DIAGNOSIS — Z8673 Personal history of transient ischemic attack (TIA), and cerebral infarction without residual deficits: Secondary | ICD-10-CM | POA: Diagnosis not present

## 2016-01-18 DIAGNOSIS — B351 Tinea unguium: Secondary | ICD-10-CM | POA: Diagnosis not present

## 2016-01-18 DIAGNOSIS — D510 Vitamin B12 deficiency anemia due to intrinsic factor deficiency: Secondary | ICD-10-CM | POA: Diagnosis not present

## 2016-01-18 DIAGNOSIS — M79676 Pain in unspecified toe(s): Secondary | ICD-10-CM | POA: Diagnosis not present

## 2016-01-18 DIAGNOSIS — Z79899 Other long term (current) drug therapy: Secondary | ICD-10-CM | POA: Diagnosis not present

## 2016-01-18 DIAGNOSIS — I129 Hypertensive chronic kidney disease with stage 1 through stage 4 chronic kidney disease, or unspecified chronic kidney disease: Secondary | ICD-10-CM | POA: Diagnosis not present

## 2016-01-18 NOTE — Progress Notes (Signed)
Patient ID: Carrie Grimes, female   DOB: [birth year], 82 y.o.   MRN: [id number] Complaint:  Visit Type: Patient returns to my office for continued preventative foot care services. Complaint: Patient states" my nails have grown long and thick and become painful to walk and wear shoe. The patient presents for preventative foot care services. No changes to ROS  Podiatric Exam: Vascular: dorsalis pedis and posterior tibial pulses are not palpable bilateral. Capillary return is immediate. Cold feet noted. Sensorium: Normal Semmes Weinstein monofilament test. Normal tactile sensation bilaterally. Nail Exam: Pt has thick disfigured discolored nails with subungual debris noted bilateral entire nail hallux through fifth toenails Ulcer Exam: There is no evidence of ulcer or pre-ulcerative changes or infection. Orthopedic Exam: Muscle tone and strength are WNL. No limitations in general ROM. No crepitus or effusions noted. Foot type and digits show no abnormalities. Bony prominences are unremarkable. Skin: No Porokeratosis. No infection or ulcers  Diagnosis:  Onychomycosis, , Pain in right toe, pain in left toes  Treatment & Plan Procedures and Treatment: Consent by patient was obtained for treatment procedures. The patient understood the discussion of treatment and procedures well. All questions were answered thoroughly reviewed. Debridement of mycotic and hypertrophic toenails, 1 through 5 bilateral and clearing of subungual debris. No ulceration, no infection noted.  Return Visit-Office Procedure: Patient instructed to return to the office for a follow up visit 3 months for continued evaluation and treatment.  Gardiner Barefoot DPM

## 2016-02-06 ENCOUNTER — Encounter (HOSPITAL_COMMUNITY)
Admission: RE | Admit: 2016-02-06 | Discharge: 2016-02-06 | Disposition: A | Discharge status: Home / Self Care | Payer: Medicare Other | Source: Ambulatory Visit | Attending: Nephrology | Admitting: Nephrology

## 2016-02-06 DIAGNOSIS — Z5181 Encounter for therapeutic drug level monitoring: Secondary | ICD-10-CM | POA: Diagnosis not present

## 2016-02-06 DIAGNOSIS — D631 Anemia in chronic kidney disease: Secondary | ICD-10-CM | POA: Diagnosis not present

## 2016-02-06 DIAGNOSIS — Z79899 Other long term (current) drug therapy: Secondary | ICD-10-CM | POA: Insufficient documentation

## 2016-02-06 DIAGNOSIS — N184 Chronic kidney disease, stage 4 (severe): Secondary | ICD-10-CM | POA: Diagnosis not present

## 2016-02-06 LAB — CBC
HCT: 28.5 % — ABNORMAL LOW (ref 36.0–46.0)
Hemoglobin: 9.1 g/dL — ABNORMAL LOW (ref 12.0–15.0)
MCH: 31.2 pg (ref 26.0–34.0)
MCHC: 31.9 g/dL (ref 30.0–36.0)
MCV: 97.6 fL (ref 78.0–100.0)
PLATELETS: 141 10*3/uL — AB (ref 150–400)
RBC: 2.92 MIL/uL — ABNORMAL LOW (ref 3.87–5.11)
RDW: 12.7 % (ref 11.5–15.5)
WBC: 5.3 10*3/uL (ref 4.0–10.5)

## 2016-02-06 MED ORDER — DARBEPOETIN ALFA 40 MCG/0.4ML IJ SOSY
40.0000 ug | PREFILLED_SYRINGE | INTRAMUSCULAR | Status: DC
  Administered 2016-02-06: 40 ug via SUBCUTANEOUS

## 2016-02-06 MED ORDER — DARBEPOETIN ALFA 40 MCG/0.4ML IJ SOSY
PREFILLED_SYRINGE | INTRAMUSCULAR | Status: AC
  Filled 2016-02-06: qty 0.4

## 2016-02-15 DIAGNOSIS — Z8673 Personal history of transient ischemic attack (TIA), and cerebral infarction without residual deficits: Secondary | ICD-10-CM | POA: Diagnosis not present

## 2016-02-15 DIAGNOSIS — I129 Hypertensive chronic kidney disease with stage 1 through stage 4 chronic kidney disease, or unspecified chronic kidney disease: Secondary | ICD-10-CM | POA: Diagnosis not present

## 2016-02-15 DIAGNOSIS — D510 Vitamin B12 deficiency anemia due to intrinsic factor deficiency: Secondary | ICD-10-CM | POA: Diagnosis not present

## 2016-02-15 DIAGNOSIS — Z7902 Long term (current) use of antithrombotics/antiplatelets: Secondary | ICD-10-CM | POA: Diagnosis not present

## 2016-02-15 DIAGNOSIS — Z79899 Other long term (current) drug therapy: Secondary | ICD-10-CM | POA: Diagnosis not present

## 2016-02-15 DIAGNOSIS — N183 Chronic kidney disease, stage 3 (moderate): Secondary | ICD-10-CM | POA: Diagnosis not present

## 2016-02-15 DIAGNOSIS — I872 Venous insufficiency (chronic) (peripheral): Secondary | ICD-10-CM | POA: Diagnosis not present

## 2016-02-20 ENCOUNTER — Ambulatory Visit (INDEPENDENT_AMBULATORY_CARE_PROVIDER_SITE_OTHER): Payer: Medicare Other | Admitting: Family Medicine

## 2016-02-20 ENCOUNTER — Encounter: Payer: Self-pay | Admitting: Family Medicine

## 2016-02-20 VITALS — BP 160/71 | HR 59 | Temp 99.4°F | Resp 16 | Wt 113.1 lb

## 2016-02-20 DIAGNOSIS — N184 Chronic kidney disease, stage 4 (severe): Secondary | ICD-10-CM

## 2016-02-20 DIAGNOSIS — F0391 Unspecified dementia with behavioral disturbance: Secondary | ICD-10-CM

## 2016-02-20 DIAGNOSIS — I10 Essential (primary) hypertension: Secondary | ICD-10-CM | POA: Diagnosis not present

## 2016-02-20 LAB — BASIC METABOLIC PANEL
BUN: 48 mg/dL — AB (ref 6–23)
CHLORIDE: 109 meq/L (ref 96–112)
CO2: 27 mEq/L (ref 19–32)
CREATININE: 1.96 mg/dL — AB (ref 0.40–1.20)
Calcium: 9.7 mg/dL (ref 8.4–10.5)
GFR: 25.26 mL/min — AB (ref >60.00)
GLUCOSE: 134 mg/dL — AB (ref 70–99)
Potassium: 6 mEq/L — ABNORMAL HIGH (ref 3.5–5.1)
Sodium: 143 mEq/L (ref 135–145)

## 2016-02-20 NOTE — Progress Notes (Signed)
Pre visit review using our clinic review tool, if applicable. No additional management support is needed unless otherwise documented below in the visit note. 

## 2016-02-20 NOTE — Progress Notes (Signed)
OFFICE VISIT  02/20/2016   CC:  Chief Complaint  Patient presents with  . Follow-up    dementia/ sore on right little toe-/bilateral leg edema   HPI:    Patient is a 82 y.o. Caucasian female who presents unaccompanied today for 4 mo f/u dementia with behavioral disturbance, CRI stage IV, and hx of HTN.  She is no longer on antihypertensive medication. We have not received any report in a long time about her having any behavioral problems at her ALF.  Reviewed bp's at ALF from all of this month and avg is 130/60, HR avg 60.  Most recent nephrology f/u was 11/14/15: note reviewed.  Lab showed Cr 2.12 and potassium 6.9.  It is not clear from their note what was done about her elevated potassium level at that time, but in the past she has been treated with kayexolate and an increase in her lasix dosing. For anemia of CRI she receives aranesp 40 mcg injections monthly, most recent was 02/06/16.  She got hearing aids and is very happy with them.  She feels tired/sleepy from her daytime dose of depakote.    Past Medical History:  Diagnosis Date  . Allergic rhinitis, cause unspecified   . Cataracts, bilateral   . Cerebrovascular disease, unspecified    CDopplers 1/13 showed mild mixed plaque w/ 40-59% (low end) bilat ICA stenoses, the prev right CAE & patch angioplasty were patent  . Choledocholithiasis    12/11/14 CT--nonobstructive (60mm x 6 mm)  . Chronic renal insufficiency, stage IV (severe) (HCC)    Dr. Marval Regal  . Dementia with behavioral disturbance    depakote helpful  . Diaphragmatic hernia without mention of obstruction or gangrene   . Essential hypertension   . Hyperkalemia 06/12/2015   resolved with kayexolate x 1 dose + d/c of her ACE-I.  Recurrence 10/2015: kayexolate per nephrology helped.  However, as of last nephrol note, her K was 6.9 on 11/14/15---need daily dose of kayexolate?    . Impaired mobility    uses walker, wheelchair  . Pernicious anemia   . Pure  hypercholesterolemia   . Retinal hemorrhage of right eye   . Unspecified cerebral artery occlusion with cerebral infarction    MRI Brain 12/12 showed large remote right hemispheric infarct w/ encephalomalacia w/ prominent small vessel dis superimposed on atrophy... (residual deficits are diminished sensation on left side, left hand grip weakness,   . Unspecified disorder resulting from impaired renal function    hyperkalemia and anemia  . Unspecified hearing loss   . Unspecified venous (peripheral) insufficiency   . Unspecified vitamin D deficiency     Past Surgical History:  Procedure Laterality Date  . CAROTID ENDARTERECTOMY    . CHOLECYSTECTOMY    . ENDOSCOPIC RETROGRADE CHOLANGIOPANCREATOGRAPHY (ERCP) WITH PROPOFOL N/A 03/09/2015   Procedure: ENDOSCOPIC RETROGRADE CHOLANGIOPANCREATOGRAPHY (ERCP) WITH PROPOFOL;  Surgeon: Milus Banister, MD;  Location: [facility];  Service: Endoscopy;  Laterality: N/A;  . laser surgery on right eye  12/02/2012   Dr. [facility]    Outpatient Medications Prior to Visit  Medication Sig Dispense Refill  . acetaminophen (TYLENOL) 500 MG tablet Take 1,000 mg by mouth at bedtime.     . cetirizine (ZYRTEC) 10 MG tablet Take 10 mg by mouth daily.    . clopidogrel (PLAVIX) 75 MG tablet Take 1 tablet (75 mg total) by mouth daily. RESTART THIS IN 3 DAYS 30 tablet 3  . cyanocobalamin (,VITAMIN B-12,) 1000 MCG/ML injection Inject 1,000  mcg into the skin every 30 (thirty) days. On the 12th of every month.    . divalproex (DEPAKOTE SPRINKLE) 125 MG capsule TAKE 1 CAPSULE TWICE DAILY (Patient taking differently: Take 125 mg by mouth 2 (two) times daily. ) 60 capsule 5  . ENSURE PLUS (ENSURE PLUS) LIQD Take 237 mLs by mouth daily.    . furosemide (LASIX) 20 MG tablet Take 1 tablet (20 mg total) by mouth daily. 30 tablet 6  . loperamide (IMODIUM A-D) 2 MG tablet Take 4 mg by mouth daily.    Marland Kitchen loratadine (CLARITIN) 10 MG tablet Take 10 mg by mouth at  bedtime as needed for allergies.    . Multiple Vitamin (MULTIVITAMIN) capsule Take 1 capsule by mouth daily.      Marland Kitchen oxybutynin (DITROPAN) 5 MG tablet Take 5 mg by mouth daily.    . ranitidine (ZANTAC) 75 MG tablet Take 75 mg by mouth daily.    . sodium chloride (OCEAN) 0.65 % nasal spray Place 2 sprays into the nose daily as needed for congestion. 1 spray in each nostril    . urea (CARMOL) 20 % cream Apply topically as needed. Reported on 10/26/2015    . vitamin C (ASCORBIC ACID) 500 MG tablet Take 500 mg by mouth daily.      No facility-administered medications prior to visit.     Allergies  Allergen Reactions  . Procaine Hcl Other (See Comments)    REACTION: pt states reaction to shot at dentist office w/ tachycardia \\T \ SOB (? if really from combination w/epi)    ROS As per HPI  PE: Blood pressure (!) 160/71, pulse (!) 59, temperature 99.4 F (37.4 C), temperature source Temporal, resp. rate 16, weight 113 lb 1.9 oz (51.3 kg), SpO2 99 %. Gen: alert, pleasantly demented. Pleasant. CV: RRR, soft systolic murmur, no rub or gallop. LUNGS: CTA bilat, nonlabored. EXT: 3+ bilat pitting edema in LL's. Right 5th toe lateral aspect with small corn.  LABS:  Lab Results  Component Value Date   TSH 1.22 09/29/2014   Lab Results  Component Value Date   WBC 5.3 02/06/2016   HGB 9.1 (L) 02/06/2016   HCT 28.5 (L) 02/06/2016   MCV 97.6 02/06/2016   PLT 141 (L) 02/06/2016   Lab Results  Component Value Date   CREATININE 1.94 (H) 01/09/2016   BUN 47 (H) 01/09/2016   NA 145 01/09/2016   K 4.5 01/09/2016   CL 113 (H) 01/09/2016   CO2 23 01/09/2016   Lab Results  Component Value Date   ALT 7 10/26/2015   AST 14 10/26/2015   ALKPHOS 81 10/26/2015   BILITOT 0.4 10/26/2015   Lab Results  Component Value Date   CHOL 133 12/02/2012   Lab Results  Component Value Date   HDL 42.60 12/02/2012   Lab Results  Component Value Date   LDLCALC 64 12/02/2012   Lab Results   Component Value Date   TRIG 132.0 12/02/2012   Lab Results  Component Value Date   CHOLHDL 3 12/02/2012    IMPRESSION AND PLAN:  1) Dementia w/behav disturbance: stable.  I will d/c her morning dose of depakote since her behavior has not been a problem plus she feels like this med makes her sleepy.  2) HTN; The current medical regimen is effective;  continue present plan and medications. Lytes/Cr today.  3) CRI stage IV:  Followed by nephrology--most recent potassium 4.5. I rechecked a BMET here today.  An After Visit  Summary was printed and given to the patient.  FOLLOW UP: Return in about 4 months (around 06/22/2016) for routine chronic illness f/u (30 min).  Signed:  Crissie Sickles, MD           02/20/2016

## 2016-02-20 NOTE — Addendum Note (Signed)
Addended by: Ralph Dowdy on: 02/20/2016 11:59 AM   Modules accepted: Orders

## 2016-02-21 ENCOUNTER — Other Ambulatory Visit: Payer: Self-pay | Admitting: Family Medicine

## 2016-02-21 MED ORDER — SODIUM POLYSTYRENE SULFONATE PO POWD: ORAL | 0 refills | Status: DC

## 2016-03-04 ENCOUNTER — Other Ambulatory Visit (HOSPITAL_COMMUNITY): Payer: Self-pay | Admitting: *Deleted

## 2016-03-05 ENCOUNTER — Encounter (HOSPITAL_COMMUNITY)
Admission: RE | Admit: 2016-03-05 | Discharge: 2016-03-05 | Disposition: A | Discharge status: Home / Self Care | Payer: Medicare Other | Source: Ambulatory Visit | Attending: Nephrology | Admitting: Nephrology

## 2016-03-05 DIAGNOSIS — Z5181 Encounter for therapeutic drug level monitoring: Secondary | ICD-10-CM | POA: Insufficient documentation

## 2016-03-05 DIAGNOSIS — Z79899 Other long term (current) drug therapy: Secondary | ICD-10-CM | POA: Diagnosis not present

## 2016-03-05 DIAGNOSIS — D631 Anemia in chronic kidney disease: Secondary | ICD-10-CM | POA: Diagnosis not present

## 2016-03-05 DIAGNOSIS — N184 Chronic kidney disease, stage 4 (severe): Secondary | ICD-10-CM | POA: Diagnosis not present

## 2016-03-05 LAB — RENAL FUNCTION PANEL
ALBUMIN: 3.6 g/dL (ref 3.5–5.0)
Anion gap: 6 (ref 5–15)
BUN: 45 mg/dL — AB (ref 6–20)
CO2: 26 mmol/L (ref 22–32)
Calcium: 9.4 mg/dL (ref 8.9–10.3)
Chloride: 112 mmol/L — ABNORMAL HIGH (ref 101–111)
Creatinine, Ser: 1.87 mg/dL — ABNORMAL HIGH (ref 0.44–1.00)
GFR calc Af Amer: 26 mL/min — ABNORMAL LOW (ref >60)
GFR, EST NON AFRICAN AMERICAN: 22 mL/min — AB (ref >60)
Glucose, Bld: 145 mg/dL — ABNORMAL HIGH (ref 65–99)
PHOSPHORUS: 3.7 mg/dL (ref 2.5–4.6)
POTASSIUM: 4.1 mmol/L (ref 3.5–5.1)
Sodium: 144 mmol/L (ref 135–145)

## 2016-03-05 LAB — CBC
HEMATOCRIT: 28 % — AB (ref 36.0–46.0)
Hemoglobin: 9.2 g/dL — ABNORMAL LOW (ref 12.0–15.0)
MCH: 31.1 pg (ref 26.0–34.0)
MCHC: 32.9 g/dL (ref 30.0–36.0)
MCV: 94.6 fL (ref 78.0–100.0)
PLATELETS: 153 10*3/uL (ref 150–400)
RBC: 2.96 MIL/uL — ABNORMAL LOW (ref 3.87–5.11)
RDW: 12.9 % (ref 11.5–15.5)
WBC: 5.1 10*3/uL (ref 4.0–10.5)

## 2016-03-05 LAB — IRON AND TIBC
Iron: 56 ug/dL (ref 28–170)
SATURATION RATIOS: 22 % (ref 10.4–31.8)
TIBC: 249 ug/dL — ABNORMAL LOW (ref 250–450)
UIBC: 193 ug/dL

## 2016-03-05 LAB — FERRITIN: Ferritin: 247 ng/mL (ref 11–307)

## 2016-03-05 MED ORDER — DARBEPOETIN ALFA 40 MCG/0.4ML IJ SOSY: 40.0000 ug | PREFILLED_SYRINGE | INTRAMUSCULAR | Status: DC

## 2016-03-05 MED ORDER — DARBEPOETIN ALFA 60 MCG/0.3ML IJ SOSY
60.0000 ug | PREFILLED_SYRINGE | Freq: Once | INTRAMUSCULAR | Status: AC
  Administered 2016-03-05: 60 ug via SUBCUTANEOUS

## 2016-03-05 MED ORDER — DARBEPOETIN ALFA 60 MCG/0.3ML IJ SOSY
PREFILLED_SYRINGE | INTRAMUSCULAR | Status: AC
  Administered 2016-03-05: 60 ug via SUBCUTANEOUS
  Filled 2016-03-05: qty 0.3

## 2016-03-20 DIAGNOSIS — Z79899 Other long term (current) drug therapy: Secondary | ICD-10-CM | POA: Diagnosis not present

## 2016-03-20 DIAGNOSIS — Z8673 Personal history of transient ischemic attack (TIA), and cerebral infarction without residual deficits: Secondary | ICD-10-CM | POA: Diagnosis not present

## 2016-03-20 DIAGNOSIS — N183 Chronic kidney disease, stage 3 (moderate): Secondary | ICD-10-CM | POA: Diagnosis not present

## 2016-03-20 DIAGNOSIS — I129 Hypertensive chronic kidney disease with stage 1 through stage 4 chronic kidney disease, or unspecified chronic kidney disease: Secondary | ICD-10-CM | POA: Diagnosis not present

## 2016-03-20 DIAGNOSIS — D510 Vitamin B12 deficiency anemia due to intrinsic factor deficiency: Secondary | ICD-10-CM | POA: Diagnosis not present

## 2016-03-20 DIAGNOSIS — Z7902 Long term (current) use of antithrombotics/antiplatelets: Secondary | ICD-10-CM | POA: Diagnosis not present

## 2016-03-20 DIAGNOSIS — I872 Venous insufficiency (chronic) (peripheral): Secondary | ICD-10-CM | POA: Diagnosis not present

## 2016-04-02 ENCOUNTER — Encounter (HOSPITAL_COMMUNITY)
Admission: RE | Admit: 2016-04-02 | Discharge: 2016-04-02 | Disposition: A | Discharge status: Home / Self Care | Payer: Medicare Other | Source: Ambulatory Visit | Attending: Nephrology | Admitting: Nephrology

## 2016-04-02 DIAGNOSIS — Z5181 Encounter for therapeutic drug level monitoring: Secondary | ICD-10-CM | POA: Insufficient documentation

## 2016-04-02 DIAGNOSIS — D631 Anemia in chronic kidney disease: Secondary | ICD-10-CM | POA: Diagnosis not present

## 2016-04-02 DIAGNOSIS — Z79899 Other long term (current) drug therapy: Secondary | ICD-10-CM | POA: Diagnosis not present

## 2016-04-02 DIAGNOSIS — N184 Chronic kidney disease, stage 4 (severe): Secondary | ICD-10-CM | POA: Insufficient documentation

## 2016-04-02 LAB — CBC
HEMATOCRIT: 31.6 % — AB (ref 36.0–46.0)
Hemoglobin: 10.3 g/dL — ABNORMAL LOW (ref 12.0–15.0)
MCH: 31.3 pg (ref 26.0–34.0)
MCHC: 32.6 g/dL (ref 30.0–36.0)
MCV: 96 fL (ref 78.0–100.0)
Platelets: 158 10*3/uL (ref 150–400)
RBC: 3.29 MIL/uL — ABNORMAL LOW (ref 3.87–5.11)
RDW: 13.1 % (ref 11.5–15.5)
WBC: 4.5 10*3/uL (ref 4.0–10.5)

## 2016-04-02 MED ORDER — DARBEPOETIN ALFA 40 MCG/0.4ML IJ SOSY
PREFILLED_SYRINGE | INTRAMUSCULAR | Status: AC
  Administered 2016-04-02: 40 ug via SUBCUTANEOUS
  Filled 2016-04-02: qty 0.4

## 2016-04-02 MED ORDER — DARBEPOETIN ALFA 60 MCG/0.3ML IJ SOSY
PREFILLED_SYRINGE | INTRAMUSCULAR | Status: AC
  Administered 2016-04-02: 60 ug via SUBCUTANEOUS
  Filled 2016-04-02: qty 0.3

## 2016-04-02 MED ORDER — DARBEPOETIN ALFA 100 MCG/0.5ML IJ SOSY: 100.0000 ug | PREFILLED_SYRINGE | INTRAMUSCULAR | Status: DC

## 2016-04-11 ENCOUNTER — Encounter: Payer: Self-pay | Admitting: Podiatry

## 2016-04-11 ENCOUNTER — Ambulatory Visit (INDEPENDENT_AMBULATORY_CARE_PROVIDER_SITE_OTHER): Payer: Medicare Other | Admitting: Podiatry

## 2016-04-11 VITALS — Ht 64.0 in | Wt 113.0 lb

## 2016-04-11 DIAGNOSIS — L84 Corns and callosities: Secondary | ICD-10-CM | POA: Diagnosis not present

## 2016-04-11 DIAGNOSIS — B351 Tinea unguium: Secondary | ICD-10-CM

## 2016-04-11 DIAGNOSIS — M79676 Pain in unspecified toe(s): Secondary | ICD-10-CM

## 2016-04-11 NOTE — Progress Notes (Signed)
Patient ID: DANITY SCHMELZER, female   DOB: [birth year], 82 y.o.   MRN: [id number] Complaint:  Visit Type: Patient returns to my office for continued preventative foot care services. Complaint: Patient states" my nails have grown long and thick and become painful to walk and wear shoe. The patient presents for preventative foot care services. No changes to ROS  Podiatric Exam: Vascular: dorsalis pedis and posterior tibial pulses are not palpable bilateral. Capillary return is immediate. Cold feet noted. Sensorium: Normal Semmes Weinstein monofilament test. Normal tactile sensation bilaterally. Nail Exam: Pt has thick disfigured discolored nails with subungual debris noted bilateral entire nail hallux through fifth toenails Ulcer Exam: There is no evidence of ulcer or pre-ulcerative changes or infection. Orthopedic Exam: Muscle tone and strength are WNL. No limitations in general ROM. No crepitus or effusions noted. Foot type and digits show no abnormalities. Bony prominences are unremarkable. Skin: No Porokeratosis. No infection or ulcers.  Heloma durum 5th toe right foot  Diagnosis:  Onychomycosis, , Pain in right toe, pain in left toes  Treatment & Plan Procedures and Treatment: Consent by patient was obtained for treatment procedures. The patient understood the discussion of treatment and procedures well. All questions were answered thoroughly reviewed. Debridement of mycotic and hypertrophic toenails, 1 through 5 bilateral and clearing of subungual debris. No ulceration, no infection noted. [place]  HD. Return Visit-Office Procedure: Patient instructed to return to the office for a follow up visit 3 months for continued evaluation and treatment.  Gardiner Barefoot DPM

## 2016-04-18 DIAGNOSIS — D510 Vitamin B12 deficiency anemia due to intrinsic factor deficiency: Secondary | ICD-10-CM | POA: Diagnosis not present

## 2016-04-18 DIAGNOSIS — Z79899 Other long term (current) drug therapy: Secondary | ICD-10-CM | POA: Diagnosis not present

## 2016-04-18 DIAGNOSIS — Z7902 Long term (current) use of antithrombotics/antiplatelets: Secondary | ICD-10-CM | POA: Diagnosis not present

## 2016-04-18 DIAGNOSIS — N183 Chronic kidney disease, stage 3 (moderate): Secondary | ICD-10-CM | POA: Diagnosis not present

## 2016-04-18 DIAGNOSIS — Z8673 Personal history of transient ischemic attack (TIA), and cerebral infarction without residual deficits: Secondary | ICD-10-CM | POA: Diagnosis not present

## 2016-04-18 DIAGNOSIS — I129 Hypertensive chronic kidney disease with stage 1 through stage 4 chronic kidney disease, or unspecified chronic kidney disease: Secondary | ICD-10-CM | POA: Diagnosis not present

## 2016-04-18 DIAGNOSIS — I872 Venous insufficiency (chronic) (peripheral): Secondary | ICD-10-CM | POA: Diagnosis not present

## 2016-04-30 ENCOUNTER — Encounter (HOSPITAL_COMMUNITY)
Admission: RE | Admit: 2016-04-30 | Discharge: 2016-04-30 | Disposition: A | Discharge status: Home / Self Care | Payer: Medicare Other | Source: Ambulatory Visit | Attending: Nephrology | Admitting: Nephrology

## 2016-04-30 DIAGNOSIS — Z79899 Other long term (current) drug therapy: Secondary | ICD-10-CM | POA: Diagnosis not present

## 2016-04-30 DIAGNOSIS — D631 Anemia in chronic kidney disease: Secondary | ICD-10-CM | POA: Diagnosis not present

## 2016-04-30 DIAGNOSIS — N184 Chronic kidney disease, stage 4 (severe): Secondary | ICD-10-CM

## 2016-04-30 DIAGNOSIS — Z5181 Encounter for therapeutic drug level monitoring: Secondary | ICD-10-CM | POA: Diagnosis not present

## 2016-04-30 LAB — RENAL FUNCTION PANEL
ALBUMIN: 3.6 g/dL (ref 3.5–5.0)
Anion gap: 6 (ref 5–15)
BUN: 45 mg/dL — AB (ref 6–20)
CHLORIDE: 111 mmol/L (ref 101–111)
CO2: 26 mmol/L (ref 22–32)
CREATININE: 1.68 mg/dL — AB (ref 0.44–1.00)
Calcium: 9.6 mg/dL (ref 8.9–10.3)
GFR calc Af Amer: 29 mL/min — ABNORMAL LOW (ref >60)
GFR calc non Af Amer: 25 mL/min — ABNORMAL LOW (ref >60)
GLUCOSE: 111 mg/dL — AB (ref 65–99)
POTASSIUM: 5.2 mmol/L — AB (ref 3.5–5.1)
Phosphorus: 3.5 mg/dL (ref 2.5–4.6)
Sodium: 143 mmol/L (ref 135–145)

## 2016-04-30 LAB — CBC
HEMATOCRIT: 31.2 % — AB (ref 36.0–46.0)
Hemoglobin: 10.2 g/dL — ABNORMAL LOW (ref 12.0–15.0)
MCH: 31 pg (ref 26.0–34.0)
MCHC: 32.7 g/dL (ref 30.0–36.0)
MCV: 94.8 fL (ref 78.0–100.0)
Platelets: 162 10*3/uL (ref 150–400)
RBC: 3.29 MIL/uL — ABNORMAL LOW (ref 3.87–5.11)
RDW: 13.2 % (ref 11.5–15.5)
WBC: 4 10*3/uL (ref 4.0–10.5)

## 2016-04-30 LAB — IRON AND TIBC
Iron: 64 ug/dL (ref 28–170)
SATURATION RATIOS: 25 % (ref 10.4–31.8)
TIBC: 258 ug/dL (ref 250–450)
UIBC: 194 ug/dL

## 2016-04-30 LAB — FERRITIN: FERRITIN: 206 ng/mL (ref 11–307)

## 2016-04-30 MED ORDER — DARBEPOETIN ALFA 100 MCG/0.5ML IJ SOSY
100.0000 ug | PREFILLED_SYRINGE | INTRAMUSCULAR | Status: DC
  Administered 2016-04-30: 100 ug via SUBCUTANEOUS

## 2016-04-30 MED ORDER — DARBEPOETIN ALFA 100 MCG/0.5ML IJ SOSY
PREFILLED_SYRINGE | INTRAMUSCULAR | Status: AC
  Administered 2016-04-30: 100 ug via SUBCUTANEOUS
  Filled 2016-04-30: qty 0.5

## 2016-05-17 DIAGNOSIS — Z8673 Personal history of transient ischemic attack (TIA), and cerebral infarction without residual deficits: Secondary | ICD-10-CM | POA: Diagnosis not present

## 2016-05-17 DIAGNOSIS — Z7902 Long term (current) use of antithrombotics/antiplatelets: Secondary | ICD-10-CM | POA: Diagnosis not present

## 2016-05-17 DIAGNOSIS — N183 Chronic kidney disease, stage 3 (moderate): Secondary | ICD-10-CM | POA: Diagnosis not present

## 2016-05-17 DIAGNOSIS — I872 Venous insufficiency (chronic) (peripheral): Secondary | ICD-10-CM | POA: Diagnosis not present

## 2016-05-17 DIAGNOSIS — Z79899 Other long term (current) drug therapy: Secondary | ICD-10-CM | POA: Diagnosis not present

## 2016-05-17 DIAGNOSIS — D510 Vitamin B12 deficiency anemia due to intrinsic factor deficiency: Secondary | ICD-10-CM | POA: Diagnosis not present

## 2016-05-17 DIAGNOSIS — I129 Hypertensive chronic kidney disease with stage 1 through stage 4 chronic kidney disease, or unspecified chronic kidney disease: Secondary | ICD-10-CM | POA: Diagnosis not present

## 2016-05-23 DIAGNOSIS — F0390 Unspecified dementia without behavioral disturbance: Secondary | ICD-10-CM

## 2016-05-23 DIAGNOSIS — Z7902 Long term (current) use of antithrombotics/antiplatelets: Secondary | ICD-10-CM

## 2016-05-23 DIAGNOSIS — D510 Vitamin B12 deficiency anemia due to intrinsic factor deficiency: Secondary | ICD-10-CM | POA: Diagnosis not present

## 2016-05-23 DIAGNOSIS — Z79899 Other long term (current) drug therapy: Secondary | ICD-10-CM | POA: Diagnosis not present

## 2016-05-23 DIAGNOSIS — I872 Venous insufficiency (chronic) (peripheral): Secondary | ICD-10-CM

## 2016-05-23 DIAGNOSIS — N183 Chronic kidney disease, stage 3 (moderate): Secondary | ICD-10-CM | POA: Diagnosis not present

## 2016-05-23 DIAGNOSIS — Z8673 Personal history of transient ischemic attack (TIA), and cerebral infarction without residual deficits: Secondary | ICD-10-CM

## 2016-05-23 DIAGNOSIS — I120 Hypertensive chronic kidney disease with stage 5 chronic kidney disease or end stage renal disease: Secondary | ICD-10-CM | POA: Diagnosis not present

## 2016-05-28 ENCOUNTER — Encounter (HOSPITAL_COMMUNITY)
Admission: RE | Admit: 2016-05-28 | Discharge: 2016-05-28 | Disposition: A | Discharge status: Home / Self Care | Payer: Medicare Other | Source: Ambulatory Visit | Attending: Nephrology | Admitting: Nephrology

## 2016-05-28 DIAGNOSIS — D631 Anemia in chronic kidney disease: Secondary | ICD-10-CM | POA: Diagnosis not present

## 2016-05-28 DIAGNOSIS — Z79899 Other long term (current) drug therapy: Secondary | ICD-10-CM | POA: Diagnosis not present

## 2016-05-28 DIAGNOSIS — Z5181 Encounter for therapeutic drug level monitoring: Secondary | ICD-10-CM | POA: Diagnosis not present

## 2016-05-28 DIAGNOSIS — N184 Chronic kidney disease, stage 4 (severe): Secondary | ICD-10-CM

## 2016-05-28 MED ORDER — DARBEPOETIN ALFA 100 MCG/0.5ML IJ SOSY
100.0000 ug | PREFILLED_SYRINGE | INTRAMUSCULAR | Status: DC
  Administered 2016-05-28: 100 ug via SUBCUTANEOUS

## 2016-05-28 MED ORDER — DARBEPOETIN ALFA 100 MCG/0.5ML IJ SOSY
PREFILLED_SYRINGE | INTRAMUSCULAR | Status: AC
  Filled 2016-05-28: qty 0.5

## 2016-05-29 LAB — POCT HEMOGLOBIN-HEMACUE: HEMOGLOBIN: 10.1 g/dL — AB (ref 12.0–15.0)

## 2016-05-30 DIAGNOSIS — I129 Hypertensive chronic kidney disease with stage 1 through stage 4 chronic kidney disease, or unspecified chronic kidney disease: Secondary | ICD-10-CM | POA: Diagnosis not present

## 2016-05-30 DIAGNOSIS — N184 Chronic kidney disease, stage 4 (severe): Secondary | ICD-10-CM | POA: Diagnosis not present

## 2016-05-30 DIAGNOSIS — E875 Hyperkalemia: Secondary | ICD-10-CM | POA: Diagnosis not present

## 2016-05-30 DIAGNOSIS — N2581 Secondary hyperparathyroidism of renal origin: Secondary | ICD-10-CM | POA: Diagnosis not present

## 2016-05-30 DIAGNOSIS — D631 Anemia in chronic kidney disease: Secondary | ICD-10-CM | POA: Diagnosis not present

## 2016-06-02 ENCOUNTER — Encounter: Payer: Self-pay | Admitting: Family Medicine

## 2016-06-16 DIAGNOSIS — Z79899 Other long term (current) drug therapy: Secondary | ICD-10-CM | POA: Diagnosis not present

## 2016-06-16 DIAGNOSIS — I872 Venous insufficiency (chronic) (peripheral): Secondary | ICD-10-CM | POA: Diagnosis not present

## 2016-06-16 DIAGNOSIS — I129 Hypertensive chronic kidney disease with stage 1 through stage 4 chronic kidney disease, or unspecified chronic kidney disease: Secondary | ICD-10-CM | POA: Diagnosis not present

## 2016-06-16 DIAGNOSIS — Z8673 Personal history of transient ischemic attack (TIA), and cerebral infarction without residual deficits: Secondary | ICD-10-CM | POA: Diagnosis not present

## 2016-06-16 DIAGNOSIS — N183 Chronic kidney disease, stage 3 (moderate): Secondary | ICD-10-CM | POA: Diagnosis not present

## 2016-06-16 DIAGNOSIS — Z7902 Long term (current) use of antithrombotics/antiplatelets: Secondary | ICD-10-CM | POA: Diagnosis not present

## 2016-06-16 DIAGNOSIS — D510 Vitamin B12 deficiency anemia due to intrinsic factor deficiency: Secondary | ICD-10-CM | POA: Diagnosis not present

## 2016-06-25 ENCOUNTER — Ambulatory Visit (INDEPENDENT_AMBULATORY_CARE_PROVIDER_SITE_OTHER): Payer: Medicare Other | Admitting: Family Medicine

## 2016-06-25 ENCOUNTER — Encounter: Payer: Self-pay | Admitting: Family Medicine

## 2016-06-25 ENCOUNTER — Encounter (HOSPITAL_COMMUNITY): Payer: Medicare Other

## 2016-06-25 VITALS — BP 220/78 | HR 53 | Temp 98.2°F | Resp 16 | Ht 64.0 in | Wt 113.5 lb

## 2016-06-25 DIAGNOSIS — N184 Chronic kidney disease, stage 4 (severe): Secondary | ICD-10-CM | POA: Diagnosis not present

## 2016-06-25 DIAGNOSIS — D631 Anemia in chronic kidney disease: Secondary | ICD-10-CM | POA: Diagnosis not present

## 2016-06-25 DIAGNOSIS — E875 Hyperkalemia: Secondary | ICD-10-CM

## 2016-06-25 DIAGNOSIS — I10 Essential (primary) hypertension: Secondary | ICD-10-CM | POA: Diagnosis not present

## 2016-06-25 DIAGNOSIS — F0391 Unspecified dementia with behavioral disturbance: Secondary | ICD-10-CM

## 2016-06-25 DIAGNOSIS — N3941 Urge incontinence: Secondary | ICD-10-CM

## 2016-06-25 LAB — BASIC METABOLIC PANEL
BUN: 50 mg/dL — ABNORMAL HIGH (ref 6–23)
CALCIUM: 9.5 mg/dL (ref 8.4–10.5)
CO2: 27 mEq/L (ref 19–32)
Chloride: 111 mEq/L (ref 96–112)
Creatinine, Ser: 1.83 mg/dL — ABNORMAL HIGH (ref 0.40–1.20)
GFR: 27.32 mL/min — AB (ref >60.00)
Glucose, Bld: 83 mg/dL (ref 70–99)
Potassium: 6 mEq/L — ABNORMAL HIGH (ref 3.5–5.1)
SODIUM: 143 meq/L (ref 135–145)

## 2016-06-25 LAB — CBC
HCT: 33.3 % — ABNORMAL LOW (ref 36.0–46.0)
Hemoglobin: 11.1 g/dL — ABNORMAL LOW (ref 12.0–15.0)
MCHC: 33.4 g/dL (ref 30.0–36.0)
MCV: 95 fl (ref 78.0–100.0)
PLATELETS: 153 10*3/uL (ref 150.0–400.0)
RBC: 3.51 Mil/uL — AB (ref 3.87–5.11)
RDW: 14.2 % (ref 11.5–15.5)
WBC: 5 10*3/uL (ref 4.0–10.5)

## 2016-06-25 MED ORDER — HYDRALAZINE HCL 25 MG PO TABS: 25.0000 mg | ORAL_TABLET | Freq: Three times a day (TID) | ORAL | 0 refills | Status: DC

## 2016-06-25 NOTE — Progress Notes (Signed)
OFFICE VISIT  06/25/2016   CC:  Chief Complaint  Patient presents with  . Follow-up    RCI, pt is not fasting.    HPI:    Patient is a 82 y.o. Caucasian female who presents for 4 mo f/u HTN, CRI stage IV, dementia with behavioral disturbance. She has seen nephrology and is being followed for CRI and the hyperkalemia and anemia that are secondary to her impaired renal function.  Also has hx of vit B12 def and gets monthly B12 injections. Recent labs done by nephrologist are imported into labs section.  Most recent nephrologist visit was 05/30/16. At that time, her Cr was stable (GFR mid 20s), potassium was stable.  Once weekly BP checks at ALF reviewed today and they have been 120s/50s-60s.  Dementia and mood/behavior are stable and she has no side effects on depakote. She has urge incontinence that is only partially helped by oxybutynin, has to use adult diapers still. No fecal incontinence.   Past Medical History:  Diagnosis Date  . Allergic rhinitis, cause unspecified   . Anemia of chronic kidney failure, stage 4 (severe) (HCC)    Aranesp via nephrologist  . Cataracts, bilateral   . Cerebrovascular disease, unspecified    CDopplers 1/13 showed mild mixed plaque w/ 40-59% (low end) bilat ICA stenoses, the prev right CAE & patch angioplasty were patent  . Choledocholithiasis    12/11/14 CT--nonobstructive (67mm x 6 mm)  . Chronic renal insufficiency, stage IV (severe) (HCC)    Dr. Marval Regal  . Dementia with behavioral disturbance    depakote helpful  . Diaphragmatic hernia without mention of obstruction or gangrene   . Essential hypertension   . Hyperkalemia 06/12/2015   diminished renal excretion: as of 05/2016 pt on low K diet, qod kayexolate 15g, and 20mg  lasix qd  . Impaired mobility    uses walker, wheelchair  . Pernicious anemia   . Pure hypercholesterolemia   . Retinal hemorrhage of right eye   . Unspecified cerebral artery occlusion with cerebral infarction    MRI  Brain 12/12 showed large remote right hemispheric infarct w/ encephalomalacia w/ prominent small vessel dis superimposed on atrophy... (residual deficits are diminished sensation on left side, left hand grip weakness,   . Unspecified disorder resulting from impaired renal function    hyperkalemia and anemia  . Unspecified hearing loss   . Unspecified venous (peripheral) insufficiency   . Unspecified vitamin D deficiency     Past Surgical History:  Procedure Laterality Date  . CAROTID ENDARTERECTOMY    . CHOLECYSTECTOMY    . ENDOSCOPIC RETROGRADE CHOLANGIOPANCREATOGRAPHY (ERCP) WITH PROPOFOL N/A 03/09/2015   Procedure: ENDOSCOPIC RETROGRADE CHOLANGIOPANCREATOGRAPHY (ERCP) WITH PROPOFOL;  Surgeon: Milus Banister, MD;  Location: [facility];  Service: Endoscopy;  Laterality: N/A;  . laser surgery on right eye  12/02/2012   Dr. [facility]    Outpatient Medications Prior to Visit  Medication Sig Dispense Refill  . acetaminophen (TYLENOL) 500 MG tablet Take 1,000 mg by mouth at bedtime.     . cetirizine (ZYRTEC) 10 MG tablet Take 10 mg by mouth daily.    . clopidogrel (PLAVIX) 75 MG tablet Take 1 tablet (75 mg total) by mouth daily. RESTART THIS IN 3 DAYS 30 tablet 3  . cyanocobalamin (,VITAMIN B-12,) 1000 MCG/ML injection Inject 1,000 mcg into the skin every 30 (thirty) days. On the 12th of every month.    . divalproex (DEPAKOTE SPRINKLE) 125 MG capsule TAKE 1 CAPSULE TWICE DAILY (  Patient taking differently: Take 125 mg by mouth 2 (two) times daily. ) 60 capsule 5  . furosemide (LASIX) 20 MG tablet Take 1 tablet (20 mg total) by mouth daily. 30 tablet 6  . loperamide (IMODIUM A-D) 2 MG tablet Take 4 mg by mouth daily.    . Multiple Vitamin (MULTIVITAMIN) capsule Take 1 capsule by mouth daily.      Marland Kitchen oxybutynin (DITROPAN) 5 MG tablet Take 5 mg by mouth daily.    . ranitidine (ZANTAC) 75 MG tablet Take 75 mg by mouth daily.    . sodium chloride (OCEAN) 0.65 % nasal spray Place  2 sprays into the nose daily as needed for congestion. 1 spray in each nostril    . sodium polystyrene (KAYEXALATE) powder 15 g po x 1 dose 15 g 0  . urea (CARMOL) 20 % cream Apply topically as needed. Reported on 10/26/2015    . vitamin C (ASCORBIC ACID) 500 MG tablet Take 500 mg by mouth daily.     Marland Kitchen ENSURE PLUS (ENSURE PLUS) LIQD Take 237 mLs by mouth daily.    Marland Kitchen loratadine (CLARITIN) 10 MG tablet Take 10 mg by mouth at bedtime as needed for allergies.     No facility-administered medications prior to visit.     Allergies  Allergen Reactions  . Procaine Hcl Other (See Comments)    REACTION: pt states reaction to shot at dentist office w/ tachycardia \\T \ SOB (? if really from combination w/epi)    ROS As per HPI  PE: Blood pressure (!) 217/74, pulse (!) 53, temperature 98.2 F (36.8 C), temperature source Oral, resp. rate 16, height 5\' 4"  (1.626 m), weight 113 lb 8 oz (51.5 kg), SpO2 97 %. Gen: Alert, well appearing.  Patient is oriented to person and situation. AFFECT: pleasant, lucid speech. CV: RRR (rate 60), soft systolic murmur.  No diastolic murmur.  No r/g. EXT: no clubbing or cyanosis.  She has 2+ pitting edema bilat LL's. Right 5th toe has a small corn, which I shave today with dermoblade after applying betadine to the area.   LABS:  Lab Results  Component Value Date   TSH 1.22 09/29/2014   Lab Results  Component Value Date   WBC 4.0 04/30/2016   HGB 10.1 (L) 05/28/2016   HCT 31.2 (L) 04/30/2016   MCV 94.8 04/30/2016   PLT 162 04/30/2016   Lab Results  Component Value Date   CREATININE 1.68 (H) 04/30/2016   BUN 45 (H) 04/30/2016   NA 143 04/30/2016   K 5.2 (H) 04/30/2016   CL 111 04/30/2016   CO2 26 04/30/2016   Lab Results  Component Value Date   ALT 7 10/26/2015   AST 14 10/26/2015   ALKPHOS 81 10/26/2015   BILITOT 0.4 10/26/2015   Lab Results  Component Value Date   CHOL 133 12/02/2012   Lab Results  Component Value Date   HDL 42.60  12/02/2012   Lab Results  Component Value Date   LDLCALC 64 12/02/2012   Lab Results  Component Value Date   TRIG 132.0 12/02/2012   Lab Results  Component Value Date   CHOLHDL 3 12/02/2012   IMPRESSION AND PLAN:  1) HTN; bp's at ALF normal but systolic bp very high here today. Will add hydralazine 25mg  tid today.  2) Urge incontinence; continue oxybutynin and adult diapers.  3) Dementia with hx of behavioral disturbance: The current medical regimen is effective;  continue present plan and medications.  4) CRI  stage 4: lytes/cr today to monitor cr as well as hx of hyperkalemia. Will also check CBC to f/u hx of anemia of CRI.  An After Visit Summary was printed and given to the patient.  FOLLOW UP: 1week  Signed:  Crissie Sickles, MD           06/29/2016

## 2016-06-25 NOTE — Progress Notes (Signed)
Pre visit review using our clinic review tool, if applicable. No additional management support is needed unless otherwise documented below in the visit note. 

## 2016-06-26 ENCOUNTER — Encounter: Payer: Self-pay | Admitting: *Deleted

## 2016-06-26 ENCOUNTER — Other Ambulatory Visit: Payer: Self-pay | Admitting: Family Medicine

## 2016-06-26 MED ORDER — SODIUM POLYSTYRENE SULFONATE PO POWD: ORAL | 3 refills | Status: DC

## 2016-06-27 ENCOUNTER — Encounter (HOSPITAL_COMMUNITY)
Admission: RE | Admit: 2016-06-27 | Discharge: 2016-06-27 | Disposition: A | Discharge status: Home / Self Care | Payer: Medicare Other | Source: Ambulatory Visit | Attending: Nephrology | Admitting: Nephrology

## 2016-06-27 DIAGNOSIS — D631 Anemia in chronic kidney disease: Secondary | ICD-10-CM | POA: Diagnosis not present

## 2016-06-27 DIAGNOSIS — N184 Chronic kidney disease, stage 4 (severe): Secondary | ICD-10-CM | POA: Diagnosis not present

## 2016-06-27 DIAGNOSIS — Z5181 Encounter for therapeutic drug level monitoring: Secondary | ICD-10-CM | POA: Insufficient documentation

## 2016-06-27 DIAGNOSIS — Z79899 Other long term (current) drug therapy: Secondary | ICD-10-CM | POA: Diagnosis not present

## 2016-06-27 LAB — IRON AND TIBC
Iron: 77 ug/dL (ref 28–170)
SATURATION RATIOS: 30 % (ref 10.4–31.8)
TIBC: 255 ug/dL (ref 250–450)
UIBC: 178 ug/dL

## 2016-06-27 LAB — RENAL FUNCTION PANEL
ALBUMIN: 3.8 g/dL (ref 3.5–5.0)
Anion gap: 7 (ref 5–15)
BUN: 56 mg/dL — AB (ref 6–20)
CALCIUM: 9.4 mg/dL (ref 8.9–10.3)
CO2: 24 mmol/L (ref 22–32)
CREATININE: 2.01 mg/dL — AB (ref 0.44–1.00)
Chloride: 112 mmol/L — ABNORMAL HIGH (ref 101–111)
GFR, EST AFRICAN AMERICAN: 23 mL/min — AB (ref >60)
GFR, EST NON AFRICAN AMERICAN: 20 mL/min — AB (ref >60)
Glucose, Bld: 124 mg/dL — ABNORMAL HIGH (ref 65–99)
Phosphorus: 3.7 mg/dL (ref 2.5–4.6)
Potassium: 4.8 mmol/L (ref 3.5–5.1)
SODIUM: 143 mmol/L (ref 135–145)

## 2016-06-27 LAB — POCT HEMOGLOBIN-HEMACUE: HEMOGLOBIN: 10.5 g/dL — AB (ref 12.0–15.0)

## 2016-06-27 LAB — FERRITIN: Ferritin: 194 ng/mL (ref 11–307)

## 2016-06-27 MED ORDER — DARBEPOETIN ALFA 100 MCG/0.5ML IJ SOSY
PREFILLED_SYRINGE | INTRAMUSCULAR | Status: AC
  Filled 2016-06-27: qty 0.5

## 2016-06-27 MED ORDER — DARBEPOETIN ALFA 100 MCG/0.5ML IJ SOSY
100.0000 ug | PREFILLED_SYRINGE | INTRAMUSCULAR | Status: DC
  Administered 2016-06-27: 12:00:00 100 ug via SUBCUTANEOUS

## 2016-07-01 ENCOUNTER — Telehealth: Payer: Self-pay | Admitting: Family Medicine

## 2016-07-01 NOTE — Telephone Encounter (Signed)
Returned phone call and left message for patients care taker to return call.

## 2016-07-01 NOTE — Telephone Encounter (Signed)
She is returning your call from last week. She apologized that it has taken this long to return your call but she has been sick.

## 2016-07-05 NOTE — Telephone Encounter (Signed)
Patient's caretaker notified of lab results.

## 2016-07-10 ENCOUNTER — Ambulatory Visit: Payer: Medicare Other | Admitting: Podiatry

## 2016-07-10 NOTE — Progress Notes (Signed)
OFFICE VISIT  07/11/2016   CC:  Chief Complaint  Patient presents with  . Follow-up    HTN   HPI:    Patient is a 83 y.o. Caucasian female who presents for 2 week f/u HTN, CRI stage 4, and hx of hyperkalemia requiring chronic kayexalate administration. Last visit I added hydralazine 25mg  tid b/c her systolic bp in office was over 200. Review of bp's at ALF are 112-150 over 66-94.    Additionally, her potassium was elevated to 6 so I increased her kayexalate to 15mg  bid on M/W/F. Repeat K on 06/27/16 was 4.8.  She says she is eating a low K diet at her ALF.  Past Medical History:  Diagnosis Date  . Allergic rhinitis, cause unspecified   . Anemia of chronic kidney failure, stage 4 (severe) (HCC)    Aranesp via nephrologist  . Cataracts, bilateral   . Cerebrovascular disease, unspecified    CDopplers 1/13 showed mild mixed plaque w/ 40-59% (low end) bilat ICA stenoses, the prev right CAE & patch angioplasty were patent  . Choledocholithiasis    12/11/14 CT--nonobstructive (78mm x 6 mm)  . Chronic renal insufficiency, stage IV (severe) (HCC)    Dr. Marval Regal  . Dementia with behavioral disturbance    depakote helpful  . Diaphragmatic hernia without mention of obstruction or gangrene   . Essential hypertension   . Hyperkalemia 06/12/2015   diminished renal excretion: as of 05/2016 pt on low K diet, qod kayexolate 15g, and 20mg  lasix qd  . Impaired mobility    uses walker, wheelchair  . Pernicious anemia   . Pure hypercholesterolemia   . Retinal hemorrhage of right eye   . Unspecified cerebral artery occlusion with cerebral infarction    MRI Brain 12/12 showed large remote right hemispheric infarct w/ encephalomalacia w/ prominent small vessel dis superimposed on atrophy... (residual deficits are diminished sensation on left side, left hand grip weakness,   . Unspecified disorder resulting from impaired renal function    hyperkalemia and anemia  . Unspecified hearing loss   .  Unspecified venous (peripheral) insufficiency   . Unspecified vitamin D deficiency     Past Surgical History:  Procedure Laterality Date  . CAROTID ENDARTERECTOMY    . CHOLECYSTECTOMY    . ENDOSCOPIC RETROGRADE CHOLANGIOPANCREATOGRAPHY (ERCP) WITH PROPOFOL N/A 03/09/2015   Procedure: ENDOSCOPIC RETROGRADE CHOLANGIOPANCREATOGRAPHY (ERCP) WITH PROPOFOL;  Surgeon: Milus Banister, MD;  Location: [facility];  Service: Endoscopy;  Laterality: N/A;  . laser surgery on right eye  12/02/2012   Dr. [facility]    Outpatient Medications Prior to Visit  Medication Sig Dispense Refill  . acetaminophen (TYLENOL) 500 MG tablet Take 1,000 mg by mouth at bedtime.     . cetirizine (ZYRTEC) 10 MG tablet Take 10 mg by mouth daily.    . clopidogrel (PLAVIX) 75 MG tablet Take 1 tablet (75 mg total) by mouth daily. RESTART THIS IN 3 DAYS 30 tablet 3  . cyanocobalamin (,VITAMIN B-12,) 1000 MCG/ML injection Inject 1,000 mcg into the skin every 30 (thirty) days. On the 12th of every month.    . divalproex (DEPAKOTE SPRINKLE) 125 MG capsule TAKE 1 CAPSULE TWICE DAILY (Patient taking differently: Take 125 mg by mouth 2 (two) times daily. ) 60 capsule 5  . furosemide (LASIX) 20 MG tablet Take 1 tablet (20 mg total) by mouth daily. 30 tablet 6  . hydrALAZINE (APRESOLINE) 25 MG tablet Take 1 tablet (25 mg total) by mouth 3 (three)  times daily. 90 tablet 0  . loperamide (IMODIUM A-D) 2 MG tablet Take 4 mg by mouth daily.    . Multiple Vitamin (MULTIVITAMIN) capsule Take 1 capsule by mouth daily.      Marland Kitchen oxybutynin (DITROPAN) 5 MG tablet Take 5 mg by mouth daily.    . ranitidine (ZANTAC) 75 MG tablet Take 75 mg by mouth daily.    . sodium polystyrene (KAYEXALATE) powder 15 g po bid on mondays, wednesdays, and fridays 454 g 3  . urea (CARMOL) 20 % cream Apply topically as needed. Reported on 10/26/2015    . vitamin C (ASCORBIC ACID) 500 MG tablet Take 500 mg by mouth daily.     Marland Kitchen ENSURE PLUS (ENSURE PLUS)  LIQD Take 237 mLs by mouth daily.    Marland Kitchen loratadine (CLARITIN) 10 MG tablet Take 10 mg by mouth at bedtime as needed for allergies.    . sodium chloride (OCEAN) 0.65 % nasal spray Place 2 sprays into the nose daily as needed for congestion. 1 spray in each nostril     No facility-administered medications prior to visit.     Allergies  Allergen Reactions  . Procaine Hcl Other (See Comments)    REACTION: pt states reaction to shot at dentist office w/ tachycardia \\T \ SOB (? if really from combination w/epi)    ROS As per HPI  PE: Blood pressure 122/74, pulse (!) 58, temperature 98.1 F (36.7 C), temperature source Oral, resp. rate 16, height 5\' 4"  (1.626 m), weight 115 lb 12 oz (52.5 kg), SpO2 96 %. Gen: alert, oriented to person and place.   CV: RRR, no m/r/g.   LUNGS: CTA bilat, nonlabored resps, good aeration in all lung fields.   LABS:  Lab Results  Component Value Date   TSH 1.22 09/29/2014   Lab Results  Component Value Date   WBC 5.0 06/25/2016   HGB 10.5 (L) 06/27/2016   HCT 33.3 (L) 06/25/2016   MCV 95.0 06/25/2016   PLT 153.0 06/25/2016   Lab Results  Component Value Date   CREATININE 2.01 (H) 06/27/2016   BUN 56 (H) 06/27/2016   NA 143 06/27/2016   K 4.8 06/27/2016   CL 112 (H) 06/27/2016   CO2 24 06/27/2016   Lab Results  Component Value Date   ALT 7 10/26/2015   AST 14 10/26/2015   ALKPHOS 81 10/26/2015   BILITOT 0.4 10/26/2015   Lab Results  Component Value Date   CHOL 133 12/02/2012   Lab Results  Component Value Date   HDL 42.60 12/02/2012   Lab Results  Component Value Date   LDLCALC 64 12/02/2012   Lab Results  Component Value Date   TRIG 132.0 12/02/2012   Lab Results  Component Value Date   CHOLHDL 3 12/02/2012   IMPRESSION AND PLAN:  1) HTN, now under control. Continue hydralazine 25mg  tid and continue bp checks at ALF.  2) Hyperkalemia secondary to CRI stage 4: recent recheck by renal MD was normal. Continue low K diet  and current kayexalate dosing (15mg  bid M/W/F.  An After Visit Summary was printed and given to the patient.  FOLLOW UP: Return in about 3 months (around 10/11/2016) for routine chronic illness f/u.  Signed:  Crissie Sickles, MD           07/11/2016

## 2016-07-11 ENCOUNTER — Encounter: Payer: Self-pay | Admitting: Family Medicine

## 2016-07-11 ENCOUNTER — Ambulatory Visit (INDEPENDENT_AMBULATORY_CARE_PROVIDER_SITE_OTHER): Payer: Medicare Other | Admitting: Family Medicine

## 2016-07-11 VITALS — BP 122/74 | HR 58 | Temp 98.1°F | Resp 16 | Ht 64.0 in | Wt 115.8 lb

## 2016-07-11 DIAGNOSIS — E875 Hyperkalemia: Secondary | ICD-10-CM

## 2016-07-11 DIAGNOSIS — I10 Essential (primary) hypertension: Secondary | ICD-10-CM | POA: Diagnosis not present

## 2016-07-11 NOTE — Progress Notes (Signed)
Pre visit review using our clinic review tool, if applicable. No additional management support is needed unless otherwise documented below in the visit note. 

## 2016-07-17 DIAGNOSIS — N183 Chronic kidney disease, stage 3 (moderate): Secondary | ICD-10-CM | POA: Diagnosis not present

## 2016-07-17 DIAGNOSIS — D510 Vitamin B12 deficiency anemia due to intrinsic factor deficiency: Secondary | ICD-10-CM | POA: Diagnosis not present

## 2016-07-17 DIAGNOSIS — Z8673 Personal history of transient ischemic attack (TIA), and cerebral infarction without residual deficits: Secondary | ICD-10-CM | POA: Diagnosis not present

## 2016-07-17 DIAGNOSIS — I872 Venous insufficiency (chronic) (peripheral): Secondary | ICD-10-CM | POA: Diagnosis not present

## 2016-07-17 DIAGNOSIS — Z7902 Long term (current) use of antithrombotics/antiplatelets: Secondary | ICD-10-CM | POA: Diagnosis not present

## 2016-07-17 DIAGNOSIS — I129 Hypertensive chronic kidney disease with stage 1 through stage 4 chronic kidney disease, or unspecified chronic kidney disease: Secondary | ICD-10-CM | POA: Diagnosis not present

## 2016-07-17 DIAGNOSIS — Z79899 Other long term (current) drug therapy: Secondary | ICD-10-CM | POA: Diagnosis not present

## 2016-07-18 ENCOUNTER — Ambulatory Visit (INDEPENDENT_AMBULATORY_CARE_PROVIDER_SITE_OTHER): Payer: Medicare Other | Admitting: Podiatry

## 2016-07-18 ENCOUNTER — Encounter: Payer: Self-pay | Admitting: Podiatry

## 2016-07-18 VITALS — Ht 64.0 in | Wt 115.0 lb

## 2016-07-18 DIAGNOSIS — M79676 Pain in unspecified toe(s): Secondary | ICD-10-CM

## 2016-07-18 DIAGNOSIS — B351 Tinea unguium: Secondary | ICD-10-CM | POA: Diagnosis not present

## 2016-07-18 NOTE — Progress Notes (Signed)
Patient ID: Carrie Grimes, female   DOB: 11-12-1920, 83 y.o.   MRN: [id number] Complaint:  Visit Type: Patient returns to my office for continued preventative foot care services. Complaint: Patient states" my nails have grown long and thick and become painful to walk and wear shoe. The patient presents for preventative foot care services. No changes to ROS  Podiatric Exam: Vascular: dorsalis pedis and posterior tibial pulses are not palpable bilateral. Capillary return is immediate. Cold feet noted. Sensorium: Normal Semmes Weinstein monofilament test. Normal tactile sensation bilaterally. Nail Exam: Pt has thick disfigured discolored nails with subungual debris noted bilateral entire nail hallux through fifth toenails Ulcer Exam: There is no evidence of ulcer or pre-ulcerative changes or infection. Orthopedic Exam: Muscle tone and strength are WNL. No limitations in general ROM. No crepitus or effusions noted. Foot type and digits show no abnormalities. Bony prominences are unremarkable. Skin: No Porokeratosis. No infection or ulcers  Diagnosis:  Onychomycosis, , Pain in right toe, pain in left toes  Treatment & Plan Procedures and Treatment: Consent by patient was obtained for treatment procedures. The patient understood the discussion of treatment and procedures well. All questions were answered thoroughly reviewed. Debridement of mycotic and hypertrophic toenails, 1 through 5 bilateral and clearing of subungual debris. No ulceration, no infection noted.  Return Visit-Office Procedure: Patient instructed to return to the office for a follow up visit 3 months for continued evaluation and treatment.  Gardiner Barefoot DPM

## 2016-07-19 DIAGNOSIS — I10 Essential (primary) hypertension: Secondary | ICD-10-CM | POA: Diagnosis not present

## 2016-07-19 DIAGNOSIS — N184 Chronic kidney disease, stage 4 (severe): Secondary | ICD-10-CM | POA: Diagnosis not present

## 2016-07-19 DIAGNOSIS — E875 Hyperkalemia: Secondary | ICD-10-CM | POA: Diagnosis not present

## 2016-07-25 ENCOUNTER — Encounter (HOSPITAL_COMMUNITY)
Admission: RE | Admit: 2016-07-25 | Discharge: 2016-07-25 | Disposition: A | Discharge status: Home / Self Care | Payer: Medicare Other | Source: Ambulatory Visit | Attending: Nephrology | Admitting: Nephrology

## 2016-07-25 DIAGNOSIS — N184 Chronic kidney disease, stage 4 (severe): Secondary | ICD-10-CM | POA: Diagnosis not present

## 2016-07-25 DIAGNOSIS — Z79899 Other long term (current) drug therapy: Secondary | ICD-10-CM | POA: Diagnosis not present

## 2016-07-25 DIAGNOSIS — D631 Anemia in chronic kidney disease: Secondary | ICD-10-CM | POA: Diagnosis not present

## 2016-07-25 DIAGNOSIS — Z5181 Encounter for therapeutic drug level monitoring: Secondary | ICD-10-CM | POA: Diagnosis not present

## 2016-07-25 LAB — CBC
HCT: 31.8 % — ABNORMAL LOW (ref 36.0–46.0)
HEMOGLOBIN: 10.3 g/dL — AB (ref 12.0–15.0)
MCH: 31.5 pg (ref 26.0–34.0)
MCHC: 32.4 g/dL (ref 30.0–36.0)
MCV: 97.2 fL (ref 78.0–100.0)
Platelets: 156 10*3/uL (ref 150–400)
RBC: 3.27 MIL/uL — AB (ref 3.87–5.11)
RDW: 15.3 % (ref 11.5–15.5)
WBC: 5.4 10*3/uL (ref 4.0–10.5)

## 2016-07-25 MED ORDER — DARBEPOETIN ALFA 100 MCG/0.5ML IJ SOSY
100.0000 ug | PREFILLED_SYRINGE | INTRAMUSCULAR | Status: DC
  Administered 2016-07-25: 14:00:00 100 ug via SUBCUTANEOUS

## 2016-07-25 MED ORDER — DARBEPOETIN ALFA 100 MCG/0.5ML IJ SOSY
PREFILLED_SYRINGE | INTRAMUSCULAR | Status: DC
Start: 2016-07-25 — End: 2016-07-26
  Filled 2016-07-25: qty 0.5

## 2016-08-01 DIAGNOSIS — E875 Hyperkalemia: Secondary | ICD-10-CM

## 2016-08-01 DIAGNOSIS — I10 Essential (primary) hypertension: Secondary | ICD-10-CM

## 2016-08-19 DIAGNOSIS — Z79899 Other long term (current) drug therapy: Secondary | ICD-10-CM | POA: Diagnosis not present

## 2016-08-19 DIAGNOSIS — Z7902 Long term (current) use of antithrombotics/antiplatelets: Secondary | ICD-10-CM | POA: Diagnosis not present

## 2016-08-19 DIAGNOSIS — N183 Chronic kidney disease, stage 3 (moderate): Secondary | ICD-10-CM | POA: Diagnosis not present

## 2016-08-19 DIAGNOSIS — I129 Hypertensive chronic kidney disease with stage 1 through stage 4 chronic kidney disease, or unspecified chronic kidney disease: Secondary | ICD-10-CM | POA: Diagnosis not present

## 2016-08-19 DIAGNOSIS — Z8673 Personal history of transient ischemic attack (TIA), and cerebral infarction without residual deficits: Secondary | ICD-10-CM | POA: Diagnosis not present

## 2016-08-19 DIAGNOSIS — D510 Vitamin B12 deficiency anemia due to intrinsic factor deficiency: Secondary | ICD-10-CM | POA: Diagnosis not present

## 2016-08-19 DIAGNOSIS — I872 Venous insufficiency (chronic) (peripheral): Secondary | ICD-10-CM | POA: Diagnosis not present

## 2016-08-22 ENCOUNTER — Encounter (HOSPITAL_COMMUNITY)
Admission: RE | Admit: 2016-08-22 | Discharge: 2016-08-22 | Disposition: A | Discharge status: Home / Self Care | Payer: Medicare Other | Source: Ambulatory Visit | Attending: Nephrology | Admitting: Nephrology

## 2016-08-22 DIAGNOSIS — D631 Anemia in chronic kidney disease: Secondary | ICD-10-CM | POA: Insufficient documentation

## 2016-08-22 DIAGNOSIS — Z5181 Encounter for therapeutic drug level monitoring: Secondary | ICD-10-CM | POA: Insufficient documentation

## 2016-08-22 DIAGNOSIS — N184 Chronic kidney disease, stage 4 (severe): Secondary | ICD-10-CM | POA: Insufficient documentation

## 2016-08-22 DIAGNOSIS — Z79899 Other long term (current) drug therapy: Secondary | ICD-10-CM | POA: Diagnosis not present

## 2016-08-22 LAB — CBC
HCT: 33.9 % — ABNORMAL LOW (ref 36.0–46.0)
Hemoglobin: 11.1 g/dL — ABNORMAL LOW (ref 12.0–15.0)
MCH: 32.3 pg (ref 26.0–34.0)
MCHC: 32.7 g/dL (ref 30.0–36.0)
MCV: 98.5 fL (ref 78.0–100.0)
Platelets: 161 10*3/uL (ref 150–400)
RBC: 3.44 MIL/uL — ABNORMAL LOW (ref 3.87–5.11)
RDW: 14.9 % (ref 11.5–15.5)
WBC: 5.8 10*3/uL (ref 4.0–10.5)

## 2016-08-22 LAB — RENAL FUNCTION PANEL
ALBUMIN: 3.7 g/dL (ref 3.5–5.0)
Anion gap: 6 (ref 5–15)
BUN: 53 mg/dL — AB (ref 6–20)
CHLORIDE: 112 mmol/L — AB (ref 101–111)
CO2: 22 mmol/L (ref 22–32)
CREATININE: 1.82 mg/dL — AB (ref 0.44–1.00)
Calcium: 9.3 mg/dL (ref 8.9–10.3)
GFR calc Af Amer: 26 mL/min — ABNORMAL LOW (ref >60)
GFR, EST NON AFRICAN AMERICAN: 23 mL/min — AB (ref >60)
GLUCOSE: 116 mg/dL — AB (ref 65–99)
Phosphorus: 3.9 mg/dL (ref 2.5–4.6)
Potassium: 5 mmol/L (ref 3.5–5.1)
Sodium: 140 mmol/L (ref 135–145)

## 2016-08-22 LAB — IRON AND TIBC
Iron: 77 ug/dL (ref 28–170)
Saturation Ratios: 30 % (ref 10.4–31.8)
TIBC: 256 ug/dL (ref 250–450)
UIBC: 179 ug/dL

## 2016-08-22 LAB — FERRITIN: FERRITIN: 160 ng/mL (ref 11–307)

## 2016-08-22 MED ORDER — DARBEPOETIN ALFA 100 MCG/0.5ML IJ SOSY
100.0000 ug | PREFILLED_SYRINGE | INTRAMUSCULAR | Status: DC
  Administered 2016-08-22: 100 ug via SUBCUTANEOUS

## 2016-08-22 MED ORDER — DARBEPOETIN ALFA 100 MCG/0.5ML IJ SOSY
PREFILLED_SYRINGE | INTRAMUSCULAR | Status: AC
  Filled 2016-08-22: qty 0.5

## 2016-09-17 ENCOUNTER — Encounter (HOSPITAL_COMMUNITY)
Admission: RE | Admit: 2016-09-17 | Discharge: 2016-09-17 | Disposition: A | Discharge status: Home / Self Care | Payer: Medicare Other | Source: Ambulatory Visit | Attending: Nephrology | Admitting: Nephrology

## 2016-09-17 DIAGNOSIS — Z5181 Encounter for therapeutic drug level monitoring: Secondary | ICD-10-CM | POA: Diagnosis not present

## 2016-09-17 DIAGNOSIS — N183 Chronic kidney disease, stage 3 (moderate): Secondary | ICD-10-CM | POA: Diagnosis not present

## 2016-09-17 DIAGNOSIS — Z7902 Long term (current) use of antithrombotics/antiplatelets: Secondary | ICD-10-CM | POA: Diagnosis not present

## 2016-09-17 DIAGNOSIS — Z8673 Personal history of transient ischemic attack (TIA), and cerebral infarction without residual deficits: Secondary | ICD-10-CM | POA: Diagnosis not present

## 2016-09-17 DIAGNOSIS — D631 Anemia in chronic kidney disease: Secondary | ICD-10-CM | POA: Diagnosis not present

## 2016-09-17 DIAGNOSIS — D510 Vitamin B12 deficiency anemia due to intrinsic factor deficiency: Secondary | ICD-10-CM | POA: Diagnosis not present

## 2016-09-17 DIAGNOSIS — Z79899 Other long term (current) drug therapy: Secondary | ICD-10-CM | POA: Diagnosis not present

## 2016-09-17 DIAGNOSIS — I872 Venous insufficiency (chronic) (peripheral): Secondary | ICD-10-CM | POA: Diagnosis not present

## 2016-09-17 DIAGNOSIS — N184 Chronic kidney disease, stage 4 (severe): Secondary | ICD-10-CM | POA: Diagnosis not present

## 2016-09-17 DIAGNOSIS — I129 Hypertensive chronic kidney disease with stage 1 through stage 4 chronic kidney disease, or unspecified chronic kidney disease: Secondary | ICD-10-CM | POA: Diagnosis not present

## 2016-09-17 LAB — CBC
HEMATOCRIT: 32.5 % — AB (ref 36.0–46.0)
HEMOGLOBIN: 10.2 g/dL — AB (ref 12.0–15.0)
MCH: 31.9 pg (ref 26.0–34.0)
MCHC: 31.4 g/dL (ref 30.0–36.0)
MCV: 101.6 fL — AB (ref 78.0–100.0)
Platelets: 160 10*3/uL (ref 150–400)
RBC: 3.2 MIL/uL — ABNORMAL LOW (ref 3.87–5.11)
RDW: 14.3 % (ref 11.5–15.5)
WBC: 5 10*3/uL (ref 4.0–10.5)

## 2016-09-17 MED ORDER — DARBEPOETIN ALFA 100 MCG/0.5ML IJ SOSY
100.0000 ug | PREFILLED_SYRINGE | INTRAMUSCULAR | Status: DC
  Administered 2016-09-17: 100 ug via SUBCUTANEOUS

## 2016-09-24 DIAGNOSIS — F0390 Unspecified dementia without behavioral disturbance: Secondary | ICD-10-CM | POA: Diagnosis not present

## 2016-09-24 DIAGNOSIS — N184 Chronic kidney disease, stage 4 (severe): Secondary | ICD-10-CM | POA: Diagnosis not present

## 2016-09-24 DIAGNOSIS — N259 Disorder resulting from impaired renal tubular function, unspecified: Secondary | ICD-10-CM | POA: Diagnosis not present

## 2016-10-03 ENCOUNTER — Encounter: Payer: Self-pay | Admitting: Family Medicine

## 2016-10-03 ENCOUNTER — Ambulatory Visit (INDEPENDENT_AMBULATORY_CARE_PROVIDER_SITE_OTHER): Payer: Medicare Other | Admitting: Family Medicine

## 2016-10-03 ENCOUNTER — Encounter: Payer: Self-pay | Admitting: *Deleted

## 2016-10-03 VITALS — BP 170/64 | HR 60 | Temp 98.1°F | Resp 16 | Ht 64.0 in | Wt 125.8 lb

## 2016-10-03 DIAGNOSIS — F0281 Dementia in other diseases classified elsewhere with behavioral disturbance: Secondary | ICD-10-CM

## 2016-10-03 DIAGNOSIS — R600 Localized edema: Secondary | ICD-10-CM

## 2016-10-03 DIAGNOSIS — F02818 Dementia in other diseases classified elsewhere, unspecified severity, with other behavioral disturbance: Secondary | ICD-10-CM

## 2016-10-03 DIAGNOSIS — N184 Chronic kidney disease, stage 4 (severe): Secondary | ICD-10-CM | POA: Diagnosis not present

## 2016-10-03 DIAGNOSIS — Z8639 Personal history of other endocrine, nutritional and metabolic disease: Secondary | ICD-10-CM

## 2016-10-03 DIAGNOSIS — R609 Edema, unspecified: Secondary | ICD-10-CM | POA: Diagnosis not present

## 2016-10-03 DIAGNOSIS — G308 Other Alzheimer's disease: Secondary | ICD-10-CM

## 2016-10-03 DIAGNOSIS — I10 Essential (primary) hypertension: Secondary | ICD-10-CM | POA: Diagnosis not present

## 2016-10-03 MED ORDER — FUROSEMIDE 40 MG PO TABS: 40.0000 mg | ORAL_TABLET | Freq: Every day | ORAL | 0 refills | Status: DC

## 2016-10-03 NOTE — Patient Instructions (Signed)
Change furosemide (lasix) to 40 mg once daily (new rx eRx'd today). Continue to have her wear her compression stockings and elevate feet above the level of her heart for 20 min during daytime AS WELL AS when she sleeps.  Follow up in office in 1 week for recheck.

## 2016-10-03 NOTE — Progress Notes (Addendum)
OFFICE VISIT  10/03/2016   CC:  Chief Complaint  Patient presents with  . Follow-up    RCI,    HPI:    Patient is a 83 y.o. Caucasian female who presents for 3 mo f/u dementia with behavioral disturbance, CRI stage 4, HTN, hx of kyperkalemia due to diminished renal excretion, and chronic lower extremity venous insufficiency edema.  Presents primarily b/c LE swelling worse lately. She is by herself today so history is extremely limited. I started hydralazine several months ago so it is conceivable that the increased edema could be stemming from this.    HTN: review of BPs from ALF avg 140/70s. Reviewed most recent labs done by nephrology 08/22/16 and 09/17/16.  Renal fxn and potassium stable, as was hemoglobin/hct.  Dementia: no changes in memory or behavior.  Still taking depakote 125mg  qhs.  ROS: no SOB, CP, abd swelling, swelling of hands, or palpitations.  Past Medical History:  Diagnosis Date  . Allergic rhinitis, cause unspecified   . Anemia of chronic kidney failure, stage 4 (severe) (HCC)    Aranesp via nephrologist  . Cataracts, bilateral   . Cerebrovascular disease, unspecified    CDopplers 1/13 showed mild mixed plaque w/ 40-59% (low end) bilat ICA stenoses, the prev right CAE & patch angioplasty were patent  . Choledocholithiasis    12/11/14 CT--nonobstructive (79mm x 6 mm)  . Chronic renal insufficiency, stage IV (severe) (HCC)    Dr. Marval Regal  . Dementia with behavioral disturbance    depakote helpful  . Diaphragmatic hernia without mention of obstruction or gangrene   . Essential hypertension   . Hyperkalemia 06/12/2015   diminished renal excretion: as of 05/2016 pt on low K diet, qod kayexolate 15g, and 20mg  lasix qd  . Impaired mobility    uses walker, wheelchair  . Pernicious anemia   . Pure hypercholesterolemia   . Retinal hemorrhage of right eye   . Unspecified cerebral artery occlusion with cerebral infarction    MRI Brain 12/12 showed large remote right  hemispheric infarct w/ encephalomalacia w/ prominent small vessel dis superimposed on atrophy... (residual deficits are diminished sensation on left side, left hand grip weakness,   . Unspecified disorder resulting from impaired renal function    hyperkalemia and anemia  . Unspecified hearing loss   . Unspecified venous (peripheral) insufficiency   . Unspecified vitamin D deficiency     Past Surgical History:  Procedure Laterality Date  . CAROTID ENDARTERECTOMY    . CHOLECYSTECTOMY    . ENDOSCOPIC RETROGRADE CHOLANGIOPANCREATOGRAPHY (ERCP) WITH PROPOFOL N/A 03/09/2015   Procedure: ENDOSCOPIC RETROGRADE CHOLANGIOPANCREATOGRAPHY (ERCP) WITH PROPOFOL;  Surgeon: Milus Banister, MD;  Location: [facility];  Service: Endoscopy;  Laterality: N/A;  . laser surgery on right eye  12/02/2012   Dr. [facility]    Outpatient Medications Prior to Visit  Medication Sig Dispense Refill  . acetaminophen (TYLENOL) 500 MG tablet Take 1,000 mg by mouth at bedtime.     . cetirizine (ZYRTEC) 10 MG tablet Take 10 mg by mouth daily.    . clopidogrel (PLAVIX) 75 MG tablet Take 1 tablet (75 mg total) by mouth daily. RESTART THIS IN 3 DAYS 30 tablet 3  . cyanocobalamin (,VITAMIN B-12,) 1000 MCG/ML injection Inject 1,000 mcg into the skin every 30 (thirty) days. On the 12th of every month.    . divalproex (DEPAKOTE SPRINKLE) 125 MG capsule TAKE 1 CAPSULE TWICE DAILY (Patient taking differently: Take 125 mg by mouth 2 (two) times  daily. ) 60 capsule 5  . hydrALAZINE (APRESOLINE) 25 MG tablet Take 1 tablet (25 mg total) by mouth 3 (three) times daily. 90 tablet 0  . loperamide (IMODIUM A-D) 2 MG tablet Take 4 mg by mouth daily.    . Multiple Vitamin (MULTIVITAMIN) capsule Take 1 capsule by mouth daily.      Marland Kitchen oxybutynin (DITROPAN) 5 MG tablet Take 5 mg by mouth daily.    . ranitidine (ZANTAC) 75 MG tablet Take 75 mg by mouth daily.    . sodium polystyrene (KAYEXALATE) powder 15 g po bid on mondays,  wednesdays, and fridays 454 g 3  . urea (CARMOL) 20 % cream Apply topically as needed. Reported on 10/26/2015    . vitamin C (ASCORBIC ACID) 500 MG tablet Take 500 mg by mouth daily.     . furosemide (LASIX) 20 MG tablet Take 1 tablet (20 mg total) by mouth daily. 30 tablet 6   No facility-administered medications prior to visit.     Allergies  Allergen Reactions  . Procaine Hcl Other (See Comments)    REACTION: pt states reaction to shot at dentist office w/ tachycardia \\T \ SOB (? if really from combination w/epi)    ROS As per HPI  PE: Blood pressure (!) 170/64, pulse 60, temperature 98.1 F (36.7 C), temperature source Oral, resp. rate 16, height 5\' 4"  (1.626 m), weight 125 lb 12 oz (57 kg), SpO2 95 %. Gen: Alert, well appearing.  Patient is oriented to person, place, and situation. AFFECT: pleasantly demented. CV: RRR, faint systolic ejection murmur.  No r/g.   LUNGS: CTA bilat, nonlabored resps, good aeration in all lung fields. EXT: no clubbing or cyanosis.  She has >5+ pitting edema in both LL's from knees down into feet.  No skin breakdown or weeping.  Mild pinkish fibrotic changes to pretibial surfaces.  No erythema, no tenderness. No LL asymmetry.   LABS:    Chemistry      Component Value Date/Time   NA 140 08/22/2016 1301   K 5.0 08/22/2016 1301   CL 112 (H) 08/22/2016 1301   CO2 22 08/22/2016 1301   BUN 53 (H) 08/22/2016 1301   CREATININE 1.82 (H) 08/22/2016 1301   CREATININE 2.16 (H) 06/08/2015 1505      Component Value Date/Time   CALCIUM 9.3 08/22/2016 1301   ALKPHOS 81 10/26/2015 1102   AST 14 10/26/2015 1102   ALT 7 10/26/2015 1102   BILITOT 0.4 10/26/2015 1102     Lab Results  Component Value Date   CHOL 133 12/02/2012   HDL 42.60 12/02/2012   LDLCALC 64 12/02/2012   LDLDIRECT 74.7 01/29/2011   TRIG 132.0 12/02/2012   CHOLHDL 3 12/02/2012   Lab Results  Component Value Date   WBC 5.0 09/17/2016   HGB 10.2 (L) 09/17/2016   HCT 32.5 (L)  09/17/2016   MCV 101.6 (H) 09/17/2016   PLT 160 09/17/2016   IMPRESSION AND PLAN:  1) HTN: The current medical regimen is effective;  continue present plan and medications. Consider d/c hydralazine and change to alternative bp med IF the increased furosemide dose causes prerenal azotemia or if edema not significantly improved in 1 week.  2) CRI stage IV: keep approp f/u with renal MD.  Hx of hyperkalemia. Will recheck BMET at f/u in 1 wk.  3) Worsening LE pitting edema: secondary to renal insufficiency, venous insufficiency, and possibly relatively recent addition of hydralazine to med regimen. Increase lasix to 40mg  qd and recheck  in office with exam and BMET in 1 week.  4) Dementia with behav disturbance: stable.  The current medical regimen is effective;  continue present plan and medications.  An After Visit Summary was printed and given to the patient.  FOLLOW UP: Return in about 1 week (around 10/10/2016) for f/u edema/check BMET.  Signed:  Crissie Sickles, MD           10/03/2016

## 2016-10-10 ENCOUNTER — Ambulatory Visit (INDEPENDENT_AMBULATORY_CARE_PROVIDER_SITE_OTHER): Payer: Medicare Other | Admitting: Podiatry

## 2016-10-10 ENCOUNTER — Ambulatory Visit: Payer: Medicare Other | Admitting: Family Medicine

## 2016-10-10 ENCOUNTER — Encounter: Payer: Self-pay | Admitting: Family Medicine

## 2016-10-10 ENCOUNTER — Encounter: Payer: Self-pay | Admitting: Podiatry

## 2016-10-10 ENCOUNTER — Ambulatory Visit (INDEPENDENT_AMBULATORY_CARE_PROVIDER_SITE_OTHER): Payer: Medicare Other | Admitting: Family Medicine

## 2016-10-10 VITALS — BP 130/60 | HR 60 | Temp 98.7°F | Resp 16 | Ht 64.0 in | Wt 120.2 lb

## 2016-10-10 DIAGNOSIS — N184 Chronic kidney disease, stage 4 (severe): Secondary | ICD-10-CM | POA: Diagnosis not present

## 2016-10-10 DIAGNOSIS — I10 Essential (primary) hypertension: Secondary | ICD-10-CM | POA: Diagnosis not present

## 2016-10-10 DIAGNOSIS — I872 Venous insufficiency (chronic) (peripheral): Secondary | ICD-10-CM

## 2016-10-10 DIAGNOSIS — Z8639 Personal history of other endocrine, nutritional and metabolic disease: Secondary | ICD-10-CM

## 2016-10-10 DIAGNOSIS — M79676 Pain in unspecified toe(s): Secondary | ICD-10-CM

## 2016-10-10 DIAGNOSIS — R600 Localized edema: Secondary | ICD-10-CM

## 2016-10-10 DIAGNOSIS — B351 Tinea unguium: Secondary | ICD-10-CM | POA: Diagnosis not present

## 2016-10-10 NOTE — Progress Notes (Signed)
Patient ID: Carrie Grimes, female   DOB: Jan 27, 1921, 83 y.o.   MRN: [id number] Complaint:  Visit Type: Patient returns to my office for continued preventative foot care services. Complaint: Patient states" my nails have grown long and thick and become painful to walk and wear shoe. The patient presents for preventative foot care services. No changes to ROS  Podiatric Exam: Vascular: dorsalis pedis and posterior tibial pulses are not palpable bilateral. Capillary return is immediate. Cold feet noted. Sensorium: Normal Semmes Weinstein monofilament test. Normal tactile sensation bilaterally. Nail Exam: Pt has thick disfigured discolored nails with subungual debris noted bilateral entire nail hallux through fifth toenails Ulcer Exam: There is no evidence of ulcer or pre-ulcerative changes or infection. Orthopedic Exam: Muscle tone and strength are WNL. No limitations in general ROM. No crepitus or effusions noted. Foot type and digits show no abnormalities. Bony prominences are unremarkable. Skin: No Porokeratosis. No infection or ulcers  Diagnosis:  Onychomycosis, , Pain in right toe, pain in left toes  Treatment & Plan Procedures and Treatment: Consent by patient was obtained for treatment procedures. The patient understood the discussion of treatment and procedures well. All questions were answered thoroughly reviewed. Debridement of mycotic and hypertrophic toenails, 1 through 5 bilateral and clearing of subungual debris. No ulceration, no infection noted.  Patient right hallux has brittle hallux nail with necrotic tissue subungually. Return Visit-Office Procedure: Patient instructed to return to the office for a follow up visit 3 months for continued evaluation and treatment.  Gardiner Barefoot DPM

## 2016-10-10 NOTE — Progress Notes (Signed)
OFFICE VISIT  10/10/2016   CC:  Chief Complaint  Patient presents with  . Follow-up    edema and repeat BMP   HPI:    Patient is a 83 y.o. Caucasian female with advanced dementia who presents unaccompanied today for 1 wk f/u worsening of bilat LE venous insufficiency edema. Her edema was worse last visit 1 week ago (multifact: CRI stage 4, LE venous insuff, ? Hydralazine effect). I increased her lasix from 20mg  qd to 40mg  qd last visit. She says she feels like her swelling is improved some in legs.  She says she has been wearing compression stockings and elevating legs.    Review of BP's from ALF show avg 135/70s.   Past Medical History:  Diagnosis Date  . Allergic rhinitis, cause unspecified   . Anemia of chronic kidney failure, stage 4 (severe) (HCC)    Aranesp via nephrologist  . Cataracts, bilateral   . Cerebrovascular disease, unspecified    CDopplers 1/13 showed mild mixed plaque w/ 40-59% (low end) bilat ICA stenoses, the prev right CAE & patch angioplasty were patent  . Choledocholithiasis    12/11/14 CT--nonobstructive (4mm x 6 mm)  . Chronic renal insufficiency, stage IV (severe) (HCC)    Dr. Marval Regal  . Dementia with behavioral disturbance    depakote helpful  . Diaphragmatic hernia without mention of obstruction or gangrene   . Essential hypertension   . Hyperkalemia 06/12/2015   diminished renal excretion: as of 05/2016 pt on low K diet, qod kayexolate 15g, and 20mg  lasix qd  . Impaired mobility    uses walker, wheelchair  . Pernicious anemia   . Pure hypercholesterolemia   . Retinal hemorrhage of right eye   . Unspecified cerebral artery occlusion with cerebral infarction    MRI Brain 12/12 showed large remote right hemispheric infarct w/ encephalomalacia w/ prominent small vessel dis superimposed on atrophy... (residual deficits are diminished sensation on left side, left hand grip weakness,   . Unspecified disorder resulting from impaired renal function    hyperkalemia and anemia  . Unspecified hearing loss   . Unspecified venous (peripheral) insufficiency   . Unspecified vitamin D deficiency     Past Surgical History:  Procedure Laterality Date  . CAROTID ENDARTERECTOMY    . CHOLECYSTECTOMY    . ENDOSCOPIC RETROGRADE CHOLANGIOPANCREATOGRAPHY (ERCP) WITH PROPOFOL N/A 03/09/2015   Procedure: ENDOSCOPIC RETROGRADE CHOLANGIOPANCREATOGRAPHY (ERCP) WITH PROPOFOL;  Surgeon: Milus Banister, MD;  Location: [facility];  Service: Endoscopy;  Laterality: N/A;  . laser surgery on right eye  12/02/2012   Dr. [facility]    Outpatient Medications Prior to Visit  Medication Sig Dispense Refill  . acetaminophen (TYLENOL) 500 MG tablet Take 1,000 mg by mouth at bedtime.     . cetirizine (ZYRTEC) 10 MG tablet Take 10 mg by mouth daily.    . clopidogrel (PLAVIX) 75 MG tablet Take 1 tablet (75 mg total) by mouth daily. RESTART THIS IN 3 DAYS 30 tablet 3  . cyanocobalamin (,VITAMIN B-12,) 1000 MCG/ML injection Inject 1,000 mcg into the skin every 30 (thirty) days. On the 12th of every month.    . divalproex (DEPAKOTE SPRINKLE) 125 MG capsule TAKE 1 CAPSULE TWICE DAILY (Patient taking differently: Take 125 mg by mouth 2 (two) times daily. ) 60 capsule 5  . hydrALAZINE (APRESOLINE) 25 MG tablet Take 1 tablet (25 mg total) by mouth 3 (three) times daily. 90 tablet 0  . loperamide (IMODIUM A-D) 2 MG tablet Take  4 mg by mouth daily.    . Multiple Vitamin (MULTIVITAMIN) capsule Take 1 capsule by mouth daily.      Marland Kitchen oxybutynin (DITROPAN) 5 MG tablet Take 5 mg by mouth daily.    . ranitidine (ZANTAC) 75 MG tablet Take 75 mg by mouth daily.    . sodium polystyrene (KAYEXALATE) powder 15 g po bid on mondays, wednesdays, and fridays 454 g 3  . urea (CARMOL) 20 % cream Apply topically as needed. Reported on 10/26/2015    . vitamin C (ASCORBIC ACID) 500 MG tablet Take 500 mg by mouth daily.     . furosemide (LASIX) 40 MG tablet Take 1 tablet (40 mg total)  by mouth daily. (Patient not taking: Reported on 10/10/2016) 30 tablet 0   No facility-administered medications prior to visit.     Allergies  Allergen Reactions  . Procaine Hcl Other (See Comments)    REACTION: pt states reaction to shot at dentist office w/ tachycardia \\T \ SOB (? if really from combination w/epi)    ROS As per HPI  PE: Blood pressure 130/60, pulse 60, temperature 98.7 F (37.1 C), temperature source Oral, resp. rate 16, height 5\' 4"  (1.626 m), weight 120 lb 4 oz (54.5 kg), SpO2 96 %. Gen: Alert, well appearing.  Patient is oriented to person, general place, general situation. Pleasantly demented, very HOH. XVQ:MGQQ: no injection, icteris, swelling, or exudate.  EOMI, PERRLA. Mouth: lips without lesion/swelling.  Oral mucosa pink and moist. Oropharynx without erythema, exudate, or swelling.  CV: RRR, 2/6 systolic murmur, no rub/gallop.   Chest is clear, no wheezing or rales. Normal symmetric air entry throughout both lung fields. No chest wall deformities or tenderness. EXT: 4+ pitting edema in both LL's from below knees down into feet--pretty symmetric.  No skin breakdown or erythema.   LABS:    Chemistry      Component Value Date/Time   NA 140 08/22/2016 1301   K 5.0 08/22/2016 1301   CL 112 (H) 08/22/2016 1301   CO2 22 08/22/2016 1301   BUN 53 (H) 08/22/2016 1301   CREATININE 1.82 (H) 08/22/2016 1301   CREATININE 2.16 (H) 06/08/2015 1505      Component Value Date/Time   CALCIUM 9.3 08/22/2016 1301   ALKPHOS 81 10/26/2015 1102   AST 14 10/26/2015 1102   ALT 7 10/26/2015 1102   BILITOT 0.4 10/26/2015 1102      IMPRESSION AND PLAN:  1) Severe bilat LE edema, multifactorial, (LE venous insufficiency, CRI stage 4, hydralazine side effect)--slightly improved but still very significant. CMET today to monitor lytes/cr/protein/albumin.  If Cr stable then will increase lasix to 40 qAM and 20 qPM. If cr bumps, need to likely lower hydralazine dose, possibly  add low dose clonidine, and decrease lasix back to 20mg  qd. Continue hose, elevation of legs.  Totally unknown whether or not she is getting low Na and low K diet at her ALF.  2) HTN: The current medical regimen is effective;  continue present plan and medications. We are significantly limited in bp med options due to her CRI with hx of hyperkalemia and her borderline low resting HR.  An After Visit Summary was printed and given to the patient.  FOLLOW UP: Return in about 2 weeks (around 10/24/2016) for f/u edema/bp/BMET.  Signed:  Crissie Sickles, MD           10/10/2016

## 2016-10-15 ENCOUNTER — Encounter (HOSPITAL_COMMUNITY)
Admission: RE | Admit: 2016-10-15 | Discharge: 2016-10-15 | Disposition: A | Discharge status: Home / Self Care | Payer: Medicare Other | Source: Ambulatory Visit | Attending: Nephrology | Admitting: Nephrology

## 2016-10-15 DIAGNOSIS — D631 Anemia in chronic kidney disease: Secondary | ICD-10-CM | POA: Diagnosis not present

## 2016-10-15 DIAGNOSIS — Z79899 Other long term (current) drug therapy: Secondary | ICD-10-CM | POA: Diagnosis not present

## 2016-10-15 DIAGNOSIS — N184 Chronic kidney disease, stage 4 (severe): Secondary | ICD-10-CM | POA: Diagnosis not present

## 2016-10-15 DIAGNOSIS — Z5181 Encounter for therapeutic drug level monitoring: Secondary | ICD-10-CM | POA: Insufficient documentation

## 2016-10-15 LAB — IRON AND TIBC
IRON: 66 ug/dL (ref 28–170)
Saturation Ratios: 28 % (ref 10.4–31.8)
TIBC: 239 ug/dL — ABNORMAL LOW (ref 250–450)
UIBC: 173 ug/dL

## 2016-10-15 LAB — CBC
HEMATOCRIT: 31 % — AB (ref 36.0–46.0)
HEMOGLOBIN: 9.6 g/dL — AB (ref 12.0–15.0)
MCH: 31.7 pg (ref 26.0–34.0)
MCHC: 31 g/dL (ref 30.0–36.0)
MCV: 102.3 fL — ABNORMAL HIGH (ref 78.0–100.0)
Platelets: 169 10*3/uL (ref 150–400)
RBC: 3.03 MIL/uL — AB (ref 3.87–5.11)
RDW: 13.3 % (ref 11.5–15.5)
WBC: 5.3 10*3/uL (ref 4.0–10.5)

## 2016-10-15 LAB — RENAL FUNCTION PANEL
ALBUMIN: 3.2 g/dL — AB (ref 3.5–5.0)
ANION GAP: 6 (ref 5–15)
BUN: 57 mg/dL — ABNORMAL HIGH (ref 6–20)
CALCIUM: 9.1 mg/dL (ref 8.9–10.3)
CO2: 22 mmol/L (ref 22–32)
Chloride: 114 mmol/L — ABNORMAL HIGH (ref 101–111)
Creatinine, Ser: 1.99 mg/dL — ABNORMAL HIGH (ref 0.44–1.00)
GFR calc non Af Amer: 20 mL/min — ABNORMAL LOW (ref >60)
GFR, EST AFRICAN AMERICAN: 24 mL/min — AB (ref >60)
Glucose, Bld: 135 mg/dL — ABNORMAL HIGH (ref 65–99)
PHOSPHORUS: 3.5 mg/dL (ref 2.5–4.6)
Potassium: 5.5 mmol/L — ABNORMAL HIGH (ref 3.5–5.1)
SODIUM: 142 mmol/L (ref 135–145)

## 2016-10-15 LAB — FERRITIN: Ferritin: 144 ng/mL (ref 11–307)

## 2016-10-15 MED ORDER — DARBEPOETIN ALFA 100 MCG/0.5ML IJ SOSY
PREFILLED_SYRINGE | INTRAMUSCULAR | Status: AC
  Filled 2016-10-15: qty 0.5

## 2016-10-15 MED ORDER — DARBEPOETIN ALFA 100 MCG/0.5ML IJ SOSY
100.0000 ug | PREFILLED_SYRINGE | INTRAMUSCULAR | Status: DC
  Administered 2016-10-15: 12:00:00 100 ug via SUBCUTANEOUS

## 2016-10-17 DIAGNOSIS — Z8673 Personal history of transient ischemic attack (TIA), and cerebral infarction without residual deficits: Secondary | ICD-10-CM | POA: Diagnosis not present

## 2016-10-17 DIAGNOSIS — D510 Vitamin B12 deficiency anemia due to intrinsic factor deficiency: Secondary | ICD-10-CM | POA: Diagnosis not present

## 2016-10-17 DIAGNOSIS — Z7902 Long term (current) use of antithrombotics/antiplatelets: Secondary | ICD-10-CM | POA: Diagnosis not present

## 2016-10-17 DIAGNOSIS — I872 Venous insufficiency (chronic) (peripheral): Secondary | ICD-10-CM | POA: Diagnosis not present

## 2016-10-17 DIAGNOSIS — Z79899 Other long term (current) drug therapy: Secondary | ICD-10-CM | POA: Diagnosis not present

## 2016-10-17 DIAGNOSIS — I129 Hypertensive chronic kidney disease with stage 1 through stage 4 chronic kidney disease, or unspecified chronic kidney disease: Secondary | ICD-10-CM | POA: Diagnosis not present

## 2016-10-17 DIAGNOSIS — N183 Chronic kidney disease, stage 3 (moderate): Secondary | ICD-10-CM | POA: Diagnosis not present

## 2016-10-24 ENCOUNTER — Ambulatory Visit (INDEPENDENT_AMBULATORY_CARE_PROVIDER_SITE_OTHER): Payer: Medicare Other | Admitting: Family Medicine

## 2016-10-24 ENCOUNTER — Encounter: Payer: Self-pay | Admitting: Family Medicine

## 2016-10-24 VITALS — BP 122/55 | HR 55 | Temp 98.4°F | Resp 16 | Ht 64.0 in | Wt 121.2 lb

## 2016-10-24 DIAGNOSIS — N184 Chronic kidney disease, stage 4 (severe): Secondary | ICD-10-CM | POA: Diagnosis not present

## 2016-10-24 DIAGNOSIS — I872 Venous insufficiency (chronic) (peripheral): Secondary | ICD-10-CM | POA: Diagnosis not present

## 2016-10-24 DIAGNOSIS — T887XXA Unspecified adverse effect of drug or medicament, initial encounter: Secondary | ICD-10-CM

## 2016-10-24 DIAGNOSIS — E875 Hyperkalemia: Secondary | ICD-10-CM | POA: Diagnosis not present

## 2016-10-24 DIAGNOSIS — R600 Localized edema: Secondary | ICD-10-CM

## 2016-10-24 MED ORDER — DIVALPROEX SODIUM 125 MG PO CSDR: DELAYED_RELEASE_CAPSULE | ORAL | 0 refills | Status: AC

## 2016-10-24 MED ORDER — FUROSEMIDE 20 MG PO TABS: ORAL_TABLET | ORAL | 0 refills | Status: DC

## 2016-10-24 NOTE — Progress Notes (Signed)
OFFICE VISIT  10/24/2016   CC:  Chief Complaint  Patient presents with  . Follow-up    RCI   HPI:    Patient is a 83 y.o. Caucasian female with advanced dementia who presents unaccompanied today for f/u lower extremity edema, HTN.  I last saw her 2 weeks ago--we have been titrating her lasix up some, staying with same bp regimen. Was going to monitoring BMET today but this was recently done when pt went to hospital for aranesp injection on 10/15/16 (Dr. Daisy Lazar) for her anemia of CRI. K was 5.5 at that time.  I am unable to discern from the EMR whether or not any change in meds was made in response to this result.  The dosing is unchanged on her NH med list reviewed today.  It appears that her CMET did not get drawn last visit, so I couldn't assess renal function, so I did not change her lasix from the 40mg  qd dosing she was on at that time.    BP avg at NH per paperwork today is 150/75.  Past Medical History:  Diagnosis Date  . Allergic rhinitis, cause unspecified   . Anemia of chronic kidney failure, stage 4 (severe) (HCC)    Aranesp via nephrologist  . Cataracts, bilateral   . Cerebrovascular disease, unspecified    CDopplers 1/13 showed mild mixed plaque w/ 40-59% (low end) bilat ICA stenoses, the prev right CAE & patch angioplasty were patent  . Choledocholithiasis    12/11/14 CT--nonobstructive (11mm x 6 mm)  . Chronic renal insufficiency, stage IV (severe) (HCC)    Dr. Marval Regal  . Dementia with behavioral disturbance    depakote helpful  . Diaphragmatic hernia without mention of obstruction or gangrene   . Essential hypertension   . Hyperkalemia 06/12/2015   diminished renal excretion: as of 05/2016 pt on low K diet, qod kayexolate 15g, and 20mg  lasix qd  . Impaired mobility    uses walker, wheelchair  . Pernicious anemia   . Pure hypercholesterolemia   . Retinal hemorrhage of right eye   . Unspecified cerebral artery occlusion with cerebral infarction    MRI Brain 12/12 showed large remote right hemispheric infarct w/ encephalomalacia w/ prominent small vessel dis superimposed on atrophy... (residual deficits are diminished sensation on left side, left hand grip weakness,   . Unspecified disorder resulting from impaired renal function    hyperkalemia and anemia  . Unspecified hearing loss   . Unspecified venous (peripheral) insufficiency   . Unspecified vitamin D deficiency     Past Surgical History:  Procedure Laterality Date  . CAROTID ENDARTERECTOMY    . CHOLECYSTECTOMY    . ENDOSCOPIC RETROGRADE CHOLANGIOPANCREATOGRAPHY (ERCP) WITH PROPOFOL N/A 03/09/2015   Procedure: ENDOSCOPIC RETROGRADE CHOLANGIOPANCREATOGRAPHY (ERCP) WITH PROPOFOL;  Surgeon: Milus Banister, MD;  Location: [facility];  Service: Endoscopy;  Laterality: N/A;  . laser surgery on right eye  12/02/2012   Dr. [facility]    Outpatient Medications Prior to Visit  Medication Sig Dispense Refill  . acetaminophen (TYLENOL) 500 MG tablet Take 1,000 mg by mouth at bedtime.     . cetirizine (ZYRTEC) 10 MG tablet Take 10 mg by mouth daily.    . clopidogrel (PLAVIX) 75 MG tablet Take 1 tablet (75 mg total) by mouth daily. RESTART THIS IN 3 DAYS 30 tablet 3  . cyanocobalamin (,VITAMIN B-12,) 1000 MCG/ML injection Inject 1,000 mcg into the skin every 30 (thirty) days. On the 12th of every month.    Marland Kitchen  furosemide (LASIX) 40 MG tablet Take 1 tablet by mouth daily.    . hydrALAZINE (APRESOLINE) 25 MG tablet Take 1 tablet (25 mg total) by mouth 3 (three) times daily. 90 tablet 0  . loperamide (IMODIUM A-D) 2 MG tablet Take 4 mg by mouth daily.    . Multiple Vitamin (MULTIVITAMIN) capsule Take 1 capsule by mouth daily.      Marland Kitchen oxybutynin (DITROPAN) 5 MG tablet Take 5 mg by mouth daily.    . ranitidine (ZANTAC) 75 MG tablet Take 75 mg by mouth daily.    . sodium polystyrene (KAYEXALATE) powder 15 g po bid on mondays, wednesdays, and fridays 454 g 3  . urea (CARMOL) 20 %  cream Apply topically as needed. Reported on 10/26/2015    . vitamin C (ASCORBIC ACID) 500 MG tablet Take 500 mg by mouth daily.     . divalproex (DEPAKOTE SPRINKLE) 125 MG capsule TAKE 1 CAPSULE TWICE DAILY (Patient taking differently: Take 125 mg by mouth 2 (two) times daily. ) 60 capsule 5   No facility-administered medications prior to visit.     Allergies  Allergen Reactions  . Procaine Hcl Other (See Comments)    REACTION: pt states reaction to shot at dentist office w/ tachycardia \\T \ SOB (? if really from combination w/epi)    ROS As per HPI  PE: Blood pressure (!) 122/55, pulse (!) 55, temperature 98.4 F (36.9 C), temperature source Oral, resp. rate 16, height 5\' 4"  (1.626 m), weight 121 lb 4 oz (55 kg), SpO2 98 %. Gen: Alert, well appearing.  Patient is oriented to person and general situation.  "Pleasantly demented". CV: RRR, 7-8/2 systolic murmur, no rub or gallop.  NO ectopy. Chest is clear, no wheezing or rales. Normal symmetric air entry throughout both lung fields. No chest wall deformities or tenderness. EXT: 4+ bilat pitting edema w/out any weeping or LE erythema.  She has pink discoloration and fibrous skin changes in pretibial region bilat. No tenderness.  She is wearing compression hose.  LABS:    Chemistry      Component Value Date/Time   NA 142 10/15/2016 1112   K 5.5 (H) 10/15/2016 1112   CL 114 (H) 10/15/2016 1112   CO2 22 10/15/2016 1112   BUN 57 (H) 10/15/2016 1112   CREATININE 1.99 (H) 10/15/2016 1112   CREATININE 2.16 (H) 06/08/2015 1505      Component Value Date/Time   CALCIUM 9.1 10/15/2016 1112   ALKPHOS 81 10/26/2015 1102   AST 14 10/26/2015 1102   ALT 7 10/26/2015 1102   BILITOT 0.4 10/26/2015 1102     Lab Results  Component Value Date   WBC 5.3 10/15/2016   HGB 9.6 (L) 10/15/2016   HCT 31.0 (L) 10/15/2016   MCV 102.3 (H) 10/15/2016   PLT 169 10/15/2016   Lab Results  Component Value Date   IRON 66 10/15/2016   TIBC 239 (L)  10/15/2016   FERRITIN 144 10/15/2016   IMPRESSION AND PLAN:  1) Chronic LE edema, multifactorial (venous insufficiency, CRI stage 4, possible hydralazine side effect): no signif change, but would not expect any since lasix dosing was not changed after last visit like I had intended. Will increase today to 40 mg qAM with an additional 20mg  added every evening.  2) CRI stage 4: GFR stable on most recent labs done by nephrology.  3) Hx of hyperkalemia: most recent K 5.5.  No change in kayexolate at this time.  Hopefully we'll see K  drop some with the additional dose of lasix. Recheck BMET at next f/u in 2 wks.  No labs today.  An After Visit Summary was printed and given to the patient.  FOLLOW UP: Return in about 2 weeks (around 11/07/2016) for f/u edema/HTN/hyperkal.  Signed:  Crissie Sickles, MD           10/24/2016

## 2016-11-12 ENCOUNTER — Encounter (HOSPITAL_COMMUNITY)
Admission: RE | Admit: 2016-11-12 | Discharge: 2016-11-12 | Disposition: A | Discharge status: Home / Self Care | Payer: Medicare Other | Source: Ambulatory Visit | Attending: Nephrology | Admitting: Nephrology

## 2016-11-12 DIAGNOSIS — D631 Anemia in chronic kidney disease: Secondary | ICD-10-CM | POA: Insufficient documentation

## 2016-11-12 DIAGNOSIS — Z79899 Other long term (current) drug therapy: Secondary | ICD-10-CM | POA: Insufficient documentation

## 2016-11-12 DIAGNOSIS — Z5181 Encounter for therapeutic drug level monitoring: Secondary | ICD-10-CM | POA: Diagnosis not present

## 2016-11-12 DIAGNOSIS — N184 Chronic kidney disease, stage 4 (severe): Secondary | ICD-10-CM | POA: Diagnosis not present

## 2016-11-12 LAB — CBC
HCT: 32.7 % — ABNORMAL LOW (ref 36.0–46.0)
Hemoglobin: 10.3 g/dL — ABNORMAL LOW (ref 12.0–15.0)
MCH: 31.6 pg (ref 26.0–34.0)
MCHC: 31.5 g/dL (ref 30.0–36.0)
MCV: 100.3 fL — ABNORMAL HIGH (ref 78.0–100.0)
Platelets: 183 10*3/uL (ref 150–400)
RBC: 3.26 MIL/uL — ABNORMAL LOW (ref 3.87–5.11)
RDW: 12.9 % (ref 11.5–15.5)
WBC: 6.1 10*3/uL (ref 4.0–10.5)

## 2016-11-12 MED ORDER — DARBEPOETIN ALFA 100 MCG/0.5ML IJ SOSY
PREFILLED_SYRINGE | INTRAMUSCULAR | Status: AC
  Filled 2016-11-12: qty 0.5

## 2016-11-12 MED ORDER — DARBEPOETIN ALFA 100 MCG/0.5ML IJ SOSY
100.0000 ug | PREFILLED_SYRINGE | INTRAMUSCULAR | Status: DC
  Administered 2016-11-12: 100 ug via SUBCUTANEOUS

## 2016-11-13 DIAGNOSIS — Z8673 Personal history of transient ischemic attack (TIA), and cerebral infarction without residual deficits: Secondary | ICD-10-CM | POA: Diagnosis not present

## 2016-11-13 DIAGNOSIS — Z79899 Other long term (current) drug therapy: Secondary | ICD-10-CM | POA: Diagnosis not present

## 2016-11-13 DIAGNOSIS — I129 Hypertensive chronic kidney disease with stage 1 through stage 4 chronic kidney disease, or unspecified chronic kidney disease: Secondary | ICD-10-CM | POA: Diagnosis not present

## 2016-11-13 DIAGNOSIS — Z7902 Long term (current) use of antithrombotics/antiplatelets: Secondary | ICD-10-CM | POA: Diagnosis not present

## 2016-11-13 DIAGNOSIS — I872 Venous insufficiency (chronic) (peripheral): Secondary | ICD-10-CM | POA: Diagnosis not present

## 2016-11-13 DIAGNOSIS — N183 Chronic kidney disease, stage 3 (moderate): Secondary | ICD-10-CM | POA: Diagnosis not present

## 2016-11-13 DIAGNOSIS — D510 Vitamin B12 deficiency anemia due to intrinsic factor deficiency: Secondary | ICD-10-CM | POA: Diagnosis not present

## 2016-11-17 ENCOUNTER — Other Ambulatory Visit: Payer: Self-pay | Admitting: Family Medicine

## 2016-11-18 ENCOUNTER — Other Ambulatory Visit: Payer: Self-pay | Admitting: *Deleted

## 2016-11-18 MED ORDER — FUROSEMIDE 20 MG PO TABS: ORAL_TABLET | ORAL | 1 refills | Status: DC

## 2016-11-18 MED ORDER — FUROSEMIDE 40 MG PO TABS: 40.0000 mg | ORAL_TABLET | Freq: Every day | ORAL | 1 refills | Status: DC

## 2016-11-18 NOTE — Telephone Encounter (Signed)
Per last office note pt was to take 40mg  tablet in the am and 20mg  tablet in the pm. Rx's sent for #30 w/ 1RF.

## 2016-11-18 NOTE — Telephone Encounter (Signed)
[facility]  RF request for furosemide 40mg  LOV: 10/24/16 Next ov: None Last written: 10/03/16 #30 w/ 3KV  Should pt be taking the 40mg  or 20mg ? Both are listed in her chart.  Please advise. Thanks.

## 2016-11-18 NOTE — Telephone Encounter (Signed)
Caren Griffins from [facility] called stating that pt has only one tablet for furosemide for this morning then she will be out.

## 2016-11-29 DIAGNOSIS — I129 Hypertensive chronic kidney disease with stage 1 through stage 4 chronic kidney disease, or unspecified chronic kidney disease: Secondary | ICD-10-CM | POA: Diagnosis not present

## 2016-11-29 DIAGNOSIS — F0390 Unspecified dementia without behavioral disturbance: Secondary | ICD-10-CM | POA: Diagnosis not present

## 2016-11-29 DIAGNOSIS — I872 Venous insufficiency (chronic) (peripheral): Secondary | ICD-10-CM | POA: Diagnosis not present

## 2016-11-29 DIAGNOSIS — N183 Chronic kidney disease, stage 3 (moderate): Secondary | ICD-10-CM | POA: Diagnosis not present

## 2016-11-29 DIAGNOSIS — D510 Vitamin B12 deficiency anemia due to intrinsic factor deficiency: Secondary | ICD-10-CM | POA: Diagnosis not present

## 2016-11-29 DIAGNOSIS — Z8673 Personal history of transient ischemic attack (TIA), and cerebral infarction without residual deficits: Secondary | ICD-10-CM | POA: Diagnosis not present

## 2016-12-10 ENCOUNTER — Encounter (HOSPITAL_COMMUNITY)
Admission: RE | Admit: 2016-12-10 | Discharge: 2016-12-10 | Disposition: A | Discharge status: Home / Self Care | Payer: Medicare Other | Source: Ambulatory Visit | Attending: Nephrology | Admitting: Nephrology

## 2016-12-10 DIAGNOSIS — D631 Anemia in chronic kidney disease: Secondary | ICD-10-CM | POA: Insufficient documentation

## 2016-12-10 DIAGNOSIS — Z79899 Other long term (current) drug therapy: Secondary | ICD-10-CM | POA: Insufficient documentation

## 2016-12-10 DIAGNOSIS — N184 Chronic kidney disease, stage 4 (severe): Secondary | ICD-10-CM | POA: Insufficient documentation

## 2016-12-10 DIAGNOSIS — Z5181 Encounter for therapeutic drug level monitoring: Secondary | ICD-10-CM | POA: Diagnosis not present

## 2016-12-10 LAB — IRON AND TIBC
Iron: 66 ug/dL (ref 28–170)
Saturation Ratios: 24 % (ref 10.4–31.8)
TIBC: 272 ug/dL (ref 250–450)
UIBC: 206 ug/dL

## 2016-12-10 LAB — RENAL FUNCTION PANEL
ALBUMIN: 3.7 g/dL (ref 3.5–5.0)
Anion gap: 8 (ref 5–15)
BUN: 76 mg/dL — ABNORMAL HIGH (ref 6–20)
CALCIUM: 9 mg/dL (ref 8.9–10.3)
CO2: 24 mmol/L (ref 22–32)
CREATININE: 2.29 mg/dL — AB (ref 0.44–1.00)
Chloride: 111 mmol/L (ref 101–111)
GFR, EST AFRICAN AMERICAN: 20 mL/min — AB (ref >60)
GFR, EST NON AFRICAN AMERICAN: 17 mL/min — AB (ref >60)
Glucose, Bld: 124 mg/dL — ABNORMAL HIGH (ref 65–99)
PHOSPHORUS: 5 mg/dL — AB (ref 2.5–4.6)
Potassium: 4.7 mmol/L (ref 3.5–5.1)
SODIUM: 143 mmol/L (ref 135–145)

## 2016-12-10 LAB — FERRITIN: Ferritin: 151 ng/mL (ref 11–307)

## 2016-12-10 LAB — POCT HEMOGLOBIN-HEMACUE: HEMOGLOBIN: 10.3 g/dL — AB (ref 12.0–15.0)

## 2016-12-10 MED ORDER — DARBEPOETIN ALFA 100 MCG/0.5ML IJ SOSY
100.0000 ug | PREFILLED_SYRINGE | INTRAMUSCULAR | Status: DC
  Administered 2016-12-10: 100 ug via SUBCUTANEOUS

## 2016-12-10 MED ORDER — DARBEPOETIN ALFA 100 MCG/0.5ML IJ SOSY
PREFILLED_SYRINGE | INTRAMUSCULAR | Status: AC
  Filled 2016-12-10: qty 0.5

## 2016-12-12 DIAGNOSIS — I872 Venous insufficiency (chronic) (peripheral): Secondary | ICD-10-CM | POA: Diagnosis not present

## 2016-12-12 DIAGNOSIS — I129 Hypertensive chronic kidney disease with stage 1 through stage 4 chronic kidney disease, or unspecified chronic kidney disease: Secondary | ICD-10-CM | POA: Diagnosis not present

## 2016-12-12 DIAGNOSIS — D510 Vitamin B12 deficiency anemia due to intrinsic factor deficiency: Secondary | ICD-10-CM | POA: Diagnosis not present

## 2016-12-12 DIAGNOSIS — Z7902 Long term (current) use of antithrombotics/antiplatelets: Secondary | ICD-10-CM | POA: Diagnosis not present

## 2016-12-12 DIAGNOSIS — Z79899 Other long term (current) drug therapy: Secondary | ICD-10-CM | POA: Diagnosis not present

## 2016-12-12 DIAGNOSIS — N183 Chronic kidney disease, stage 3 (moderate): Secondary | ICD-10-CM | POA: Diagnosis not present

## 2016-12-12 DIAGNOSIS — Z8673 Personal history of transient ischemic attack (TIA), and cerebral infarction without residual deficits: Secondary | ICD-10-CM | POA: Diagnosis not present

## 2017-01-07 ENCOUNTER — Inpatient Hospital Stay (HOSPITAL_COMMUNITY): Admission: RE | Admit: 2017-01-07 | Payer: Medicare Other | Source: Ambulatory Visit

## 2017-01-15 DIAGNOSIS — I129 Hypertensive chronic kidney disease with stage 1 through stage 4 chronic kidney disease, or unspecified chronic kidney disease: Secondary | ICD-10-CM | POA: Diagnosis not present

## 2017-01-15 DIAGNOSIS — D510 Vitamin B12 deficiency anemia due to intrinsic factor deficiency: Secondary | ICD-10-CM | POA: Diagnosis not present

## 2017-01-15 DIAGNOSIS — I872 Venous insufficiency (chronic) (peripheral): Secondary | ICD-10-CM | POA: Diagnosis not present

## 2017-01-15 DIAGNOSIS — N183 Chronic kidney disease, stage 3 (moderate): Secondary | ICD-10-CM | POA: Diagnosis not present

## 2017-01-15 DIAGNOSIS — Z79899 Other long term (current) drug therapy: Secondary | ICD-10-CM | POA: Diagnosis not present

## 2017-01-15 DIAGNOSIS — Z7902 Long term (current) use of antithrombotics/antiplatelets: Secondary | ICD-10-CM | POA: Diagnosis not present

## 2017-01-15 DIAGNOSIS — Z8673 Personal history of transient ischemic attack (TIA), and cerebral infarction without residual deficits: Secondary | ICD-10-CM | POA: Diagnosis not present

## 2017-02-14 DIAGNOSIS — D510 Vitamin B12 deficiency anemia due to intrinsic factor deficiency: Secondary | ICD-10-CM | POA: Diagnosis not present

## 2017-02-14 DIAGNOSIS — N183 Chronic kidney disease, stage 3 (moderate): Secondary | ICD-10-CM | POA: Diagnosis not present

## 2017-02-14 DIAGNOSIS — Z8673 Personal history of transient ischemic attack (TIA), and cerebral infarction without residual deficits: Secondary | ICD-10-CM | POA: Diagnosis not present

## 2017-02-14 DIAGNOSIS — Z7902 Long term (current) use of antithrombotics/antiplatelets: Secondary | ICD-10-CM | POA: Diagnosis not present

## 2017-02-14 DIAGNOSIS — I872 Venous insufficiency (chronic) (peripheral): Secondary | ICD-10-CM | POA: Diagnosis not present

## 2017-02-14 DIAGNOSIS — I129 Hypertensive chronic kidney disease with stage 1 through stage 4 chronic kidney disease, or unspecified chronic kidney disease: Secondary | ICD-10-CM | POA: Diagnosis not present

## 2017-02-14 DIAGNOSIS — Z79899 Other long term (current) drug therapy: Secondary | ICD-10-CM | POA: Diagnosis not present

## 2017-02-20 DIAGNOSIS — E875 Hyperkalemia: Secondary | ICD-10-CM | POA: Diagnosis not present

## 2017-02-20 DIAGNOSIS — I129 Hypertensive chronic kidney disease with stage 1 through stage 4 chronic kidney disease, or unspecified chronic kidney disease: Secondary | ICD-10-CM | POA: Diagnosis not present

## 2017-02-20 DIAGNOSIS — D631 Anemia in chronic kidney disease: Secondary | ICD-10-CM | POA: Diagnosis not present

## 2017-02-20 DIAGNOSIS — N184 Chronic kidney disease, stage 4 (severe): Secondary | ICD-10-CM | POA: Diagnosis not present

## 2017-02-20 DIAGNOSIS — N2581 Secondary hyperparathyroidism of renal origin: Secondary | ICD-10-CM | POA: Diagnosis not present

## 2017-02-20 LAB — HEPATIC FUNCTION PANEL
ALT: 8 (ref 7–35)
AST: 13 (ref 13–35)
Alkaline Phosphatase: 95 (ref 25–125)
Bilirubin, Total: 0.2

## 2017-02-20 LAB — CBC AND DIFFERENTIAL
HCT: 27 — AB (ref 36–46)
Hemoglobin: 9.1 — AB (ref 12.0–16.0)
Neutrophils Absolute: 4
Platelets: 182 (ref 150–399)
WBC: 6.5

## 2017-02-20 LAB — BASIC METABOLIC PANEL
BUN: 62 — AB (ref 4–21)
CREATININE: 2 — AB (ref 0.5–1.1)
GLUCOSE: 96
POTASSIUM: 5.4 — AB (ref 3.4–5.3)
Sodium: 143 (ref 137–147)

## 2017-02-25 ENCOUNTER — Encounter: Payer: Self-pay | Admitting: Family Medicine

## 2017-03-01 DIAGNOSIS — Z23 Encounter for immunization: Secondary | ICD-10-CM | POA: Diagnosis not present

## 2017-03-06 ENCOUNTER — Ambulatory Visit (HOSPITAL_COMMUNITY)
Admission: RE | Admit: 2017-03-06 | Discharge: 2017-03-06 | Disposition: A | Discharge status: Home / Self Care | Payer: Medicare Other | Source: Ambulatory Visit | Attending: Nephrology | Admitting: Nephrology

## 2017-03-06 VITALS — BP 126/37 | HR 63 | Temp 98.0°F | Resp 20

## 2017-03-06 DIAGNOSIS — D631 Anemia in chronic kidney disease: Secondary | ICD-10-CM | POA: Insufficient documentation

## 2017-03-06 DIAGNOSIS — N184 Chronic kidney disease, stage 4 (severe): Secondary | ICD-10-CM | POA: Insufficient documentation

## 2017-03-06 LAB — RENAL FUNCTION PANEL
ALBUMIN: 3.6 g/dL (ref 3.5–5.0)
ANION GAP: 9 (ref 5–15)
BUN: 61 mg/dL — ABNORMAL HIGH (ref 6–20)
CALCIUM: 9.2 mg/dL (ref 8.9–10.3)
CO2: 24 mmol/L (ref 22–32)
Chloride: 109 mmol/L (ref 101–111)
Creatinine, Ser: 2.37 mg/dL — ABNORMAL HIGH (ref 0.44–1.00)
GFR calc Af Amer: 19 mL/min — ABNORMAL LOW (ref >60)
GFR, EST NON AFRICAN AMERICAN: 16 mL/min — AB (ref >60)
GLUCOSE: 129 mg/dL — AB (ref 65–99)
PHOSPHORUS: 4.4 mg/dL (ref 2.5–4.6)
POTASSIUM: 4.7 mmol/L (ref 3.5–5.1)
SODIUM: 142 mmol/L (ref 135–145)

## 2017-03-06 LAB — IRON AND TIBC
Iron: 72 ug/dL (ref 28–170)
Saturation Ratios: 29 % (ref 10.4–31.8)
TIBC: 245 ug/dL — ABNORMAL LOW (ref 250–450)
UIBC: 173 ug/dL

## 2017-03-06 LAB — POCT HEMOGLOBIN-HEMACUE: HEMOGLOBIN: 9.1 g/dL — AB (ref 12.0–15.0)

## 2017-03-06 LAB — FERRITIN: Ferritin: 202 ng/mL (ref 11–307)

## 2017-03-06 MED ORDER — DARBEPOETIN ALFA 100 MCG/0.5ML IJ SOSY
100.0000 ug | PREFILLED_SYRINGE | INTRAMUSCULAR | Status: DC
  Administered 2017-03-06: 100 ug via SUBCUTANEOUS

## 2017-03-06 MED ORDER — DARBEPOETIN ALFA 100 MCG/0.5ML IJ SOSY
PREFILLED_SYRINGE | INTRAMUSCULAR | Status: AC
  Administered 2017-03-06: 100 ug via SUBCUTANEOUS
  Filled 2017-03-06: qty 0.5

## 2017-03-12 DIAGNOSIS — D510 Vitamin B12 deficiency anemia due to intrinsic factor deficiency: Secondary | ICD-10-CM | POA: Diagnosis not present

## 2017-03-12 DIAGNOSIS — I872 Venous insufficiency (chronic) (peripheral): Secondary | ICD-10-CM | POA: Diagnosis not present

## 2017-03-12 DIAGNOSIS — I129 Hypertensive chronic kidney disease with stage 1 through stage 4 chronic kidney disease, or unspecified chronic kidney disease: Secondary | ICD-10-CM | POA: Diagnosis not present

## 2017-03-12 DIAGNOSIS — N183 Chronic kidney disease, stage 3 (moderate): Secondary | ICD-10-CM | POA: Diagnosis not present

## 2017-03-12 DIAGNOSIS — Z79899 Other long term (current) drug therapy: Secondary | ICD-10-CM | POA: Diagnosis not present

## 2017-03-17 DIAGNOSIS — Z7902 Long term (current) use of antithrombotics/antiplatelets: Secondary | ICD-10-CM | POA: Diagnosis not present

## 2017-03-17 DIAGNOSIS — Z79899 Other long term (current) drug therapy: Secondary | ICD-10-CM | POA: Diagnosis not present

## 2017-03-17 DIAGNOSIS — Z8673 Personal history of transient ischemic attack (TIA), and cerebral infarction without residual deficits: Secondary | ICD-10-CM | POA: Diagnosis not present

## 2017-03-17 DIAGNOSIS — I872 Venous insufficiency (chronic) (peripheral): Secondary | ICD-10-CM | POA: Diagnosis not present

## 2017-03-17 DIAGNOSIS — D510 Vitamin B12 deficiency anemia due to intrinsic factor deficiency: Secondary | ICD-10-CM | POA: Diagnosis not present

## 2017-03-17 DIAGNOSIS — N183 Chronic kidney disease, stage 3 (moderate): Secondary | ICD-10-CM | POA: Diagnosis not present

## 2017-03-17 DIAGNOSIS — I129 Hypertensive chronic kidney disease with stage 1 through stage 4 chronic kidney disease, or unspecified chronic kidney disease: Secondary | ICD-10-CM | POA: Diagnosis not present

## 2017-03-24 DIAGNOSIS — N183 Chronic kidney disease, stage 3 (moderate): Secondary | ICD-10-CM | POA: Diagnosis not present

## 2017-03-24 DIAGNOSIS — I129 Hypertensive chronic kidney disease with stage 1 through stage 4 chronic kidney disease, or unspecified chronic kidney disease: Secondary | ICD-10-CM | POA: Diagnosis not present

## 2017-03-24 DIAGNOSIS — D510 Vitamin B12 deficiency anemia due to intrinsic factor deficiency: Secondary | ICD-10-CM | POA: Diagnosis not present

## 2017-03-24 DIAGNOSIS — Z79899 Other long term (current) drug therapy: Secondary | ICD-10-CM | POA: Diagnosis not present

## 2017-04-03 ENCOUNTER — Encounter (HOSPITAL_COMMUNITY)
Admission: RE | Admit: 2017-04-03 | Discharge: 2017-04-03 | Disposition: A | Discharge status: Home / Self Care | Payer: Medicare Other | Source: Ambulatory Visit | Attending: Nephrology | Admitting: Nephrology

## 2017-04-03 VITALS — BP 139/42 | HR 55 | Temp 98.0°F | Resp 20

## 2017-04-03 DIAGNOSIS — N184 Chronic kidney disease, stage 4 (severe): Secondary | ICD-10-CM | POA: Diagnosis not present

## 2017-04-03 LAB — CBC
HCT: 31.5 % — ABNORMAL LOW (ref 36.0–46.0)
HEMOGLOBIN: 10.3 g/dL — AB (ref 12.0–15.0)
MCH: 33.8 pg (ref 26.0–34.0)
MCHC: 32.7 g/dL (ref 30.0–36.0)
MCV: 103.3 fL — AB (ref 78.0–100.0)
Platelets: 163 10*3/uL (ref 150–400)
RBC: 3.05 MIL/uL — AB (ref 3.87–5.11)
RDW: 13.6 % (ref 11.5–15.5)
WBC: 6 10*3/uL (ref 4.0–10.5)

## 2017-04-03 MED ORDER — DARBEPOETIN ALFA 100 MCG/0.5ML IJ SOSY
PREFILLED_SYRINGE | INTRAMUSCULAR | Status: AC
  Administered 2017-04-03: 11:00:00 100 ug via SUBCUTANEOUS
  Filled 2017-04-03: qty 0.5

## 2017-04-03 MED ORDER — DARBEPOETIN ALFA 100 MCG/0.5ML IJ SOSY
100.0000 ug | PREFILLED_SYRINGE | INTRAMUSCULAR | Status: DC
  Administered 2017-04-03: 100 ug via SUBCUTANEOUS

## 2017-04-16 DIAGNOSIS — I872 Venous insufficiency (chronic) (peripheral): Secondary | ICD-10-CM | POA: Diagnosis not present

## 2017-04-16 DIAGNOSIS — Z79899 Other long term (current) drug therapy: Secondary | ICD-10-CM | POA: Diagnosis not present

## 2017-04-16 DIAGNOSIS — D510 Vitamin B12 deficiency anemia due to intrinsic factor deficiency: Secondary | ICD-10-CM | POA: Diagnosis not present

## 2017-04-16 DIAGNOSIS — Z7902 Long term (current) use of antithrombotics/antiplatelets: Secondary | ICD-10-CM | POA: Diagnosis not present

## 2017-04-16 DIAGNOSIS — Z8673 Personal history of transient ischemic attack (TIA), and cerebral infarction without residual deficits: Secondary | ICD-10-CM | POA: Diagnosis not present

## 2017-04-16 DIAGNOSIS — N183 Chronic kidney disease, stage 3 (moderate): Secondary | ICD-10-CM | POA: Diagnosis not present

## 2017-04-16 DIAGNOSIS — I129 Hypertensive chronic kidney disease with stage 1 through stage 4 chronic kidney disease, or unspecified chronic kidney disease: Secondary | ICD-10-CM | POA: Diagnosis not present

## 2017-05-01 ENCOUNTER — Encounter (HOSPITAL_COMMUNITY)
Admission: RE | Admit: 2017-05-01 | Discharge: 2017-05-01 | Disposition: A | Discharge status: Home / Self Care | Payer: Medicare Other | Source: Ambulatory Visit | Attending: Nephrology | Admitting: Nephrology

## 2017-05-01 VITALS — BP 170/45 | HR 58 | Temp 98.2°F | Resp 20

## 2017-05-01 DIAGNOSIS — N184 Chronic kidney disease, stage 4 (severe): Secondary | ICD-10-CM | POA: Diagnosis not present

## 2017-05-01 LAB — RENAL FUNCTION PANEL
Albumin: 3.6 g/dL (ref 3.5–5.0)
Anion gap: 7 (ref 5–15)
BUN: 60 mg/dL — ABNORMAL HIGH (ref 6–20)
CALCIUM: 9.4 mg/dL (ref 8.9–10.3)
CO2: 24 mmol/L (ref 22–32)
Chloride: 112 mmol/L — ABNORMAL HIGH (ref 101–111)
Creatinine, Ser: 1.95 mg/dL — ABNORMAL HIGH (ref 0.44–1.00)
GFR calc non Af Amer: 21 mL/min — ABNORMAL LOW (ref >60)
GFR, EST AFRICAN AMERICAN: 24 mL/min — AB (ref >60)
GLUCOSE: 132 mg/dL — AB (ref 65–99)
PHOSPHORUS: 3.6 mg/dL (ref 2.5–4.6)
POTASSIUM: 4.5 mmol/L (ref 3.5–5.1)
Sodium: 143 mmol/L (ref 135–145)

## 2017-05-01 LAB — IRON AND TIBC
IRON: 61 ug/dL (ref 28–170)
Saturation Ratios: 22 % (ref 10.4–31.8)
TIBC: 276 ug/dL (ref 250–450)
UIBC: 215 ug/dL

## 2017-05-01 LAB — POCT HEMOGLOBIN-HEMACUE: Hemoglobin: 10.2 g/dL — ABNORMAL LOW (ref 12.0–15.0)

## 2017-05-01 LAB — FERRITIN: Ferritin: 119 ng/mL (ref 11–307)

## 2017-05-01 MED ORDER — DARBEPOETIN ALFA 100 MCG/0.5ML IJ SOSY
PREFILLED_SYRINGE | INTRAMUSCULAR | Status: AC
  Filled 2017-05-01: qty 0.5

## 2017-05-01 MED ORDER — DARBEPOETIN ALFA 100 MCG/0.5ML IJ SOSY
100.0000 ug | PREFILLED_SYRINGE | INTRAMUSCULAR | Status: DC
  Administered 2017-05-01: 100 ug via SUBCUTANEOUS

## 2017-05-08 ENCOUNTER — Encounter: Payer: Self-pay | Admitting: Family Medicine

## 2017-05-08 ENCOUNTER — Ambulatory Visit (INDEPENDENT_AMBULATORY_CARE_PROVIDER_SITE_OTHER): Payer: Medicare Other | Admitting: Family Medicine

## 2017-05-08 VITALS — BP 190/60 | HR 56 | Temp 98.1°F | Resp 16 | Ht 64.0 in | Wt 119.5 lb

## 2017-05-08 DIAGNOSIS — R609 Edema, unspecified: Secondary | ICD-10-CM

## 2017-05-08 DIAGNOSIS — I872 Venous insufficiency (chronic) (peripheral): Secondary | ICD-10-CM

## 2017-05-08 NOTE — Progress Notes (Signed)
OFFICE VISIT  05/08/2017   CC:  Chief Complaint  Patient presents with  . Follow-up    leg sweling   HPI:    Patient is a 84 y.o. Caucasian female who presents unaccompanied for both feet swelling. Hx fairly reliable at best given pt's level of dementia. Sounds like chronic LE swelling has worsened some lately, some LL discomfort from distention but no pain. No weeping or broken skin.  No redness.  No fevers. She saw renal MD recently, labs were stable, including Cr 21 ml/min, K 4.5, Albumin 3.6, stable anemia of chronic renal dz. She sounds like she does some elevation of legs and wears compression stockings some but unclear how much. Takes lasix as rx'd.  BP avg at [facility] per their charting today is avg 140/60s.  Past Medical History:  Diagnosis Date  . Allergic rhinitis, cause unspecified   . Anemia of chronic kidney failure, stage 4 (severe) (HCC)    Aranesp via nephrologist  . Cataracts, bilateral   . Cerebrovascular disease, unspecified    CDopplers 1/13 showed mild mixed plaque w/ 40-59% (low end) bilat ICA stenoses, the prev right CAE & patch angioplasty were patent  . Choledocholithiasis    12/11/14 CT--nonobstructive (41mm x 6 mm)  . Chronic renal insufficiency, stage IV (severe) (HCC)    Dr. Marval Regal. Not a candidate for renal replacement therapy.  Pt does not want this, either.  . Dementia with behavioral disturbance    depakote helpful  . Diaphragmatic hernia without mention of obstruction or gangrene   . Essential hypertension   . Hyperkalemia 06/12/2015   diminished renal excretion: as of 05/2016, kayexolate started, low K diet.  . Impaired mobility    uses walker, wheelchair  . Pernicious anemia   . Pure hypercholesterolemia   . Retinal hemorrhage of right eye   . Unspecified cerebral artery occlusion with cerebral infarction    MRI Brain 12/12 showed large remote right hemispheric infarct w/ encephalomalacia w/ prominent small vessel dis superimposed  on atrophy... (residual deficits are diminished sensation on left side, left hand grip weakness,   . Unspecified disorder resulting from impaired renal function    hyperkalemia and anemia  . Unspecified hearing loss   . Unspecified venous (peripheral) insufficiency    lasix  . Unspecified vitamin D deficiency     Past Surgical History:  Procedure Laterality Date  . CAROTID ENDARTERECTOMY    . CHOLECYSTECTOMY    . ENDOSCOPIC RETROGRADE CHOLANGIOPANCREATOGRAPHY (ERCP) WITH PROPOFOL N/A 03/09/2015   Procedure: ENDOSCOPIC RETROGRADE CHOLANGIOPANCREATOGRAPHY (ERCP) WITH PROPOFOL;  Surgeon: Milus Banister, MD;  Location: [facility];  Service: Endoscopy;  Laterality: N/A;  . laser surgery on right eye  12/02/2012   Dr. [facility]    Outpatient Medications Prior to Visit  Medication Sig Dispense Refill  . acetaminophen (TYLENOL) 500 MG tablet Take 1,000 mg by mouth at bedtime.     . cetirizine (ZYRTEC) 10 MG tablet Take 10 mg by mouth daily.    . clopidogrel (PLAVIX) 75 MG tablet Take 1 tablet (75 mg total) by mouth daily. RESTART THIS IN 3 DAYS 30 tablet 3  . cyanocobalamin (,VITAMIN B-12,) 1000 MCG/ML injection Inject 1,000 mcg into the skin every 30 (thirty) days. On the 12th of every month.    . divalproex (DEPAKOTE SPRINKLE) 125 MG capsule 1 tab po qhs 30 capsule 0  . furosemide (LASIX) 20 MG tablet 1 tab po every evening 30 tablet 1  . furosemide (LASIX) 40  MG tablet Take 1 tablet (40 mg total) by mouth daily. 30 tablet 1  . hydrALAZINE (APRESOLINE) 25 MG tablet Take 1 tablet (25 mg total) by mouth 3 (three) times daily. 90 tablet 0  . loperamide (IMODIUM A-D) 2 MG tablet Take 4 mg by mouth daily.    . Multiple Vitamin (MULTIVITAMIN) capsule Take 1 capsule by mouth daily.      Marland Kitchen oxybutynin (DITROPAN) 5 MG tablet Take 5 mg by mouth daily.    . ranitidine (ZANTAC) 75 MG tablet Take 75 mg by mouth daily.    . sodium polystyrene (KAYEXALATE) powder 15 g po bid on mondays,  wednesdays, and fridays 454 g 3  . urea (CARMOL) 20 % cream Apply topically as needed. Reported on 10/26/2015    . vitamin C (ASCORBIC ACID) 500 MG tablet Take 500 mg by mouth daily.      No facility-administered medications prior to visit.     Allergies  Allergen Reactions  . Procaine Hcl Other (See Comments)    REACTION: pt states reaction to shot at dentist office w/ tachycardia \\T \ SOB (? if really from combination w/epi)    ROS As per HPI  PE: Blood pressure (!) 190/60, pulse (!) 56, temperature 98.1 F (36.7 C), temperature source Oral, resp. rate 16, height 5\' 4"  (1.626 m), weight 119 lb 8 oz (54.2 kg), SpO2 98 %. Gen: Alert, well appearing.  Patient is oriented to person and situation. AFFECT: pleasant. Legs: bilat LL's 4-5 + pitting edema down all the way into toes.  R calf atrophy noted. Mild TTP in both lower calves.  No rash/erythema.  No skin breakdown or weeping. ROM of ankles/feet/toes intact.  Cannot palpate DP or PT pulses due to extreme edema.   LABS:    Chemistry      Component Value Date/Time   NA 143 05/01/2017 1057   NA 143 02/20/2017   K 4.5 05/01/2017 1057   CL 112 (H) 05/01/2017 1057   CO2 24 05/01/2017 1057   BUN 60 (H) 05/01/2017 1057   BUN 62 (A) 02/20/2017   CREATININE 1.95 (H) 05/01/2017 1057   CREATININE 2.16 (H) 06/08/2015 1505   GLU 96 02/20/2017      Component Value Date/Time   CALCIUM 9.4 05/01/2017 1057   ALKPHOS 95 02/20/2017   AST 13 02/20/2017   ALT 8 02/20/2017   BILITOT 0.4 10/26/2015 1102     GFR 21 ml/min 05/01/17   IMPRESSION AND PLAN:  Chronic lower extremity venous insufficiency edema, complicated by CRI stage 4. I really don't see [place] getting any significant benefit in increasing her lasix w/out causing worsened renal function. Encouraged better compliance with compression hose and elevation of legs on pillows. Nanine Means is very likely not giving her a low Na diet. I want to recheck her legs in 2 weeks, make sure no  weeping or skin breakdown is occurring.  An After Visit Summary was printed and given to the patient.  FOLLOW UP: Return for f/u LE edema.  Signed:  Crissie Sickles, MD           05/08/2017

## 2017-05-14 DIAGNOSIS — Z8673 Personal history of transient ischemic attack (TIA), and cerebral infarction without residual deficits: Secondary | ICD-10-CM | POA: Diagnosis not present

## 2017-05-14 DIAGNOSIS — I872 Venous insufficiency (chronic) (peripheral): Secondary | ICD-10-CM | POA: Diagnosis not present

## 2017-05-14 DIAGNOSIS — D510 Vitamin B12 deficiency anemia due to intrinsic factor deficiency: Secondary | ICD-10-CM | POA: Diagnosis not present

## 2017-05-14 DIAGNOSIS — N183 Chronic kidney disease, stage 3 (moderate): Secondary | ICD-10-CM | POA: Diagnosis not present

## 2017-05-14 DIAGNOSIS — Z7902 Long term (current) use of antithrombotics/antiplatelets: Secondary | ICD-10-CM | POA: Diagnosis not present

## 2017-05-14 DIAGNOSIS — I129 Hypertensive chronic kidney disease with stage 1 through stage 4 chronic kidney disease, or unspecified chronic kidney disease: Secondary | ICD-10-CM | POA: Diagnosis not present

## 2017-05-14 DIAGNOSIS — Z79899 Other long term (current) drug therapy: Secondary | ICD-10-CM | POA: Diagnosis not present

## 2017-05-16 ENCOUNTER — Emergency Department (HOSPITAL_COMMUNITY): Payer: Medicare Other

## 2017-05-16 ENCOUNTER — Emergency Department (HOSPITAL_COMMUNITY)
Admission: EM | Admit: 2017-05-16 | Discharge: 2017-05-16 | Disposition: A | Discharge status: Home / Self Care | Payer: Medicare Other | Attending: Emergency Medicine | Admitting: Emergency Medicine

## 2017-05-16 ENCOUNTER — Other Ambulatory Visit: Payer: Self-pay

## 2017-05-16 ENCOUNTER — Encounter (HOSPITAL_COMMUNITY): Payer: Self-pay

## 2017-05-16 DIAGNOSIS — R100 Acute abdomen: Secondary | ICD-10-CM | POA: Diagnosis not present

## 2017-05-16 DIAGNOSIS — N184 Chronic kidney disease, stage 4 (severe): Secondary | ICD-10-CM | POA: Diagnosis not present

## 2017-05-16 DIAGNOSIS — R197 Diarrhea, unspecified: Secondary | ICD-10-CM | POA: Insufficient documentation

## 2017-05-16 DIAGNOSIS — Z8673 Personal history of transient ischemic attack (TIA), and cerebral infarction without residual deficits: Secondary | ICD-10-CM | POA: Diagnosis not present

## 2017-05-16 DIAGNOSIS — K449 Diaphragmatic hernia without obstruction or gangrene: Secondary | ICD-10-CM | POA: Diagnosis not present

## 2017-05-16 DIAGNOSIS — R109 Unspecified abdominal pain: Secondary | ICD-10-CM | POA: Diagnosis not present

## 2017-05-16 DIAGNOSIS — Z7902 Long term (current) use of antithrombotics/antiplatelets: Secondary | ICD-10-CM | POA: Insufficient documentation

## 2017-05-16 DIAGNOSIS — R110 Nausea: Secondary | ICD-10-CM | POA: Diagnosis not present

## 2017-05-16 DIAGNOSIS — Z79899 Other long term (current) drug therapy: Secondary | ICD-10-CM | POA: Insufficient documentation

## 2017-05-16 DIAGNOSIS — I129 Hypertensive chronic kidney disease with stage 1 through stage 4 chronic kidney disease, or unspecified chronic kidney disease: Secondary | ICD-10-CM | POA: Diagnosis not present

## 2017-05-16 DIAGNOSIS — K591 Functional diarrhea: Secondary | ICD-10-CM | POA: Diagnosis not present

## 2017-05-16 DIAGNOSIS — R1013 Epigastric pain: Secondary | ICD-10-CM | POA: Insufficient documentation

## 2017-05-16 LAB — URINALYSIS, ROUTINE W REFLEX MICROSCOPIC
BILIRUBIN URINE: NEGATIVE
Glucose, UA: NEGATIVE mg/dL
HGB URINE DIPSTICK: NEGATIVE
KETONES UR: NEGATIVE mg/dL
NITRITE: NEGATIVE
Protein, ur: NEGATIVE mg/dL
SPECIFIC GRAVITY, URINE: 1.01 (ref 1.005–1.030)
pH: 5 (ref 5.0–8.0)

## 2017-05-16 LAB — COMPREHENSIVE METABOLIC PANEL
ALBUMIN: 3.8 g/dL (ref 3.5–5.0)
ALT: 13 U/L — ABNORMAL LOW (ref 14–54)
AST: 18 U/L (ref 15–41)
Alkaline Phosphatase: 92 U/L (ref 38–126)
Anion gap: 6 (ref 5–15)
BILIRUBIN TOTAL: 0.5 mg/dL (ref 0.3–1.2)
BUN: 65 mg/dL — ABNORMAL HIGH (ref 6–20)
CO2: 25 mmol/L (ref 22–32)
Calcium: 9.5 mg/dL (ref 8.9–10.3)
Chloride: 111 mmol/L (ref 101–111)
Creatinine, Ser: 2.2 mg/dL — ABNORMAL HIGH (ref 0.44–1.00)
GFR calc non Af Amer: 18 mL/min — ABNORMAL LOW (ref >60)
GFR, EST AFRICAN AMERICAN: 21 mL/min — AB (ref >60)
GLUCOSE: 98 mg/dL (ref 65–99)
POTASSIUM: 5.4 mmol/L — AB (ref 3.5–5.1)
SODIUM: 142 mmol/L (ref 135–145)
TOTAL PROTEIN: 6.6 g/dL (ref 6.5–8.1)

## 2017-05-16 LAB — CBC
HEMATOCRIT: 34.9 % — AB (ref 36.0–46.0)
HEMOGLOBIN: 10.9 g/dL — AB (ref 12.0–15.0)
MCH: 32.7 pg (ref 26.0–34.0)
MCHC: 31.2 g/dL (ref 30.0–36.0)
MCV: 104.8 fL — ABNORMAL HIGH (ref 78.0–100.0)
Platelets: 164 10*3/uL (ref 150–400)
RBC: 3.33 MIL/uL — AB (ref 3.87–5.11)
RDW: 13.1 % (ref 11.5–15.5)
WBC: 5.6 10*3/uL (ref 4.0–10.5)

## 2017-05-16 LAB — I-STAT CG4 LACTIC ACID, ED: LACTIC ACID, VENOUS: 0.73 mmol/L (ref 0.5–1.9)

## 2017-05-16 LAB — LIPASE, BLOOD: Lipase: 22 U/L (ref 11–51)

## 2017-05-16 MED ORDER — IOPAMIDOL (ISOVUE-300) INJECTION 61%
30.0000 mL | Freq: Once | INTRAVENOUS | Status: AC
  Administered 2017-05-16: 30 mL via ORAL

## 2017-05-16 MED ORDER — IOPAMIDOL (ISOVUE-300) INJECTION 61%
INTRAVENOUS | Status: AC
  Administered 2017-05-16: 30 mL via ORAL
  Filled 2017-05-16: qty 30

## 2017-05-16 NOTE — ED Notes (Signed)
PT fed awaiting transport

## 2017-05-16 NOTE — ED Notes (Signed)
Pt reports that she has has generalized abdominal pain, pt reports that she has been having diarrhea Brown and loose. Pt does report some associated nausea.

## 2017-05-16 NOTE — ED Notes (Signed)
Bed: GF94 Expected date:  Expected time:  Means of arrival:  Comments: 84 yo abd pain

## 2017-05-16 NOTE — ED Triage Notes (Signed)
Pt arrived via EMS from [place]. Per EMS pt has had diarrhea x 1 week with associated abdominal pain, that worsens with BM. Pt denies N/V.  Pt is alert and oriented x 4.    EMS V/s BP 138/56 HR 60 RR 16 CBG 190

## 2017-05-16 NOTE — ED Notes (Signed)
Called reports to [facility]

## 2017-05-16 NOTE — ED Provider Notes (Signed)
[facility] DEPT Provider Note   CSN: [id number] Arrival date & time: 05/16/17  1124     History   Chief Complaint Chief Complaint  Patient presents with  . Abdominal Pain    HPI Carrie Grimes is a 84 y.o. female.  The history is provided by the patient. No language interpreter was used.  Abdominal Pain     Carrie Grimes is a 84 y.o. female who presents to the Emergency Department complaining of abdominal pain.  She presents from [facility] assisted [facility] facility for generalized abdominal pain.  Pain began last night and is waxing and waning in nature.  She has associated nausea with significant diarrhea.  No fevers, vomiting, dysuria.  Symptoms are moderate and overall improving.   Past Medical History:  Diagnosis Date  . Allergic rhinitis, cause unspecified   . Anemia of chronic kidney failure, stage 4 (severe) (HCC)    Aranesp via nephrologist  . Cataracts, bilateral   . Cerebrovascular disease, unspecified    CDopplers 1/13 showed mild mixed plaque w/ 40-59% (low end) bilat ICA stenoses, the prev right CAE & patch angioplasty were patent  . Choledocholithiasis    12/11/14 CT--nonobstructive (46mm x 6 mm)  . Chronic renal insufficiency, stage IV (severe) (HCC)    Dr. Marval Regal. Not a candidate for renal replacement therapy.  Pt does not want this, either.  . Dementia with behavioral disturbance    depakote helpful  . Diaphragmatic hernia without mention of obstruction or gangrene   . Essential hypertension   . Hyperkalemia 06/12/2015   diminished renal excretion: as of 05/2016, kayexolate started, low K diet.  . Impaired mobility    uses walker, wheelchair  . Pernicious anemia   . Pure hypercholesterolemia   . Retinal hemorrhage of right eye   . Unspecified cerebral artery occlusion with cerebral infarction    MRI Brain 12/12 showed large remote right hemispheric infarct w/ encephalomalacia w/ prominent small vessel dis  superimposed on atrophy... (residual deficits are diminished sensation on left side, left hand grip weakness,   . Unspecified disorder resulting from impaired renal function    hyperkalemia and anemia  . Unspecified hearing loss   . Unspecified venous (peripheral) insufficiency    lasix  . Unspecified vitamin D deficiency     Patient Active Problem List   Diagnosis Date Noted  . Chronic renal insufficiency, stage IV (severe) ([place]) 10/26/2015  . Hyperkalemia 06/12/2015  . Chronic renal insufficiency, stage III (moderate) ([place]) 06/12/2015  . HTN (hypertension), benign 11/02/2013  . Venous stasis dermatitis 11/02/2013  . Urinary incontinence 09/09/2012  . Dementia with behavioral disturbance 06/26/2011  . Edema 10/23/2010  . DJD (degenerative joint disease) 10/23/2010  . ALLERGIC RHINITIS 10/08/2008  . ANEMIA 08/08/2008  . VITAMIN D DEFICIENCY 07/22/2008  . HEARING LOSS 07/22/2008  . RENAL INSUFFICIENCY 10/08/2007  . HYPERCHOLESTEROLEMIA 04/12/2007  . PERNICIOUS ANEMIA 04/12/2007  . CEREBROVASCULAR DISEASE 04/12/2007  . Chronic venous insufficiency 04/09/2007  . STROKE 02/06/2007  . HIATAL HERNIA 02/06/2007    Past Surgical History:  Procedure Laterality Date  . CAROTID ENDARTERECTOMY    . CHOLECYSTECTOMY    . ENDOSCOPIC RETROGRADE CHOLANGIOPANCREATOGRAPHY (ERCP) WITH PROPOFOL N/A 03/09/2015   Procedure: ENDOSCOPIC RETROGRADE CHOLANGIOPANCREATOGRAPHY (ERCP) WITH PROPOFOL;  Surgeon: Milus Banister, MD;  Location: [facility];  Service: Endoscopy;  Laterality: N/A;  . laser surgery on right eye  12/02/2012   Dr. [facility]    OB History    No  data available       Home Medications    Prior to Admission medications   Medication Sig Start Date End Date Taking? Authorizing Provider  acetaminophen (TYLENOL) 500 MG tablet Take 1,000 mg by mouth. Three times daily and as needed for pain   Yes [provider]  cetirizine (ZYRTEC) 10 MG tablet Take 10  mg by mouth daily.   Yes [provider]  clopidogrel (PLAVIX) 75 MG tablet Take 1 tablet (75 mg total) by mouth daily. RESTART THIS IN 3 DAYS 03/09/15  Yes Milus Banister, MD  divalproex (DEPAKOTE SPRINKLE) 125 MG capsule 1 tab po qhs 10/24/16  Yes McGowen, Adrian Blackwater, MD  furosemide (LASIX) 20 MG tablet 1 tab po every evening 11/18/16  Yes McGowen, Adrian Blackwater, MD  furosemide (LASIX) 40 MG tablet Take 1 tablet (40 mg total) by mouth daily. 11/18/16  Yes McGowen, Adrian Blackwater, MD  hydrALAZINE (APRESOLINE) 25 MG tablet Take 1 tablet (25 mg total) by mouth 3 (three) times daily. 06/25/16  Yes McGowen, Adrian Blackwater, MD  loperamide (IMODIUM A-D) 2 MG tablet Take 4 mg by mouth daily.   Yes [provider]  Multiple Vitamin (MULTIVITAMIN) capsule Take 1 capsule by mouth daily.     Yes [provider]  oxybutynin (DITROPAN-XL) 5 MG 24 hr tablet  05/13/17  Yes [provider]  ranitidine (ZANTAC) 75 MG tablet Take 75 mg by mouth daily.   Yes [provider]  sodium polystyrene (KAYEXALATE) powder 15 g po bid on mondays, wednesdays, and fridays 06/26/16  Yes McGowen, Adrian Blackwater, MD  urea (CARMOL) 20 % cream Apply topically as needed. Reported on 10/26/2015   Yes [provider]  vitamin C (ASCORBIC ACID) 500 MG tablet Take 500 mg by mouth daily.    Yes [provider]  cyanocobalamin (,VITAMIN B-12,) 1000 MCG/ML injection Inject 1,000 mcg into the skin every 30 (thirty) days. On the 12th of every month.    [provider]    Family History Family History  Problem Relation Age of Onset  . Heart disease Mother   . Heart disease Father     Social History Social History   Tobacco Use  . Smoking status: Never Smoker  . Smokeless tobacco: Never Used  Substance Use Topics  . Alcohol use: No  . Drug use: No     Allergies   Procaine hcl   Review of Systems Review of Systems  Gastrointestinal: Positive for abdominal pain.  All other systems  reviewed and are negative.    Physical Exam Updated Vital Signs BP (!) 145/48 (BP Location: Right Arm)   Pulse 60   Temp 98.6 F (37 C) (Oral)   Resp 16   Ht 5\' 4"  (1.626 m)   Wt 54 kg (119 lb)   SpO2 98%   BMI 20.43 kg/m   Physical Exam  Constitutional: She is oriented to person, place, and time. She appears well-developed and well-nourished.  HENT:  Head: Normocephalic and atraumatic.  Cardiovascular: Normal rate and regular rhythm.  Murmur heard. Systolic ejection murmur  Pulmonary/Chest: Effort normal and breath sounds normal. No respiratory distress.  Abdominal: Soft. There is no rebound and no guarding.  Mild generalized abdominal tenderness  Musculoskeletal: She exhibits no tenderness.  2+ edema to bilateral lower extremities with erythematous skin changes to distal bilateral lower extremities.  Neurological: She is alert and oriented to person, place, and time.  Skin: Skin is warm and dry.  Psychiatric: She  has a normal mood and affect. Her behavior is normal.  Nursing note and vitals reviewed.    ED Treatments / Results  Labs (all labs ordered are listed, but only abnormal results are displayed) Labs Reviewed  COMPREHENSIVE METABOLIC PANEL - Abnormal; Notable for the following components:      Result Value   Potassium 5.4 (*)    BUN 65 (*)    Creatinine, Ser 2.20 (*)    ALT 13 (*)    GFR calc non Af Amer 18 (*)    GFR calc Af Amer 21 (*)    All other components within normal limits  CBC - Abnormal; Notable for the following components:   RBC 3.33 (*)    Hemoglobin 10.9 (*)    HCT 34.9 (*)    MCV 104.8 (*)    All other components within normal limits  URINALYSIS, ROUTINE W REFLEX MICROSCOPIC - Abnormal; Notable for the following components:   Leukocytes, UA TRACE (*)    Bacteria, UA MANY (*)    Squamous Epithelial / LPF 0-5 (*)    All other components within normal limits  LIPASE, BLOOD  I-STAT CG4 LACTIC ACID, ED  I-STAT CG4 LACTIC ACID, ED     EKG  EKG Interpretation None       Radiology Ct Abdomen Pelvis Wo Contrast  Result Date: 05/16/2017 CLINICAL DATA:  Generalized abdominal pain. EXAM: CT ABDOMEN AND PELVIS WITHOUT CONTRAST TECHNIQUE: Multidetector CT imaging of the abdomen and pelvis was performed following the standard protocol without IV contrast. COMPARISON:  CT scan of December 11, 2014. FINDINGS: Lower chest: Moderate size sliding-type hiatal hernia is noted. Visualized lung bases are unremarkable. Hepatobiliary: No focal liver abnormality is seen. Status post cholecystectomy. No biliary dilatation. Pancreas: Stable diffuse pancreatic atrophy is noted. Spleen: Normal in size without focal abnormality. Adrenals/Urinary Tract: Adrenal glands appear normal. Mild bilateral renal atrophy is noted. No hydronephrosis or renal obstruction is noted. No renal or ureteral calculi are noted. Urinary bladder is unremarkable. Stomach/Bowel: Sigmoid diverticulosis is noted without inflammation. There is no evidence of bowel obstruction or inflammation. The appendix is not visualized. Vascular/Lymphatic: Aortic atherosclerosis. No enlarged abdominal or pelvic lymph nodes. Reproductive: Uterus and bilateral adnexa are unremarkable. Other: No abdominal wall hernia or abnormality. No abdominopelvic ascites. Musculoskeletal: No acute or significant osseous findings. IMPRESSION: Moderate size sliding-type hiatal hernia. Stable diffuse pancreatic atrophy. Mild bilateral renal atrophy is noted. No hydronephrosis or renal obstruction is noted. Sigmoid diverticulosis without inflammation. Aortic atherosclerosis. No acute abnormality seen in the abdomen or pelvis. Electronically Signed   By: Marijo Conception, M.D.   On: 05/16/2017 14:47    Procedures Procedures (including critical care time)  Medications Ordered in ED Medications  iopamidol (ISOVUE-300) 61 % injection 30 mL (30 mLs Oral Contrast Given 05/16/17 1304)     Initial Impression /  Assessment and Plan / ED Course  I have reviewed the triage vital signs and the nursing notes.  Pertinent labs & imaging results that were available during my care of the patient were reviewed by me and considered in my medical decision making (see chart for details).     Patient here for evaluation of epigastric abdominal pain, diarrhea.  Overall her abdominal pain is improving and on initial assessment she had mild generalized tenderness and on repeat assessment she had no tenderness.  She is tolerating orals in the department with no difficulty.  No recurrent diarrhea.  Labs demonstrate stable renal insufficiency.  Imaging with no  evidence of diverticulitis, bowel obstruction or features of acute abdomen.  UA is not consistent with UTI in setting of her current symptoms, will send culture and only treat if positive.  Discussed with patient home care for abdominal pain and diarrhea.  Discussed importance of PCP follow-up as well as return precautions.  Final Clinical Impressions(s) / ED Diagnoses   Final diagnoses:  Epigastric pain  Diarrhea, unspecified type    ED Discharge Orders    None       Quintella Reichert, MD 05/16/17 [phone]

## 2017-05-16 NOTE — ED Notes (Signed)
Pt ambulated with walker.

## 2017-05-16 NOTE — ED Triage Notes (Signed)
Pt is HOH. 

## 2017-05-16 NOTE — ED Notes (Signed)
Called [facility] to find out ETA. Unable to determine and stated pt was placed on the list on 1426 and is at the need of the list.

## 2017-05-21 ENCOUNTER — Ambulatory Visit: Payer: Medicare Other | Admitting: Family Medicine

## 2017-05-21 DIAGNOSIS — F0390 Unspecified dementia without behavioral disturbance: Secondary | ICD-10-CM | POA: Diagnosis not present

## 2017-05-21 DIAGNOSIS — Z79899 Other long term (current) drug therapy: Secondary | ICD-10-CM | POA: Diagnosis not present

## 2017-05-21 DIAGNOSIS — N183 Chronic kidney disease, stage 3 (moderate): Secondary | ICD-10-CM | POA: Diagnosis not present

## 2017-05-21 DIAGNOSIS — I872 Venous insufficiency (chronic) (peripheral): Secondary | ICD-10-CM | POA: Diagnosis not present

## 2017-05-21 DIAGNOSIS — I129 Hypertensive chronic kidney disease with stage 1 through stage 4 chronic kidney disease, or unspecified chronic kidney disease: Secondary | ICD-10-CM | POA: Diagnosis not present

## 2017-05-21 DIAGNOSIS — Z8673 Personal history of transient ischemic attack (TIA), and cerebral infarction without residual deficits: Secondary | ICD-10-CM | POA: Diagnosis not present

## 2017-05-21 DIAGNOSIS — D510 Vitamin B12 deficiency anemia due to intrinsic factor deficiency: Secondary | ICD-10-CM | POA: Diagnosis not present

## 2017-05-21 DIAGNOSIS — Z7902 Long term (current) use of antithrombotics/antiplatelets: Secondary | ICD-10-CM | POA: Diagnosis not present

## 2017-05-22 ENCOUNTER — Ambulatory Visit (INDEPENDENT_AMBULATORY_CARE_PROVIDER_SITE_OTHER): Payer: Medicare Other | Admitting: Family Medicine

## 2017-05-22 ENCOUNTER — Telehealth: Payer: Self-pay

## 2017-05-22 ENCOUNTER — Encounter: Payer: Self-pay | Admitting: Family Medicine

## 2017-05-22 VITALS — BP 159/80 | HR 62 | Temp 98.2°F | Resp 16 | Wt 116.0 lb

## 2017-05-22 DIAGNOSIS — E875 Hyperkalemia: Secondary | ICD-10-CM | POA: Diagnosis not present

## 2017-05-22 DIAGNOSIS — N184 Chronic kidney disease, stage 4 (severe): Secondary | ICD-10-CM

## 2017-05-22 DIAGNOSIS — R197 Diarrhea, unspecified: Secondary | ICD-10-CM

## 2017-05-22 DIAGNOSIS — T887XXA Unspecified adverse effect of drug or medicament, initial encounter: Secondary | ICD-10-CM | POA: Diagnosis not present

## 2017-05-22 DIAGNOSIS — R609 Edema, unspecified: Secondary | ICD-10-CM

## 2017-05-22 DIAGNOSIS — K529 Noninfective gastroenteritis and colitis, unspecified: Secondary | ICD-10-CM | POA: Diagnosis not present

## 2017-05-22 LAB — BASIC METABOLIC PANEL
BUN: 67 mg/dL — ABNORMAL HIGH (ref 6–23)
CALCIUM: 9.6 mg/dL (ref 8.4–10.5)
CHLORIDE: 106 meq/L (ref 96–112)
CO2: 27 meq/L (ref 19–32)
Creatinine, Ser: 2.14 mg/dL — ABNORMAL HIGH (ref 0.40–1.20)
GFR: 22.76 mL/min — ABNORMAL LOW (ref >60.00)
GLUCOSE: 122 mg/dL — AB (ref 70–99)
Potassium: 5 mEq/L (ref 3.5–5.1)
SODIUM: 143 meq/L (ref 135–145)

## 2017-05-22 MED ORDER — FLUTICASONE PROPIONATE 0.05 % EX CREA: TOPICAL_CREAM | CUTANEOUS | 1 refills | Status: DC

## 2017-05-22 NOTE — Telephone Encounter (Signed)
Call made to [facility], spoke to [place] inquiring about patient having diarrhea. Martie Lee stated that Carrie Grimes BM was the same and has not changed.

## 2017-05-22 NOTE — Progress Notes (Signed)
OFFICE VISIT  05/22/2017   CC:  Chief Complaint  Patient presents with  . Leg Swelling    follow up   HPI:    Patient is a 84 y.o. Caucasian female who presents for 2 week f/u chronic LE venous insufficiency edema. We did not change any meds last visit, simply tried to get better compliance with compression hose and elevation of legs. I wanted her back to make sure there has been no progression of the edema or skin changes such as weeping or ulceration. Her swelling is unchanged.  She has no info about compliance with compression hose or elevation since I last saw her.   She complains that her diarrhea is "worse" lately, estimates 2-4 loose BMs per day.   Went to ED 05/16/17 with c/o abd pain and diarrhea.  She has chronic loose BMs since being on kayexalate for her chronic hyperkalemia.  She is unable to clarify today whether or not her diarrhea is any different when I ask her. She denies any abd pain.  No nausea or vomiting.  Past Medical History:  Diagnosis Date  . Allergic rhinitis, cause unspecified   . Anemia of chronic kidney failure, stage 4 (severe) (HCC)    Aranesp via nephrologist  . Cataracts, bilateral   . Cerebrovascular disease, unspecified    CDopplers 1/13 showed mild mixed plaque w/ 40-59% (low end) bilat ICA stenoses, the prev right CAE & patch angioplasty were patent  . Choledocholithiasis    12/11/14 CT--nonobstructive (32mm x 6 mm)  . Chronic renal insufficiency, stage IV (severe) (HCC)    Dr. Marval Regal. Not a candidate for renal replacement therapy.  Pt does not want this, either.  . Dementia with behavioral disturbance    depakote helpful  . Diaphragmatic hernia without mention of obstruction or gangrene   . Essential hypertension   . Hyperkalemia 06/12/2015   diminished renal excretion: as of 05/2016, kayexolate started, low K diet.  . Impaired mobility    uses walker, wheelchair  . Pernicious anemia   . Pure hypercholesterolemia   . Retinal hemorrhage  of right eye   . Unspecified cerebral artery occlusion with cerebral infarction    MRI Brain 12/12 showed large remote right hemispheric infarct w/ encephalomalacia w/ prominent small vessel dis superimposed on atrophy... (residual deficits are diminished sensation on left side, left hand grip weakness,   . Unspecified disorder resulting from impaired renal function    hyperkalemia and anemia  . Unspecified hearing loss   . Unspecified venous (peripheral) insufficiency    lasix  . Unspecified vitamin D deficiency     Past Surgical History:  Procedure Laterality Date  . CAROTID ENDARTERECTOMY    . CHOLECYSTECTOMY    . ENDOSCOPIC RETROGRADE CHOLANGIOPANCREATOGRAPHY (ERCP) WITH PROPOFOL N/A 03/09/2015   Procedure: ENDOSCOPIC RETROGRADE CHOLANGIOPANCREATOGRAPHY (ERCP) WITH PROPOFOL;  Surgeon: Milus Banister, MD;  Location: [facility];  Service: Endoscopy;  Laterality: N/A;  . laser surgery on right eye  12/02/2012   Dr[facility]    Outpatient Medications Prior to Visit  Medication Sig Dispense Refill  . acetaminophen (TYLENOL) 500 MG tablet Take 1,000 mg by mouth. Three times daily and as needed for pain    . cetirizine (ZYRTEC) 10 MG tablet Take 10 mg by mouth daily.    . clopidogrel (PLAVIX) 75 MG tablet Take 1 tablet (75 mg total) by mouth daily. RESTART THIS IN 3 DAYS 30 tablet 3  . cyanocobalamin (,VITAMIN B-12,) 1000 MCG/ML injection Inject  1,000 mcg into the skin every 30 (thirty) days. On the 12th of every month.    . divalproex (DEPAKOTE SPRINKLE) 125 MG capsule 1 tab po qhs 30 capsule 0  . furosemide (LASIX) 20 MG tablet 1 tab po every evening 30 tablet 1  . furosemide (LASIX) 40 MG tablet Take 1 tablet (40 mg total) by mouth daily. 30 tablet 1  . hydrALAZINE (APRESOLINE) 25 MG tablet Take 1 tablet (25 mg total) by mouth 3 (three) times daily. 90 tablet 0  . loperamide (IMODIUM A-D) 2 MG tablet Take 4 mg by mouth daily.    . Multiple Vitamin (MULTIVITAMIN)  capsule Take 1 capsule by mouth daily.      Marland Kitchen oxybutynin (DITROPAN-XL) 5 MG 24 hr tablet     . ranitidine (ZANTAC) 75 MG tablet Take 75 mg by mouth daily.    . sodium polystyrene (KAYEXALATE) powder 15 g po bid on mondays, wednesdays, and fridays 454 g 3  . urea (CARMOL) 20 % cream Apply topically as needed. Reported on 10/26/2015    . vitamin C (ASCORBIC ACID) 500 MG tablet Take 500 mg by mouth daily.     . SPS 15 GM/60ML suspension      No facility-administered medications prior to visit.     Allergies  Allergen Reactions  . Procaine Hcl Other (See Comments)    REACTION: pt states reaction to shot at dentist office w/ tachycardia \\T \ SOB (? if really from combination w/epi)    ROS As per HPI  PE: Blood pressure (!) 159/80, pulse 62, temperature 98.2 F (36.8 C), temperature source Oral, resp. rate 16, weight 116 lb (52.6 kg), SpO2 97 %. Gen: Alert, well appearing.  Patient is oriented to person and general situation. Pleasantly demented, no distress. EVO:JJKK: no injection, icteris, swelling, or exudate.  EOMI, PERRLA. Mouth: lips without lesion/swelling.  Oral mucosa pink and moist. Oropharynx without erythema, exudate, or swelling.  EXT: Both ankles with 4+ pitting edema, R calf atrophy. She does have some subtle pinkish color to the skin of anterior aspect of both ankles and it is mildly warm in this area. No tenderness to palpation in these areas.  No streaking.  No calf tenderness.   No skin breakdown, no weeping.    LABS:    Chemistry      Component Value Date/Time   NA 143 05/22/2017 0948   NA 143 02/20/2017   K 5.0 05/22/2017 0948   CL 106 05/22/2017 0948   CO2 27 05/22/2017 0948   BUN 67 (H) 05/22/2017 0948   BUN 62 (A) 02/20/2017   CREATININE 2.14 (H) 05/22/2017 0948   CREATININE 2.16 (H) 06/08/2015 1505   GLU 96 02/20/2017      Component Value Date/Time   CALCIUM 9.6 05/22/2017 0948   ALKPHOS 92 05/16/2017 1237   AST 18 05/16/2017 1237   ALT 13 (L)  05/16/2017 1237   BILITOT 0.5 05/16/2017 1237       IMPRESSION AND PLAN:  1) Chronic bilat LE edema/chronic venous insufficiency: stable. No room to go up on diuretic b/c of her CRI.  Continue all current measures.  2) Mild/early venous stasis dermatitis: very low suspicion that this is cellulitis. Rx'd cutivate 0.05% cream, apply bid prn.  3) Diarrhea: suspect that this is unchanged compared to her chronic bowel habits since getting on kayexalate for her chronic hyperkalemia secondary to CRI stage 4.  Will try to clarify this with a family member by phone or a nurse  at [facility]. Check BMET today. If diarrhea is significantly above baseline, will d/c kayexalate and start trial of veltassa.  An After Visit Summary was printed and given to the patient.  FOLLOW UP: Return in about 10 days (around 06/01/2017) for f/u edema/diarrhea/LE dermatitis.  Signed:  Crissie Sickles, MD           05/22/2017   ADDENDUM: talked to pt's daughter, Hassan Rowan, and to a caregiver at [place]. Hassan Rowan did not have any additional history to add and could not clarify pt's level of diarrhea compared to her baseline.  However, the caregiver at [facility] said her loose BMs are STABLE--no change from her chronic/baseline since being on kayexalate.  Signed:  Crissie Sickles, MD           05/22/2017

## 2017-05-22 NOTE — Telephone Encounter (Signed)
Noted agree

## 2017-05-23 ENCOUNTER — Other Ambulatory Visit (INDEPENDENT_AMBULATORY_CARE_PROVIDER_SITE_OTHER): Payer: Medicare Other

## 2017-05-23 ENCOUNTER — Other Ambulatory Visit: Payer: Self-pay | Admitting: Family Medicine

## 2017-05-23 DIAGNOSIS — N184 Chronic kidney disease, stage 4 (severe): Secondary | ICD-10-CM

## 2017-05-23 LAB — MAGNESIUM: MAGNESIUM: 1.9 mg/dL (ref 1.5–2.5)

## 2017-05-29 ENCOUNTER — Encounter (HOSPITAL_COMMUNITY)
Admission: RE | Admit: 2017-05-29 | Discharge: 2017-05-29 | Disposition: A | Discharge status: Home / Self Care | Payer: Medicare Other | Source: Ambulatory Visit | Attending: Nephrology | Admitting: Nephrology

## 2017-05-29 VITALS — BP 136/45 | HR 66 | Temp 97.7°F | Resp 16

## 2017-05-29 DIAGNOSIS — N184 Chronic kidney disease, stage 4 (severe): Secondary | ICD-10-CM

## 2017-05-29 LAB — POCT HEMOGLOBIN-HEMACUE: HEMOGLOBIN: 10.4 g/dL — AB (ref 12.0–15.0)

## 2017-05-29 MED ORDER — DARBEPOETIN ALFA 100 MCG/0.5ML IJ SOSY
100.0000 ug | PREFILLED_SYRINGE | INTRAMUSCULAR | Status: DC
  Administered 2017-05-29: 100 ug via SUBCUTANEOUS

## 2017-05-29 MED ORDER — DARBEPOETIN ALFA 100 MCG/0.5ML IJ SOSY
PREFILLED_SYRINGE | INTRAMUSCULAR | Status: AC
  Filled 2017-05-29: qty 0.5

## 2017-06-02 ENCOUNTER — Ambulatory Visit: Payer: Medicare Other | Admitting: Family Medicine

## 2017-06-03 ENCOUNTER — Ambulatory Visit (INDEPENDENT_AMBULATORY_CARE_PROVIDER_SITE_OTHER): Payer: Medicare Other | Admitting: Family Medicine

## 2017-06-03 ENCOUNTER — Encounter: Payer: Self-pay | Admitting: Family Medicine

## 2017-06-03 VITALS — BP 122/46 | HR 61 | Temp 97.8°F | Wt 115.5 lb

## 2017-06-03 DIAGNOSIS — R600 Localized edema: Secondary | ICD-10-CM | POA: Diagnosis not present

## 2017-06-03 DIAGNOSIS — I872 Venous insufficiency (chronic) (peripheral): Secondary | ICD-10-CM

## 2017-06-03 DIAGNOSIS — T50905D Adverse effect of unspecified drugs, medicaments and biological substances, subsequent encounter: Secondary | ICD-10-CM | POA: Diagnosis not present

## 2017-06-03 DIAGNOSIS — E875 Hyperkalemia: Secondary | ICD-10-CM | POA: Diagnosis not present

## 2017-06-03 DIAGNOSIS — K529 Noninfective gastroenteritis and colitis, unspecified: Secondary | ICD-10-CM | POA: Diagnosis not present

## 2017-06-03 NOTE — Progress Notes (Signed)
OFFICE VISIT  06/03/2017   CC:  Chief Complaint  Patient presents with  . Follow-up    edema/diarrhea/LE dermatitis    HPI:    Patient is a 84 y.o. Caucasian female with significant dementia who presents ALONE for 10d f/u chronic LE venous stasis edema with stasis dermatitis. Also f/u diarrhea which is chronic and due mostly to [facility] that she has to take for her hyperkalemia (secondary to CRI). Last visit her renal function and potassium were stable. I made no med changes last o/v.  I did add cutivate for her stasis dermatitis. It appears from review of her assisted living med sheet today that they are not applying the cutivate I rx'd last visit.   Past Medical History:  Diagnosis Date  . Allergic rhinitis, cause unspecified   . Anemia of chronic kidney failure, stage 4 (severe) (HCC)    Aranesp via nephrologist  . Cataracts, bilateral   . Cerebrovascular disease, unspecified    CDopplers 1/13 showed mild mixed plaque w/ 40-59% (low end) bilat ICA stenoses, the prev right CAE & patch angioplasty were patent  . Choledocholithiasis    12/11/14 CT--nonobstructive (7mm x 6 mm)  . Chronic renal insufficiency, stage IV (severe) (HCC)    Dr. Marval Regal. Not a candidate for renal replacement therapy.  Pt does not want this, either.  . Dementia with behavioral disturbance    depakote helpful  . Diaphragmatic hernia without mention of obstruction or gangrene   . Essential hypertension   . Hyperkalemia 06/12/2015   diminished renal excretion: as of 05/2016, kayexolate started, low K diet.  . Impaired mobility    uses walker, wheelchair  . Pernicious anemia   . Pure hypercholesterolemia   . Retinal hemorrhage of right eye   . Unspecified cerebral artery occlusion with cerebral infarction    MRI Brain 12/12 showed large remote right hemispheric infarct w/ encephalomalacia w/ prominent small vessel dis superimposed on atrophy... (residual deficits are diminished sensation on left  side, left hand grip weakness,   . Unspecified disorder resulting from impaired renal function    hyperkalemia and anemia  . Unspecified hearing loss   . Unspecified venous (peripheral) insufficiency    lasix  . Unspecified vitamin D deficiency     Past Surgical History:  Procedure Laterality Date  . CAROTID ENDARTERECTOMY    . CHOLECYSTECTOMY    . ENDOSCOPIC RETROGRADE CHOLANGIOPANCREATOGRAPHY (ERCP) WITH PROPOFOL N/A 03/09/2015   Procedure: ENDOSCOPIC RETROGRADE CHOLANGIOPANCREATOGRAPHY (ERCP) WITH PROPOFOL;  Surgeon: Milus Banister, MD;  Location: [facility];  Service: Endoscopy;  Laterality: N/A;  . laser surgery on right eye  12/02/2012   [facility]    Outpatient Medications Prior to Visit  Medication Sig Dispense Refill  . acetaminophen (TYLENOL) 500 MG tablet Take 1,000 mg by mouth. Three times daily and as needed for pain    . cetirizine (ZYRTEC) 10 MG tablet Take 10 mg by mouth daily.    . clopidogrel (PLAVIX) 75 MG tablet Take 1 tablet (75 mg total) by mouth daily. RESTART THIS IN 3 DAYS 30 tablet 3  . divalproex (DEPAKOTE SPRINKLE) 125 MG capsule 1 tab po qhs 30 capsule 0  . fluticasone (CUTIVATE) 0.05 % cream Apply to affected pinkish rash of both lower extremities as needed 30 g 1  . furosemide (LASIX) 20 MG tablet 1 tab po every evening 30 tablet 1  . furosemide (LASIX) 40 MG tablet Take 1 tablet (40 mg total) by mouth daily. 30 tablet  1  . hydrALAZINE (APRESOLINE) 25 MG tablet Take 1 tablet (25 mg total) by mouth 3 (three) times daily. 90 tablet 0  . loperamide (IMODIUM A-D) 2 MG tablet Take 4 mg by mouth daily.    . Multiple Vitamin (MULTIVITAMIN) capsule Take 1 capsule by mouth daily.      Marland Kitchen oxybutynin (DITROPAN-XL) 5 MG 24 hr tablet     . ranitidine (ZANTAC) 75 MG tablet Take 75 mg by mouth daily.    . sodium polystyrene (KAYEXALATE) powder 15 g po bid on mondays, wednesdays, and fridays 454 g 3  . urea (CARMOL) 20 % cream Apply topically as  needed. Reported on 10/26/2015    . vitamin C (ASCORBIC ACID) 500 MG tablet Take 500 mg by mouth daily.     . cyanocobalamin (,VITAMIN B-12,) 1000 MCG/ML injection Inject 1,000 mcg into the skin every 30 (thirty) days. On the 12th of every month.    . SPS 15 GM/60ML suspension      No facility-administered medications prior to visit.     Allergies  Allergen Reactions  . Procaine Hcl Other (See Comments)    REACTION: pt states reaction to shot at dentist office w/ tachycardia \\T \ SOB (? if really from combination w/epi)    ROS As per HPI  PE: Blood pressure (!) 122/46, pulse 61, temperature 97.8 F (36.6 C), temperature source Temporal, weight 115 lb 8 oz (52.4 kg), SpO2 97 %. Gen: alert, interactive, very HOH.  Oriented to person and place and situation. Pleasantly demented. LEGS: R calf atrophy, 4+ pitting edema primarily in lower 1/2 of lower leg esp the ankle.  No skin breakdown, no weeping.  Mild pinkish hue anteriorly over shin, mild warmth here.  Nontender. Left LL 4+ pitting edema w/out any calf atrophy.  No skin breakdown or weeping. Mild pinkish hue anteriorly over shin, mild warmth here.  No tenderness or streaking.  LABS:    Chemistry      Component Value Date/Time   NA 143 05/22/2017 0948   NA 143 02/20/2017   K 5.0 05/22/2017 0948   CL 106 05/22/2017 0948   CO2 27 05/22/2017 0948   BUN 67 (H) 05/22/2017 0948   BUN 62 (A) 02/20/2017   CREATININE 2.14 (H) 05/22/2017 0948   CREATININE 2.16 (H) 06/08/2015 1505   GLU 96 02/20/2017      Component Value Date/Time   CALCIUM 9.6 05/22/2017 0948   ALKPHOS 92 05/16/2017 1237   AST 18 05/16/2017 1237   ALT 13 (L) 05/16/2017 1237   BILITOT 0.5 05/16/2017 1237      IMPRESSION AND PLAN:  1) Chronic LE venous insufficiency edema: stable.  Unable to push diuretic dose due to CRI (recent cr stable at 2.1). Unknown how much compliance with compression hose, elevation, and low Na diet she is having at [facility]. No  one ever comes with her to her appointments with me.  2) Chronic diarrhea, med side effect from kayexalate that she has to take for her chronic hyperkalemia. Stable.  Recent K stable at 5.0.  3) stasis dermatitis bilat LL's.  Mild.  She has cutivate cream I rx'd last o/v and I wrote a note on her paperwork for [facility] to start applying this bid x 10d.  Continue urea 74% cream application regularly.  An After Visit Summary was printed and given to the patient.  FOLLOW UP: Return in about 3 months (around 08/31/2017) for routine chronic illness f/u (30 min).  Signed:  Crissie Sickles, MD           06/03/2017

## 2017-06-10 ENCOUNTER — Ambulatory Visit: Payer: Medicare Other | Admitting: Podiatry

## 2017-06-11 ENCOUNTER — Ambulatory Visit: Payer: Medicare Other | Admitting: Podiatry

## 2017-06-12 ENCOUNTER — Other Ambulatory Visit: Payer: Self-pay | Admitting: Family Medicine

## 2017-06-12 MED ORDER — AMMONIUM LACTATE 12 % EX CREA: TOPICAL_CREAM | CUTANEOUS | 6 refills | Status: DC

## 2017-06-17 ENCOUNTER — Other Ambulatory Visit: Payer: Self-pay | Admitting: Family Medicine

## 2017-06-17 ENCOUNTER — Encounter: Payer: Self-pay | Admitting: *Deleted

## 2017-06-17 ENCOUNTER — Telehealth: Payer: Self-pay | Admitting: *Deleted

## 2017-06-17 DIAGNOSIS — D510 Vitamin B12 deficiency anemia due to intrinsic factor deficiency: Secondary | ICD-10-CM | POA: Diagnosis not present

## 2017-06-17 DIAGNOSIS — I129 Hypertensive chronic kidney disease with stage 1 through stage 4 chronic kidney disease, or unspecified chronic kidney disease: Secondary | ICD-10-CM | POA: Diagnosis not present

## 2017-06-17 DIAGNOSIS — I872 Venous insufficiency (chronic) (peripheral): Secondary | ICD-10-CM | POA: Diagnosis not present

## 2017-06-17 DIAGNOSIS — N183 Chronic kidney disease, stage 3 (moderate): Secondary | ICD-10-CM | POA: Diagnosis not present

## 2017-06-17 DIAGNOSIS — Z7902 Long term (current) use of antithrombotics/antiplatelets: Secondary | ICD-10-CM | POA: Diagnosis not present

## 2017-06-17 DIAGNOSIS — Z79899 Other long term (current) drug therapy: Secondary | ICD-10-CM | POA: Diagnosis not present

## 2017-06-17 DIAGNOSIS — Z8673 Personal history of transient ischemic attack (TIA), and cerebral infarction without residual deficits: Secondary | ICD-10-CM | POA: Diagnosis not present

## 2017-06-17 MED ORDER — PATIROMER SORBITEX CALCIUM 8.4 G PO PACK: 8.4000 g | PACK | Freq: Every day | ORAL | 0 refills | Status: DC

## 2017-06-17 NOTE — Telephone Encounter (Signed)
OK, will rx veltassa for her to try. I'll print rx. I'll write a d/c order for the kayexalate on a rx. Pt needs lab visit for BMET and magnesium level in 1 week.-thx

## 2017-06-17 NOTE — Telephone Encounter (Signed)
Copied from [place]. Topic: General - Other >> Jun 17, 2017 11:17 AM Yvette Rack wrote: Reason for CRM: Caren Griffins from [facility] is calling stating that the sodium polystyrene (KAYEXALATE) powder is making pt nauseated and she has refused to take it .the patient states that Dr Anitra Lauth was going to give her a pill if she couldn't take the powder Ms Caren Griffins also states that if the provider is going to discontinue the sodium polystyrene (KAYEXALATE) powder  Need to have a ordered to discontinue it so that the assisted Living staff can remove it from pt medicine list

## 2017-06-17 NOTE — Telephone Encounter (Signed)
Please advise. Thanks.  

## 2017-06-17 NOTE — Telephone Encounter (Signed)
SW [place] at [facility], she was advised that we will be faxed orders and new Rx. Lab apt made for 06/24/17 at 1:45pm.   Order and New Rx faxed to [phone].

## 2017-06-24 ENCOUNTER — Ambulatory Visit (INDEPENDENT_AMBULATORY_CARE_PROVIDER_SITE_OTHER): Payer: Medicare Other | Admitting: Podiatry

## 2017-06-24 ENCOUNTER — Other Ambulatory Visit: Payer: Medicare Other

## 2017-06-24 ENCOUNTER — Encounter: Payer: Self-pay | Admitting: Podiatry

## 2017-06-24 ENCOUNTER — Other Ambulatory Visit (INDEPENDENT_AMBULATORY_CARE_PROVIDER_SITE_OTHER): Payer: Medicare Other

## 2017-06-24 DIAGNOSIS — B351 Tinea unguium: Secondary | ICD-10-CM

## 2017-06-24 DIAGNOSIS — M79676 Pain in unspecified toe(s): Secondary | ICD-10-CM

## 2017-06-24 DIAGNOSIS — I10 Essential (primary) hypertension: Secondary | ICD-10-CM | POA: Diagnosis not present

## 2017-06-24 LAB — BASIC METABOLIC PANEL
BUN: 75 mg/dL — ABNORMAL HIGH (ref 6–23)
CHLORIDE: 108 meq/L (ref 96–112)
CO2: 24 mEq/L (ref 19–32)
Calcium: 9.9 mg/dL (ref 8.4–10.5)
Creatinine, Ser: 2.24 mg/dL — ABNORMAL HIGH (ref 0.40–1.20)
GFR: 21.59 mL/min — AB (ref >60.00)
GLUCOSE: 103 mg/dL — AB (ref 70–99)
POTASSIUM: 4.9 meq/L (ref 3.5–5.1)
SODIUM: 142 meq/L (ref 135–145)

## 2017-06-24 LAB — MAGNESIUM: Magnesium: 2 mg/dL (ref 1.5–2.5)

## 2017-06-24 NOTE — Progress Notes (Signed)
Patient ID: BROCHA GILLIAM, female   DOB: 05-31-1920, 84 y.o.   MRN: [id number] Complaint:  Visit Type: Patient returns to my office for continued preventative foot care services. Complaint: Patient states" my nails have grown long and thick and become painful to walk and wear shoe. The patient presents for preventative foot care services. No changes to ROS  Podiatric Exam: Vascular: dorsalis pedis and posterior tibial pulses are not palpable bilateral. Capillary return is immediate. Cold feet noted. Sensorium: Normal Semmes Weinstein monofilament test. Normal tactile sensation bilaterally. Nail Exam: Pt has thick disfigured discolored nails with subungual debris noted bilateral entire nail hallux through fifth toenails Ulcer Exam: There is no evidence of ulcer or pre-ulcerative changes or infection. Orthopedic Exam: Muscle tone and strength are WNL. No limitations in general ROM. No crepitus or effusions noted. Foot type and digits show no abnormalities. Bony prominences are unremarkable. Skin: No Porokeratosis. No infection or ulcers  Diagnosis:  Onychomycosis, , Pain in right toe, pain in left toes  Treatment & Plan Procedures and Treatment: Consent by patient was obtained for treatment procedures. The patient understood the discussion of treatment and procedures well. All questions were answered thoroughly reviewed. Debridement of mycotic and hypertrophic toenails, 1 through 5 bilateral and clearing of subungual debris. No ulceration, no infection noted.   Return Visit-Office Procedure: Patient instructed to return to the office for a follow up visit 3 months for continued evaluation and treatment.  Gardiner Barefoot DPM

## 2017-06-26 ENCOUNTER — Inpatient Hospital Stay (HOSPITAL_COMMUNITY)
Admission: RE | Admit: 2017-06-26 | Discharge: 2017-06-26 | Disposition: A | Discharge status: Home / Self Care | Payer: Medicare Other | Source: Ambulatory Visit | Attending: Nephrology | Admitting: Nephrology

## 2017-07-15 DIAGNOSIS — I129 Hypertensive chronic kidney disease with stage 1 through stage 4 chronic kidney disease, or unspecified chronic kidney disease: Secondary | ICD-10-CM | POA: Diagnosis not present

## 2017-07-15 DIAGNOSIS — N183 Chronic kidney disease, stage 3 (moderate): Secondary | ICD-10-CM | POA: Diagnosis not present

## 2017-07-15 DIAGNOSIS — Z7902 Long term (current) use of antithrombotics/antiplatelets: Secondary | ICD-10-CM | POA: Diagnosis not present

## 2017-07-15 DIAGNOSIS — Z79899 Other long term (current) drug therapy: Secondary | ICD-10-CM | POA: Diagnosis not present

## 2017-07-15 DIAGNOSIS — Z8673 Personal history of transient ischemic attack (TIA), and cerebral infarction without residual deficits: Secondary | ICD-10-CM | POA: Diagnosis not present

## 2017-07-15 DIAGNOSIS — I872 Venous insufficiency (chronic) (peripheral): Secondary | ICD-10-CM | POA: Diagnosis not present

## 2017-07-15 DIAGNOSIS — D510 Vitamin B12 deficiency anemia due to intrinsic factor deficiency: Secondary | ICD-10-CM | POA: Diagnosis not present

## 2017-07-24 ENCOUNTER — Encounter (HOSPITAL_COMMUNITY): Payer: Medicare Other

## 2017-08-14 DIAGNOSIS — I872 Venous insufficiency (chronic) (peripheral): Secondary | ICD-10-CM | POA: Diagnosis not present

## 2017-08-14 DIAGNOSIS — Z79899 Other long term (current) drug therapy: Secondary | ICD-10-CM | POA: Diagnosis not present

## 2017-08-14 DIAGNOSIS — I129 Hypertensive chronic kidney disease with stage 1 through stage 4 chronic kidney disease, or unspecified chronic kidney disease: Secondary | ICD-10-CM | POA: Diagnosis not present

## 2017-08-14 DIAGNOSIS — N183 Chronic kidney disease, stage 3 (moderate): Secondary | ICD-10-CM | POA: Diagnosis not present

## 2017-08-14 DIAGNOSIS — Z7902 Long term (current) use of antithrombotics/antiplatelets: Secondary | ICD-10-CM | POA: Diagnosis not present

## 2017-08-14 DIAGNOSIS — Z8673 Personal history of transient ischemic attack (TIA), and cerebral infarction without residual deficits: Secondary | ICD-10-CM | POA: Diagnosis not present

## 2017-08-14 DIAGNOSIS — D510 Vitamin B12 deficiency anemia due to intrinsic factor deficiency: Secondary | ICD-10-CM | POA: Diagnosis not present

## 2017-08-20 ENCOUNTER — Emergency Department (HOSPITAL_COMMUNITY): Payer: Medicare Other

## 2017-08-20 ENCOUNTER — Observation Stay (HOSPITAL_COMMUNITY)
Admission: EM | Admit: 2017-08-20 | Discharge: 2017-08-21 | Disposition: A | Discharge status: Skilled Nursing Facility | Payer: Medicare Other | Attending: Internal Medicine | Admitting: Internal Medicine

## 2017-08-20 ENCOUNTER — Encounter (HOSPITAL_COMMUNITY): Payer: Self-pay | Admitting: Radiology

## 2017-08-20 DIAGNOSIS — Z66 Do not resuscitate: Secondary | ICD-10-CM | POA: Diagnosis not present

## 2017-08-20 DIAGNOSIS — D510 Vitamin B12 deficiency anemia due to intrinsic factor deficiency: Principal | ICD-10-CM | POA: Diagnosis present

## 2017-08-20 DIAGNOSIS — Z950 Presence of cardiac pacemaker: Secondary | ICD-10-CM | POA: Insufficient documentation

## 2017-08-20 DIAGNOSIS — R739 Hyperglycemia, unspecified: Secondary | ICD-10-CM | POA: Diagnosis present

## 2017-08-20 DIAGNOSIS — I672 Cerebral atherosclerosis: Secondary | ICD-10-CM | POA: Insufficient documentation

## 2017-08-20 DIAGNOSIS — N184 Chronic kidney disease, stage 4 (severe): Secondary | ICD-10-CM | POA: Diagnosis present

## 2017-08-20 DIAGNOSIS — I872 Venous insufficiency (chronic) (peripheral): Secondary | ICD-10-CM | POA: Diagnosis present

## 2017-08-20 DIAGNOSIS — I517 Cardiomegaly: Secondary | ICD-10-CM | POA: Diagnosis not present

## 2017-08-20 DIAGNOSIS — D631 Anemia in chronic kidney disease: Secondary | ICD-10-CM | POA: Diagnosis not present

## 2017-08-20 DIAGNOSIS — R7989 Other specified abnormal findings of blood chemistry: Secondary | ICD-10-CM | POA: Diagnosis present

## 2017-08-20 DIAGNOSIS — R001 Bradycardia, unspecified: Secondary | ICD-10-CM | POA: Diagnosis not present

## 2017-08-20 DIAGNOSIS — I129 Hypertensive chronic kidney disease with stage 1 through stage 4 chronic kidney disease, or unspecified chronic kidney disease: Secondary | ICD-10-CM | POA: Diagnosis not present

## 2017-08-20 DIAGNOSIS — E7800 Pure hypercholesterolemia, unspecified: Secondary | ICD-10-CM | POA: Diagnosis present

## 2017-08-20 DIAGNOSIS — R0989 Other specified symptoms and signs involving the circulatory and respiratory systems: Secondary | ICD-10-CM | POA: Diagnosis not present

## 2017-08-20 DIAGNOSIS — Z79899 Other long term (current) drug therapy: Secondary | ICD-10-CM | POA: Insufficient documentation

## 2017-08-20 DIAGNOSIS — R404 Transient alteration of awareness: Secondary | ICD-10-CM | POA: Diagnosis not present

## 2017-08-20 DIAGNOSIS — M8580 Other specified disorders of bone density and structure, unspecified site: Secondary | ICD-10-CM | POA: Diagnosis not present

## 2017-08-20 DIAGNOSIS — F0390 Unspecified dementia without behavioral disturbance: Secondary | ICD-10-CM | POA: Insufficient documentation

## 2017-08-20 DIAGNOSIS — R946 Abnormal results of thyroid function studies: Secondary | ICD-10-CM | POA: Diagnosis not present

## 2017-08-20 DIAGNOSIS — R531 Weakness: Secondary | ICD-10-CM | POA: Diagnosis not present

## 2017-08-20 DIAGNOSIS — Z8673 Personal history of transient ischemic attack (TIA), and cerebral infarction without residual deficits: Secondary | ICD-10-CM | POA: Diagnosis not present

## 2017-08-20 DIAGNOSIS — E875 Hyperkalemia: Secondary | ICD-10-CM | POA: Diagnosis not present

## 2017-08-20 DIAGNOSIS — R2689 Other abnormalities of gait and mobility: Secondary | ICD-10-CM | POA: Diagnosis not present

## 2017-08-20 DIAGNOSIS — H9113 Presbycusis, bilateral: Secondary | ICD-10-CM | POA: Diagnosis present

## 2017-08-20 DIAGNOSIS — I441 Atrioventricular block, second degree: Secondary | ICD-10-CM | POA: Insufficient documentation

## 2017-08-20 HISTORY — DX: Unspecified right bundle-branch block: I45.10

## 2017-08-20 LAB — COMPREHENSIVE METABOLIC PANEL
ALBUMIN: 2.9 g/dL — AB (ref 3.5–5.0)
ALT: 5 U/L — AB (ref 14–54)
AST: 18 U/L (ref 15–41)
Alkaline Phosphatase: 81 U/L (ref 38–126)
Anion gap: 8 (ref 5–15)
BILIRUBIN TOTAL: 0.5 mg/dL (ref 0.3–1.2)
BUN: 67 mg/dL — AB (ref 6–20)
CHLORIDE: 111 mmol/L (ref 101–111)
CO2: 20 mmol/L — ABNORMAL LOW (ref 22–32)
CREATININE: 2.15 mg/dL — AB (ref 0.44–1.00)
Calcium: 9.1 mg/dL (ref 8.9–10.3)
GFR calc Af Amer: 21 mL/min — ABNORMAL LOW (ref >60)
GFR, EST NON AFRICAN AMERICAN: 18 mL/min — AB (ref >60)
GLUCOSE: 139 mg/dL — AB (ref 65–99)
Potassium: 5.3 mmol/L — ABNORMAL HIGH (ref 3.5–5.1)
Sodium: 139 mmol/L (ref 135–145)
TOTAL PROTEIN: 5.8 g/dL — AB (ref 6.5–8.1)

## 2017-08-20 LAB — CBC
HCT: 30.3 % — ABNORMAL LOW (ref 36.0–46.0)
HEMOGLOBIN: 9.7 g/dL — AB (ref 12.0–15.0)
MCH: 32.2 pg (ref 26.0–34.0)
MCHC: 32 g/dL (ref 30.0–36.0)
MCV: 100.7 fL — AB (ref 78.0–100.0)
Platelets: 187 10*3/uL (ref 150–400)
RBC: 3.01 MIL/uL — AB (ref 3.87–5.11)
RDW: 14.6 % (ref 11.5–15.5)
WBC: 5.4 10*3/uL (ref 4.0–10.5)

## 2017-08-20 LAB — CREATININE, SERUM
Creatinine, Ser: 2.08 mg/dL — ABNORMAL HIGH (ref 0.44–1.00)
GFR calc Af Amer: 22 mL/min — ABNORMAL LOW (ref >60)
GFR calc non Af Amer: 19 mL/min — ABNORMAL LOW (ref >60)

## 2017-08-20 LAB — I-STAT CHEM 8, ED
BUN: 58 mg/dL — AB (ref 6–20)
CHLORIDE: 110 mmol/L (ref 101–111)
CREATININE: 2.1 mg/dL — AB (ref 0.44–1.00)
Calcium, Ion: 1.24 mmol/L (ref 1.15–1.40)
Glucose, Bld: 133 mg/dL — ABNORMAL HIGH (ref 65–99)
HEMATOCRIT: 27 % — AB (ref 36.0–46.0)
Hemoglobin: 9.2 g/dL — ABNORMAL LOW (ref 12.0–15.0)
POTASSIUM: 5.3 mmol/L — AB (ref 3.5–5.1)
Sodium: 140 mmol/L (ref 135–145)
TCO2: 22 mmol/L (ref 22–32)

## 2017-08-20 LAB — VALPROIC ACID LEVEL: VALPROIC ACID LVL: 11 ug/mL — AB (ref 50.0–100.0)

## 2017-08-20 LAB — URINALYSIS, ROUTINE W REFLEX MICROSCOPIC
Bilirubin Urine: NEGATIVE
GLUCOSE, UA: NEGATIVE mg/dL
HGB URINE DIPSTICK: NEGATIVE
Ketones, ur: NEGATIVE mg/dL
Nitrite: NEGATIVE
PH: 5 (ref 5.0–8.0)
Protein, ur: NEGATIVE mg/dL
SPECIFIC GRAVITY, URINE: 1.01 (ref 1.005–1.030)

## 2017-08-20 LAB — CBC WITH DIFFERENTIAL/PLATELET
Basophils Absolute: 0 10*3/uL (ref 0.0–0.1)
Basophils Relative: 0 %
EOS ABS: 0.1 10*3/uL (ref 0.0–0.7)
EOS PCT: 2 %
HCT: 27 % — ABNORMAL LOW (ref 36.0–46.0)
Hemoglobin: 8.7 g/dL — ABNORMAL LOW (ref 12.0–15.0)
LYMPHS ABS: 1.2 10*3/uL (ref 0.7–4.0)
Lymphocytes Relative: 23 %
MCH: 32.6 pg (ref 26.0–34.0)
MCHC: 32.2 g/dL (ref 30.0–36.0)
MCV: 101.1 fL — AB (ref 78.0–100.0)
MONO ABS: 0.3 10*3/uL (ref 0.1–1.0)
Monocytes Relative: 6 %
Neutro Abs: 3.7 10*3/uL (ref 1.7–7.7)
Neutrophils Relative %: 69 %
PLATELETS: 172 10*3/uL (ref 150–400)
RBC: 2.67 MIL/uL — ABNORMAL LOW (ref 3.87–5.11)
RDW: 14.8 % (ref 11.5–15.5)
WBC: 5.3 10*3/uL (ref 4.0–10.5)

## 2017-08-20 LAB — I-STAT TROPONIN, ED: Troponin i, poc: 0.04 ng/mL (ref 0.00–0.08)

## 2017-08-20 LAB — AMMONIA: Ammonia: 35 umol/L (ref 9–35)

## 2017-08-20 LAB — TSH: TSH: 0.206 u[IU]/mL — AB (ref 0.350–4.500)

## 2017-08-20 MED ORDER — ENOXAPARIN SODIUM 40 MG/0.4ML ~~LOC~~ SOLN
40.0000 mg | SUBCUTANEOUS | Status: DC
  Filled 2017-08-20: qty 0.4

## 2017-08-20 MED ORDER — PATIROMER SORBITEX CALCIUM 8.4 G PO PACK
8.4000 g | PACK | Freq: Every day | ORAL | Status: DC
  Filled 2017-08-20: qty 4

## 2017-08-20 MED ORDER — ONDANSETRON HCL 4 MG/2ML IJ SOLN: 4.0000 mg | Freq: Four times a day (QID) | INTRAMUSCULAR | Status: DC | PRN

## 2017-08-20 MED ORDER — CLOPIDOGREL BISULFATE 75 MG PO TABS
75.0000 mg | ORAL_TABLET | Freq: Every day | ORAL | Status: DC
  Administered 2017-08-21: 75 mg via ORAL
  Filled 2017-08-20: qty 1

## 2017-08-20 MED ORDER — ACETAMINOPHEN 325 MG PO TABS: 650.0000 mg | ORAL_TABLET | Freq: Four times a day (QID) | ORAL | Status: DC | PRN

## 2017-08-20 MED ORDER — HYDRALAZINE HCL 25 MG PO TABS
25.0000 mg | ORAL_TABLET | Freq: Three times a day (TID) | ORAL | Status: DC
  Administered 2017-08-20: 25 mg via ORAL
  Filled 2017-08-20 (×2): qty 1

## 2017-08-20 MED ORDER — ENOXAPARIN SODIUM 30 MG/0.3ML ~~LOC~~ SOLN
30.0000 mg | SUBCUTANEOUS | Status: DC
  Administered 2017-08-20: 30 mg via SUBCUTANEOUS
  Filled 2017-08-20 (×3): qty 0.3

## 2017-08-20 MED ORDER — ONDANSETRON HCL 4 MG PO TABS: 4.0000 mg | ORAL_TABLET | Freq: Four times a day (QID) | ORAL | Status: DC | PRN

## 2017-08-20 MED ORDER — ADULT MULTIVITAMIN W/MINERALS CH
1.0000 | ORAL_TABLET | Freq: Every day | ORAL | Status: DC
  Administered 2017-08-21: 1 via ORAL
  Filled 2017-08-20: qty 1

## 2017-08-20 MED ORDER — FUROSEMIDE 20 MG PO TABS
40.0000 mg | ORAL_TABLET | ORAL | Status: DC
  Administered 2017-08-21: 40 mg via ORAL
  Filled 2017-08-20 (×2): qty 2

## 2017-08-20 MED ORDER — LORATADINE 10 MG PO TABS
10.0000 mg | ORAL_TABLET | Freq: Every day | ORAL | Status: DC
Start: 2017-08-21 — End: 2017-08-21
  Administered 2017-08-21: 10 mg via ORAL
  Filled 2017-08-20: qty 1

## 2017-08-20 MED ORDER — SODIUM CHLORIDE 0.9% FLUSH
3.0000 mL | Freq: Two times a day (BID) | INTRAVENOUS | Status: DC
  Administered 2017-08-20: 3 mL via INTRAVENOUS

## 2017-08-20 MED ORDER — SODIUM POLYSTYRENE SULFONATE 15 GM/60ML PO SUSP
15.0000 g | Freq: Once | ORAL | Status: AC
  Administered 2017-08-20: 15 g via ORAL
  Filled 2017-08-20: qty 60

## 2017-08-20 MED ORDER — DIVALPROEX SODIUM 125 MG PO CSDR
125.0000 mg | DELAYED_RELEASE_CAPSULE | Freq: Every day | ORAL | Status: DC
  Administered 2017-08-20: 125 mg via ORAL
  Filled 2017-08-20 (×2): qty 1

## 2017-08-20 MED ORDER — VITAMIN C 500 MG PO TABS
500.0000 mg | ORAL_TABLET | Freq: Every day | ORAL | Status: DC
  Administered 2017-08-21: 500 mg via ORAL
  Filled 2017-08-20: qty 1

## 2017-08-20 MED ORDER — TRIAMCINOLONE ACETONIDE 0.1 % EX CREA
TOPICAL_CREAM | Freq: Every day | CUTANEOUS | Status: DC
  Administered 2017-08-21: 10:00:00 via TOPICAL
  Filled 2017-08-20: qty 15

## 2017-08-20 MED ORDER — AMMONIUM LACTATE 12 % EX LOTN
TOPICAL_LOTION | Freq: Every day | CUTANEOUS | Status: DC
  Filled 2017-08-20: qty 400

## 2017-08-20 MED ORDER — FAMOTIDINE 20 MG PO TABS
10.0000 mg | ORAL_TABLET | Freq: Every day | ORAL | Status: DC
  Administered 2017-08-21: 10 mg via ORAL
  Filled 2017-08-20: qty 1

## 2017-08-20 MED ORDER — CALCIUM GLUCONATE 10 % IV SOLN
1.0000 g | Freq: Once | INTRAVENOUS | Status: AC
  Administered 2017-08-20: 1 g via INTRAVENOUS
  Filled 2017-08-20: qty 10

## 2017-08-20 MED ORDER — LOPERAMIDE HCL 2 MG PO CAPS
2.0000 mg | ORAL_CAPSULE | Freq: Every day | ORAL | Status: DC
  Administered 2017-08-21: 2 mg via ORAL
  Filled 2017-08-20: qty 1

## 2017-08-20 MED ORDER — SODIUM CHLORIDE 0.9 % IV SOLN
INTRAVENOUS | Status: AC
  Administered 2017-08-20: 21:00:00 via INTRAVENOUS

## 2017-08-20 MED ORDER — ACETAMINOPHEN 650 MG RE SUPP: 650.0000 mg | Freq: Four times a day (QID) | RECTAL | Status: DC | PRN

## 2017-08-20 MED ORDER — TRAMADOL HCL 50 MG PO TABS: 50.0000 mg | ORAL_TABLET | Freq: Four times a day (QID) | ORAL | Status: DC | PRN

## 2017-08-20 MED ORDER — FUROSEMIDE 20 MG PO TABS
20.0000 mg | ORAL_TABLET | Freq: Every day | ORAL | Status: DC
  Administered 2017-08-20: 20 mg via ORAL

## 2017-08-20 MED ORDER — OXYBUTYNIN CHLORIDE ER 5 MG PO TB24
5.0000 mg | ORAL_TABLET | Freq: Every day | ORAL | Status: DC
  Administered 2017-08-20: 5 mg via ORAL
  Filled 2017-08-20 (×2): qty 1

## 2017-08-20 NOTE — ED Notes (Signed)
RN attempted calling both numbers for Carrie Grimes, daughter and number for Carrie Grimes, son, with no answer.

## 2017-08-20 NOTE — ED Notes (Signed)
Pharmacy messaged about unverified meds 

## 2017-08-20 NOTE — ED Notes (Signed)
Meal Tray delivered and at bedside.  

## 2017-08-20 NOTE — ED Provider Notes (Addendum)
[facility] DEPARTMENT Provider Note   CSN: [id number] Arrival date & time: 08/20/17  1322     History   Chief Complaint Chief Complaint  Patient presents with  . Fatigue    HPI Carrie Grimes is a 84 y.o. female.  HPI Level 5 caveat due to dementia and altered mental status. Patient sent in from nursing home.  Reportedly has lethargy and will keep falling asleep.  Patient will fall back asleep without stimulation.  States she is without complaints.  States she went down to get breakfast but then does not really know what happened.  Denies headache.  Does have a history of dementia.  She is on Depakote.  History of chronic kidney disease. Past Medical History:  Diagnosis Date  . Allergic rhinitis, cause unspecified   . Anemia of chronic kidney failure, stage 4 (severe) (HCC)    [facility] via nephrologist  . Cataracts, bilateral   . Cerebrovascular disease, unspecified    CDopplers 1/13 showed mild mixed plaque w/ 40-59% (low end) bilat ICA stenoses, the prev right CAE & patch angioplasty were patent  . Choledocholithiasis    12/11/14 CT--nonobstructive (42mm x 6 mm)  . Chronic renal insufficiency, stage IV (severe) (HCC)    Dr. Marval Regal. Not a candidate for renal replacement therapy.  Pt does not want this, either.  . Dementia with behavioral disturbance    depakote helpful  . Diaphragmatic hernia without mention of obstruction or gangrene   . Essential hypertension   . Hyperkalemia 06/12/2015   diminished renal excretion: as of 05/2016, kayexolate started, low K diet.  . Impaired mobility    uses walker, wheelchair  . Pernicious anemia   . Pure hypercholesterolemia   . Retinal hemorrhage of right eye   . Unspecified cerebral artery occlusion with cerebral infarction    MRI Brain 12/12 showed large remote right hemispheric infarct w/ encephalomalacia w/ prominent small vessel dis superimposed on atrophy... (residual deficits are  diminished sensation on left side, left hand grip weakness,   . Unspecified disorder resulting from impaired renal function    hyperkalemia and anemia  . Unspecified hearing loss   . Unspecified venous (peripheral) insufficiency    lasix  . Unspecified vitamin D deficiency     Patient Active Problem List   Diagnosis Date Noted  . Chronic renal insufficiency, stage IV (severe) ([place]) 10/26/2015  . Hyperkalemia 06/12/2015  . Chronic renal insufficiency, stage III (moderate) ([place]) 06/12/2015  . HTN (hypertension), benign 11/02/2013  . Venous stasis dermatitis 11/02/2013  . Urinary incontinence 09/09/2012  . Dementia with behavioral disturbance 06/26/2011  . Edema 10/23/2010  . DJD (degenerative joint disease) 10/23/2010  . ALLERGIC RHINITIS 10/08/2008  . ANEMIA 08/08/2008  . VITAMIN D DEFICIENCY 07/22/2008  . HEARING LOSS 07/22/2008  . RENAL INSUFFICIENCY 10/08/2007  . HYPERCHOLESTEROLEMIA 04/12/2007  . PERNICIOUS ANEMIA 04/12/2007  . CEREBROVASCULAR DISEASE 04/12/2007  . Chronic venous insufficiency 04/09/2007  . STROKE 02/06/2007  . HIATAL HERNIA 02/06/2007    Past Surgical History:  Procedure Laterality Date  . CAROTID ENDARTERECTOMY    . CHOLECYSTECTOMY    . ENDOSCOPIC RETROGRADE CHOLANGIOPANCREATOGRAPHY (ERCP) WITH PROPOFOL N/A 03/09/2015   Procedure: ENDOSCOPIC RETROGRADE CHOLANGIOPANCREATOGRAPHY (ERCP) WITH PROPOFOL;  Surgeon: Milus Banister, MD;  Location: [facility];  Service: Endoscopy;  Laterality: N/A;  . laser surgery on right eye  12/02/2012   Dr. Baird Cancer at [facility]     OB History   None      Home  Medications    Prior to Admission medications   Medication Sig Start Date End Date Taking? Authorizing Provider  acetaminophen (TYLENOL) 500 MG tablet Take 1,000 mg by mouth. Three times daily and as needed for pain   Yes [provider]  acetaminophen (TYLENOL) 500 MG tablet Take 500 mg by mouth every 6 (six) hours as needed.   Yes  [provider]  ammonium lactate (AMLACTIN) 12 % cream Apply topically to lower legs at bedtime every night for dry/thick skin 06/12/17  Yes McGowen, Adrian Blackwater, MD  cetirizine (ZYRTEC) 10 MG tablet Take 10 mg by mouth daily.   Yes [provider]  clopidogrel (PLAVIX) 75 MG tablet Take 1 tablet (75 mg total) by mouth daily. RESTART THIS IN 3 DAYS 03/09/15  Yes Milus Banister, MD  cyanocobalamin (,VITAMIN B-12,) 1000 MCG/ML injection Inject 1,000 mcg into the skin every 30 (thirty) days. On the 12th of every month.   Yes [provider]  divalproex (DEPAKOTE SPRINKLE) 125 MG capsule 1 tab po qhs Patient taking differently: Take 125 mg by mouth at bedtime. 1 tab po qhs 10/24/16  Yes McGowen, Adrian Blackwater, MD  fluticasone (CUTIVATE) 0.05 % cream Apply to affected pinkish rash of both lower extremities as needed 05/22/17  Yes McGowen, Adrian Blackwater, MD  furosemide (LASIX) 20 MG tablet 1 tab po every evening 11/18/16  Yes McGowen, Adrian Blackwater, MD  furosemide (LASIX) 40 MG tablet Take 1 tablet (40 mg total) by mouth daily. 11/18/16  Yes McGowen, Adrian Blackwater, MD  hydrALAZINE (APRESOLINE) 25 MG tablet Take 1 tablet (25 mg total) by mouth 3 (three) times daily. 06/25/16  Yes McGowen, Adrian Blackwater, MD  loperamide (IMODIUM A-D) 2 MG tablet Take 2 mg by mouth daily.    Yes [provider]  Multiple Vitamin (MULTIVITAMIN) capsule Take 1 capsule by mouth daily.     Yes [provider]  oxybutynin (DITROPAN-XL) 5 MG 24 hr tablet Take 5 mg by mouth at bedtime.  05/13/17  Yes [provider]  patiromer (VELTASSA) 8.4 g packet Take 1 packet (8.4 g total) by mouth daily. Do not give within 3 hours of taking any other medications.  May give with OR without food. 06/17/17  Yes McGowen, Adrian Blackwater, MD  ranitidine (ZANTAC) 75 MG tablet Take 75 mg by mouth daily.   Yes [provider]  vitamin C (ASCORBIC ACID) 500 MG tablet Take 500 mg by mouth daily.    Yes [provider]     Family History Family History  Problem Relation Age of Onset  . Heart disease Mother   . Heart disease Father     Social History Social History   Tobacco Use  . Smoking status: Never Smoker  . Smokeless tobacco: Never Used  Substance Use Topics  . Alcohol use: No  . Drug use: No     Allergies   Procaine hcl   Review of Systems Review of Systems  Unable to perform ROS: Dementia     Physical Exam Updated Vital Signs BP (!) 168/77   Pulse (!) 37   Temp 97.6 F (36.4 C) (Oral)   Resp 14   SpO2 100%   Physical Exam  Constitutional: She appears well-developed.  HENT:  Head: Normocephalic.  Eyes: Pupils are equal, round, and reactive to light.  Neck: Neck supple.  Cardiovascular:  Bradycardia  Pulmonary/Chest: She has no wheezes. She has no rales.  Abdominal: She exhibits no distension.  Musculoskeletal: She exhibits  edema.  Moderate to severe edema of left lower extremity.  Less edema on contralateral side.  Neurological:  patient is awake but somewhat somnolent.  Will arouse to stimulation and answer questions.  Without complaints and will move her extremities.  Skin: Skin is warm.     ED Treatments / Results  Labs (all labs ordered are listed, but only abnormal results are displayed) Labs Reviewed  COMPREHENSIVE METABOLIC PANEL - Abnormal; Notable for the following components:      Result Value   Potassium 5.3 (*)    CO2 20 (*)    Glucose, Bld 139 (*)    BUN 67 (*)    Creatinine, Ser 2.15 (*)    Total Protein 5.8 (*)    Albumin 2.9 (*)    ALT 5 (*)    GFR calc non Af Amer 18 (*)    GFR calc Af Amer 21 (*)    All other components within normal limits  CBC WITH DIFFERENTIAL/PLATELET - Abnormal; Notable for the following components:   RBC 2.67 (*)    Hemoglobin 8.7 (*)    HCT 27.0 (*)    MCV 101.1 (*)    All other components within normal limits  URINALYSIS, ROUTINE W REFLEX MICROSCOPIC - Abnormal; Notable for the following components:    Color, Urine STRAW (*)    Leukocytes, UA TRACE (*)    Bacteria, UA MANY (*)    All other components within normal limits  VALPROIC ACID LEVEL - Abnormal; Notable for the following components:   Valproic Acid Lvl 11 (*)    All other components within normal limits  I-STAT CHEM 8, ED - Abnormal; Notable for the following components:   Potassium 5.3 (*)    BUN 58 (*)    Creatinine, Ser 2.10 (*)    Glucose, Bld 133 (*)    Hemoglobin 9.2 (*)    HCT 27.0 (*)    All other components within normal limits  AMMONIA  I-STAT TROPONIN, ED    EKG EKG Interpretation  Date/Time:  Wednesday August 20 2017 13:36:12 EDT Ventricular Rate:  47 PR Interval:    QRS Duration: 151 QT Interval:  490 QTC Calculation: 434 R Axis:   22 Text Interpretation:  Slow sinus arrhythmia Prolonged PR interval Right bundle branch block LVH with secondary repolarization abnormality rate decreased from prior Confirmed by Davonna Belling [id number]) on 08/20/2017 1:53:08 PM   Radiology Ct Head Wo Contrast  Result Date: 08/20/2017 CLINICAL DATA:  Lethargic, altered mental status, hypertension EXAM: CT HEAD WITHOUT CONTRAST TECHNIQUE: Contiguous axial images were obtained from the base of the skull through the vertex without intravenous contrast. COMPARISON:  09/22/2014 FINDINGS: Brain: Stable cystic encephalomalacia in the right parietal temporal lobe from a remote infarct. Extensive brain atrophy and chronic white matter microvascular ischemic changes throughout both cerebral hemispheres. No acute intracranial hemorrhage, mass lesion, new infarction midline shift, herniation, hydrocephalus, or extra-axial fluid collection. No focal mass effect or edema. Cisterns are patent. Cerebellar atrophy as well. Vascular: Intracranial atherosclerosis noted.  No hyperdense vessel. Skull: Normal. Negative for fracture or focal lesion. Sinuses/Orbits: Chronic mucosal thickening in the maxillary sinuses. Other sinuses are clear. Orbits are  symmetric. Other: None. IMPRESSION: Stable brain atrophy, chronic white matter microvascular changes, and remote right cerebral infarct with encephalomalacia as above. No significant interval change or acute process by noncontrast CT. Electronically Signed   By: Jerilynn Mages.  Shick M.D.   On: 08/20/2017 17:29   Dg Chest Portable 1 View  Result  Date: 08/20/2017 CLINICAL DATA:  Lethargy. EXAM: PORTABLE CHEST 1 VIEW COMPARISON:  12/11/2014. FINDINGS: Cardiomegaly. Tortuous aorta. Mild vascular congestion. No consolidation or edema. Osteopenia. Similar appearance to priors. IMPRESSION: Cardiomegaly with mild vascular congestion. No consolidation or edema. Electronically Signed   By: Staci Righter M.D.   On: 08/20/2017 14:26    Procedures Procedures (including critical care time)  Medications Ordered in ED Medications  calcium gluconate inj 10% (1 g) URGENT USE ONLY! (has no administration in time range)     Initial Impression / Assessment and Plan / ED Course  I have reviewed the triage vital signs and the nursing notes.  Pertinent labs & imaging results that were available during my care of the patient were reviewed by me and considered in my medical decision making (see chart for details).     Patient sent from nursing home for decreased mental status.  Has a bradycardia that appears to be sinus.  He is not on any medicines that would necessarily give her bradycardia.  No beta-blockers or calcium channel blockers.  Does have a mild hyperkalemia with potassium of 5.3.  She does however have maintained blood pressure.  Otherwise nonfocal exam except gets somewhat sleepy again after talking.  However with the bradycardia though it up down to 40s and may be even 30 I think patient would potentially benefit from monitoring.  I will give calcium to see if that will help with the rate.  Will admit to hospitalist for management and likely cardiology consult.  CRITICAL CARE Performed by: Davonna Belling Total  critical care time: 30 minutes Critical care time was exclusive of separately billable procedures and treating other patients. Critical care was necessary to treat or prevent imminent or life-threatening deterioration. Critical care was time spent personally by me on the following activities: development of treatment plan with patient and/or surrogate as well as nursing, discussions with consultants, evaluation of patient's response to treatment, examination of patient, obtaining history from patient or surrogate, ordering and performing treatments and interventions, ordering and review of laboratory studies, ordering and review of radiographic studies, pulse oximetry and re-evaluation of patient's condition.  Final Clinical Impressions(s) / ED Diagnoses   Final diagnoses:  Sinus bradycardia  Hyperkalemia    ED Discharge Orders    None       Davonna Belling, MD 08/20/17 Donzetta Kohut    Davonna Belling, MD 09/04/17 [phone]

## 2017-08-20 NOTE — ED Notes (Signed)
RN called Nanine Means to see if there were any other numbers for pt. Will call Guinevere Scarlet, grandson at [phone]

## 2017-08-20 NOTE — ED Notes (Signed)
Grandson present at bedside.

## 2017-08-20 NOTE — Consult Note (Addendum)
Cardiology Consult    Patient ID: Carrie Grimes MRN: [id number], DOB/AGE: 07/21/1920   Admit date: 08/20/2017 Date of Consult: 08/20/2017  Primary Physician: Tammi Sou, MD Primary Cardiologist: No primary care provider on file. Requesting Provider: Legrand Pitts, MD  Patient Profile    Carrie Grimes is a 84 y.o. female with a history of CKD IV, HTN, hyperkalemia, anemia (receives aranesp), dementia, chronic venous stasis and lower ext edema, hearing loss, and RBBB , who is being seen today for the evaluation of bradycardia and lethargy at the request of Dr. Evangeline Gula.  Past Medical History   Past Medical History:  Diagnosis Date  . Allergic rhinitis, cause unspecified   . Anemia of chronic kidney failure, stage 4 (severe) (HCC)    Aranesp via nephrologist  . Cataracts, bilateral   . Cerebrovascular disease, unspecified    CDopplers 1/13 showed mild mixed plaque w/ 40-59% (low end) bilat ICA stenoses, the prev right CAE & patch angioplasty were patent  . Choledocholithiasis    12/11/14 CT--nonobstructive (3mm x 6 mm)  . Chronic renal insufficiency, stage IV (severe) (HCC)    Dr. Marval Regal. Not a candidate for renal replacement therapy.  Pt does not want this, either.  . Dementia with behavioral disturbance    depakote helpful  . Diaphragmatic hernia without mention of obstruction or gangrene   . Essential hypertension   . Hyperkalemia 06/12/2015   diminished renal excretion: as of 05/2016, kayexolate started, low K diet.  . Impaired mobility    uses walker, wheelchair  . Pernicious anemia   . Pure hypercholesterolemia   . RBBB   . Retinal hemorrhage of right eye   . Unspecified cerebral artery occlusion with cerebral infarction    MRI Brain 12/12 showed large remote right hemispheric infarct w/ encephalomalacia w/ prominent small vessel dis superimposed on atrophy... (residual deficits are diminished sensation on left side, left hand grip weakness,   .  Unspecified disorder resulting from impaired renal function    hyperkalemia and anemia  . Unspecified hearing loss   . Unspecified venous (peripheral) insufficiency    lasix  . Unspecified vitamin D deficiency     Past Surgical History:  Procedure Laterality Date  . CAROTID ENDARTERECTOMY    . CHOLECYSTECTOMY    . ENDOSCOPIC RETROGRADE CHOLANGIOPANCREATOGRAPHY (ERCP) WITH PROPOFOL N/A 03/09/2015   Procedure: ENDOSCOPIC RETROGRADE CHOLANGIOPANCREATOGRAPHY (ERCP) WITH PROPOFOL;  Surgeon: Milus Banister, MD;  Location: [facility];  Service: Endoscopy;  Laterality: N/A;  . laser surgery on right eye  12/02/2012   Dr[facility]     Allergies  Allergies  Allergen Reactions  . Procaine Hcl Other (See Comments)    REACTION: pt states reaction to shot at dentist office w/ tachycardia \\T \ SOB (? if really from combination w/epi)    History of Present Illness    84 y.o. female with a history of CKD IV, HTN, hyperkalemia, anemia (receives aranesp), dementia, chronic venous stasis and lower ext edema, hearing loss, and RBBB.  She also has a history of presyncope and syncope in 2014 with an echo at that time showing normal LV function with an EF of 60 to 65% and grade 1 diastolic dysfunction.  Event monitoring showed sinus rhythm without arrhythmia.  She otherwise has no cardiac history.  She lives at [facility] and is followed closely by her primary care provider.  Patient reports that over the past few weeks, she has been falling asleep rather easily, often  occurring during or just after meals.  She says she has good energy when she is up and about and using her walker but if she is sitting down, she has been prone to falling asleep, which is unusual for her.  Today, she went to the cafeteria and apparently fell asleep while eating and was very lethargic.  This was concerning to staff and she was taken into the emergency department for evaluation.  Here, she was  hypertensive with pressures in the 170s but also bradycardic with rates in the 30s to 40s, with intermittent Wenckebach and 3-second pauses.  Lab work revealed stable creatinine elevation at 2.10 with a potassium of 5.3.  She is chronically anemic and is 9.7/30.3 today.  Patient is currently sitting up and very conversive.  She is alert and oriented and without any active complaints.  Heart rates remain in the 40s to 50s with stable blood pressures.  She denies any prior history of chest pain.  She has not had any recent presyncope or syncope.  She does have chronic dyspnea on exertion.  Of note, she also has chronic lower extremity edema which has been followed by her primary care physician closely.  She does take Lasix and has previously worn compression hose.  This has previously been documented as bilateral.  Today, she has significant left lower extremity edema though her right leg looks pretty good.  Inpatient Medications    . ammonium lactate   Topical QHS  . [START ON 08/21/2017] clopidogrel  75 mg Oral Daily  . divalproex  125 mg Oral QHS  . enoxaparin (LOVENOX) injection  40 mg Subcutaneous Q24H  . [START ON 08/21/2017] famotidine  10 mg Oral Daily  . fluticasone   Topical Daily  . furosemide  20 mg Oral q1800  . furosemide  40 mg Oral Q24H  . hydrALAZINE  25 mg Oral TID  . [START ON 08/21/2017] loperamide  2 mg Oral Daily  . [START ON 08/21/2017] loratadine  10 mg Oral Daily  . [START ON 08/21/2017] multivitamin with minerals  1 tablet Oral Daily  . oxybutynin  5 mg Oral QHS  . [START ON 08/21/2017] patiromer  8.4 g Oral Daily  . sodium chloride flush  3 mL Intravenous Q12H  . [START ON 08/21/2017] vitamin C  500 mg Oral Daily    Family History    Family History  Problem Relation Age of Onset  . Heart disease Mother   . Heart disease Father    indicated that her mother is deceased. She indicated that her father is deceased.   Social History    Social History   Socioeconomic  History  . Marital status: Widowed    Spouse name: Not on file  . Number of children: Not on file  . Years of education: Not on file  . Highest education level: Not on file  Occupational History  . Not on file  Social Needs  . Financial resource strain: Not on file  . Food insecurity:    Worry: Not on file    Inability: Not on file  . Transportation needs:    Medical: Not on file    Non-medical: Not on file  Tobacco Use  . Smoking status: Never Smoker  . Smokeless tobacco: Never Used  Substance and Sexual Activity  . Alcohol use: No  . Drug use: No  . Sexual activity: Not on file  Lifestyle  . Physical activity:    Days per week: Not on file  Minutes per session: Not on file  . Stress: Not on file  Relationships  . Social connections:    Talks on phone: Not on file    Gets together: Not on file    Attends religious service: Not on file    Active member of club or organization: Not on file    Attends meetings of clubs or organizations: Not on file    Relationship status: Not on file  . Intimate partner violence:    Fear of current or ex partner: Not on file    Emotionally abused: Not on file    Physically abused: Not on file    Forced sexual activity: Not on file  Other Topics Concern  . Not on file  Social History Narrative   Lives in [facility].   HC POA is daughter--Brenda Tresa Endo, and/or son Billee Cashing.   Ambulates with rolling walker and manual wheelchair.     Review of Systems    General:  +++ fatigue/lethargy/malaise.  No chills, fever, night sweats or weight changes.  Cardiovascular:  No chest pain, +++ chronic dyspnea on exertion, no edema, orthopnea, palpitations, paroxysmal nocturnal dyspnea. Dermatological: No lesions/masses.  LLE red. Respiratory: No cough, dyspnea Urologic: No hematuria, dysuria Abdominal:   No nausea, vomiting, diarrhea, bright red blood per rectum, melena, or hematemesis Neurologic:  +++ difficulty seeing  and chronic hearing loss/HOH.  +++ recent lethargy/somnolence.  No changes in mental status. All other systems reviewed and are otherwise negative except as noted above.  Physical Exam    Blood pressure (!) 170/81, pulse (!) 43, temperature 97.6 F (36.4 C), temperature source Oral, resp. rate 20, SpO2 99 %.  General: Pleasant, NAD.  Talkative. Psych: Normal affect. Neuro: Alert and oriented X 3. Moves all extremities spontaneously. HEENT: Normal  Neck: Supple without bruits or JVD. Lungs:  Resp regular and unlabored, diminished breath sounds bilaterally. Heart: Irregular, distant, 1/6 systolic murmur at the left upper sternal border, no s3, s4. Abdomen: Soft, non-tender, non-distended, BS + x 4.  Extremities: No clubbing, cyanosis.  Trace right malleolar edema with 2-3+ left lower leg swelling to the calf associated with erythema just above the malleolus. PT/Radials 1+ and equal bilaterally.  Labs    Troponin [facility] of Care Test) Recent Labs    08/20/17 1407  TROPIPOC 0.04   Lab Results  Component Value Date   WBC 5.4 08/20/2017   HGB 9.7 (L) 08/20/2017   HCT 30.3 (L) 08/20/2017   MCV 100.7 (H) 08/20/2017   PLT 187 08/20/2017    Recent Labs  Lab 08/20/17 1347 08/20/17 1409  NA 139 140  K 5.3* 5.3*  CL 111 110  CO2 20*  --   BUN 67* 58*  CREATININE 2.15* 2.10*  CALCIUM 9.1  --   PROT 5.8*  --   BILITOT 0.5  --   ALKPHOS 81  --   ALT 5*  --   AST 18  --   GLUCOSE 139* 133*   Lab Results  Component Value Date   CHOL 133 12/02/2012   HDL 42.60 12/02/2012   LDLCALC 64 12/02/2012   TRIG 132.0 12/02/2012    Radiology Studies    Ct Head Wo Contrast  Result Date: 08/20/2017 CLINICAL DATA:  Lethargic, altered mental status, hypertension EXAM: CT HEAD WITHOUT CONTRAST TECHNIQUE: Contiguous axial images were obtained from the base of the skull through the vertex without intravenous contrast. COMPARISON:  09/22/2014 FINDINGS: Brain: Stable cystic encephalomalacia in  the  right parietal temporal lobe from a remote infarct. Extensive brain atrophy and chronic white matter microvascular ischemic changes throughout both cerebral hemispheres. No acute intracranial hemorrhage, mass lesion, new infarction midline shift, herniation, hydrocephalus, or extra-axial fluid collection. No focal mass effect or edema. Cisterns are patent. Cerebellar atrophy as well. Vascular: Intracranial atherosclerosis noted.  No hyperdense vessel. Skull: Normal. Negative for fracture or focal lesion. Sinuses/Orbits: Chronic mucosal thickening in the maxillary sinuses. Other sinuses are clear. Orbits are symmetric. Other: None. IMPRESSION: Stable brain atrophy, chronic white matter microvascular changes, and remote right cerebral infarct with encephalomalacia as above. No significant interval change or acute process by noncontrast CT. Electronically Signed   By: Jerilynn Mages.  Shick M.D.   On: 08/20/2017 17:29   Dg Chest Portable 1 View  Result Date: 08/20/2017 CLINICAL DATA:  Lethargy. EXAM: PORTABLE CHEST 1 VIEW COMPARISON:  12/11/2014. FINDINGS: Cardiomegaly. Tortuous aorta. Mild vascular congestion. No consolidation or edema. Osteopenia. Similar appearance to priors. IMPRESSION: Cardiomegaly with mild vascular congestion. No consolidation or edema. Electronically Signed   By: Staci Righter M.D.   On: 08/20/2017 14:26    ECG & Cardiac Imaging    Sinus arrhythmia, 1st deg AVB, RBBB (old) Tele - sinus brady w/ 1st deg avb and intermittent Wenckebach and pauses of up to 3 secs.   Assessment & Plan    1.  Bradycardia with intermittent Mobitz 1 heart block and pauses: Patient presented with a several week history of lethargy and somnolence noted almost exclusively while sitting and frequently during or after meals.  This recurred today prompting presentation to the emergency department.  Here, has been bradycardic with rates in the 30s to low 50s, with intermittent winky block and pauses up to 3 seconds.   This has largely been asymptomatic and she is hemodynamically stable.  She does not appear to be on any medications which might be contributing to bradycardia.  Her potassium is mildly elevated at 5.3 in the setting of stage IV chronic kidney disease.  Recommend correction of potassium as this may be contributing to bradycardia.  Continue to avoid any AV nodal blocking agents.  Follow on telemetry tonight and we will reevaluate in the a.m.  2.  Left lower extremity edema: Patient with a history of chronic venous stasis and bilateral lower extremity edema for which, she takes Lasix.  As recently as February, she was noted to have significant bilateral lower extremity swelling.  Today, she has only trace right lower extremity edema and significant left lower extremity edema along with erythema of the ankle.  No palpable cords or tenderness.  I recommend a left lower extremity ultrasound to rule out DVT and consider treatment for possible cellulitis of the left lower extremity.  She is currently afebrile with a normal white count.  3.  Essential hypertension: Blood pressure elevated in the emergency department.  She is on hydralazine and Lasix at home.  Continue and titrate hydralazine as necessary.  Avoid AV nodal blocking agents.  4.  Macrocytic/pernicious anemia: Relatively stable.  She receives Aranesp injections in the setting of stage IV kidney disease.  5.  Hyperkalemia: Mild with potassium of 5.3.  Recommend Kayexalate to normalize potassium in the setting of ongoing bradycardia.  6.  Stage IV chronic kidney disease: Relatively stable.  Signed, Murray Hodgkins, NP 08/20/2017, 8:14 PM  For questions or updates, please contact   Please consult [email or web address] for contact info under Cardiology/STEMI.   Patient seen and examined.  History is difficult as she is  quite talkative and distracted.  She has not had any syncope and is still able to go to the dining hall.  She was brought in because of  excessive sleepiness and bradycardia.  She has never had syncope.  She does have some mild dyspnea when she walks.  She does have asymmetric swelling in her left leg and agree with the need for a venous Doppler.  At this time she has bradycardia associated with normal blood pressure.  She would not appear to meet criteria for placement of a pacemaker at this point in time.  Agree with blood pressure control and correction of hyperkalemia.  Nothing to add to examination above which I agree with.  Kerry Hough MD [facility] 8:52 PM

## 2017-08-20 NOTE — ED Notes (Signed)
Pt rouses to verbal stimuli and converses but then returns back to sleep

## 2017-08-20 NOTE — ED Notes (Signed)
Ordered meal tray. 

## 2017-08-20 NOTE — ED Notes (Addendum)
Pt keeps asking about her family and wanting her Nurse and Tech to call them for her, saying that they work upstairs at [facility] in the [place]. Pt has asked several times to go to the bathroom even with the purwick in. The purwick, however, is working because pts suction canister is half way full with urine.

## 2017-08-20 NOTE — ED Notes (Signed)
Portable xray on the way

## 2017-08-20 NOTE — ED Triage Notes (Signed)
At 0900 staff at [facility] checked on her. She was lethargic but at baseline mental status. Slept on ride over. DNR. Afib rate 30-60. Difficulty hearing.

## 2017-08-20 NOTE — H&P (Signed)
History and Physical    Carrie Grimes FGH:[id number] DOB: 03-19-1921 DOA: 08/20/2017  PCP: Tammi Sou, MD  Patient coming from: Assisted living facility via EMS  I have personally briefly reviewed patient's old medical records in [place]  Chief Complaint: Lethargy and altered mental status with heart rate bradycardic  HPI: Carrie Grimes is a 84 y.o. female with medical history significant for stroke, status post right carotid endarterectomy, dyslipidemia, chronic venous insufficiency, chronic kidney disease stage IV, chronic venous insufficiency, and pernicious anemia as well as dementia who presents to the emergency department being sent in from her assisted living facility due to lethargy and fatigue.  EMS reports that she had a rate of 30 to 60 bpm on route to the hospital.  Initially upon arrival she was responsive to verbal stimuli she would converse and then fall right back to sleep.  Patient denies any complaints but reports to me that she has been very sleepy lately and is wondering if some of her medications may have brought this on.  She says she remembers going to breakfast but thinks she may have passed out because she does not remember anything since then.  Staff did not report any syncope but rather that she was lethargic and unable to stay awake.  She has no history of [place] mishaps or recent complaints that would account for her recent increase in lethargy.  She denies any headaches.  She takes Depakote but her levels in the emergency department were low.  He does have chronic kidney disease with a creatinine of approximately 2 which is at her baseline.  In the emergency department she was noted to have a sustained heart rate in the 30s to 40s and I was asked to admit the patient for bradycardia.  History is severely limited by her dementia and inability to give any cogent information.  Review of Systems: Level 5 caveat for dementia  Past Medical  History:  Diagnosis Date  . Allergic rhinitis, cause unspecified   . Anemia of chronic kidney failure, stage 4 (severe) (HCC)    [facility] via nephrologist  . Cataracts, bilateral   . Cerebrovascular disease, unspecified    CDopplers 1/13 showed mild mixed plaque w/ 40-59% (low end) bilat ICA stenoses, the prev right CAE & patch angioplasty were patent  . Choledocholithiasis    12/11/14 CT--nonobstructive (73mm x 6 mm)  . Chronic renal insufficiency, stage IV (severe) (HCC)    Dr. Marval Regal. Not a candidate for renal replacement therapy.  Pt does not want this, either.  . Dementia with behavioral disturbance    depakote helpful  . Diaphragmatic hernia without mention of obstruction or gangrene   . Essential hypertension   . Hyperkalemia 06/12/2015   diminished renal excretion: as of 05/2016, kayexolate started, low K diet.  . Impaired mobility    uses walker, wheelchair  . Pernicious anemia   . Pure hypercholesterolemia   . RBBB   . Retinal hemorrhage of right eye   . Unspecified cerebral artery occlusion with cerebral infarction    MRI Brain 12/12 showed large remote right hemispheric infarct w/ encephalomalacia w/ prominent small vessel dis superimposed on atrophy... (residual deficits are diminished sensation on left side, left hand grip weakness,   . Unspecified disorder resulting from impaired renal function    hyperkalemia and anemia  . Unspecified hearing loss   . Unspecified venous (peripheral) insufficiency    lasix  . Unspecified vitamin D deficiency     Past  Surgical History:  Procedure Laterality Date  . CAROTID ENDARTERECTOMY    . CHOLECYSTECTOMY    . ENDOSCOPIC RETROGRADE CHOLANGIOPANCREATOGRAPHY (ERCP) WITH PROPOFOL N/A 03/09/2015   Procedure: ENDOSCOPIC RETROGRADE CHOLANGIOPANCREATOGRAPHY (ERCP) WITH PROPOFOL;  Surgeon: Milus Banister, MD;  Location: [facility];  Service: Endoscopy;  Laterality: N/A;  . laser surgery on right eye  12/02/2012   Dr. [facility]aird [facility] at  [facility]     reports that she has never smoked. She has never used smokeless tobacco. She reports that she does not drink alcohol or use drugs.  Allergies  Allergen Reactions  . Procaine Hcl Other (See Comments)    REACTION: pt states reaction to shot at dentist office w/ tachycardia \\T \ SOB (? if really from combination w/epi)    Family History  Problem Relation Age of Onset  . Heart disease Mother   . Heart disease Father     Prior to Admission medications   Medication Sig Start Date End Date Taking? Authorizing Provider  acetaminophen (TYLENOL) 500 MG tablet Take 1,000 mg by mouth. Three times daily and as needed for pain   Yes [provider]  acetaminophen (TYLENOL) 500 MG tablet Take 500 mg by mouth every 6 (six) hours as needed.   Yes [provider]  ammonium lactate (AMLACTIN) 12 % cream Apply topically to lower legs at bedtime every night for dry/thick skin 06/12/17  Yes McGowen, Adrian Blackwater, MD  cetirizine (ZYRTEC) 10 MG tablet Take 10 mg by mouth daily.   Yes [provider]  clopidogrel (PLAVIX) 75 MG tablet Take 1 tablet (75 mg total) by mouth daily. RESTART THIS IN 3 DAYS 03/09/15  Yes Milus Banister, MD  cyanocobalamin (,VITAMIN B-12,) 1000 MCG/ML injection Inject 1,000 mcg into the skin every 30 (thirty) days. On the 12th of every month.   Yes [provider]  divalproex (DEPAKOTE SPRINKLE) 125 MG capsule 1 tab po qhs Patient taking differently: Take 125 mg by mouth at bedtime. 1 tab po qhs 10/24/16  Yes McGowen, Adrian Blackwater, MD  fluticasone (CUTIVATE) 0.05 % cream Apply to affected pinkish rash of both lower extremities as needed 05/22/17  Yes McGowen, Adrian Blackwater, MD  furosemide (LASIX) 20 MG tablet 1 tab po every evening 11/18/16  Yes McGowen, Adrian Blackwater, MD  furosemide (LASIX) 40 MG tablet Take 1 tablet (40 mg total) by mouth daily. 11/18/16  Yes McGowen, Adrian Blackwater, MD  hydrALAZINE (APRESOLINE) 25 MG tablet Take 1 tablet (25 mg total) by  mouth 3 (three) times daily. 06/25/16  Yes McGowen, Adrian Blackwater, MD  loperamide (IMODIUM A-D) 2 MG tablet Take 2 mg by mouth daily.    Yes [provider]  Multiple Vitamin (MULTIVITAMIN) capsule Take 1 capsule by mouth daily.     Yes [provider]  oxybutynin (DITROPAN-XL) 5 MG 24 hr tablet Take 5 mg by mouth at bedtime.  05/13/17  Yes [provider]  patiromer (VELTASSA) 8.4 g packet Take 1 packet (8.4 g total) by mouth daily. Do not give within 3 hours of taking any other medications.  May give with OR without food. 06/17/17  Yes McGowen, Adrian Blackwater, MD  ranitidine (ZANTAC) 75 MG tablet Take 75 mg by mouth daily.   Yes [provider]  vitamin C (ASCORBIC ACID) 500 MG tablet Take 500 mg by mouth daily.    Yes [provider]    Physical Exam: Vitals:   08/20/17 1830 08/20/17 2030 08/20/17 2044 08/20/17 2130  BP: Marland Kitchen)  170/81 (!) 169/62  (!) 166/55  Pulse: (!) 43 (!) 45  (!) 45  Resp: 20 11  (!) 8  Temp:      TempSrc:      SpO2: 99% 100%  100%  Weight:   45.4 kg (100 lb)   Height:   5\' 5"  (1.651 m)     Constitutional: NAD, calm, comfortable, awake and answering questions at the time of my evaluation Vitals:   08/20/17 1830 08/20/17 2030 08/20/17 2044 08/20/17 2130  BP: (!) 170/81 (!) 169/62  (!) 166/55  Pulse: (!) 43 (!) 45  (!) 45  Resp: 20 11  (!) 8  Temp:      TempSrc:      SpO2: 99% 100%  100%  Weight:   45.4 kg (100 lb)   Height:   5\' 5"  (1.651 m)    Eyes: PERRL, lids and conjunctivae normal ENMT: Mucous membranes are moist. Posterior pharynx clear of any exudate or lesions.Normal dentition.  Neck: normal, supple, no masses, no thyromegaly Respiratory: clear to auscultation bilaterally, no wheezing, no crackles. Normal respiratory effort. No accessory muscle use.  Cardiovascular: Bradycardic rate and irregular rhythm, no murmurs / rubs / gallops. No extremity edema. 2+ pedal pulses. No carotid bruits.  Abdomen: no tenderness, no  masses palpated. No hepatosplenomegaly. Bowel sounds positive.  Musculoskeletal: no clubbing / cyanosis. No joint deformity upper and lower extremities. Good ROM, no contractures. Normal muscle tone.  Skin: no rashes, lesions, ulcers. No induration Neurologic: CN 2-12 grossly intact. Sensation intact, DTR normal.  Psychiatric: Normal judgment and insight. Alert and oriented x 2 (name, location). Normal mood.    Labs on Admission: I have personally reviewed following labs and imaging studies  CBC: Recent Labs  Lab 08/20/17 1347 08/20/17 1409 08/20/17 1933  WBC 5.3  --  5.4  NEUTROABS 3.7  --   --   HGB 8.7* 9.2* 9.7*  HCT 27.0* 27.0* 30.3*  MCV 101.1*  --  100.7*  PLT 172  --  269   Basic Metabolic Panel: Recent Labs  Lab 08/20/17 1347 08/20/17 1409 08/20/17 1933  NA 139 140  --   K 5.3* 5.3*  --   CL 111 110  --   CO2 20*  --   --   GLUCOSE 139* 133*  --   BUN 67* 58*  --   CREATININE 2.15* 2.10* 2.08*  CALCIUM 9.1  --   --    GFR: Estimated Creatinine Clearance: 11.6 mL/min (A) (by C-G formula based on SCr of 2.08 mg/dL (H)). Liver Function Tests: Recent Labs  Lab 08/20/17 1347  AST 18  ALT 5*  ALKPHOS 81  BILITOT 0.5  PROT 5.8*  ALBUMIN 2.9*    Recent Labs  Lab 08/20/17 1347  AMMONIA 35   Thyroid Function Tests: Recent Labs    08/20/17 1933  TSH 0.206*   Urine analysis:    Component Value Date/Time   COLORURINE STRAW (A) 08/20/2017 1533   APPEARANCEUR CLEAR 08/20/2017 1533   LABSPEC 1.010 08/20/2017 1533   PHURINE 5.0 08/20/2017 1533   GLUCOSEU NEGATIVE 08/20/2017 1533   GLUCOSEU NEGATIVE 01/06/2012 1620   HGBUR NEGATIVE 08/20/2017 1533   BILIRUBINUR NEGATIVE 08/20/2017 1533   KETONESUR NEGATIVE 08/20/2017 1533   PROTEINUR NEGATIVE 08/20/2017 1533   UROBILINOGEN 0.2 01/06/2012 1620   NITRITE NEGATIVE 08/20/2017 1533   LEUKOCYTESUR TRACE (A) 08/20/2017 1533    Radiological Exams on Admission: Ct Head Wo Contrast  Result Date:  08/20/2017 CLINICAL  DATA:  Lethargic, altered mental status, hypertension EXAM: CT HEAD WITHOUT CONTRAST TECHNIQUE: Contiguous axial images were obtained from the base of the skull through the vertex without intravenous contrast. COMPARISON:  09/22/2014 FINDINGS: Brain: Stable cystic encephalomalacia in the right parietal temporal lobe from a remote infarct. Extensive brain atrophy and chronic white matter microvascular ischemic changes throughout both cerebral hemispheres. No acute intracranial hemorrhage, mass lesion, new infarction midline shift, herniation, hydrocephalus, or extra-axial fluid collection. No focal mass effect or edema. Cisterns are patent. Cerebellar atrophy as well. Vascular: Intracranial atherosclerosis noted.  No hyperdense vessel. Skull: Normal. Negative for fracture or focal lesion. Sinuses/Orbits: Chronic mucosal thickening in the maxillary sinuses. Other sinuses are clear. Orbits are symmetric. Other: None. IMPRESSION: Stable brain atrophy, chronic white matter microvascular changes, and remote right cerebral infarct with encephalomalacia as above. No significant interval change or acute process by noncontrast CT. Electronically Signed   By: Jerilynn Mages.  Shick M.D.   On: 08/20/2017 17:29   Dg Chest Portable 1 View  Result Date: 08/20/2017 CLINICAL DATA:  Lethargy. EXAM: PORTABLE CHEST 1 VIEW COMPARISON:  12/11/2014. FINDINGS: Cardiomegaly. Tortuous aorta. Mild vascular congestion. No consolidation or edema. Osteopenia. Similar appearance to priors. IMPRESSION: Cardiomegaly with mild vascular congestion. No consolidation or edema. Electronically Signed   By: Staci Righter M.D.   On: 08/20/2017 14:26    EKG: Independently reviewed.  Sinus arrhythmia with increased PR interval and right bundle branch blocks with left ventricular hypertrophy.  And bradycardia  Assessment/Plan Principal Problem:   Bradycardia Active Problems:   Chronic venous insufficiency   Chronic renal insufficiency,  stage IV (severe) (HCC)   Abnormal serum thyroid stimulating hormone (TSH) level   Hyperglycemia   HYPERCHOLESTEROLEMIA   Pernicious anemia   Presbycusis of both ears   1.  Bradycardia: Patient will be admitted into the hospital.  I have consulted Dr. Wynonia Lawman from cardiology to see the patient regarding her bradycardia.  I do not believe that any of her medicines can cause the bradycardia I am concerned about Depakote however and will be checking into that.  2.  Chronic venous insufficiency: Patient with bilateral lower extremity edema left greater than right which is chronic and consistent with her history of chronic venous insufficiency.  Her physician has prescribed Lac-Hydrin lotion and it has had some beneficial effects however her edema remains.  She has PRN Lasix ordered at the facility which I will switch to daily while she is here.  3.  Chronic renal insufficiency stage IV severe: Patient with a GFR of 19 or less.  Will avoid nephrotoxic medications monitor Lasix use closely.  4.  Abnormal TSH: TSH appears to be low consistent with hyperthyroidism however in a patient with bradycardia starting additional medications and her current cardiac situation may be quite dangerous.  I would recommend we monitor her TSH and recheck it in several days when she is feeling better.  5.  Hyperglycemia: Patient with several elevated blood glucoses going back about 6 months.  None of them have been greater than 140.  I will check a hemoglobin A1c.  6.  Hypercholesterolemia: Patient currently on no medications as her physician rightfully does not believe that at this point it would be of any significant benefits and risks of side effects would outweigh any benefits.  7.  Pernicious anemia: Patient receives injections monthly.  8.  Presbycusis of both ears: Patient has better hearing in her left ear but still would recommend speaking loudly into that left ear.  DVT prophylaxis: Lovenox Code Status:  DNR Family Communication: *Spoke with patient's daughter Sander Radon by phone at phone [phone].  Her cell phone #[phone] Disposition Plan: Back to assisted living facility in 3 to 4 days Consults called: Dr. Wynonia Lawman from [facility] cardiology Admission status: Observation   Lady Deutscher MD [facility] Pager [phone]  If 7PM-7AM, please contact night-coverage [email or web address] Password [facility]  08/20/2017, 9:58 PM

## 2017-08-21 ENCOUNTER — Observation Stay (HOSPITAL_COMMUNITY): Payer: Medicare Other

## 2017-08-21 ENCOUNTER — Observation Stay (HOSPITAL_BASED_OUTPATIENT_CLINIC_OR_DEPARTMENT_OTHER): Payer: Medicare Other

## 2017-08-21 DIAGNOSIS — D631 Anemia in chronic kidney disease: Secondary | ICD-10-CM | POA: Diagnosis not present

## 2017-08-21 DIAGNOSIS — R001 Bradycardia, unspecified: Secondary | ICD-10-CM

## 2017-08-21 DIAGNOSIS — N184 Chronic kidney disease, stage 4 (severe): Secondary | ICD-10-CM | POA: Diagnosis not present

## 2017-08-21 DIAGNOSIS — I428 Other cardiomyopathies: Secondary | ICD-10-CM | POA: Diagnosis not present

## 2017-08-21 DIAGNOSIS — R7989 Other specified abnormal findings of blood chemistry: Secondary | ICD-10-CM

## 2017-08-21 DIAGNOSIS — R609 Edema, unspecified: Secondary | ICD-10-CM | POA: Diagnosis not present

## 2017-08-21 DIAGNOSIS — I499 Cardiac arrhythmia, unspecified: Secondary | ICD-10-CM | POA: Diagnosis not present

## 2017-08-21 DIAGNOSIS — E875 Hyperkalemia: Secondary | ICD-10-CM | POA: Diagnosis not present

## 2017-08-21 DIAGNOSIS — D510 Vitamin B12 deficiency anemia due to intrinsic factor deficiency: Secondary | ICD-10-CM | POA: Diagnosis not present

## 2017-08-21 LAB — CBC
HEMATOCRIT: 27.2 % — AB (ref 36.0–46.0)
Hemoglobin: 8.8 g/dL — ABNORMAL LOW (ref 12.0–15.0)
MCH: 32.1 pg (ref 26.0–34.0)
MCHC: 32.4 g/dL (ref 30.0–36.0)
MCV: 99.3 fL (ref 78.0–100.0)
PLATELETS: 169 10*3/uL (ref 150–400)
RBC: 2.74 MIL/uL — ABNORMAL LOW (ref 3.87–5.11)
RDW: 14 % (ref 11.5–15.5)
WBC: 5.4 10*3/uL (ref 4.0–10.5)

## 2017-08-21 LAB — BASIC METABOLIC PANEL
Anion gap: 9 (ref 5–15)
BUN: 66 mg/dL — AB (ref 6–20)
CO2: 22 mmol/L (ref 22–32)
Calcium: 9.2 mg/dL (ref 8.9–10.3)
Chloride: 109 mmol/L (ref 101–111)
Creatinine, Ser: 2.01 mg/dL — ABNORMAL HIGH (ref 0.44–1.00)
GFR calc Af Amer: 23 mL/min — ABNORMAL LOW (ref >60)
GFR, EST NON AFRICAN AMERICAN: 20 mL/min — AB (ref >60)
Glucose, Bld: 105 mg/dL — ABNORMAL HIGH (ref 65–99)
Potassium: 4.4 mmol/L (ref 3.5–5.1)
SODIUM: 140 mmol/L (ref 135–145)

## 2017-08-21 LAB — T4, FREE: FREE T4: 0.85 ng/dL (ref 0.61–1.12)

## 2017-08-21 LAB — ECHOCARDIOGRAM COMPLETE
Height: 65 in
Weight: 1600 oz

## 2017-08-21 LAB — PROTIME-INR
INR: 1
Prothrombin Time: 13.1 seconds (ref 11.4–15.2)

## 2017-08-21 LAB — HEMOGLOBIN A1C
Hgb A1c MFr Bld: 5.1 % (ref 4.8–5.6)
Mean Plasma Glucose: 99.67 mg/dL

## 2017-08-21 LAB — APTT: APTT: 34 s (ref 24–36)

## 2017-08-21 NOTE — Progress Notes (Signed)
  Echocardiogram 2D Echocardiogram has been performed.  Carrie Grimes 08/21/2017, 10:45 AM

## 2017-08-21 NOTE — Discharge Summary (Signed)
Physician Discharge Summary  Carrie Grimes MRN: [id number] DOB/AGE: November 11, 1920 84 y.o.  PCP: Tammi Sou, MD   Admit date: 08/20/2017 Discharge date: 08/21/2017  Discharge Diagnoses:    Principal Problem:   Bradycardia Active Problems:   HYPERCHOLESTEROLEMIA   Pernicious anemia   Presbycusis of both ears   Chronic venous insufficiency   Chronic renal insufficiency, stage IV (severe) (HCC)   Abnormal serum thyroid stimulating hormone (TSH) level   Hyperglycemia    Follow-up recommendations    Patient is being discharged back to assisted living with home health CBC, BMP in 3-5 days Fall precautions Aspiration precautions      Allergies as of 08/21/2017      Reactions   Procaine Hcl Other (See Comments)   REACTION: pt states reaction to shot at dentist office w/ tachycardia \T\ SOB (? if really from combination w/epi)      Medication List    TAKE these medications   acetaminophen 500 MG tablet Commonly known as:  TYLENOL Take 500 mg by mouth every 6 (six) hours as needed.   acetaminophen 500 MG tablet Commonly known as:  TYLENOL Take 1,000 mg by mouth. Three times daily and as needed for pain   ammonium lactate 12 % cream Commonly known as:  AMLACTIN Apply topically to lower legs at bedtime every night for dry/thick skin   cetirizine 10 MG tablet Commonly known as:  ZYRTEC Take 10 mg by mouth daily.   clopidogrel 75 MG tablet Commonly known as:  PLAVIX Take 1 tablet (75 mg total) by mouth daily. RESTART THIS IN 3 DAYS   cyanocobalamin 1000 MCG/ML injection Commonly known as:  (VITAMIN B-12) Inject 1,000 mcg into the skin every 30 (thirty) days. On the 12th of every month.   divalproex 125 MG capsule Commonly known as:  DEPAKOTE SPRINKLE 1 tab po qhs What changed:    how much to take  how to take this  when to take this  additional instructions   fluticasone 0.05 % cream Commonly known as:  CUTIVATE Apply to affected pinkish  rash of both lower extremities as needed   furosemide 40 MG tablet Commonly known as:  LASIX Take 1 tablet (40 mg total) by mouth daily. What changed:  Another medication with the same name was removed. Continue taking this medication, and follow the directions you see here.   hydrALAZINE 25 MG tablet Commonly known as:  APRESOLINE Take 1 tablet (25 mg total) by mouth 3 (three) times daily.   loperamide 2 MG tablet Commonly known as:  IMODIUM A-D Take 2 mg by mouth daily.   multivitamin capsule Take 1 capsule by mouth daily.   oxybutynin 5 MG 24 hr tablet Commonly known as:  DITROPAN-XL Take 5 mg by mouth at bedtime.   patiromer 8.4 g packet Commonly known as:  VELTASSA Take 1 packet (8.4 g total) by mouth daily. Do not give within 3 hours of taking any other medications.  May give with OR without food.   ranitidine 75 MG tablet Commonly known as:  ZANTAC Take 75 mg by mouth daily.   vitamin C 500 MG tablet Commonly known as:  ASCORBIC ACID Take 500 mg by mouth daily.        Discharge Condition: None  Discharge Instructions Get Medicines reviewed and adjusted: Please take all your medications with you for your next visit with your Primary MD  Please request your Primary MD to go over all hospital tests and procedure/radiological results at  the follow up, please ask your Primary MD to get all Hospital records sent to his/her office.  If you experience worsening of your admission symptoms, develop shortness of breath, life threatening emergency, suicidal or homicidal thoughts you must seek medical attention immediately by calling 911 or calling your MD immediately if symptoms less severe.  You must read complete instructions/literature along with all the possible adverse reactions/side effects for all the Medicines you take and that have been prescribed to you. Take any new Medicines after you have completely understood and accpet all the possible adverse reactions/side  effects.   Do not drive when taking Pain medications.   Do not take more than prescribed Pain, Sleep and Anxiety Medications  Special Instructions: If you have smoked or chewed Tobacco in the last 2 yrs please stop smoking, stop any regular Alcohol and or any Recreational drug use.  Wear Seat belts while driving.  Please note  You were cared for by a hospitalist during your hospital stay. Once you are discharged, your primary care physician will handle any further medical issues. Please note that NO REFILLS for any discharge medications will be authorized once you are discharged, as it is imperative that you return to your primary care physician (or establish a relationship with a primary care physician if you do not have one) for your aftercare needs so that they can reassess your need for medications and monitor your lab values.     Allergies  Allergen Reactions  . Procaine Hcl Other (See Comments)    REACTION: pt states reaction to shot at dentist office w/ tachycardia \T\ SOB (? if really from combination w/epi)      Disposition: Discharge disposition: 01-Home or Self Care        Consults:   Cardiology    Significant Diagnostic Studies:  Ct Head Wo Contrast  Result Date: 08/20/2017 CLINICAL DATA:  Lethargic, altered mental status, hypertension EXAM: CT HEAD WITHOUT CONTRAST TECHNIQUE: Contiguous axial images were obtained from the base of the skull through the vertex without intravenous contrast. COMPARISON:  09/22/2014 FINDINGS: Brain: Stable cystic encephalomalacia in the right parietal temporal lobe from a remote infarct. Extensive brain atrophy and chronic white matter microvascular ischemic changes throughout both cerebral hemispheres. No acute intracranial hemorrhage, mass lesion, new infarction midline shift, herniation, hydrocephalus, or extra-axial fluid collection. No focal mass effect or edema. Cisterns are patent. Cerebellar atrophy as well. Vascular:  Intracranial atherosclerosis noted.  No hyperdense vessel. Skull: Normal. Negative for fracture or focal lesion. Sinuses/Orbits: Chronic mucosal thickening in the maxillary sinuses. Other sinuses are clear. Orbits are symmetric. Other: None. IMPRESSION: Stable brain atrophy, chronic white matter microvascular changes, and remote right cerebral infarct with encephalomalacia as above. No significant interval change or acute process by noncontrast CT. Electronically Signed   By: Jerilynn Mages.  Shick M.D.   On: 08/20/2017 17:29   Dg Chest Portable 1 View  Result Date: 08/20/2017 CLINICAL DATA:  Lethargy. EXAM: PORTABLE CHEST 1 VIEW COMPARISON:  12/11/2014. FINDINGS: Cardiomegaly. Tortuous aorta. Mild vascular congestion. No consolidation or edema. Osteopenia. Similar appearance to priors. IMPRESSION: Cardiomegaly with mild vascular congestion. No consolidation or edema. Electronically Signed   By: Staci Righter M.D.   On: 08/20/2017 14:26             Filed Weights   08/20/17 2044  Weight: 45.4 kg (100 lb)     Microbiology: No results found for this or any previous visit (from the past 240 hour(s)).     Blood  Culture    Component Value Date/Time   SDES URINE, RANDOM 06/29/2010 0330   SPECREQUEST IMMUNE:NORM UT SYMPT:NEG 06/29/2010 0330   CULT ESCHERICHIA COLI 06/29/2010 0330   REPTSTATUS 06/30/2010 FINAL 06/29/2010 0330      Labs: Results for orders placed or performed during the hospital encounter of 08/20/17 (from the past 48 hour(s))  Comprehensive metabolic panel     Status: Abnormal   Collection Time: 08/20/17  1:47 PM  Result Value Ref Range   Sodium 139 135 - 145 mmol/L   Potassium 5.3 (H) 3.5 - 5.1 mmol/L   Chloride 111 101 - 111 mmol/L   CO2 20 (L) 22 - 32 mmol/L   Glucose, Bld 139 (H) 65 - 99 mg/dL   BUN 67 (H) 6 - 20 mg/dL   Creatinine, Ser 2.15 (H) 0.44 - 1.00 mg/dL   Calcium 9.1 8.9 - 10.3 mg/dL   Total Protein 5.8 (L) 6.5 - 8.1 g/dL   Albumin 2.9 (L) 3.5 - 5.0 g/dL    AST 18 15 - 41 U/L   ALT 5 (L) 14 - 54 U/L   Alkaline Phosphatase 81 38 - 126 U/L   Total Bilirubin 0.5 0.3 - 1.2 mg/dL   GFR calc non Af Amer 18 (L) >60 mL/min   GFR calc Af Amer 21 (L) >60 mL/min    Comment: (NOTE) The eGFR has been calculated using the CKD EPI equation. This calculation has not been validated in all clinical situations. eGFR's persistently <60 mL/min signify possible Chronic Kidney Disease.    Anion gap 8 5 - 15    Comment: Performed at [facility] [address] [place], [place] [address]  CBC with Differential     Status: Abnormal   Collection Time: 08/20/17  1:47 PM  Result Value Ref Range   WBC 5.3 4.0 - 10.5 K/uL   RBC 2.67 (L) 3.87 - 5.11 MIL/uL   Hemoglobin 8.7 (L) 12.0 - 15.0 g/dL   HCT 27.0 (L) 36.0 - 46.0 %   MCV 101.1 (H) 78.0 - 100.0 fL   MCH 32.6 26.0 - 34.0 pg   MCHC 32.2 30.0 - 36.0 g/dL   RDW 14.8 11.5 - 15.5 %   Platelets 172 150 - 400 K/uL   Neutrophils Relative % 69 %   Neutro Abs 3.7 1.7 - 7.7 K/uL   Lymphocytes Relative 23 %   Lymphs Abs 1.2 0.7 - 4.0 K/uL   Monocytes Relative 6 %   Monocytes Absolute 0.3 0.1 - 1.0 K/uL   Eosinophils Relative 2 %   Eosinophils Absolute 0.1 0.0 - 0.7 K/uL   Basophils Relative 0 %   Basophils Absolute 0.0 0.0 - 0.1 K/uL    Comment: Performed at [facility] [address] [place], [place] [address]  Ammonia     Status: None   Collection Time: 08/20/17  1:47 PM  Result Value Ref Range   Ammonia 35 9 - 35 umol/L    Comment: Performed at [facility], [place] [address], [place], [place] [address]  I-stat troponin, ED     Status: None   Collection Time: 08/20/17  2:07 PM  Result Value Ref Range   Troponin i, poc 0.04 0.00 - 0.08 ng/mL   Comment 3            Comment: Due to the release kinetics of cTnI, a negative result within the first hours of the onset of symptoms does not rule out myocardial  infarction with certainty. If myocardial infarction is still suspected, repeat the  test at appropriate intervals.   I-stat Chem 8, ED     Status: Abnormal   Collection Time: 08/20/17  2:09 PM  Result Value Ref Range   Sodium 140 135 - 145 mmol/L   Potassium 5.3 (H) 3.5 - 5.1 mmol/L   Chloride 110 101 - 111 mmol/L   BUN 58 (H) 6 - 20 mg/dL   Creatinine, Ser 2.10 (H) 0.44 - 1.00 mg/dL   Glucose, Bld 133 (H) 65 - 99 mg/dL   Calcium, Ion 1.24 1.15 - 1.40 mmol/L   TCO2 22 22 - 32 mmol/L   Hemoglobin 9.2 (L) 12.0 - 15.0 g/dL   HCT 27.0 (L) 36.0 - 46.0 %  Valproic acid level     Status: Abnormal   Collection Time: 08/20/17  2:15 PM  Result Value Ref Range   Valproic Acid Lvl 11 (L) 50.0 - 100.0 ug/mL    Comment: Performed at [facility], [address], [place], [place] [address]  Urinalysis, Routine w reflex microscopic     Status: Abnormal   Collection Time: 08/20/17  3:33 PM  Result Value Ref Range   Color, Urine STRAW (A) YELLOW   APPearance CLEAR CLEAR   Specific Gravity, Urine 1.010 1.005 - 1.030   pH 5.0 5.0 - 8.0   Glucose, UA NEGATIVE NEGATIVE mg/dL   Hgb urine dipstick NEGATIVE NEGATIVE   Bilirubin Urine NEGATIVE NEGATIVE   Ketones, ur NEGATIVE NEGATIVE mg/dL   Protein, ur NEGATIVE NEGATIVE mg/dL   Nitrite NEGATIVE NEGATIVE   Leukocytes, UA TRACE (A) NEGATIVE   RBC / HPF 0-5 0 - 5 RBC/hpf   WBC, UA 0-5 0 - 5 WBC/hpf   Bacteria, UA MANY (A) NONE SEEN    Comment: Performed at [facility] [address], [place], [place] [address]  CBC     Status: Abnormal   Collection Time: 08/20/17  7:33 PM  Result Value Ref Range   WBC 5.4 4.0 - 10.5 K/uL   RBC 3.01 (L) 3.87 - 5.11 MIL/uL   Hemoglobin 9.7 (L) 12.0 - 15.0 g/dL   HCT 30.3 (L) 36.0 - 46.0 %   MCV 100.7 (H) 78.0 - 100.0 fL   MCH 32.2 26.0 - 34.0 pg   MCHC 32.0 30.0 - 36.0 g/dL   RDW 14.6 11.5 - 15.5 %   Platelets 187 150 - 400 K/uL    Comment: Performed at [facility], [place] [address], [place], [place] [address]  Creatinine, serum     Status: Abnormal   Collection Time:  08/20/17  7:33 PM  Result Value Ref Range   Creatinine, Ser 2.08 (H) 0.44 - 1.00 mg/dL   GFR calc non Af Amer 19 (L) >60 mL/min   GFR calc Af Amer 22 (L) >60 mL/min    Comment: (NOTE) The eGFR has been calculated using the CKD EPI equation. This calculation has not been validated in all clinical situations. eGFR's persistently <60 mL/min signify possible Chronic Kidney Disease. Performed at [facility], [place] [address], [place], [place] [address]   TSH     Status: Abnormal   Collection Time: 08/20/17  7:33 PM  Result Value Ref Range   TSH 0.206 (L) 0.350 - 4.500 uIU/mL    Comment: Performed by a 3rd Generation assay with a functional sensitivity of <=0.01 uIU/mL. Performed at [facility], [place] [address] [place], [place] [address]  Basic metabolic panel     Status: Abnormal   Collection Time: 08/21/17  3:27 AM  Result Value Ref Range   Sodium 140 135 - 145 mmol/L   Potassium 4.4 3.5 - 5.1 mmol/L   Chloride 109 101 - 111 mmol/L   CO2 22 22 - 32 mmol/L   Glucose, Bld 105 (H) 65 - 99 mg/dL   BUN 66 (H) 6 - 20 mg/dL   Creatinine, Ser 2.01 (H) 0.44 - 1.00 mg/dL   Calcium 9.2 8.9 - 10.3 mg/dL   GFR calc non Af Amer 20 (L) >60 mL/min   GFR calc Af Amer 23 (L) >60 mL/min    Comment: (NOTE) The eGFR has been calculated using the CKD EPI equation. This calculation has not been validated in all clinical situations. eGFR's persistently <60 mL/min signify possible Chronic Kidney Disease.    Anion gap 9 5 - 15    Comment: Performed at [facility] [address], [place], [place] [address]  CBC     Status: Abnormal   Collection Time: 08/21/17  3:27 AM  Result Value Ref Range   WBC 5.4 4.0 - 10.5 K/uL   RBC 2.74 (L) 3.87 - 5.11 MIL/uL   Hemoglobin 8.8 (L) 12.0 - 15.0 g/dL   HCT 27.2 (L) 36.0 - 46.0 %   MCV 99.3 78.0 - 100.0 fL   MCH 32.1 26.0 - 34.0 pg   MCHC 32.4 30.0 - 36.0 g/dL   RDW 14.0 11.5 - 15.5 %   Platelets 169 150 - 400 K/uL    Comment: Performed  at [facility], [place] [address], [place], [place] [address]  Protime-INR     Status: None   Collection Time: 08/21/17  3:27 AM  Result Value Ref Range   Prothrombin Time 13.1 11.4 - 15.2 seconds   INR 1.00     Comment: Performed at [facility] [address], [place], [place] [address]  APTT     Status: None   Collection Time: 08/21/17  3:27 AM  Result Value Ref Range   aPTT 34 24 - 36 seconds    Comment: Performed at [facility] [address] [place], [place] [address]  Hemoglobin A1c     Status: None   Collection Time: 08/21/17  3:27 AM  Result Value Ref Range   Hgb A1c MFr Bld 5.1 4.8 - 5.6 %    Comment: (NOTE) Pre diabetes:          5.7%-6.4% Diabetes:              >6.4% Glycemic control for   <7.0% adults with diabetes    Mean Plasma Glucose 99.67 mg/dL    Comment: Performed at [facility] [address], [place], [place] [address]     Lipid Panel     Component Value Date/Time   CHOL 133 12/02/2012 1046   TRIG 132.0 12/02/2012 1046   HDL 42.60 12/02/2012 1046   CHOLHDL 3 12/02/2012 1046   VLDL 26.4 12/02/2012 1046   LDLCALC 64 12/02/2012 1046   LDLDIRECT 74.7 01/29/2011 1300     Lab Results  Component Value Date   HGBA1C 5.1 08/21/2017     Lab Results  Component Value Date   LDLCALC 64 12/02/2012   CREATININE 2.01 (H) 08/21/2017     HPI :  Carrie Grimes is a 84 y.o. female with medical history significant for stroke, status post right carotid endarterectomy,  dyslipidemia, chronic venous insufficiency, chronic kidney disease stage IV, chronic venous insufficiency, and pernicious anemia as well as dementia who presents to the emergency department being sent in from her assisted living facility due to lethargy and fatigue.  EMS reports that she had a rate of 30 to 60 bpm on route to the hospital.  Initially upon arrival she was responsive to verbal stimuli she would converse and then fall right back to sleep.  Patient  denies any complaints but reports to me that she has been very sleepy lately and is wondering if some of her medications may have brought this on.  She says she remembers going to breakfast but thinks she may have passed out because she does not remember anything since then.  Staff did not report any syncope but rather that she was lethargic and unable to stay awake.  She has no history of [place] mishaps or recent complaints that would account for her recent increase in lethargy.  She denies any headaches.  She takes Depakote but her levels in the emergency department were low.  He does have chronic kidney disease with a creatinine of approximately 2 which is at her baseline.  In the emergency department she was noted to have a sustained heart rate in the 30s to 40s and I was asked to admit the patient for bradycardia.  History is severely limited by her dementia and inability to give any cogent information.  HOSPITAL COURSE:   1.  Bradycardia: Patient will be admitted into the hospital.  Patient was evaluated by cardiology , Dr. Harrington Challenger who did not see any significant pauses and no evidence of high-grade AV block. Blood pressure remained stable. Patient could have some chronotropic incompetence, but no clear indication for a pacemaker. Family in agreement. He should is being discharged back to ALF today with home health services. PT OT evaluation was completed prior to discharge   2.  Chronic venous insufficiency: Patient with bilateral lower extremity edema left greater than right which is chronic and consistent with her history of chronic venous insufficiency.  Her physician has prescribed Lac-Hydrin lotion and it has had some beneficial effects however her edema remains.  She has PRN Lasix ordered at the facility  , will DC p.m. dose of Lasix to decrease fall risk and continue the a.m. dose  3.  Chronic renal insufficiency stage IV severe: Patient with a GFR of 19 or less.  Will avoid nephrotoxic  medications monitor Lasix use closely. Monitor renal function closely  4.  Abnormal TSH: TSH  0.206 low consistent with hyperthyroidism , check free T4   5.  Hyperglycemia: Patient with several elevated blood glucoses going back about 6 months.  None of them have been greater than 140.   hemoglobin A1c 5.1 .  6.  Hypercholesterolemia: Patient currently on no medications as her physician rightfully does not believe that at this point it would be of any significant benefits and risks of side effects would outweigh any benefits.  7.  Pernicious anemia: Patient receives injections monthly.  8.  Presbycusis of both ears: Patient has better hearing in her left ear but still would recommend speaking loudly into that left ear.      Discharge Exam:   Blood pressure (!) 136/49, pulse (!) 59, temperature 97.6 F (36.4 C), temperature source Oral, resp. rate (!) 22, height 5' 5"  (1.651 m), weight 45.4 kg (100 lb), SpO2 97 %.  Respiratory: clear to auscultation bilaterally, no wheezing, no crackles. Normal respiratory  effort. No accessory muscle use.  Cardiovascular: Bradycardic rate and irregular rhythm, no murmurs / rubs / gallops. No extremity edema. 2+ pedal pulses. No carotid bruits.  Abdomen: no tenderness, no masses palpated. No hepatosplenomegaly. Bowel sounds positive.  Musculoskeletal: no clubbing / cyanosis. No joint deformity upper and lower extremities. Good ROM, no contractures. Normal muscle tone.  Skin: no rashes, lesions, ulcers. No induration Neurologic: CN 2-12 grossly intact. Sensation intact, DTR normal.       Follow-up Information    McGowen, Adrian Blackwater, MD. Call.   Specialty:  Family Medicine Why:  PCP in 3-5 days Contact information: [address] [place] [address] [phone]           Signed: Reyne Dumas 08/21/2017, 12:20 PM        Time spent >1 hour

## 2017-08-21 NOTE — ED Notes (Signed)
Cardiology at bedside.

## 2017-08-21 NOTE — ED Notes (Signed)
Lunch ordered for PT 

## 2017-08-21 NOTE — ED Notes (Signed)
Regular diet lunch tray ordered 

## 2017-08-21 NOTE — ED Notes (Signed)
Taken to vascular and echo

## 2017-08-21 NOTE — ED Notes (Signed)
PTAR called for transport to [facility] on [address] in [place] w/ no special equipment needed for transport.

## 2017-08-21 NOTE — ED Notes (Signed)
PT returns from [place]

## 2017-08-21 NOTE — ED Notes (Signed)
2 attempts made to call report to [facility] with no answer

## 2017-08-21 NOTE — Progress Notes (Signed)
Progress Note  Patient Name: Carrie Grimes Date of Encounter: 08/21/2017  Primary Cardiologist: No primary care provider on file.   Subjective   Breathing OK   No Cp    Inpatient Medications    Scheduled Meds: . ammonium lactate   Topical QHS  . clopidogrel  75 mg Oral Daily  . divalproex  125 mg Oral QHS  . enoxaparin (LOVENOX) injection  30 mg Subcutaneous Q24H  . famotidine  10 mg Oral Daily  . furosemide  20 mg Oral q1800  . furosemide  40 mg Oral Q24H  . hydrALAZINE  25 mg Oral TID  . loperamide  2 mg Oral Daily  . loratadine  10 mg Oral Daily  . multivitamin with minerals  1 tablet Oral Daily  . oxybutynin  5 mg Oral QHS  . patiromer  8.4 g Oral Daily  . sodium chloride flush  3 mL Intravenous Q12H  . triamcinolone cream   Topical Daily  . vitamin C  500 mg Oral Daily   Continuous Infusions: . sodium chloride 50 mL/hr at 08/20/17 2044   PRN Meds: acetaminophen **OR** acetaminophen, ondansetron **OR** ondansetron (ZOFRAN) IV, traMADol   Vital Signs    Vitals:   08/21/17 0200 08/21/17 0230 08/21/17 0300 08/21/17 0527  BP: (!) 146/47 (!) 148/46 (!) 134/51 (!) 152/49  Pulse: (!) 46 (!) 47 (!) 46 (!) 57  Resp: 12 10 (!) 0 (!) 25  Temp:      TempSrc:      SpO2: 95% 96% 97% 98%  Weight:      Height:        Intake/Output Summary (Last 24 hours) at 08/21/2017 0806 Last data filed at 08/20/2017 2158 Gross per 24 hour  Intake -  Output 701 ml  Net -701 ml   Filed Weights   08/20/17 2044  Weight: 100 lb (45.4 kg)    Telemetry    SB  SR  30s to 60   Occasionaal Wenkebach  No signif pauses - Personally Reviewed  ECG      Physical Exam   GEN: No acute distress.  Thin  Chatty   Neck: No JVD Cardiac: RRR, III/VI systolic murmur apex    rubs, or gallops.  Respiratory: Clear to auscultation bilaterally. GI: Soft, nontender, non-distended  MS: L leg swollen   1+ edema   Mild erythema of skin; No deformity. Neuro:  Nonfocal  Psych: Normal  affect   Labs    Chemistry Recent Labs  Lab 08/20/17 1347 08/20/17 1409 08/20/17 1933 08/21/17 0327  NA 139 140  --  140  K 5.3* 5.3*  --  4.4  CL 111 110  --  109  CO2 20*  --   --  22  GLUCOSE 139* 133*  --  105*  BUN 67* 58*  --  66*  CREATININE 2.15* 2.10* 2.08* 2.01*  CALCIUM 9.1  --   --  9.2  PROT 5.8*  --   --   --   ALBUMIN 2.9*  --   --   --   AST 18  --   --   --   ALT 5*  --   --   --   ALKPHOS 81  --   --   --   BILITOT 0.5  --   --   --   GFRNONAA 18*  --  19* 20*  GFRAA 21*  --  22* 23*  ANIONGAP 8  --   --  9     Hematology Recent Labs  Lab 08/20/17 1347 08/20/17 1409 08/20/17 1933 08/21/17 0327  WBC 5.3  --  5.4 5.4  RBC 2.67*  --  3.01* 2.74*  HGB 8.7* 9.2* 9.7* 8.8*  HCT 27.0* 27.0* 30.3* 27.2*  MCV 101.1*  --  100.7* 99.3  MCH 32.6  --  32.2 32.1  MCHC 32.2  --  32.0 32.4  RDW 14.8  --  14.6 14.0  PLT 172  --  187 169    Cardiac EnzymesNo results for input(s): TROPONINI in the last 168 hours.  Recent Labs  Lab 08/20/17 1407  TROPIPOC 0.04     BNPNo results for input(s): BNP, PROBNP in the last 168 hours.   DDimer No results for input(s): DDIMER in the last 168 hours.   Radiology    Ct Head Wo Contrast  Result Date: 08/20/2017 CLINICAL DATA:  Lethargic, altered mental status, hypertension EXAM: CT HEAD WITHOUT CONTRAST TECHNIQUE: Contiguous axial images were obtained from the base of the skull through the vertex without intravenous contrast. COMPARISON:  09/22/2014 FINDINGS: Brain: Stable cystic encephalomalacia in the right parietal temporal lobe from a remote infarct. Extensive brain atrophy and chronic white matter microvascular ischemic changes throughout both cerebral hemispheres. No acute intracranial hemorrhage, mass lesion, new infarction midline shift, herniation, hydrocephalus, or extra-axial fluid collection. No focal mass effect or edema. Cisterns are patent. Cerebellar atrophy as well. Vascular: Intracranial  atherosclerosis noted.  No hyperdense vessel. Skull: Normal. Negative for fracture or focal lesion. Sinuses/Orbits: Chronic mucosal thickening in the maxillary sinuses. Other sinuses are clear. Orbits are symmetric. Other: None. IMPRESSION: Stable brain atrophy, chronic white matter microvascular changes, and remote right cerebral infarct with encephalomalacia as above. No significant interval change or acute process by noncontrast CT. Electronically Signed   By: Jerilynn Mages.  Shick M.D.   On: 08/20/2017 17:29   Dg Chest Portable 1 View  Result Date: 08/20/2017 CLINICAL DATA:  Lethargy. EXAM: PORTABLE CHEST 1 VIEW COMPARISON:  12/11/2014. FINDINGS: Cardiomegaly. Tortuous aorta. Mild vascular congestion. No consolidation or edema. Osteopenia. Similar appearance to priors. IMPRESSION: Cardiomegaly with mild vascular congestion. No consolidation or edema. Electronically Signed   By: Staci Righter M.D.   On: 08/20/2017 14:26    Cardiac Studies   Echo:  LVEF / RVEF nNoromal   LAE   AV sclerosis  Mild stenosis    Full report pending     Patient Profile     84 y.o. female with history of HTN admitted with fatigue  Bradycardia    Assessment & Plan    1  Bradycardia  Pt intermitt bradycardic  There are no signif pauses and no evid of high grade AV block    BP has been good throughout     The pt may have some chronotropic incompetence but no clear indication for pacer I spoke with patient and daughter   They do not want this done  Anyway WOuld avolid agents that could slow  2  Murmur   AV is thickened, with minimally restricted motion  LVEF is normal  3  LE swelling   USN of leg neg for DVT   Mild erythema of skin     4  OK to d/c from cardiac standpoint   Pt in assisted living at [facility]   I am trying to contact them  [phone])  Orders should be adjusted so that pt does not come back for this in future   She is DNR  Family agrees with plan    She may need more assistance with acitvities of daily  living    This wlll need to be addressed by facility     For questions or updates, please contact [facility] Please consult [email or web address] for contact info under Cardiology/STEMI.      Signed, Dorris Carnes, MD  08/21/2017, 8:06 AM

## 2017-08-21 NOTE — Progress Notes (Signed)
*  Preliminary Results* Left lower extremity venous duplex completed. Left lower extremity is negative for deep vein thrombosis. There is no evidence of left Baker's cyst.  08/21/2017 10:03 AM  Maudry Mayhew, BS, RVT, RDCS, RDMS

## 2017-08-21 NOTE — Evaluation (Signed)
Physical Therapy Evaluation Patient Details Name: Carrie Grimes MRN: [id number] DOB: 02/05/1921 Today's Date: 08/21/2017   History of Present Illness  Pt is a 84 y/o female admitted secondary to increased lethargy and bradycardia. PMH includes dementia, renal insufficienct, CVA, and hearing loss.   Clinical Impression  Pt admitted secondary to problem above with deficits below. Pt requiring min A for bed mobility, however, pt refused further mobility as she did not have her RW. Per RN and pt, pt previously at [facility] and plan is to return. Will continue to follow acutely to maximize functional mobility independence and safety.      Follow Up Recommendations SNF;Supervision for mobility/OOB(return to [facility] )    Equipment Recommendations  None recommended by PT    Recommendations for Other Services       Precautions / Restrictions Precautions Precautions: Fall Restrictions Weight Bearing Restrictions: No      Mobility  Bed Mobility Overal bed mobility: Needs Assistance Bed Mobility: Supine to Sit;Sit to Supine     Supine to sit: Min assist Sit to supine: Min assist   General bed mobility comments: Min A for trunk elevation and LE assist. Required min A for return to supine.   Transfers                 General transfer comment: Pt reports she did not want to stand without her RW, and did not have RW in ED room, therefor mobility deferred.   Ambulation/Gait                Stairs            Wheelchair Mobility    Modified Rankin (Stroke Patients Only)       Balance Overall balance assessment: Needs assistance Sitting-balance support: Bilateral upper extremity supported;Feet unsupported Sitting balance-Leahy Scale: Poor Sitting balance - Comments: Reliant on min guard to min A for steadying in sitting.                                      Pertinent Vitals/Pain Pain Assessment: No/denies pain    Home Living  Family/patient expects to be discharged to:: Skilled nursing facility                 Additional Comments: From [facility]     Prior Function Level of Independence: Needs assistance   Gait / Transfers Assistance Needed: Reports she ambulated with RW.   ADL's / Homemaking Assistance Needed: Reports staff assisted with ADL tasks.         Hand Dominance        Extremity/Trunk Assessment   Upper Extremity Assessment Upper Extremity Assessment: Generalized weakness    Lower Extremity Assessment Lower Extremity Assessment: Generalized weakness    Cervical / Trunk Assessment Cervical / Trunk Assessment: Kyphotic  Communication   Communication: HOH(hears better out of L ear. )  Cognition Arousal/Alertness: Awake/alert Behavior During Therapy: WFL for tasks assessed/performed Overall Cognitive Status: History of cognitive impairments - at baseline                                 General Comments: Dementia at baseline.       General Comments General comments (skin integrity, edema, etc.): VSS throughout.     Exercises     Assessment/Plan    PT Assessment Patient needs continued PT  services  PT Problem List Decreased strength;Decreased balance;Decreased mobility;Decreased knowledge of use of DME;Decreased knowledge of precautions;Decreased cognition       PT Treatment Interventions DME instruction;Gait training;Stair training;Functional mobility training;Therapeutic activities;Therapeutic exercise;Balance training;Neuromuscular re-education;Patient/family education    PT Goals (Current goals can be found in the Care Plan section)  Acute Rehab PT Goals Patient Stated Goal: to go back to [facility]  PT Goal Formulation: With patient Time For Goal Achievement: 09/04/17 Potential to Achieve Goals: Good    Frequency Min 2X/week   Barriers to discharge        Co-evaluation               AM-PAC PT "6 Clicks" Daily Activity  Outcome Measure  Difficulty turning over in bed (including adjusting bedclothes, sheets and blankets)?: A Little Difficulty moving from lying on back to sitting on the side of the bed? : Unable Difficulty sitting down on and standing up from a chair with arms (e.g., wheelchair, bedside commode, etc,.)?: Unable Help needed moving to and from a bed to chair (including a wheelchair)?: A Little Help needed walking in hospital room?: A Lot Help needed climbing 3-5 steps with a railing? : A Lot 6 Click Score: 12    End of Session   Activity Tolerance: Patient tolerated treatment well Patient left: in bed;with call bell/phone within reach Nurse Communication: Mobility status PT Visit Diagnosis: Other abnormalities of gait and mobility (R26.89);Muscle weakness (generalized) (M62.81)    Time: 9987-2158 PT Time Calculation (min) (ACUTE ONLY): 10 min   Charges:   PT Evaluation $PT Eval Low Complexity: 1 Low     PT G Codes:        Leighton Ruff, PT, DPT  Acute Rehabilitation Services  Pager: [phone]   Rudean Hitt 08/21/2017, 2:54 PM

## 2017-08-21 NOTE — ED Notes (Signed)
Pt stable for discharge, states understanding follow up.

## 2017-09-09 DIAGNOSIS — N184 Chronic kidney disease, stage 4 (severe): Secondary | ICD-10-CM | POA: Diagnosis not present

## 2017-09-09 DIAGNOSIS — I129 Hypertensive chronic kidney disease with stage 1 through stage 4 chronic kidney disease, or unspecified chronic kidney disease: Secondary | ICD-10-CM | POA: Diagnosis not present

## 2017-09-09 DIAGNOSIS — E875 Hyperkalemia: Secondary | ICD-10-CM | POA: Diagnosis not present

## 2017-09-09 DIAGNOSIS — N2581 Secondary hyperparathyroidism of renal origin: Secondary | ICD-10-CM | POA: Diagnosis not present

## 2017-09-09 DIAGNOSIS — N189 Chronic kidney disease, unspecified: Secondary | ICD-10-CM | POA: Diagnosis not present

## 2017-09-09 DIAGNOSIS — D631 Anemia in chronic kidney disease: Secondary | ICD-10-CM | POA: Diagnosis not present

## 2017-09-09 LAB — CBC AND DIFFERENTIAL
HEMATOCRIT: 28 — AB (ref 36–46)
HEMOGLOBIN: 9.4 — AB (ref 12.0–16.0)
NEUTROS ABS: 3
PLATELETS: 218 (ref 150–399)
WBC: 5.7

## 2017-09-09 LAB — BASIC METABOLIC PANEL
BUN: 66 — AB (ref 4–21)
CREATININE: 2.4 — AB (ref <=1.1)
Glucose: 105
Potassium: 5.1 (ref 3.4–5.3)
Sodium: 141 (ref 137–147)

## 2017-09-11 ENCOUNTER — Ambulatory Visit: Payer: Medicare Other | Admitting: Family Medicine

## 2017-09-11 DIAGNOSIS — Z8673 Personal history of transient ischemic attack (TIA), and cerebral infarction without residual deficits: Secondary | ICD-10-CM | POA: Diagnosis not present

## 2017-09-11 DIAGNOSIS — I129 Hypertensive chronic kidney disease with stage 1 through stage 4 chronic kidney disease, or unspecified chronic kidney disease: Secondary | ICD-10-CM | POA: Diagnosis not present

## 2017-09-11 DIAGNOSIS — D510 Vitamin B12 deficiency anemia due to intrinsic factor deficiency: Secondary | ICD-10-CM | POA: Diagnosis not present

## 2017-09-11 DIAGNOSIS — Z79899 Other long term (current) drug therapy: Secondary | ICD-10-CM | POA: Diagnosis not present

## 2017-09-11 DIAGNOSIS — N183 Chronic kidney disease, stage 3 (moderate): Secondary | ICD-10-CM | POA: Diagnosis not present

## 2017-09-11 DIAGNOSIS — I872 Venous insufficiency (chronic) (peripheral): Secondary | ICD-10-CM | POA: Diagnosis not present

## 2017-09-11 DIAGNOSIS — Z7902 Long term (current) use of antithrombotics/antiplatelets: Secondary | ICD-10-CM | POA: Diagnosis not present

## 2017-09-15 ENCOUNTER — Encounter: Payer: Self-pay | Admitting: Family Medicine

## 2017-09-18 ENCOUNTER — Ambulatory Visit (INDEPENDENT_AMBULATORY_CARE_PROVIDER_SITE_OTHER): Payer: Medicare Other | Admitting: Family Medicine

## 2017-09-18 ENCOUNTER — Encounter: Payer: Self-pay | Admitting: Family Medicine

## 2017-09-18 VITALS — BP 108/46 | HR 62 | Temp 98.4°F | Resp 16 | Ht 65.0 in | Wt 112.4 lb

## 2017-09-18 DIAGNOSIS — E875 Hyperkalemia: Secondary | ICD-10-CM | POA: Diagnosis not present

## 2017-09-18 DIAGNOSIS — F0391 Unspecified dementia with behavioral disturbance: Secondary | ICD-10-CM

## 2017-09-18 DIAGNOSIS — R001 Bradycardia, unspecified: Secondary | ICD-10-CM

## 2017-09-18 DIAGNOSIS — D631 Anemia in chronic kidney disease: Secondary | ICD-10-CM

## 2017-09-18 DIAGNOSIS — N184 Chronic kidney disease, stage 4 (severe): Secondary | ICD-10-CM | POA: Diagnosis not present

## 2017-09-18 DIAGNOSIS — I872 Venous insufficiency (chronic) (peripheral): Secondary | ICD-10-CM | POA: Diagnosis not present

## 2017-09-18 DIAGNOSIS — I10 Essential (primary) hypertension: Secondary | ICD-10-CM

## 2017-09-18 DIAGNOSIS — I4589 Other specified conduction disorders: Secondary | ICD-10-CM

## 2017-09-18 LAB — BASIC METABOLIC PANEL
BUN: 72 mg/dL — AB (ref 6–23)
CALCIUM: 9.5 mg/dL (ref 8.4–10.5)
CO2: 27 meq/L (ref 19–32)
CREATININE: 2.14 mg/dL — AB (ref 0.40–1.20)
Chloride: 106 mEq/L (ref 96–112)
GFR: 22.75 mL/min — ABNORMAL LOW (ref >60.00)
Glucose, Bld: 135 mg/dL — ABNORMAL HIGH (ref 70–99)
Potassium: 5.7 mEq/L — ABNORMAL HIGH (ref 3.5–5.1)
SODIUM: 140 meq/L (ref 135–145)

## 2017-09-18 NOTE — Progress Notes (Signed)
OFFICE VISIT  09/18/2017   CC:  Chief Complaint  Patient presents with  . Follow-up    RCI   HPI:    Patient is a 84 y.o. Caucasian female who presents by herself from [place] care for 3 mo f/u dementia w/behavioral disturbance, HTN, CRI IV w/hx of hyperkalemia requiring veltassa, and chronic LE edema secondary to chronic venous insufficiency.   Also, pt weas admitted to hosp 08/20/17-08/21/17 for lethargy/presyncope.  She was dx'd with bradycardia and had Mobitz type I block intermittently, cardiologist stated that she had no clear indication for pacemaker.  Pt made it clear that she did not want a pacemaker, stating that she was too old.   EKG at that time:   Slow sinus arrhythmia Prolonged PR interval Right bundle branch block LVH with secondary repolarization abnormality rate decreased from prior.  Potassium was elevated to 5.3 on admission but was 4.4 the next day when she was d/c'd home.  The most recent potassium check by her nephrologist was 09/09/17 and it was 5.1.    Recent visit with nephrologist on 09/09/17---I reviewed this note during exam today.  Cr 2.37, GFR 17 ml/min. Very unclear whether pt is supposed to be on kayexalate or veltassa for treatment of her hyperkalemia. D/C summary from 4/25 does not show either med on her list. Also, lasix 40mg  was on list, but on her med rec form she brought today it shows she is still taking 40mg  lasix qAM and 20 mg qPM.  Nephrology note states she is taking only 20mg  lasix per day. Pt is demented and does not know what she takes.  No lethargy or presyncope since going home from hospital 08/21/17 per pt report today. She states that she feels good.    Past Medical History:  Diagnosis Date  . Allergic rhinitis, cause unspecified   . Anemia of chronic kidney failure, stage 4 (severe) (HCC)    Aranesp via nephrologist  . Cataracts, bilateral   . Cerebrovascular disease, unspecified    CDopplers 1/13 showed mild mixed  plaque w/ 40-59% (low end) bilat ICA stenoses, the prev right CAE & patch angioplasty were patent  . Choledocholithiasis    12/11/14 CT--nonobstructive (40mm x 6 mm)  . Chronic renal insufficiency, stage IV (severe) (HCC)    Dr. Marval Regal. Not a candidate for renal replacement therapy.  Pt does not want this, either.  . Dementia with behavioral disturbance    depakote helpful  . Diaphragmatic hernia without mention of obstruction or gangrene   . Essential hypertension   . Hyperkalemia 06/12/2015   diminished renal excretion: as of 05/2016, kayexolate started, low K diet.  . Impaired mobility    uses walker, wheelchair  . Pernicious anemia    + amemia of CRI  . Pure hypercholesterolemia   . RBBB   . Retinal hemorrhage of right eye   . Unspecified cerebral artery occlusion with cerebral infarction    MRI Brain 12/12 showed large remote right hemispheric infarct w/ encephalomalacia w/ prominent small vessel dis superimposed on atrophy... (residual deficits are diminished sensation on left side, left hand grip weakness,   . Unspecified disorder resulting from impaired renal function    hyperkalemia and anemia  . Unspecified hearing loss   . Unspecified venous (peripheral) insufficiency    lasix  . Unspecified vitamin D deficiency     Past Surgical History:  Procedure Laterality Date  . CAROTID ENDARTERECTOMY    . CHOLECYSTECTOMY    . ENDOSCOPIC RETROGRADE CHOLANGIOPANCREATOGRAPHY (ERCP)  WITH PROPOFOL N/A 03/09/2015   Procedure: ENDOSCOPIC RETROGRADE CHOLANGIOPANCREATOGRAPHY (ERCP) WITH PROPOFOL;  Surgeon: Milus Banister, MD;  Location: [facility] ENDOSCOPY;  Service: Endoscopy;  Laterality: N/A;  . laser surgery on right eye  12/02/2012   Dr. Baird Cancer at [facility]    Outpatient Medications Prior to Visit  Medication Sig Dispense Refill  . acetaminophen (TYLENOL) 500 MG tablet Take 1,000 mg by mouth. Three times daily and as needed for pain    . ammonium lactate (AMLACTIN) 12 % cream  Apply topically to lower legs at bedtime every night for dry/thick skin 385 g 6  . cetirizine (ZYRTEC) 10 MG tablet Take 10 mg by mouth daily.    . clopidogrel (PLAVIX) 75 MG tablet Take 1 tablet (75 mg total) by mouth daily. RESTART THIS IN 3 DAYS 30 tablet 3  . cyanocobalamin (,VITAMIN B-12,) 1000 MCG/ML injection Inject 1,000 mcg into the skin every 30 (thirty) days. On the 12th of every month.    . divalproex (DEPAKOTE SPRINKLE) 125 MG capsule 1 tab po qhs (Patient taking differently: Take 125 mg by mouth at bedtime. 1 tab po qhs) 30 capsule 0  . fluticasone (CUTIVATE) 0.05 % cream Apply to affected pinkish rash of both lower extremities as needed 30 g 1  . furosemide (LASIX) 20 MG tablet Take 1 tablet by mouth daily.    . furosemide (LASIX) 40 MG tablet Take 1 tablet (40 mg total) by mouth daily. 30 tablet 1  . hydrALAZINE (APRESOLINE) 25 MG tablet Take 1 tablet (25 mg total) by mouth 3 (three) times daily. 90 tablet 0  . loperamide (IMODIUM A-D) 2 MG tablet Take 2 mg by mouth daily.     . Multiple Vitamin (MULTIVITAMIN) capsule Take 1 capsule by mouth daily.      Marland Kitchen oxybutynin (DITROPAN-XL) 5 MG 24 hr tablet Take 5 mg by mouth at bedtime.     . patiromer (VELTASSA) 8.4 g packet Take 1 packet (8.4 g total) by mouth daily. Do not give within 3 hours of taking any other medications.  May give with OR without food. 30 packet 0  . ranitidine (ZANTAC) 75 MG tablet Take 75 mg by mouth daily.    . vitamin C (ASCORBIC ACID) 500 MG tablet Take 500 mg by mouth daily.     Marland Kitchen acetaminophen (TYLENOL) 500 MG tablet Take 500 mg by mouth every 6 (six) hours as needed.     No facility-administered medications prior to visit.     Allergies  Allergen Reactions  . Procaine Hcl Other (See Comments)    REACTION: pt states reaction to shot at dentist office w/ tachycardia \\T \ SOB (? if really from combination w/epi)    ROS As per HPI. Additionally: Review of Systems  Constitutional: Negative for fatigue  and fever.  HENT: Negative for congestion and sore throat.   Eyes: Negative for visual disturbance.  Respiratory: Negative for cough.   Cardiovascular: Positive for leg swelling. Negative for chest pain and palpitations.  Gastrointestinal: Negative for abdominal pain, blood in stool and nausea.  Endocrine: Negative for cold intolerance, heat intolerance, polydipsia, polyphagia and polyuria.  Genitourinary: Negative for dysuria.  Musculoskeletal: Negative for arthralgias, back pain, joint swelling and myalgias.  Skin: Negative for rash.  Neurological: Negative for tremors, weakness and headaches.  Hematological: Negative for adenopathy.  Psychiatric/Behavioral: Positive for agitation (occ in evenings). Negative for dysphoric mood.     PE: Blood pressure (!) 108/46, pulse 62, temperature 98.4 F (36.9 C),  temperature source Oral, resp. rate 16, height 5\' 5"  (1.651 m), weight 112 lb 6 oz (51 kg), SpO2 98 %. Gen: alert, oriented to person and general place. AFFECT: pleasant, lucid thought and speech. CV: RRR, (rate 65 by me), 2/6 syst murmur. LUNGS: CTA bilat, nonlabored. ABD: soft, NT/ND. EXT: no clubbing or cyanosis.  4+ pitting edema in both LL's.  No skin rash or breakdown. SKIN: no pallor or jaundice. Neuro: no focal deficits.  LABS:    Chemistry      Component Value Date/Time   NA 140 09/18/2017 1138   NA 141 09/09/2017   K 5.7 (H) 09/18/2017 1138   CL 106 09/18/2017 1138   CO2 27 09/18/2017 1138   BUN 72 (H) 09/18/2017 1138   BUN 66 (A) 09/09/2017   CREATININE 2.14 (H) 09/18/2017 1138   CREATININE 2.16 (H) 06/08/2015 1505   GLU 105 09/09/2017      Component Value Date/Time   CALCIUM 9.5 09/18/2017 1138   ALKPHOS 81 08/20/2017 1347   AST 18 08/20/2017 1347   ALT 5 (L) 08/20/2017 1347   BILITOT 0.5 08/20/2017 1347     Lab Results  Component Value Date   WBC 5.7 09/09/2017   HGB 9.4 (A) 09/09/2017   HCT 28 (A) 09/09/2017   MCV 99.3 08/21/2017   PLT 218  09/09/2017   Lab Results  Component Value Date   IRON 61 05/01/2017   TIBC 276 05/01/2017   FERRITIN 119 05/01/2017    IMPRESSION AND PLAN:  1) Dementia with behav disturbance, unspecified dementia type: stable. The current medical regimen is effective;  continue present plan and medications. Continue residing at [facility] unit.  2) CRI IV: pretty stable: GFR ranging from 17-22 ml/min for the last 6 mo. Pt declines dialysis if renal function should drop enough to require it.  3) Hyperkalemia: inadequate renal excretion of K+. Non-adherence to low K diet suspected due to living in ALF. On lasix, but dosing needs to be cleared up with a call to a nurse at [facility]. Additionally, we need to clear up the question of whether or not she is taking veltassa or kayexalate, as there are conflicting reports depending on what notes are reviewed in EMR and what is on her Med Rec form brought in today. BMET today.  4) HTN: bp actually low normal here today.  She has no dizziness upon standing. Will leave hydralazine at current dose for now.  5) chronic LL pitting edema: pretty stable. Unclear how compliant her ALF is with use of compression stockings and with elevating legs.   Low Na diet very likely NOT being followed. Need to clear up lasix dosing: she should be on 40mg  qAM and 20 mg qPM.  6) Bradycardia, with Mobitz I HB when in hosp recently: chronotropic incompetence is present, but cardiologist noted that there is no clear indication for pacemaker, plus pt is very resistant to ever getting a pacemaker put in. Monitor.  7)Anemia of CRI: Hb stable at 9.4 at most recent neph visit 09/09/17. They mentioned that they will take steps to get her back into the routine of getting aranesp.  An After Visit Summary was printed and given to the patient.  FOLLOW UP: Return in about 3 months (around 12/19/2017) for routine chronic illness f/u.  Signed:  Crissie Sickles, MD            09/18/2017

## 2017-09-19 ENCOUNTER — Telehealth: Payer: Self-pay | Admitting: Family Medicine

## 2017-09-19 DIAGNOSIS — E875 Hyperkalemia: Secondary | ICD-10-CM

## 2017-09-19 MED ORDER — PATIROMER SORBITEX CALCIUM 8.4 G PO PACK: PACK | ORAL | 2 refills | Status: DC

## 2017-09-19 NOTE — Telephone Encounter (Signed)
I printed a new rx for her Veltassa (patiromer). Pls make sure this change in dosing gets communicated to [facility] and make sure the rx gets sent to the correct pharmacy. Needs lab visit to check BMET in 7-10 days, dx hyperkalemia.-thx

## 2017-09-23 ENCOUNTER — Encounter: Payer: Self-pay | Admitting: Podiatry

## 2017-09-23 ENCOUNTER — Ambulatory Visit (INDEPENDENT_AMBULATORY_CARE_PROVIDER_SITE_OTHER): Payer: Medicare Other | Admitting: Podiatry

## 2017-09-23 DIAGNOSIS — B351 Tinea unguium: Secondary | ICD-10-CM

## 2017-09-23 DIAGNOSIS — D689 Coagulation defect, unspecified: Secondary | ICD-10-CM

## 2017-09-23 DIAGNOSIS — M79676 Pain in unspecified toe(s): Secondary | ICD-10-CM | POA: Diagnosis not present

## 2017-09-23 NOTE — Telephone Encounter (Signed)
Rx was faxed to [facility] on 09/19/17. Orders and Rx were faxed to [facility] on 09/19/17.   SW [place] at [facility] to confirm they received new order, they did. Also scheduled lab apt for pt, apt 10/02/17 at 1:00pm.

## 2017-09-23 NOTE — Progress Notes (Signed)
Patient ID: Carrie Grimes, female   DOB: 1920-12-15, 84 y.o.   MRN: [id number] Complaint:  Visit Type: Patient returns to my office for continued preventative foot care services. Complaint: Patient states" my nails have grown long and thick and become painful to walk and wear shoe. The patient presents for preventative foot care services. No changes to ROS  Podiatric Exam: Vascular: dorsalis pedis and posterior tibial pulses are not palpable bilateral. Capillary return is immediate. Cold feet noted. Sensorium: Normal Semmes Weinstein monofilament test. Normal tactile sensation bilaterally. Nail Exam: Pt has thick disfigured discolored nails with subungual debris noted bilateral entire nail hallux through fifth toenails Ulcer Exam: There is no evidence of ulcer or pre-ulcerative changes or infection. Orthopedic Exam: Muscle tone and strength are WNL. No limitations in general ROM. No crepitus or effusions noted. Foot type and digits show no abnormalities. Bony prominences are unremarkable. Skin: No Porokeratosis. No infection or ulcers  Diagnosis:  Onychomycosis, , Pain in right toe, pain in left toes  Treatment & Plan Procedures and Treatment: Consent by patient was obtained for treatment procedures. The patient understood the discussion of treatment and procedures well. All questions were answered thoroughly reviewed. Debridement of mycotic and hypertrophic toenails, 1 through 5 bilateral and clearing of subungual debris. No ulceration, no infection noted.   Return Visit-Office Procedure: Patient instructed to return to the office for a follow up visit 3 months for continued evaluation and treatment.  Gardiner Barefoot DPM

## 2017-09-23 NOTE — Addendum Note (Signed)
Addended by: Onalee Hua on: 09/23/2017 10:29 AM   Modules accepted: Orders

## 2017-09-23 NOTE — Telephone Encounter (Signed)
Future lab ordered

## 2017-10-02 ENCOUNTER — Other Ambulatory Visit: Payer: Medicare Other

## 2017-10-02 ENCOUNTER — Other Ambulatory Visit (INDEPENDENT_AMBULATORY_CARE_PROVIDER_SITE_OTHER): Payer: Medicare Other

## 2017-10-02 DIAGNOSIS — E875 Hyperkalemia: Secondary | ICD-10-CM

## 2017-10-02 LAB — BASIC METABOLIC PANEL
BUN: 73 mg/dL — AB (ref 6–23)
CHLORIDE: 108 meq/L (ref 96–112)
CO2: 27 mEq/L (ref 19–32)
CREATININE: 2.04 mg/dL — AB (ref 0.40–1.20)
Calcium: 9.6 mg/dL (ref 8.4–10.5)
GFR: 24.04 mL/min — ABNORMAL LOW (ref >60.00)
GLUCOSE: 112 mg/dL — AB (ref 70–99)
Potassium: 5.7 mEq/L — ABNORMAL HIGH (ref 3.5–5.1)
Sodium: 142 mEq/L (ref 135–145)

## 2017-10-03 ENCOUNTER — Other Ambulatory Visit: Payer: Self-pay | Admitting: *Deleted

## 2017-10-03 DIAGNOSIS — E875 Hyperkalemia: Secondary | ICD-10-CM

## 2017-10-03 MED ORDER — PATIROMER SORBITEX CALCIUM 8.4 G PO PACK: PACK | ORAL | 0 refills | Status: DC

## 2017-10-07 ENCOUNTER — Encounter (HOSPITAL_COMMUNITY)
Admission: RE | Admit: 2017-10-07 | Discharge: 2017-10-07 | Disposition: A | Discharge status: Home / Self Care | Payer: Medicare Other | Source: Ambulatory Visit | Attending: Nephrology | Admitting: Nephrology

## 2017-10-07 VITALS — BP 153/54 | HR 57 | Temp 98.0°F | Resp 20

## 2017-10-07 DIAGNOSIS — N184 Chronic kidney disease, stage 4 (severe): Secondary | ICD-10-CM | POA: Insufficient documentation

## 2017-10-07 LAB — RENAL FUNCTION PANEL
Albumin: 3.1 g/dL — ABNORMAL LOW (ref 3.5–5.0)
Anion gap: 7 (ref 5–15)
BUN: 65 mg/dL — ABNORMAL HIGH (ref 6–20)
CALCIUM: 10.1 mg/dL (ref 8.9–10.3)
CO2: 26 mmol/L (ref 22–32)
CREATININE: 2.21 mg/dL — AB (ref 0.44–1.00)
Chloride: 112 mmol/L — ABNORMAL HIGH (ref 101–111)
GFR calc non Af Amer: 18 mL/min — ABNORMAL LOW (ref >60)
GFR, EST AFRICAN AMERICAN: 21 mL/min — AB (ref >60)
GLUCOSE: 100 mg/dL — AB (ref 65–99)
Phosphorus: 3.1 mg/dL (ref 2.5–4.6)
Potassium: 6.5 mmol/L (ref 3.5–5.1)
SODIUM: 145 mmol/L (ref 135–145)

## 2017-10-07 LAB — IRON AND TIBC
IRON: 51 ug/dL (ref 28–170)
Saturation Ratios: 20 % (ref 10.4–31.8)
TIBC: 259 ug/dL (ref 250–450)
UIBC: 208 ug/dL

## 2017-10-07 LAB — POCT HEMOGLOBIN-HEMACUE: Hemoglobin: 9.1 g/dL — ABNORMAL LOW (ref 12.0–15.0)

## 2017-10-07 LAB — FERRITIN: Ferritin: 138 ng/mL (ref 11–307)

## 2017-10-07 MED ORDER — DARBEPOETIN ALFA 100 MCG/0.5ML IJ SOSY
PREFILLED_SYRINGE | INTRAMUSCULAR | Status: AC
  Filled 2017-10-07: qty 0.5

## 2017-10-07 MED ORDER — DARBEPOETIN ALFA 100 MCG/0.5ML IJ SOSY
100.0000 ug | PREFILLED_SYRINGE | INTRAMUSCULAR | Status: DC
  Administered 2017-10-07: 100 ug via SUBCUTANEOUS

## 2017-10-13 DIAGNOSIS — D510 Vitamin B12 deficiency anemia due to intrinsic factor deficiency: Secondary | ICD-10-CM | POA: Diagnosis not present

## 2017-10-13 DIAGNOSIS — N183 Chronic kidney disease, stage 3 (moderate): Secondary | ICD-10-CM | POA: Diagnosis not present

## 2017-10-13 DIAGNOSIS — Z8673 Personal history of transient ischemic attack (TIA), and cerebral infarction without residual deficits: Secondary | ICD-10-CM | POA: Diagnosis not present

## 2017-10-13 DIAGNOSIS — I129 Hypertensive chronic kidney disease with stage 1 through stage 4 chronic kidney disease, or unspecified chronic kidney disease: Secondary | ICD-10-CM | POA: Diagnosis not present

## 2017-10-13 DIAGNOSIS — I872 Venous insufficiency (chronic) (peripheral): Secondary | ICD-10-CM | POA: Diagnosis not present

## 2017-10-13 DIAGNOSIS — Z79899 Other long term (current) drug therapy: Secondary | ICD-10-CM | POA: Diagnosis not present

## 2017-10-13 DIAGNOSIS — Z7902 Long term (current) use of antithrombotics/antiplatelets: Secondary | ICD-10-CM | POA: Diagnosis not present

## 2017-10-16 ENCOUNTER — Encounter: Payer: Self-pay | Admitting: Family Medicine

## 2017-10-16 ENCOUNTER — Other Ambulatory Visit (INDEPENDENT_AMBULATORY_CARE_PROVIDER_SITE_OTHER): Payer: Medicare Other

## 2017-10-16 DIAGNOSIS — E875 Hyperkalemia: Secondary | ICD-10-CM

## 2017-10-16 LAB — BASIC METABOLIC PANEL
BUN: 64 mg/dL — ABNORMAL HIGH (ref 6–23)
CALCIUM: 9.3 mg/dL (ref 8.4–10.5)
CO2: 26 meq/L (ref 19–32)
Chloride: 109 mEq/L (ref 96–112)
Creatinine, Ser: 2.2 mg/dL — ABNORMAL HIGH (ref 0.40–1.20)
GFR: 22.03 mL/min — ABNORMAL LOW (ref >60.00)
GLUCOSE: 95 mg/dL (ref 70–99)
Potassium: 5.8 mEq/L — ABNORMAL HIGH (ref 3.5–5.1)
Sodium: 144 mEq/L (ref 135–145)

## 2017-10-17 ENCOUNTER — Encounter: Payer: Self-pay | Admitting: *Deleted

## 2017-10-28 ENCOUNTER — Encounter (HOSPITAL_COMMUNITY): Payer: Self-pay | Admitting: Emergency Medicine

## 2017-10-28 ENCOUNTER — Observation Stay (HOSPITAL_COMMUNITY)
Admission: EM | Admit: 2017-10-28 | Discharge: 2017-10-30 | Disposition: A | Discharge status: Home / Self Care | Payer: Medicare Other | Attending: Family Medicine | Admitting: Family Medicine

## 2017-10-28 DIAGNOSIS — L03116 Cellulitis of left lower limb: Secondary | ICD-10-CM | POA: Diagnosis not present

## 2017-10-28 DIAGNOSIS — D631 Anemia in chronic kidney disease: Secondary | ICD-10-CM | POA: Insufficient documentation

## 2017-10-28 DIAGNOSIS — K449 Diaphragmatic hernia without obstruction or gangrene: Secondary | ICD-10-CM | POA: Insufficient documentation

## 2017-10-28 DIAGNOSIS — R32 Unspecified urinary incontinence: Secondary | ICD-10-CM | POA: Insufficient documentation

## 2017-10-28 DIAGNOSIS — I5032 Chronic diastolic (congestive) heart failure: Secondary | ICD-10-CM | POA: Insufficient documentation

## 2017-10-28 DIAGNOSIS — F0391 Unspecified dementia with behavioral disturbance: Secondary | ICD-10-CM | POA: Diagnosis not present

## 2017-10-28 DIAGNOSIS — Z7952 Long term (current) use of systemic steroids: Secondary | ICD-10-CM | POA: Diagnosis not present

## 2017-10-28 DIAGNOSIS — Z7902 Long term (current) use of antithrombotics/antiplatelets: Secondary | ICD-10-CM | POA: Diagnosis not present

## 2017-10-28 DIAGNOSIS — D539 Nutritional anemia, unspecified: Secondary | ICD-10-CM | POA: Insufficient documentation

## 2017-10-28 DIAGNOSIS — I4510 Unspecified right bundle-branch block: Secondary | ICD-10-CM | POA: Insufficient documentation

## 2017-10-28 DIAGNOSIS — Z66 Do not resuscitate: Secondary | ICD-10-CM | POA: Diagnosis not present

## 2017-10-28 DIAGNOSIS — M1990 Unspecified osteoarthritis, unspecified site: Secondary | ICD-10-CM | POA: Diagnosis not present

## 2017-10-28 DIAGNOSIS — D510 Vitamin B12 deficiency anemia due to intrinsic factor deficiency: Secondary | ICD-10-CM | POA: Diagnosis not present

## 2017-10-28 DIAGNOSIS — E875 Hyperkalemia: Secondary | ICD-10-CM | POA: Diagnosis not present

## 2017-10-28 DIAGNOSIS — Z884 Allergy status to anesthetic agent status: Secondary | ICD-10-CM | POA: Diagnosis not present

## 2017-10-28 DIAGNOSIS — R600 Localized edema: Secondary | ICD-10-CM | POA: Insufficient documentation

## 2017-10-28 DIAGNOSIS — D649 Anemia, unspecified: Secondary | ICD-10-CM | POA: Diagnosis present

## 2017-10-28 DIAGNOSIS — Z79899 Other long term (current) drug therapy: Secondary | ICD-10-CM | POA: Insufficient documentation

## 2017-10-28 DIAGNOSIS — L0390 Cellulitis, unspecified: Secondary | ICD-10-CM | POA: Diagnosis not present

## 2017-10-28 DIAGNOSIS — R609 Edema, unspecified: Secondary | ICD-10-CM | POA: Diagnosis not present

## 2017-10-28 DIAGNOSIS — I872 Venous insufficiency (chronic) (peripheral): Secondary | ICD-10-CM | POA: Insufficient documentation

## 2017-10-28 DIAGNOSIS — L03115 Cellulitis of right lower limb: Principal | ICD-10-CM | POA: Insufficient documentation

## 2017-10-28 DIAGNOSIS — H9190 Unspecified hearing loss, unspecified ear: Secondary | ICD-10-CM | POA: Insufficient documentation

## 2017-10-28 DIAGNOSIS — Z8673 Personal history of transient ischemic attack (TIA), and cerebral infarction without residual deficits: Secondary | ICD-10-CM | POA: Insufficient documentation

## 2017-10-28 DIAGNOSIS — R52 Pain, unspecified: Secondary | ICD-10-CM | POA: Diagnosis not present

## 2017-10-28 DIAGNOSIS — I878 Other specified disorders of veins: Secondary | ICD-10-CM | POA: Insufficient documentation

## 2017-10-28 DIAGNOSIS — N184 Chronic kidney disease, stage 4 (severe): Secondary | ICD-10-CM | POA: Insufficient documentation

## 2017-10-28 DIAGNOSIS — I130 Hypertensive heart and chronic kidney disease with heart failure and stage 1 through stage 4 chronic kidney disease, or unspecified chronic kidney disease: Secondary | ICD-10-CM | POA: Diagnosis not present

## 2017-10-28 DIAGNOSIS — Z8249 Family history of ischemic heart disease and other diseases of the circulatory system: Secondary | ICD-10-CM | POA: Insufficient documentation

## 2017-10-28 DIAGNOSIS — F03918 Unspecified dementia, unspecified severity, with other behavioral disturbance: Secondary | ICD-10-CM | POA: Diagnosis present

## 2017-10-28 LAB — CBC WITH DIFFERENTIAL/PLATELET
ABS IMMATURE GRANULOCYTES: 0 10*3/uL (ref 0.0–0.1)
BASOS ABS: 0 10*3/uL (ref 0.0–0.1)
Basophils Relative: 1 %
Eosinophils Absolute: 0.1 10*3/uL (ref 0.0–0.7)
Eosinophils Relative: 2 %
HEMATOCRIT: 31.4 % — AB (ref 36.0–46.0)
HEMOGLOBIN: 9.5 g/dL — AB (ref 12.0–15.0)
IMMATURE GRANULOCYTES: 0 %
LYMPHS ABS: 1.2 10*3/uL (ref 0.7–4.0)
Lymphocytes Relative: 25 %
MCH: 32.5 pg (ref 26.0–34.0)
MCHC: 30.3 g/dL (ref 30.0–36.0)
MCV: 107.5 fL — ABNORMAL HIGH (ref 78.0–100.0)
Monocytes Absolute: 0.6 10*3/uL (ref 0.1–1.0)
Monocytes Relative: 13 %
NEUTROS ABS: 2.9 10*3/uL (ref 1.7–7.7)
NEUTROS PCT: 59 %
Platelets: 227 10*3/uL (ref 150–400)
RBC: 2.92 MIL/uL — ABNORMAL LOW (ref 3.87–5.11)
RDW: 12.3 % (ref 11.5–15.5)
WBC: 4.9 10*3/uL (ref 4.0–10.5)

## 2017-10-28 LAB — BASIC METABOLIC PANEL
Anion gap: 7 (ref 5–15)
BUN: 61 mg/dL — ABNORMAL HIGH (ref 8–23)
CHLORIDE: 110 mmol/L (ref 98–111)
CO2: 26 mmol/L (ref 22–32)
Calcium: 9.7 mg/dL (ref 8.9–10.3)
Creatinine, Ser: 2.38 mg/dL — ABNORMAL HIGH (ref 0.44–1.00)
GFR, EST AFRICAN AMERICAN: 19 mL/min — AB (ref >60)
GFR, EST NON AFRICAN AMERICAN: 16 mL/min — AB (ref >60)
Glucose, Bld: 120 mg/dL — ABNORMAL HIGH (ref 70–99)
POTASSIUM: 4.9 mmol/L (ref 3.5–5.1)
SODIUM: 143 mmol/L (ref 135–145)

## 2017-10-28 LAB — I-STAT CG4 LACTIC ACID, ED
LACTIC ACID, VENOUS: 0.79 mmol/L (ref 0.5–1.9)
Lactic Acid, Venous: 0.9 mmol/L (ref 0.5–1.9)

## 2017-10-28 LAB — BRAIN NATRIURETIC PEPTIDE: B NATRIURETIC PEPTIDE 5: 428.3 pg/mL — AB (ref 0.0–100.0)

## 2017-10-28 MED ORDER — SODIUM CHLORIDE 0.9% FLUSH: 3.0000 mL | INTRAVENOUS | Status: DC | PRN

## 2017-10-28 MED ORDER — CLOPIDOGREL BISULFATE 75 MG PO TABS
75.0000 mg | ORAL_TABLET | Freq: Every day | ORAL | Status: DC
  Administered 2017-10-29 – 2017-10-30 (×2): 75 mg via ORAL
  Filled 2017-10-28 (×2): qty 1

## 2017-10-28 MED ORDER — SENNOSIDES-DOCUSATE SODIUM 8.6-50 MG PO TABS
1.0000 | ORAL_TABLET | Freq: Every evening | ORAL | Status: DC | PRN
Start: 2017-10-28 — End: 2017-10-30

## 2017-10-28 MED ORDER — PATIROMER SORBITEX CALCIUM 8.4 G PO PACK
2.0000 | PACK | Freq: Every day | ORAL | Status: DC
  Administered 2017-10-29 – 2017-10-30 (×2): 2 via ORAL
  Filled 2017-10-28 (×2): qty 2

## 2017-10-28 MED ORDER — CEFAZOLIN SODIUM-DEXTROSE 1-4 GM/50ML-% IV SOLN
1.0000 g | Freq: Once | INTRAVENOUS | Status: AC
  Administered 2017-10-28: 1 g via INTRAVENOUS
  Filled 2017-10-28: qty 50

## 2017-10-28 MED ORDER — MULTIVITAMINS PO CAPS: 1.0000 | ORAL_CAPSULE | Freq: Every day | ORAL | Status: DC

## 2017-10-28 MED ORDER — FAMOTIDINE 20 MG PO TABS
10.0000 mg | ORAL_TABLET | Freq: Every day | ORAL | Status: DC
  Administered 2017-10-29 – 2017-10-30 (×2): 10 mg via ORAL
  Filled 2017-10-28 (×2): qty 1

## 2017-10-28 MED ORDER — HYDROCODONE-ACETAMINOPHEN 5-325 MG PO TABS: 1.0000 | ORAL_TABLET | ORAL | Status: DC | PRN

## 2017-10-28 MED ORDER — ACETAMINOPHEN 325 MG PO TABS
650.0000 mg | ORAL_TABLET | Freq: Four times a day (QID) | ORAL | Status: DC | PRN
  Administered 2017-10-29 – 2017-10-30 (×2): 650 mg via ORAL
  Filled 2017-10-28 (×2): qty 2

## 2017-10-28 MED ORDER — CEFAZOLIN SODIUM-DEXTROSE 1-4 GM/50ML-% IV SOLN
1.0000 g | Freq: Two times a day (BID) | INTRAVENOUS | Status: DC
  Administered 2017-10-29 – 2017-10-30 (×3): 1 g via INTRAVENOUS
  Filled 2017-10-28 (×4): qty 50

## 2017-10-28 MED ORDER — FUROSEMIDE 40 MG PO TABS
40.0000 mg | ORAL_TABLET | Freq: Every day | ORAL | Status: DC
  Administered 2017-10-29 – 2017-10-30 (×2): 40 mg via ORAL
  Filled 2017-10-28 (×2): qty 1

## 2017-10-28 MED ORDER — HYDROCORTISONE 0.5 % EX CREA
TOPICAL_CREAM | Freq: Two times a day (BID) | CUTANEOUS | Status: DC
  Administered 2017-10-28 – 2017-10-30 (×4): via TOPICAL
  Filled 2017-10-28: qty 28.35

## 2017-10-28 MED ORDER — HYDRALAZINE HCL 25 MG PO TABS
25.0000 mg | ORAL_TABLET | Freq: Three times a day (TID) | ORAL | Status: DC
  Administered 2017-10-28 – 2017-10-30 (×6): 25 mg via ORAL
  Filled 2017-10-28 (×6): qty 1

## 2017-10-28 MED ORDER — ONDANSETRON HCL 4 MG/2ML IJ SOLN: 4.0000 mg | Freq: Four times a day (QID) | INTRAMUSCULAR | Status: DC | PRN

## 2017-10-28 MED ORDER — FUROSEMIDE 40 MG PO TABS
20.0000 mg | ORAL_TABLET | Freq: Every evening | ORAL | Status: DC
  Administered 2017-10-28 – 2017-10-29 (×2): 20 mg via ORAL
  Filled 2017-10-28 (×2): qty 1

## 2017-10-28 MED ORDER — SODIUM CHLORIDE 0.9% FLUSH
3.0000 mL | Freq: Two times a day (BID) | INTRAVENOUS | Status: DC
  Administered 2017-10-28 – 2017-10-30 (×3): 3 mL via INTRAVENOUS

## 2017-10-28 MED ORDER — OXYBUTYNIN CHLORIDE ER 5 MG PO TB24
5.0000 mg | ORAL_TABLET | Freq: Every day | ORAL | Status: DC
  Administered 2017-10-29 (×2): 5 mg via ORAL
  Filled 2017-10-28 (×3): qty 1

## 2017-10-28 MED ORDER — FUROSEMIDE 10 MG/ML IJ SOLN
40.0000 mg | Freq: Once | INTRAMUSCULAR | Status: AC
  Administered 2017-10-28: 40 mg via INTRAVENOUS
  Filled 2017-10-28: qty 4

## 2017-10-28 MED ORDER — SODIUM CHLORIDE 0.9 % IV SOLN
250.0000 mL | INTRAVENOUS | Status: DC | PRN
  Administered 2017-10-30: 250 mL via INTRAVENOUS

## 2017-10-28 MED ORDER — AMMONIUM LACTATE 12 % EX CREA: TOPICAL_CREAM | Freq: Every day | CUTANEOUS | Status: DC

## 2017-10-28 MED ORDER — CEPHALEXIN 250 MG PO CAPS: 250.0000 mg | ORAL_CAPSULE | Freq: Two times a day (BID) | ORAL | Status: DC

## 2017-10-28 MED ORDER — HEPARIN SODIUM (PORCINE) 5000 UNIT/ML IJ SOLN
5000.0000 [IU] | Freq: Three times a day (TID) | INTRAMUSCULAR | Status: DC
  Administered 2017-10-28 – 2017-10-30 (×5): 5000 [IU] via SUBCUTANEOUS
  Filled 2017-10-28 (×5): qty 1

## 2017-10-28 MED ORDER — DIVALPROEX SODIUM 125 MG PO CSDR
125.0000 mg | DELAYED_RELEASE_CAPSULE | Freq: Every day | ORAL | Status: DC
  Administered 2017-10-29 (×2): 125 mg via ORAL
  Filled 2017-10-28 (×3): qty 1

## 2017-10-28 MED ORDER — ACETAMINOPHEN 650 MG RE SUPP: 650.0000 mg | Freq: Four times a day (QID) | RECTAL | Status: DC | PRN

## 2017-10-28 MED ORDER — BISACODYL 5 MG PO TBEC: 5.0000 mg | DELAYED_RELEASE_TABLET | Freq: Every day | ORAL | Status: DC | PRN

## 2017-10-28 MED ORDER — ONDANSETRON HCL 4 MG PO TABS: 4.0000 mg | ORAL_TABLET | Freq: Four times a day (QID) | ORAL | Status: DC | PRN

## 2017-10-28 NOTE — ED Provider Notes (Signed)
[place] EMERGENCY DEPARTMENT Provider Note   CSN: [id number] Arrival date & time: 10/28/17  1819     History   Chief Complaint Chief Complaint  Patient presents with  . Recurrent Skin Infections    HPI Carrie Grimes is a 84 y.o. female.  ==    Carrie Grimes is a 84 y.o. female with history of chronic venous insufficiency, dementia, hypertension, chronic renal insufficiency, resents to emergency department with complaint of bilateral leg swelling.  Patient is a poor historian but states she has had some pain in her legs.  She states that the pain and swelling has been there for "a while."  I spoke with patient's nursing home, [facility], spoke with her nurse who explained to me that patient has had increased swelling to her legs over the last several days.  She states that the patient is supposed to elevate her legs but she has not been doing so.  She states her legs are more red than usual.  She reports that the left leg became red this morning and has worsened throughout the day.  She states patient is complaining of severe pain to her legs and states that she has not been able to walk today when she usually walks very well with her walker.  Patient is currently not on any antibiotics.  She is not sure if she has been receiving her Lasix recently.  Past Medical History:  Diagnosis Date  . Allergic rhinitis, cause unspecified   . Anemia of chronic kidney failure, stage 4 (severe) (HCC)    Aranesp via nephrologist  . Cataracts, bilateral   . Cerebrovascular disease, unspecified    CDopplers 1/13 showed mild mixed plaque w/ 40-59% (low end) bilat ICA stenoses, the prev right CAE & patch angioplasty were patent  . Choledocholithiasis    12/11/14 CT--nonobstructive (57mm x 6 mm)  . Chronic renal insufficiency, stage IV (severe) (HCC)    Dr. Marval Regal. Not a candidate for renal replacement therapy.  Pt does not want this,  either.  . Dementia with behavioral disturbance    depakote helpful  . Diaphragmatic hernia without mention of obstruction or gangrene   . Essential hypertension   . Hyperkalemia 06/12/2015   diminished renal excretion: as of 05/2016, kayexolate started, low K diet.  Kayexalate changed to veltassa 2019, veltassa increased to 2 packets qd---potassium goal 5.7 or less.  . Impaired mobility    uses walker, wheelchair  . Pernicious anemia    + amemia of CRI  . Pure hypercholesterolemia   . RBBB   . Retinal hemorrhage of right eye   . Unspecified cerebral artery occlusion with cerebral infarction    MRI Brain 12/12 showed large remote right hemispheric infarct w/ encephalomalacia w/ prominent small vessel dis superimposed on atrophy... (residual deficits are diminished sensation on left side, left hand grip weakness,   . Unspecified disorder resulting from impaired renal function    hyperkalemia and anemia  . Unspecified hearing loss   . Unspecified venous (peripheral) insufficiency    lasix  . Unspecified vitamin D deficiency     Patient Active Problem List   Diagnosis Date Noted  . Bradycardia 08/20/2017  . Abnormal serum thyroid stimulating hormone (TSH) level 08/20/2017  . Hyperglycemia 08/20/2017  . Chronic renal insufficiency, stage IV (severe) ([place]) 10/26/2015  . Hyperkalemia 06/12/2015  . Chronic renal insufficiency, stage III (moderate) ([place]) 06/12/2015  . HTN (hypertension), benign 11/02/2013  . Venous stasis dermatitis 11/02/2013  .  Urinary incontinence 09/09/2012  . Dementia with behavioral disturbance 06/26/2011  . Edema 10/23/2010  . DJD (degenerative joint disease) 10/23/2010  . ALLERGIC RHINITIS 10/08/2008  . ANEMIA 08/08/2008  . VITAMIN D DEFICIENCY 07/22/2008  . Presbycusis of both ears 07/22/2008  . RENAL INSUFFICIENCY 10/08/2007  . HYPERCHOLESTEROLEMIA 04/12/2007  . Pernicious anemia 04/12/2007  . CEREBROVASCULAR DISEASE 04/12/2007  . Chronic venous  insufficiency 04/09/2007  . STROKE 02/06/2007  . HIATAL HERNIA 02/06/2007    Past Surgical History:  Procedure Laterality Date  . CAROTID ENDARTERECTOMY    . CHOLECYSTECTOMY    . ENDOSCOPIC RETROGRADE CHOLANGIOPANCREATOGRAPHY (ERCP) WITH PROPOFOL N/A 03/09/2015   Procedure: ENDOSCOPIC RETROGRADE CHOLANGIOPANCREATOGRAPHY (ERCP) WITH PROPOFOL;  Surgeon: Milus Banister, MD;  Location: [facility] ENDOSCOPY;  Service: Endoscopy;  Laterality: N/A;  . laser surgery on right eye  12/02/2012   Dr. [facility]     OB History   None      Home Medications    Prior to Admission medications   Medication Sig Start Date End Date Taking? Authorizing Provider  acetaminophen (TYLENOL) 500 MG tablet Take 1,000 mg by mouth. Three times daily and as needed for pain    [provider]  ammonium lactate (AMLACTIN) 12 % cream Apply topically to lower legs at bedtime every night for dry/thick skin 06/12/17   McGowen, Adrian Blackwater, MD  cetirizine (ZYRTEC) 10 MG tablet Take 10 mg by mouth daily.    [provider]  clopidogrel (PLAVIX) 75 MG tablet Take 1 tablet (75 mg total) by mouth daily. RESTART THIS IN 3 DAYS 03/09/15   Milus Banister, MD  cyanocobalamin (,VITAMIN B-12,) 1000 MCG/ML injection Inject 1,000 mcg into the skin every 30 (thirty) days. On the 12th of every month.    [provider]  divalproex (DEPAKOTE SPRINKLE) 125 MG capsule 1 tab po qhs Patient taking differently: Take 125 mg by mouth at bedtime. 1 tab po qhs 10/24/16   McGowen, Adrian Blackwater, MD  fluticasone (CUTIVATE) 0.05 % cream Apply to affected pinkish rash of both lower extremities as needed 05/22/17   McGowen, Adrian Blackwater, MD  furosemide (LASIX) 20 MG tablet Take 1 tablet by mouth daily. 09/04/17   [provider]  furosemide (LASIX) 40 MG tablet Take 1 tablet (40 mg total) by mouth daily. 11/18/16   McGowen, Adrian Blackwater, MD  hydrALAZINE (APRESOLINE) 25 MG tablet Take 1 tablet (25 mg total) by mouth 3  (three) times daily. 06/25/16   McGowen, Adrian Blackwater, MD  loperamide (IMODIUM A-D) 2 MG tablet Take 2 mg by mouth daily.     [provider]  Multiple Vitamin (MULTIVITAMIN) capsule Take 1 capsule by mouth daily.      [provider]  oxybutynin (DITROPAN-XL) 5 MG 24 hr tablet Take 5 mg by mouth at bedtime.  05/13/17   [provider]  patiromer Daryll Drown) 8.4 g packet 2 packets po qd.  Do not give within 3 hours of taking any other medications.  May give with OR without food. 10/03/17   McGowen, Adrian Blackwater, MD  ranitidine (ZANTAC) 75 MG tablet Take 75 mg by mouth daily.    [provider]  vitamin C (ASCORBIC ACID) 500 MG tablet Take 500 mg by mouth daily.     [provider]    Family History Family History  Problem Relation Age of Onset  . Heart disease Mother   . Heart disease Father     Social History Social History  Tobacco Use  . Smoking status: Never Smoker  . Smokeless tobacco: Never Used  Substance Use Topics  . Alcohol use: No  . Drug use: No     Allergies   Procaine hcl   Review of Systems Review of Systems  Constitutional: Negative for chills and fever.  Respiratory: Negative for cough, chest tightness and shortness of breath.   Cardiovascular: Positive for leg swelling. Negative for chest pain and palpitations.  Gastrointestinal: Negative for abdominal pain, diarrhea, nausea and vomiting.  Genitourinary: Negative for dysuria, flank pain, pelvic pain, vaginal bleeding, vaginal discharge and vaginal pain.  Musculoskeletal: Positive for myalgias. Negative for arthralgias, neck pain and neck stiffness.  Skin: Negative for rash.  Neurological: Negative for dizziness, weakness and headaches.  All other systems reviewed and are negative.    Physical Exam Updated Vital Signs SpO2 97%   Physical Exam  Constitutional: She appears well-developed and well-nourished. No distress.  HENT:  Head: Normocephalic.  Eyes:  Conjunctivae are normal.  Neck: Neck supple.  Cardiovascular: Normal rate and regular rhythm.  Murmur heard. Pulmonary/Chest: Effort normal and breath sounds normal. No respiratory distress. She has no wheezes. She has no rales.  Abdominal: Soft. Bowel sounds are normal. She exhibits no distension. There is no tenderness. There is no rebound.  Musculoskeletal: She exhibits edema.  3+ pitting edema bilaterally, right shin is weeping minimally.  Bilateral lower extremities are erythematous and warm to the touch, left worse than right.  Dorsal pedal pulses are not palpable, however capillary refill is brisk, less than 2 seconds distally.  Neurological: She is alert.  Skin: Skin is warm and dry.  Psychiatric: She has a normal mood and affect. Her behavior is normal.  Nursing note and vitals reviewed.    ED Treatments / Results  Labs (all labs ordered are listed, but only abnormal results are displayed) Labs Reviewed  CBC WITH DIFFERENTIAL/PLATELET - Abnormal; Notable for the following components:      Result Value   RBC 2.92 (*)    Hemoglobin 9.5 (*)    HCT 31.4 (*)    MCV 107.5 (*)    All other components within normal limits  BASIC METABOLIC PANEL - Abnormal; Notable for the following components:   Glucose, Bld 120 (*)    BUN 61 (*)    Creatinine, Ser 2.38 (*)    GFR calc non Af Amer 16 (*)    GFR calc Af Amer 19 (*)    All other components within normal limits  BRAIN NATRIURETIC PEPTIDE - Abnormal; Notable for the following components:   B Natriuretic Peptide 428.3 (*)    All other components within normal limits  BASIC METABOLIC PANEL - Abnormal; Notable for the following components:   Chloride 113 (*)    Glucose, Bld 110 (*)    BUN 60 (*)    Creatinine, Ser 2.28 (*)    GFR calc non Af Amer 17 (*)    GFR calc Af Amer 20 (*)    All other components within normal limits  CBC WITH DIFFERENTIAL/PLATELET - Abnormal; Notable for the following components:   RBC 2.58 (*)     Hemoglobin 8.5 (*)    HCT 27.5 (*)    MCV 106.6 (*)    All other components within normal limits  GLUCOSE, CAPILLARY - Abnormal; Notable for the following components:   Glucose-Capillary 132 (*)    All other components within normal limits  MRSA PCR SCREENING  CBC WITH DIFFERENTIAL/PLATELET  BASIC METABOLIC PANEL  I-STAT CG4 LACTIC ACID, ED  I-STAT CG4 LACTIC ACID, ED    EKG EKG Interpretation  Date/Time:  Tuesday October 28 2017 18:33:02 EDT Ventricular Rate:  68 PR Interval:    QRS Duration: 135 QT Interval:  401 QTC Calculation: 427 R Axis:   11 Text Interpretation:  Sinus rhythm Prolonged PR interval Right bundle branch block No significant change since last tracing Abnormal ekg Confirmed by Carmin Muskrat [id number]) on 10/28/2017 8:35:23 PM   Radiology No results found.  Procedures Procedures (including critical care time)  Medications Ordered in ED Medications - No data to display   Initial Impression / Assessment and Plan / ED Course  I have reviewed the triage vital signs and the nursing notes.  Pertinent labs & imaging results that were available during my care of the patient were reviewed by me and considered in my medical decision making (see chart for details).     Patient with worsening erythema and swelling to bilateral lower extremities, history of peripheral vascular disease with chronic edema based on chart review.  According to the nurse at her facility, redness and swelling has gotten much worse in the last few days and even worsened today.  Patient has not been able to get out of bed today due to pain.  Will check labs, will monitor.  Labs with no significant abnormalities. Question cellulitis vs fluid overload. Will treat with lasix and antibiotics. Will admit.   Spoke with [facility], will admit.  Vitals:   10/29/17 0552 10/29/17 1025 10/29/17 1903 10/29/17 2056  BP: (!) 167/49 (!) 158/50 (!) 123/40 (!) 125/50  Pulse: 64 (!) 58 66 65  Resp: 16 16 16 17     Temp: 98 F (36.7 C) 97.8 F (36.6 C) 98.8 F (37.1 C) 99 F (37.2 C)  TempSrc:  Oral Oral Oral  SpO2: 99% 98% 98% 98%  Weight:      Height:         Final Clinical Impressions(s) / ED Diagnoses   Final diagnoses:  Cellulitis of both lower extremities    ED Discharge Orders    None       Jeannett Senior, PA-C 10/30/17 0026    Carmin Muskrat, MD 10/30/17 [phone]

## 2017-10-28 NOTE — ED Triage Notes (Signed)
Pt arrives via EMS from [facility] living with reports of worsening lower leg edema and redness, mainly the left leg. Pt takes medications for fluid and cellulitis but the pain and swelling increased this AM.

## 2017-10-28 NOTE — ED Notes (Signed)
Pt reports falling 2 weeks ago while making her bed but did not think she hurt anything.

## 2017-10-28 NOTE — H&P (Addendum)
History and Physical    Carrie Grimes QQP:[id number] DOB: 1920/12/31 DOA: 10/28/2017  PCP: Tammi Sou, MD   Patient coming from: ALF   Chief Complaint: Bilateral lower leg with increase in chronic swelling and redness   HPI: Carrie Grimes is a 84 y.o. female with medical history significant for chronic kidney disease stage IV, anemia, history of CVA, dementia with behavioral disturbance, and chronic venous stasis dermatitis, now presenting to the emergency department for evaluation of increase in her chronic lower extremity swelling and redness.  Per report of the nursing home, patient has difficulty being compliant with leg elevation, is normally able to ambulate with a walker, but has had recent increase in swelling and redness to the bilateral lower extremities with reports of leg pain and decreased activity.  No fevers have been documented.  Patient has not appeared short of breath.  No interventions have been attempted for the symptoms yet.  ED Course: Upon arrival to the ED, patient is found to be afebrile, saturating well on room air, and with vitals otherwise normal.  EKG features a sinus rhythm with RBBB. Chemistry panel is notable for a creatinine of 2.38, slightly up from priors in the 2-2.2 range.  CBC features a stable chronic anemia with hemoglobin of 9.5.  Lactic acid is reassuringly normal x2.  BNP is elevated 428.  Patient was given 40 mg IV Lasix and Ancef in the ED.  She remains hemodynamically stable, in no apparent respiratory distress, and will be observed on the medical-surgical unit for ongoing evaluation and management.   Review of Systems:  All other systems reviewed and apart from HPI, are negative.  Past Medical History:  Diagnosis Date  . Allergic rhinitis, cause unspecified   . Anemia of chronic kidney failure, stage 4 (severe) (HCC)    Aranesp via nephrologist  . Cataracts, bilateral   . Cerebrovascular disease, unspecified    CDopplers  1/13 showed mild mixed plaque w/ 40-59% (low end) bilat ICA stenoses, the prev right CAE & patch angioplasty were patent  . Choledocholithiasis    12/11/14 CT--nonobstructive (16mm x 6 mm)  . Chronic renal insufficiency, stage IV (severe) (HCC)    Dr. Marval Regal. Not a candidate for renal replacement therapy.  Pt does not want this, either.  . Dementia with behavioral disturbance    depakote helpful  . Diaphragmatic hernia without mention of obstruction or gangrene   . Essential hypertension   . Hyperkalemia 06/12/2015   diminished renal excretion: as of 05/2016, kayexolate started, low K diet.  Kayexalate changed to veltassa 2019, veltassa increased to 2 packets qd---potassium goal 5.7 or less.  . Impaired mobility    uses walker, wheelchair  . Pernicious anemia    + amemia of CRI  . Pure hypercholesterolemia   . RBBB   . Retinal hemorrhage of right eye   . Unspecified cerebral artery occlusion with cerebral infarction    MRI Brain 12/12 showed large remote right hemispheric infarct w/ encephalomalacia w/ prominent small vessel dis superimposed on atrophy... (residual deficits are diminished sensation on left side, left hand grip weakness,   . Unspecified disorder resulting from impaired renal function    hyperkalemia and anemia  . Unspecified hearing loss   . Unspecified venous (peripheral) insufficiency    lasix  . Unspecified vitamin D deficiency     Past Surgical History:  Procedure Laterality Date  . CAROTID ENDARTERECTOMY    . CHOLECYSTECTOMY    . ENDOSCOPIC RETROGRADE CHOLANGIOPANCREATOGRAPHY (ERCP) WITH  PROPOFOL N/A 03/09/2015   Procedure: ENDOSCOPIC RETROGRADE CHOLANGIOPANCREATOGRAPHY (ERCP) WITH PROPOFOL;  Surgeon: Milus Banister, MD;  Location: [facility] END[facility];  Service: Endoscopy;  Laterality: N/A;  . laser surgery on right eye  12/02/2012   Dr. [facility]aird Cancer at [facility]     reports that she has never smoked. She has never used smokeless tobacco. She reports that she  does not drink alcohol or use drugs.  Allergies  Allergen Reactions  . Procaine Hcl Other (See Comments)    REACTION: pt states reaction to shot at dentist office w/ tachycardia \\T \ SOB (? if really from combination w/epi)    Family History  Problem Relation Age of Onset  . Heart disease Mother   . Heart disease Father      Prior to Admission medications   Medication Sig Start Date End Date Taking? Authorizing Provider  acetaminophen (TYLENOL) 500 MG tablet Take 1,000 mg by mouth. Three times daily and as needed for pain    [provider]  ammonium lactate (AMLACTIN) 12 % cream Apply topically to lower legs at bedtime every night for dry/thick skin 06/12/17   McGowen, Adrian Blackwater, MD  cetirizine (ZYRTEC) 10 MG tablet Take 10 mg by mouth daily.    [provider]  clopidogrel (PLAVIX) 75 MG tablet Take 1 tablet (75 mg total) by mouth daily. RESTART THIS IN 3 DAYS 03/09/15   Milus Banister, MD  cyanocobalamin (,VITAMIN B-12,) 1000 MCG/ML injection Inject 1,000 mcg into the skin every 30 (thirty) days. On the 12th of every month.    [provider]  divalproex (DEPAKOTE SPRINKLE) 125 MG capsule 1 tab po qhs Patient taking differently: Take 125 mg by mouth at bedtime. 1 tab po qhs 10/24/16   McGowen, Adrian Blackwater, MD  fluticasone (CUTIVATE) 0.05 % cream Apply to affected pinkish rash of both lower extremities as needed 05/22/17   McGowen, Adrian Blackwater, MD  furosemide (LASIX) 20 MG tablet Take 1 tablet by mouth daily. 09/04/17   [provider]  furosemide (LASIX) 40 MG tablet Take 1 tablet (40 mg total) by mouth daily. 11/18/16   McGowen, Adrian Blackwater, MD  hydrALAZINE (APRESOLINE) 25 MG tablet Take 1 tablet (25 mg total) by mouth 3 (three) times daily. 06/25/16   McGowen, Adrian Blackwater, MD  loperamide (IMODIUM A-D) 2 MG tablet Take 2 mg by mouth daily.     [provider]  Multiple Vitamin (MULTIVITAMIN) capsule Take 1 capsule by mouth daily.      [provider]  oxybutynin (DITROPAN-XL) 5 MG 24 hr tablet Take 5 mg by mouth at bedtime.  05/13/17   [provider]  patiromer Daryll Drown) 8.4 g packet 2 packets po qd.  Do not give within 3 hours of taking any other medications.  May give with OR without food. 10/03/17   McGowen, Adrian Blackwater, MD  ranitidine (ZANTAC) 75 MG tablet Take 75 mg by mouth daily.    [provider]  vitamin C (ASCORBIC ACID) 500 MG tablet Take 500 mg by mouth daily.     [provider]    Physical Exam: Vitals:   10/28/17 1915 10/28/17 1918 10/28/17 2000 10/28/17 2030  BP: (!) 178/98  (!) 163/51 (!) 172/57  Pulse: 61  62 63  Resp: 15  16 15   Temp:  97.6 F (36.4 C)    TempSrc:  Rectal    SpO2: 94%  96% 97%  Weight:  Constitutional: NAD, calm  Eyes: PERTLA, lids and conjunctivae normal ENMT: Mucous membranes are moist. Posterior pharynx clear of any exudate or lesions.   Neck: normal, supple, no masses, no thyromegaly Respiratory: clear to auscultation bilaterally, no wheezing, no crackles. Normal respiratory effort.    Cardiovascular: S1 & S2 heard, regular rate and rhythm. No significant JVD. Abdomen: No distension, no tenderness, soft. Bowel sounds normal.  Musculoskeletal: no clubbing / cyanosis. No red, hot, swollen joint.    Skin: Bilateral lower leg erythema, edema, and tenderness, left > right. Warm, dry, well-perfused. Neurologic: Gross hearing deficit. Sensation intact. Marland Kitchen  Psychiatric: Alert and oriented person only. Labile emotions.     Labs on Admission: I have personally reviewed following labs and imaging studies  CBC: Recent Labs  Lab 10/28/17 1828  WBC 4.9  NEUTROABS 2.9  HGB 9.5*  HCT 31.4*  MCV 107.5*  PLT 299   Basic Metabolic Panel: Recent Labs  Lab 10/28/17 1828  NA 143  K 4.9  CL 110  CO2 26  GLUCOSE 120*  BUN 61*  CREATININE 2.38*  CALCIUM 9.7   GFR: Estimated Creatinine Clearance: 11.3 mL/min (A) (by C-G formula based on SCr of 2.38  mg/dL (H)). Liver Function Tests: No results for input(s): AST, ALT, ALKPHOS, BILITOT, PROT, ALBUMIN in the last 168 hours. No results for input(s): LIPASE, AMYLASE in the last 168 hours. No results for input(s): AMMONIA in the last 168 hours. Coagulation Profile: No results for input(s): INR, PROTIME in the last 168 hours. Cardiac Enzymes: No results for input(s): CKTOTAL, CKMB, CKMBINDEX, TROPONINI in the last 168 hours. BNP (last 3 results) No results for input(s): PROBNP in the last 8760 hours. HbA1C: No results for input(s): HGBA1C in the last 72 hours. CBG: No results for input(s): GLUCAP in the last 168 hours. Lipid Profile: No results for input(s): CHOL, HDL, LDLCALC, TRIG, CHOLHDL, LDLDIRECT in the last 72 hours. Thyroid Function Tests: No results for input(s): TSH, T4TOTAL, FREET4, T3FREE, THYROIDAB in the last 72 hours. Anemia Panel: No results for input(s): VITAMINB12, FOLATE, FERRITIN, TIBC, IRON, RETICCTPCT in the last 72 hours. Urine analysis:    Component Value Date/Time   COLORURINE STRAW (A) 08/20/2017 1533   APPEARANCEUR CLEAR 08/20/2017 1533   LABSPEC 1.010 08/20/2017 1533   PHURINE 5.0 08/20/2017 1533   GLUCOSEU NEGATIVE 08/20/2017 1533   GLUCOSEU NEGATIVE 01/06/2012 1620   HGBUR NEGATIVE 08/20/2017 1533   BILIRUBINUR NEGATIVE 08/20/2017 1533   KETONESUR NEGATIVE 08/20/2017 1533   PROTEINUR NEGATIVE 08/20/2017 1533   UROBILINOGEN 0.2 01/06/2012 1620   NITRITE NEGATIVE 08/20/2017 1533   LEUKOCYTESUR TRACE (A) 08/20/2017 1533   Sepsis Labs: @LABRCNTIP (procalcitonin:4,lacticidven:4) )No results found for this or any previous visit (from the past 240 hour(s)).   Radiological Exams on Admission: No results found.  EKG: Independently reviewed. Sinus rhythm, RBBB.   Assessment/Plan   1. Stasis dermatitis, ?mild superimposed cellulitis  - Presents from ALF with increase in her chronic swelling and redness to bilateral LE's, left > right - There was  concern for possible superimposed cellulitis and she was treated with Ancef in ED  - Elevate legs, continue steroid cream, recommend compression stockings, check venous US given asymmetry and tenderness, continue empiric antibiotic, follow clinical course    2. CKD stage IV  - SCr is 2.38 on admission, slightly up from priors in 2-2.2 range  - Potassium and bicarb wnl, BP controlled, no pulm edema  - Renally-dose medications, avoid nephrotoxins, continue patiromer and Lasix  3. Macrocytic anemia  - Hgb is stable at 9.5 on admission with no bleeding  - Continue vitamin supplementation    4. Dementia with behavioral disturbance  - Stable, continue Depakote    5. Hx of CVA  - Continue Plavix     DVT prophylaxis: sq heparin  Code Status: DNR  Family Communication: Son and daughter called at listed #'s without answer  Consults called: None Admission status: Observation   Vianne Bulls, MD [facility] Pager [phone]  If 7PM-7AM, please contact night-coverage [email or web address] Password [email or web address]  10/28/2017, 9:01 PM

## 2017-10-29 ENCOUNTER — Other Ambulatory Visit: Payer: Self-pay

## 2017-10-29 ENCOUNTER — Observation Stay (HOSPITAL_BASED_OUTPATIENT_CLINIC_OR_DEPARTMENT_OTHER): Payer: Medicare Other

## 2017-10-29 DIAGNOSIS — R609 Edema, unspecified: Secondary | ICD-10-CM

## 2017-10-29 DIAGNOSIS — L03115 Cellulitis of right lower limb: Secondary | ICD-10-CM | POA: Diagnosis not present

## 2017-10-29 DIAGNOSIS — L03116 Cellulitis of left lower limb: Secondary | ICD-10-CM | POA: Diagnosis not present

## 2017-10-29 DIAGNOSIS — I872 Venous insufficiency (chronic) (peripheral): Secondary | ICD-10-CM | POA: Diagnosis not present

## 2017-10-29 LAB — CBC WITH DIFFERENTIAL/PLATELET
Abs Immature Granulocytes: 0 10*3/uL (ref 0.0–0.1)
Basophils Absolute: 0 10*3/uL (ref 0.0–0.1)
Basophils Relative: 1 %
EOS PCT: 3 %
Eosinophils Absolute: 0.1 10*3/uL (ref 0.0–0.7)
HEMATOCRIT: 27.5 % — AB (ref 36.0–46.0)
HEMOGLOBIN: 8.5 g/dL — AB (ref 12.0–15.0)
Immature Granulocytes: 0 %
LYMPHS ABS: 1.5 10*3/uL (ref 0.7–4.0)
LYMPHS PCT: 34 %
MCH: 32.9 pg (ref 26.0–34.0)
MCHC: 30.9 g/dL (ref 30.0–36.0)
MCV: 106.6 fL — ABNORMAL HIGH (ref 78.0–100.0)
MONO ABS: 0.7 10*3/uL (ref 0.1–1.0)
Monocytes Relative: 15 %
Neutro Abs: 2.1 10*3/uL (ref 1.7–7.7)
Neutrophils Relative %: 47 %
Platelets: 194 10*3/uL (ref 150–400)
RBC: 2.58 MIL/uL — ABNORMAL LOW (ref 3.87–5.11)
RDW: 12.3 % (ref 11.5–15.5)
WBC: 4.5 10*3/uL (ref 4.0–10.5)

## 2017-10-29 LAB — BASIC METABOLIC PANEL
Anion gap: 5 (ref 5–15)
BUN: 60 mg/dL — AB (ref 8–23)
CHLORIDE: 113 mmol/L — AB (ref 98–111)
CO2: 27 mmol/L (ref 22–32)
Calcium: 9.2 mg/dL (ref 8.9–10.3)
Creatinine, Ser: 2.28 mg/dL — ABNORMAL HIGH (ref 0.44–1.00)
GFR calc Af Amer: 20 mL/min — ABNORMAL LOW (ref >60)
GFR calc non Af Amer: 17 mL/min — ABNORMAL LOW (ref >60)
GLUCOSE: 110 mg/dL — AB (ref 70–99)
POTASSIUM: 4.4 mmol/L (ref 3.5–5.1)
Sodium: 145 mmol/L (ref 135–145)

## 2017-10-29 LAB — MRSA PCR SCREENING: MRSA by PCR: NEGATIVE

## 2017-10-29 LAB — GLUCOSE, CAPILLARY: GLUCOSE-CAPILLARY: 132 mg/dL — AB (ref 70–99)

## 2017-10-29 NOTE — Progress Notes (Signed)
Preliminary report by tech- Bilateral venous duplex completed. Limited visualization in the posterior tibial and peroneal veins bilaterally. Appears to be negative.  Abram Sander 10/29/2017 10:33 AM

## 2017-10-29 NOTE — Care Management Note (Signed)
Case Management Note  Patient Details  Name: Carrie Grimes MRN: [id number] Date of Birth: 08-Jul-1920  Subjective/Objective:              Patient in obs for LE swelling/ cellulitis. Resides at [facility].       Action/Plan:   Expected Discharge Date:                  Expected Discharge Plan:  Assisted Living / Rest Home([facility])  In-House Referral:  Clinical Social Work  Discharge planning Services  CM Consult  Post Acute Care Choice:    Choice offered to:     DME Arranged:    DME Agency:     HH Arranged:    [place] Agency:     Status of Service:  In process, will continue to follow  If discussed at Long Length of Stay Meetings, dates discussed:    Additional Comments:  Carles Collet, RN 10/29/2017, 8:15 AM

## 2017-10-29 NOTE — Progress Notes (Signed)
PROGRESS NOTE    Carrie Grimes  VQQ:[id number] DOB: [birth year] DOA: 10/28/2017 PCP: Tammi Sou, MD   Brief [place] y.o. female with medical history significant for chronic kidney disease stage IV, anemia, history of CVA, dementia with behavioral disturbance, and chronic venous stasis dermatitis, now presenting to the emergency department for evaluation of increase in her chronic lower extremity swelling and redness.  Per report of the nursing home, patient has difficulty being compliant with leg elevation, is normally able to ambulate with a walker, but has had recent increase in swelling and redness to the bilateral lower extremities with reports of leg pain and decreased activity.  No fevers have been documented.  Patient has not appeared short of breath.  No interventions have been attempted for the symptoms yet.  ED Course: Upon arrival to the ED, patient is found to be afebrile, saturating well on room air, and with vitals otherwise normal.  EKG features a sinus rhythm with RBBB. Chemistry panel is notable for a creatinine of 2.38, slightly up from priors in the 2-2.2 range.  CBC features a stable chronic anemia with hemoglobin of 9.5.  Lactic acid is reassuringly normal x2.  BNP is elevated 428.  Patient was given 40 mg IV Lasix and Ancef in the ED.  She remains hemodynamically stable, in no apparent respiratory distress, and will be observed on the medical-surgical unit for ongoing evaluation and management   Assessment & Plan:   Principal Problem:   Venous stasis dermatitis Active Problems:   History of completed stroke   Chronic venous insufficiency   CKD (chronic kidney disease), stage IV (HCC)   ANEMIA   Dementia with behavioral disturbance   Cellulitis of both lower extremities   Asymmetric edema of both lower extremities  1. Stasis dermatitis, ?mild superimposed cellulitis  - Presents from ALF with increase in her chronic swelling and redness to bilateral  LE's, left > right - There was concern for possible superimposed cellulitis and she was treated with Ancef in ED  - Elevate legs, continue steroid cream, recommend compression stockings,  venous US  Pending,continue empiric antibiotic, follow clinical course -Suspect she probably does have some chronic lower extremity edema and erythema.  2. CKD stage IV  - SCr is 2.38 on admission, slightly up from priors in 2-2.2 range  - Potassium and bicarb wnl, BP controlled, no pulm edema  - Renally-dose medications, avoid nephrotoxins, continue patiromer and Lasix    3. Macrocytic anemia  - Hgb is at 9.5 on admission with no bleeding down to 8.5.  4. Dementia with behavioral disturbance  - Stable, continue Depakote    5. Hx of CVA  - Continue Plavix        DVT prophylaxis heparin Code Status: DO NOT RESUSCITATE  family Communication: No family available Disposition Plan: TBD  Consultants: None  Procedures: None Antimicrobials: Ancef  Subjective: Very pleasant sweet young lady in no acute distress.   Objective: Vitals:   10/28/17 2214 10/29/17 0004 10/29/17 0552 10/29/17 1025  BP: (!) 175/72  (!) 167/49 (!) 158/50  Pulse: 65  64 (!) 58  Resp: 16  16 16   Temp: 98.4 F (36.9 C)  98 F (36.7 C) 97.8 F (36.6 C)  TempSrc: Oral   Oral  SpO2: 98%  99% 98%  Weight: 53.8 kg (118 lb 9.7 oz)     Height:  5\' 7"  (1.702 m)      Intake/Output Summary (Last 24 hours) at 10/29/2017 1234 Last data filed  at 10/29/2017 0600 Gross per 24 hour  Intake 100 ml  Output 800 ml  Net -700 ml   Filed Weights   10/28/17 1911 10/28/17 2214  Weight: 50.8 kg (112 lb) 53.8 kg (118 lb 9.7 oz)    Examination:  General exam: Appears calm and comfortable  Respiratory system: Clear to auscultation. Respiratory effort normal. Cardiovascular system: S1 & S2 heard, RRR. No JVD, murmurs, rubs, gallops or clicks. No pedal edema. Gastrointestinal system: Abdomen is nondistended, soft and nontender. No  organomegaly or masses felt. Normal bowel sounds heard. Central nervous system: Alert and oriented. No focal neurological deficits. Extremities: 3+ edema with erythema anterior both leg Skin: No rashes, lesions or ulcers Psychiatry: Judgement and insight appear normal. Mood & affect appropriate.     Data Reviewed: I have personally reviewed following labs and imaging studies  CBC: Recent Labs  Lab 10/28/17 1828 10/29/17 0402  WBC 4.9 4.5  NEUTROABS 2.9 2.1  HGB 9.5* 8.5*  HCT 31.4* 27.5*  MCV 107.5* 106.6*  PLT 227 408   Basic Metabolic Panel: Recent Labs  Lab 10/28/17 1828 10/29/17 0402  NA 143 145  K 4.9 4.4  CL 110 113*  CO2 26 27  GLUCOSE 120* 110*  BUN 61* 60*  CREATININE 2.38* 2.28*  CALCIUM 9.7 9.2   GFR: Estimated Creatinine Clearance: 12.5 mL/min (A) (by C-G formula based on SCr of 2.28 mg/dL (H)). Liver Function Tests: No results for input(s): AST, ALT, ALKPHOS, BILITOT, PROT, ALBUMIN in the last 168 hours. No results for input(s): LIPASE, AMYLASE in the last 168 hours. No results for input(s): AMMONIA in the last 168 hours. Coagulation Profile: No results for input(s): INR, PROTIME in the last 168 hours. Cardiac Enzymes: No results for input(s): CKTOTAL, CKMB, CKMBINDEX, TROPONINI in the last 168 hours. BNP (last 3 results) No results for input(s): PROBNP in the last 8760 hours. HbA1C: No results for input(s): HGBA1C in the last 72 hours. CBG: No results for input(s): GLUCAP in the last 168 hours. Lipid Profile: No results for input(s): CHOL, HDL, LDLCALC, TRIG, CHOLHDL, LDLDIRECT in the last 72 hours. Thyroid Function Tests: No results for input(s): TSH, T4TOTAL, FREET4, T3FREE, THYROIDAB in the last 72 hours. Anemia Panel: No results for input(s): VITAMINB12, FOLATE, FERRITIN, TIBC, IRON, RETICCTPCT in the last 72 hours. Sepsis Labs: Recent Labs  Lab 10/28/17 1846 10/28/17 2029  LATICACIDVEN 0.79 0.90    Recent Results (from the past 240  hour(s))  MRSA PCR Screening     Status: None   Collection Time: 10/28/17 10:25 PM  Result Value Ref Range Status   MRSA by PCR NEGATIVE NEGATIVE Final    Comment:        The GeneXpert MRSA Assay (FDA approved for NASAL specimens only), is one component of a comprehensive MRSA colonization surveillance program. It is not intended to diagnose MRSA infection nor to guide or monitor treatment for MRSA infections. Performed at [facility], [place] [address], [place], [place] [address]          Radiology Studies: No results found.      Scheduled Meds: . clopidogrel  75 mg Oral Daily  . divalproex  125 mg Oral QHS  . famotidine  10 mg Oral Daily  . furosemide  20 mg Oral QPM  . furosemide  40 mg Oral Daily  . heparin  5,000 Units Subcutaneous Q8H  . hydrALAZINE  25 mg Oral TID  . hydrocortisone cream   Topical BID  .  oxybutynin  5 mg Oral QHS  . patiromer  2 packet Oral Daily  . sodium chloride flush  3 mL Intravenous Q12H   Continuous Infusions: . sodium chloride    .  ceFAZolin (ANCEF) IV 1 g (10/29/17 5053)     LOS: 0 days     Georgette Shell, MD [facility] If 7PM-7AM, please contact night-coverage [email or web address] Password [facility] 10/29/2017, 12:34 PM

## 2017-10-29 NOTE — Progress Notes (Signed)
Ace wraps on BLE to help decrease swelling

## 2017-10-30 DIAGNOSIS — L03115 Cellulitis of right lower limb: Secondary | ICD-10-CM | POA: Diagnosis not present

## 2017-10-30 DIAGNOSIS — I872 Venous insufficiency (chronic) (peripheral): Secondary | ICD-10-CM | POA: Diagnosis not present

## 2017-10-30 DIAGNOSIS — R279 Unspecified lack of coordination: Secondary | ICD-10-CM | POA: Diagnosis not present

## 2017-10-30 DIAGNOSIS — Z743 Need for continuous supervision: Secondary | ICD-10-CM | POA: Diagnosis not present

## 2017-10-30 LAB — CBC WITH DIFFERENTIAL/PLATELET
Abs Immature Granulocytes: 0 10*3/uL (ref 0.0–0.1)
BASOS PCT: 1 %
Basophils Absolute: 0.1 10*3/uL (ref 0.0–0.1)
EOS ABS: 0.2 10*3/uL (ref 0.0–0.7)
EOS PCT: 3 %
HCT: 26.7 % — ABNORMAL LOW (ref 36.0–46.0)
Hemoglobin: 8.1 g/dL — ABNORMAL LOW (ref 12.0–15.0)
Immature Granulocytes: 0 %
LYMPHS ABS: 1.6 10*3/uL (ref 0.7–4.0)
Lymphocytes Relative: 34 %
MCH: 32.5 pg (ref 26.0–34.0)
MCHC: 30.3 g/dL (ref 30.0–36.0)
MCV: 107.2 fL — AB (ref 78.0–100.0)
MONOS PCT: 14 %
Monocytes Absolute: 0.7 10*3/uL (ref 0.1–1.0)
NEUTROS PCT: 48 %
Neutro Abs: 2.2 10*3/uL (ref 1.7–7.7)
Platelets: 197 10*3/uL (ref 150–400)
RBC: 2.49 MIL/uL — ABNORMAL LOW (ref 3.87–5.11)
RDW: 12.3 % (ref 11.5–15.5)
WBC: 4.6 10*3/uL (ref 4.0–10.5)

## 2017-10-30 LAB — BASIC METABOLIC PANEL
Anion gap: 7 (ref 5–15)
BUN: 60 mg/dL — AB (ref 8–23)
CALCIUM: 9 mg/dL (ref 8.9–10.3)
CO2: 27 mmol/L (ref 22–32)
CREATININE: 2.27 mg/dL — AB (ref 0.44–1.00)
Chloride: 112 mmol/L — ABNORMAL HIGH (ref 98–111)
GFR calc Af Amer: 20 mL/min — ABNORMAL LOW (ref >60)
GFR calc non Af Amer: 17 mL/min — ABNORMAL LOW (ref >60)
GLUCOSE: 106 mg/dL — AB (ref 70–99)
Potassium: 4.6 mmol/L (ref 3.5–5.1)
Sodium: 146 mmol/L — ABNORMAL HIGH (ref 135–145)

## 2017-10-30 MED ORDER — CEPHALEXIN 500 MG PO CAPS: 500.0000 mg | ORAL_CAPSULE | Freq: Two times a day (BID) | ORAL | 0 refills | Status: AC

## 2017-10-30 MED ORDER — DOXYCYCLINE HYCLATE 100 MG PO TABS: 100.0000 mg | ORAL_TABLET | Freq: Two times a day (BID) | ORAL | 0 refills | Status: AC

## 2017-10-30 MED ORDER — FUROSEMIDE 20 MG PO TABS: 20.0000 mg | ORAL_TABLET | Freq: Every evening | ORAL | 1 refills | Status: DC

## 2017-10-30 MED ORDER — HYDROCORTISONE 1 % EX OINT: 1.0000 "application " | TOPICAL_OINTMENT | Freq: Two times a day (BID) | CUTANEOUS | 0 refills | Status: DC

## 2017-10-30 MED ORDER — FUROSEMIDE 40 MG PO TABS: 40.0000 mg | ORAL_TABLET | Freq: Every day | ORAL | 1 refills | Status: DC

## 2017-10-30 NOTE — Evaluation (Signed)
Occupational Therapy Evaluation Patient Details Name: Carrie Grimes MRN: [id number] DOB: 1921/04/08 Today's Date: 10/30/2017    History of Present Illness Pt is a 84 y.o. F with significant PMH of chronic kidney disease stage IV, anemia, history of CVA, dementia, chronic venous stasis dermatitis, now presenting for evaluation of increase in her chronic lower extremity swelling/redness.   Clinical Impression   Patient presenting with decreased I in self care, balance, functional mobility/transfers, safety awareness, strength, and endurance. Patient currently requires min - max A for self care and min - mod A for functional transfers. Pt required assistance for self care PTA.  Patient will benefit from acute OT to increase overall independence in the areas of ADLs, functional mobility,and balance in order to safely discharge to next venue of care.    Follow Up Recommendations  Home health OT;Supervision/Assistance - 24 hour;Other (comment)([facility])    Equipment Recommendations       Recommendations for Other Services Other (comment)(none recommended)     Precautions / Restrictions Precautions Precautions: Fall Restrictions Weight Bearing Restrictions: No      Mobility Bed Mobility Overal bed mobility: Needs Assistance Bed Mobility: Supine to Sit     Supine to sit: Min assist     General bed mobility comments: seated in recliner chair  Transfers Overall transfer level: Needs assistance Equipment used: Rolling walker (2 wheeled);None Transfers: Sit to/from [employer] Sit to Stand: Mod assist   Squat pivot transfers: Mod assist     General transfer comment: min assist to boost up to standing position. cues provided for hand placement. min assist provided for low pivot without a device from chair to [facility]; hand over hand guidance for correct placement and sequencing    Balance Overall balance assessment: Needs assistance Sitting-balance support:  Bilateral upper extremity supported;Feet supported Sitting balance-Leahy Scale: Good     Standing balance support: Bilateral upper extremity supported Standing balance-Leahy Scale: Poor Standing balance comment: reliant on external support             ADL either performed or assessed with clinical judgement   ADL Overall ADL's : Needs assistance/impaired         Upper Body Bathing: Minimal assistance;Sitting   Lower Body Bathing: Moderate assistance;Sit to/from stand   Upper Body Dressing : Minimal assistance;Sitting   Lower Body Dressing: Moderate assistance;Maximal assistance;Sit to/from stand   Toilet Transfer: Moderate assistance;RW;BSC   Toileting- Clothing Manipulation and Hygiene: Total assistance;Sit to/from stand                            Pertinent Vitals/Pain Pain Assessment: Faces Faces Pain Scale: Hurts a little bit Pain Location: BLE Pain Descriptors / Indicators: Discomfort Pain Intervention(s): Monitored during session     Hand Dominance Right   Extremity/Trunk Assessment Upper Extremity Assessment Upper Extremity Assessment: Generalized weakness   Lower Extremity Assessment Lower Extremity Assessment: Defer to PT evaluation RLE Deficits / Details: Ace wrapped but noted increased swelling/erythema LLE Deficits / Details: Ace wrapped but noted increased swelling/erythema   Cervical / Trunk Assessment Cervical / Trunk Assessment: Kyphotic   Communication Communication Communication: HOH   Cognition Arousal/Alertness: Awake/alert Behavior During Therapy: WFL for tasks assessed/performed Overall Cognitive Status: History of cognitive impairments - at baseline     General Comments: hx of dementia at baseline. Oriented to self.              Home Living Family/patient expects to be discharged to:: Assisted  living      Home Equipment: Gilford Rile - 4 wheels   Additional Comments: From [facility]. Has daughter and grandson in area        Prior Functioning/Environment Level of Independence: Needs assistance  Gait / Transfers Assistance Needed: ambulates with Rollator ADL's / Homemaking Assistance Needed: staff assists with ADL's            OT Problem List: Decreased strength;Impaired balance (sitting and/or standing);Decreased cognition;Pain;Decreased safety awareness;Decreased activity tolerance;Decreased knowledge of use of DME or AE      OT Treatment/Interventions: Self-care/ADL training;Manual therapy;Therapeutic exercise;Modalities;Patient/family education;Balance training;Energy conservation;Therapeutic activities;DME and/or AE instruction    OT Goals(Current goals can be found in the care plan section) Acute Rehab OT Goals Patient Stated Goal: none stated OT Goal Formulation: With patient Time For Goal Achievement: 11/13/17 Potential to Achieve Goals: Fair ADL Goals Pt Will Perform Upper Body Bathing: with supervision Pt Will Perform Lower Body Bathing: with min assist Pt Will Perform Upper Body Dressing: with supervision Pt Will Perform Lower Body Dressing: with min assist Pt Will Transfer to Toilet: with min guard assist Pt Will Perform Toileting - Clothing Manipulation and hygiene: with min guard assist  OT Frequency: Min 2X/week   Barriers to D/C:    n/a          AM-PAC PT "6 Clicks" Daily Activity     Outcome Measure Help from another person eating meals?: A Little Help from another person taking care of personal grooming?: A Little Help from another person toileting, which includes using toliet, bedpan, or urinal?: Total Help from another person bathing (including washing, rinsing, drying)?: A Lot Help from another person to put on and taking off regular upper body clothing?: A Little Help from another person to put on and taking off regular lower body clothing?: A Lot 6 Click Score: 14   End of Session Nurse Communication: Precautions;Mobility status  Activity Tolerance: Patient  limited by fatigue Patient left: in chair;with call bell/phone within reach;with chair alarm set  OT Visit Diagnosis: Muscle weakness (generalized) (M62.81);Repeated falls (R29.6)                Time: 4562-5638 OT Time Calculation (min): 15 min Charges:  OT General Charges $OT Visit: 1 Visit OT Evaluation $OT Eval Low Complexity: 1 Low G-Codes:     Latifa Noble P, MS, OTR/L 10/30/2017, 1:45 PM

## 2017-10-30 NOTE — Care Management Note (Signed)
Case Management Note  Patient Details  Name: Carrie Grimes MRN: [id number] Date of Birth: 1920-11-16  Subjective/Objective:    Admitted with bilateral lower extremity edema, increased chronic redness and swelling..                 Action/Plan: Case manager spoke with Social worker to arrange for patient to return to ALF. CM contacted Carrie Grimes, [facility] Liaison to inform him of patients needs at discharge. Social worker will send orders for Home Health therapies and for wheelchair with patient.    Expected Discharge Date:  10/30/17               Expected Discharge Plan:  Assisted Living / Rest Home([facility])  In-House Referral:  Clinical Social Work  Discharge planning Services  CM Consult  Post Acute Care Choice:  Durable Medical Equipment, Home Health Choice offered to:  Adult Children  DME Arranged:  Programmer, multimedia DME Agency:  (facility to arrange)  HH Arranged:  PT, OT HH Agency:  [facility]  Status of Service:  Completed, signed off  If discussed at [place] of Stay Meetings, dates discussed:    Additional Comments:  Ninfa Meeker, RN 10/30/2017, 12:21 PM

## 2017-10-30 NOTE — Progress Notes (Signed)
2nd attempt to contact Kayla at [facility], [phone]. Message left in general mailbox with contact number for call back. Bartholomew Crews, RN

## 2017-10-30 NOTE — Progress Notes (Signed)
Patient suffers from chronic bilateral lower extremity edema which impairs their ability to perform daily activities like transfer and walk in the home.  A walker alone will not resolve the issues with performing activities of daily living. A wheelchair will allow patient to safely perform daily activities.  The patient can self propel in the home or has a caregiver who can provide assistance.     Ellamae Sia, PT, DPT Acute Rehabilitation Services  Pager: [phone]

## 2017-10-30 NOTE — Discharge Instructions (Signed)
1) take antibiotics as prescribed for lower extremity infection/cellulitis 2)Fall Precautions 3)Physical therapy as advised 4)Repeat CBC and BMP within 1 week 5) keep you to consider EPO/ESA agent due to anemia of chronic kidney disease

## 2017-10-30 NOTE — Progress Notes (Signed)
Attempt to contact Kayla at [phone]. Call goes to general voicemail. Message left with call back number. Bartholomew Crews, RN

## 2017-10-30 NOTE — Evaluation (Addendum)
Physical Therapy Evaluation Patient Details Name: Carrie Grimes MRN: [id number] DOB: 27-Dec-1920 Today's Date: 10/30/2017   History of Present Illness  Pt is a 84 y.o. F with significant PMH of chronic kidney disease stage IV, anemia, history of CVA, dementia, chronic venous stasis dermatitis, now presenting for evaluation of increase in her chronic lower extremity swelling/redness.  Clinical Impression  Pt admitted with above diagnosis. Pt currently with functional limitations due to the deficits listed below (see PT Problem List). Patient seems close to baseline with cognition and mobility. Patient is pleasantly confused and follows simple commands with increased time (extremely HOH). Very motivated to walk. Currently presenting with decreased functional mobility secondary to abnormal posture, balance deficits, generalized weakness, and difficulty walking. Requiring moderate - maximal assistance for safety with transfer from bed to chair using walker. Pt will benefit from skilled PT to increase their independence and safety with mobility to allow discharge to the venue listed below.       Follow Up Recommendations Supervision/Assistance - 24 hour;Home health PT (return to [place])    Financial risk analyst (measurements PT);Wheelchair cushion (measurements PT);Other (comment)(elevating leg rests)    Recommendations for Other Services       Precautions / Restrictions Precautions Precautions: Fall Restrictions Weight Bearing Restrictions: No      Mobility  Bed Mobility Overal bed mobility: Needs Assistance Bed Mobility: Supine to Sit     Supine to sit: Min assist     General bed mobility comments: increased time and effort. min assist provided with use of bed pad to scoot hips forward due to patient's inefficient reciprocal scooting  Transfers Overall transfer level: Needs assistance Equipment used: Rolling walker (2 wheeled);None Transfers: Sit  to/from [employer] Sit to Stand: Min assist   Squat pivot transfers: Min assist     General transfer comment: min assist to boost up to standing position. cues provided for hand placement. min assist provided for low pivot without a device from chair to [facility]; hand over hand guidance for correct placement and sequencing  Ambulation/Gait Ambulation/Gait assistance: Mod assist;Max assist Gait Distance (Feet): 3 Feet Assistive device: Rolling walker (2 wheeled) Gait Pattern/deviations: Step-to pattern;Decreased stride length;Shuffle;Trunk flexed     General Gait Details: Pivotal steps from bed to recliner and then BSC to recliner. patient requiring mostly moderate assistance but up to maximal assistance with turning due to posterior lean and difficulty achieving forward weight shift. physical assistance provided for walker management.   Stairs            Wheelchair Mobility    Modified Rankin (Stroke Patients Only)       Balance Overall balance assessment: Needs assistance Sitting-balance support: Bilateral upper extremity supported;Feet supported Sitting balance-Leahy Scale: Good     Standing balance support: Bilateral upper extremity supported Standing balance-Leahy Scale: Poor Standing balance comment: reliant on external support                             Pertinent Vitals/Pain Pain Assessment: Faces Faces Pain Scale: Hurts a little bit Pain Location: BLE Pain Descriptors / Indicators: Discomfort Pain Intervention(s): Monitored during session    Home Living Family/patient expects to be discharged to:: Assisted living               Home Equipment: Walker - 4 wheels Additional Comments: From [facility]. Has daughter and grandson in area     Prior Function Level of Independence: Needs assistance  Gait / Transfers Assistance Needed: ambulates with Rollator  ADL's / Homemaking Assistance Needed: staff assists with ADL's         Hand Dominance        Extremity/Trunk Assessment   Upper Extremity Assessment Upper Extremity Assessment: Generalized weakness    Lower Extremity Assessment Lower Extremity Assessment: Generalized weakness;RLE deficits/detail;LLE deficits/detail RLE Deficits / Details: Ace wrapped but noted increased swelling/erythema LLE Deficits / Details: Ace wrapped but noted increased swelling/erythema    Cervical / Trunk Assessment Cervical / Trunk Assessment: Kyphotic  Communication   Communication: HOH(hears better out of left ear)  Cognition Arousal/Alertness: Awake/alert Behavior During Therapy: WFL for tasks assessed/performed Overall Cognitive Status: History of cognitive impairments - at baseline                                 General Comments: History of dementia at baseline. Oriented to person, place and situation but not time. Follows simple commands with increased time.       General Comments      Exercises     Assessment/Plan    PT Assessment Patient needs continued PT services  PT Problem List Decreased strength;Decreased activity tolerance;Decreased balance;Decreased mobility;Decreased coordination;Decreased cognition       PT Treatment Interventions DME instruction;Gait training;Functional mobility training;Therapeutic activities;Therapeutic exercise;Balance training;Patient/family education    PT Goals (Current goals can be found in the Care Plan section)  Acute Rehab PT Goals Patient Stated Goal: be able to walk with the walker PT Goal Formulation: With patient Time For Goal Achievement: 11/13/17 Potential to Achieve Goals: Fair    Frequency Min 2X/week   Barriers to discharge        Co-evaluation               AM-PAC PT "6 Clicks" Daily Activity  Outcome Measure Difficulty turning over in bed (including adjusting bedclothes, sheets and blankets)?: A Little Difficulty moving from lying on back to sitting on the side of the  bed? : A Little Difficulty sitting down on and standing up from a chair with arms (e.g., wheelchair, bedside commode, etc,.)?: Unable Help needed moving to and from a bed to chair (including a wheelchair)?: A Lot Help needed walking in hospital room?: A Lot Help needed climbing 3-5 steps with a railing? : Total 6 Click Score: 12    End of Session Equipment Utilized During Treatment: Gait belt Activity Tolerance: Patient tolerated treatment well Patient left: in chair;with call bell/phone within reach;with chair alarm set;with nursing/sitter in room Nurse Communication: Mobility status PT Visit Diagnosis: Unsteadiness on feet (R26.81);Muscle weakness (generalized) (M62.81);Difficulty in walking, not elsewhere classified (R26.2)    Time: 9675-9163 PT Time Calculation (min) (ACUTE ONLY): 39 min   Charges:   PT Evaluation $PT Eval Moderate Complexity: 1 Mod PT Treatments $Therapeutic Activity: 23-37 mins   PT G Codes:        Ellamae Sia, PT, DPT Acute Rehabilitation Services  Pager: [phone]   Willy Eddy 10/30/2017, 12:08 PM

## 2017-10-30 NOTE — NC FL2 (Signed)
[place] LEVEL OF CARE SCREENING TOOL     IDENTIFICATION  Patient Name: Carrie Grimes Birthdate: Nov 11, 1920 Sex: female Admission Date (Current Location): 10/28/2017  [place] and [place] Number:  Kathleen Argue [id number] [place] and Address:  [facility], [place] [address], [place], [place] [address]      Provider Number: [id number]  Attending Physician Name and Address:  Roxan Hockey, MD  Relative Name and Phone Number:  Sander Radon - daughter; [phone]    Current Level of Care: Hospital Recommended Level of Care: Assisted Living Facility(From [facility]) Prior Approval Number:    Date Approved/Denied:   PASRR Number:    Discharge Plan: Other (Comment)([facility])    Current Diagnoses: Patient Active Problem List   Diagnosis Date Noted  . Asymmetric edema of both lower extremities 10/28/2017  . Abnormal serum thyroid stimulating hormone (TSH) level 08/20/2017  . Hyperglycemia 08/20/2017  . Chronic renal insufficiency, stage IV (severe) ([place]) 10/26/2015  . Hyperkalemia 06/12/2015  . HTN (hypertension), benign 11/02/2013  . Venous stasis dermatitis 11/02/2013  . Cellulitis of both lower extremities 09/29/2013  . Urinary incontinence 09/09/2012  . Dementia with behavioral disturbance 06/26/2011  . Edema 10/23/2010  . DJD (degenerative joint disease) 10/23/2010  . ALLERGIC RHINITIS 10/08/2008  . ANEMIA 08/08/2008  . VITAMIN D DEFICIENCY 07/22/2008  . Presbycusis of both ears 07/22/2008  . CKD (chronic kidney disease), stage IV ([place]) 10/08/2007  . HYPERCHOLESTEROLEMIA 04/12/2007  . Pernicious anemia 04/12/2007  . CEREBROVASCULAR DISEASE 04/12/2007  . Chronic venous insufficiency 04/09/2007  . History of completed stroke 02/06/2007  . HIATAL HERNIA 02/06/2007    Orientation RESPIRATION BLADDER Height & Weight     Self, Situation, Place  Normal External catheter, Incontinent(Cath placed 7/2) Weight:  118 lb 9.7 oz (53.8 kg) Height:  5\' 7"  (170.2 cm)  BEHAVIORAL SYMPTOMS/MOOD NEUROLOGICAL BOWEL NUTRITION STATUS      Continent Diet - Regular  AMBULATORY STATUS COMMUNICATION OF NEEDS Skin   Extensive Assist(Mod/Max assist per PT) Verbally Other (Comment)(Cellulitis right/left leg and Ecchymosis right/left arm)                       Personal Care Assistance Level of Assistance  Bathing, Feeding, Dressing Bathing Assistance: Maximum assistance Feeding assistance: Independent Dressing Assistance: Maximum assistance     Functional Limitations Info  Sight, Hearing, Speech Sight Info: Impaired Hearing Info: Impaired Speech Info: Adequate    SPECIAL CARE FACTORS FREQUENCY  PT (By licensed PT)     PT Frequency: Evaluated 7/4              Contractures Contractures Info: Not present    Additional Factors Info  Code Status, Allergies Code Status Info: DNR Allergies Info: Procaine HcL           Current Medications (10/30/2017):  This is the current hospital active medication list Current Facility-Administered Medications  Medication Dose Route Frequency Provider Last Rate Last Dose  . 0.9 %  sodium chloride infusion  250 mL Intravenous PRN Opyd, Ilene Qua, MD   Stopped at 10/30/17 1054  . acetaminophen (TYLENOL) tablet 650 mg  650 mg Oral Q6H PRN Opyd, Ilene Qua, MD   650 mg at 10/29/17 1800   Or  . acetaminophen (TYLENOL) suppository 650 mg  650 mg Rectal Q6H PRN Opyd, Ilene Qua, MD      . bisacodyl (DULCOLAX) EC tablet 5 mg  5 mg Oral Daily PRN Opyd, Ilene Qua, MD      .  ceFAZolin (ANCEF) IVPB 1 g/50 mL premix  1 g Intravenous Q12H Reginia Naas, RPH   Stopped at 10/30/17 1054  . clopidogrel (PLAVIX) tablet 75 mg  75 mg Oral Daily Opyd, Ilene Qua, MD   75 mg at 10/30/17 1002  . divalproex (DEPAKOTE SPRINKLE) capsule 125 mg  125 mg Oral QHS Opyd, Ilene Qua, MD   125 mg at 10/29/17 2230  . famotidine (PEPCID) tablet 10 mg  10 mg Oral Daily Opyd, Ilene Qua, MD   10  mg at 10/30/17 1002  . furosemide (LASIX) tablet 20 mg  20 mg Oral QPM Opyd, Ilene Qua, MD   20 mg at 10/29/17 1753  . furosemide (LASIX) tablet 40 mg  40 mg Oral Daily Opyd, Ilene Qua, MD   40 mg at 10/30/17 1002  . heparin injection 5,000 Units  5,000 Units Subcutaneous Q8H Opyd, Ilene Qua, MD   5,000 Units at 10/30/17 [phone]  . hydrALAZINE (APRESOLINE) tablet 25 mg  25 mg Oral TID Vianne Bulls, MD   25 mg at 10/30/17 1002  . HYDROcodone-acetaminophen (NORCO/VICODIN) 5-325 MG per tablet 1-2 tablet  1-2 tablet Oral Q4H PRN Opyd, Ilene Qua, MD      . hydrocortisone cream 0.5 %   Topical BID Opyd, Ilene Qua, MD      . ondansetron (ZOFRAN) tablet 4 mg  4 mg Oral Q6H PRN Opyd, Ilene Qua, MD       Or  . ondansetron (ZOFRAN) injection 4 mg  4 mg Intravenous Q6H PRN Opyd, Ilene Qua, MD      . oxybutynin (DITROPAN-XL) 24 hr tablet 5 mg  5 mg Oral QHS Opyd, Ilene Qua, MD   5 mg at 10/29/17 2230  . patiromer Daryll Drown) packet 2 packet  2 packet Oral Daily Opyd, Ilene Qua, MD   2 packet at 10/30/17 1047  . senna-docusate (Senokot-S) tablet 1 tablet  1 tablet Oral QHS PRN Opyd, Ilene Qua, MD      . sodium chloride flush (NS) 0.9 % injection 3 mL  3 mL Intravenous Q12H Opyd, Ilene Qua, MD   3 mL at 10/30/17 1010  . sodium chloride flush (NS) 0.9 % injection 3 mL  3 mL Intravenous PRN Opyd, Ilene Qua, MD         Discharge Medications: Please see discharge summary for a list of discharge medications.  Relevant Imaging Results:  Relevant Lab Results:   Additional Information DISCHARGE MEDICATIONS:  TAKE these medications   acetaminophen 500 MG tablet Commonly known as:  TYLENOL Take 1,000 mg by mouth 3 (three) times daily.   ammonium lactate 12 % cream Commonly known as:  AMLACTIN Apply topically to lower legs at bedtime every night for dry/thick skin   cephALEXin 500 MG capsule Commonly known as:  KEFLEX Take 1 capsule (500 mg total) by mouth 2 (two) times daily for 7 days.    cetirizine 10 MG tablet Commonly known as:  ZYRTEC Take 10 mg by mouth daily.   clopidogrel 75 MG tablet Commonly known as:  PLAVIX Take 1 tablet (75 mg total) by mouth daily. RESTART THIS IN 3 DAYS   cyanocobalamin 1000 MCG/ML injection Commonly known as:  (VITAMIN B-12) Inject 1,000 mcg into the skin every 30 (thirty) days. On the 12th of every month.   divalproex 125 MG capsule Commonly known as:  DEPAKOTE SPRINKLE 1 tab po qhs What changed:    how much to take  how to take this  when to take  this  additional instructions   doxycycline 100 MG tablet Commonly known as:  VIBRA-TABS Take 1 tablet (100 mg total) by mouth 2 (two) times daily for 7 days.   fluticasone 0.05 % cream Commonly known as:  CUTIVATE Apply to affected pinkish rash of both lower extremities as needed   furosemide 40 MG tablet Commonly known as:  LASIX Take 1 tablet (40 mg total) by mouth daily with breakfast. What changed:  when to take this   furosemide 20 MG tablet Commonly known as:  LASIX Take 1 tablet (20 mg total) by mouth every evening. What changed:    how much to take  when to take this  additional instructions   hydrALAZINE 25 MG tablet Commonly known as:  APRESOLINE Take 1 tablet (25 mg total) by mouth 3 (three) times daily.   hydrocortisone 1 % ointment Apply 1 application topically 2 (two) times daily. Apply to BOTH Lower Extremities Twice a day   loperamide 2 MG tablet Commonly known as:  IMODIUM A-D Take 4 mg by mouth daily.   multivitamin capsule Take 1 capsule by mouth daily.   multivitamins with iron Tabs tablet Take 1 tablet by mouth daily.   oxybutynin 5 MG 24 hr tablet Commonly known as:  DITROPAN-XL Take 5 mg by mouth at bedtime.   patiromer 8.4 g packet Commonly known as:  VELTASSA 2 packets po qd.  Do not give within 3 hours of taking any other medications.  May give with OR without food. What changed:    how much to take  how to  take this  when to take this  additional instructions   ranitidine 75 MG tablet Commonly known as:  ZANTAC Take 75 mg by mouth daily.   vitamin C 500 MG tablet Commonly known as:  ASCORBIC ACID Take 500 mg by mouth daily.         Sable Feil, LCSW

## 2017-10-30 NOTE — Discharge Summary (Signed)
Carrie Grimes, is a 84 y.o. female  DOB 03-Sep-1920  MRN [id number].  Admission date:  10/28/2017  Admitting Physician  Vianne Bulls, MD  Discharge Date:  10/30/2017   Primary MD  Tammi Sou, MD  Recommendations for primary care physician for things to follow:   1) take antibiotics as prescribed for lower extremity infection/cellulitis 2)Fall Precautions 3)Physical therapy as advised 4)Repeat CBC and BMP within 1 week 5) keep you to consider EPO/ESA agent due to anemia of chronic kidney disease  Admission Diagnosis  Cellulitis of both lower extremities [L03.115, L03.116]   Discharge Diagnosis  Cellulitis of both lower extremities [L03.115, L03.116]    Principal Problem:   Venous stasis dermatitis Active Problems:   History of completed stroke   Chronic venous insufficiency   CKD (chronic kidney disease), stage IV (HCC)   ANEMIA   Dementia with behavioral disturbance   Cellulitis of both lower extremities   Asymmetric edema of both lower extremities      Past Medical History:  Diagnosis Date  . Allergic rhinitis, cause unspecified   . Anemia of chronic kidney failure, stage 4 (severe) (HCC)    [place] via nephrologist  . Cataracts, bilateral   . Cerebrovascular disease, unspecified    CDopplers 1/13 showed mild mixed plaque w/ 40-59% (low end) bilat ICA stenoses, the prev right CAE & patch angioplasty were patent  . Choledocholithiasis    12/11/14 CT--nonobstructive (25mm x 6 mm)  . Chronic renal insufficiency, stage IV (severe) (HCC)    Dr. Marval Regal. Not a candidate for renal replacement therapy.  Pt does not want this, either.  . Dementia with behavioral disturbance    depakote helpful  . Diaphragmatic hernia without mention of obstruction or gangrene   . Essential hypertension   . Hyperkalemia 06/12/2015   diminished renal excretion: as of 05/2016, kayexolate started,  low K diet.  Kayexalate changed to veltassa 2019, veltassa increased to 2 packets qd---potassium goal 5.7 or less.  . Impaired mobility    uses walker, wheelchair  . Pernicious anemia    + amemia of CRI  . Pure hypercholesterolemia   . RBBB   . Retinal hemorrhage of right eye   . Unspecified cerebral artery occlusion with cerebral infarction    MRI Brain 12/12 showed large remote right hemispheric infarct w/ encephalomalacia w/ prominent small vessel dis superimposed on atrophy... (residual deficits are diminished sensation on left side, left hand grip weakness,   . Unspecified disorder resulting from impaired renal function    hyperkalemia and anemia  . Unspecified hearing loss   . Unspecified venous (peripheral) insufficiency    lasix  . Unspecified vitamin D deficiency     Past Surgical History:  Procedure Laterality Date  . CAROTID ENDARTERECTOMY    . CHOLECYSTECTOMY    . ENDOSCOPIC RETROGRADE CHOLANGIOPANCREATOGRAPHY (ERCP) WITH PROPOFOL N/A 03/09/2015   Procedure: ENDOSCOPIC RETROGRADE CHOLANGIOPANCREATOGRAPHY (ERCP) WITH PROPOFOL;  Surgeon: Milus Banister, MD;  Location: [facility];  Service: Endoscopy;  Laterality: N/A;  . laser  surgery on right eye  12/02/2012   Dr. [facility]aird [facility]       HPI  from the history and physical done on the day of admission:    Patient coming from: ALF   Chief Complaint: Bilateral lower leg with increase in chronic swelling and redness   HPI: LY WASS is a 84 y.o. female with medical history significant for chronic kidney disease stage IV, anemia, history of CVA, dementia with behavioral disturbance, and chronic venous stasis dermatitis, now presenting to the emergency department for evaluation of increase in her chronic lower extremity swelling and redness.  Per report of the nursing home, patient has difficulty being compliant with leg elevation, is normally able to ambulate with a walker, but has had recent  increase in swelling and redness to the bilateral lower extremities with reports of leg pain and decreased activity.  No fevers have been documented.  Patient has not appeared short of breath.  No interventions have been attempted for the symptoms yet.  ED Course: Upon arrival to the ED, patient is found to be afebrile, saturating well on room air, and with vitals otherwise normal.  EKG features a sinus rhythm with RBBB. Chemistry panel is notable for a creatinine of 2.38, slightly up from priors in the 2-2.2 range.  CBC features a stable chronic anemia with hemoglobin of 9.5.  Lactic acid is reassuringly normal x2.  BNP is elevated 428.  Patient was given 40 mg IV Lasix and Ancef in the ED.  She remains hemodynamically stable, in no apparent respiratory distress, and will be observed on the medical-surgical unit for ongoing evaluation and management.        Hospital Course:    1)Chronic Venous stasis Dermatitis with superimposed cellulitis--okay to discharge on doxycycline and Keflex combination, use topical 1% hydrocortisone ointment twice daily, follow-up with PCP for recheck within a week, okay to use Ace wraps/compression.  She has no open wounds no drainage.  No fevers and no leukocytosis.  Elevate lower extremities as much as possible.  Venous Dopplers without evidence of acute DVT  2) CKD stage IV--- renal function appears to be close to patient's baseline with a creatinine of 2.2 , GFR in the 17 range, avoid nephrotoxic agents, recheck BMP with PCP within a week  3)Chronic Anemia of chronic kidney Disease--talk to PCP about potentially trying ESA/EPO agent, no evidence of acute bleeding, H&H appears to be stable, recheck CBC with PCP within a week  4)Dementia with Behavioral Disturbance--stable continue Depakote, patient is Plavix due to prior history of stroke  5)HFpEF--- patient with history of chronic diastolic dysfunction CHF, no acute exacerbation at this time, last known EF 65 to  70%, continue Lasix as ordered  Discharge Condition: stable  Follow UP---- PCP within a week for BMP and CBC recheck discussed possibly starting EPO/ESA agent  Diet and Activity recommendation:  As advised  Discharge Instructions    Discharge Instructions    Call MD for:  difficulty breathing, headache or visual disturbances   Complete by:  As directed    Call MD for:  persistant dizziness or light-headedness   Complete by:  As directed    Call MD for:  persistant nausea and vomiting   Complete by:  As directed    Call MD for:  redness, tenderness, or signs of infection (pain, swelling, redness, odor or green/yellow discharge around incision site)   Complete by:  As directed    Call MD for:  temperature >  100.4   Complete by:  As directed    Diet - low sodium heart healthy   Complete by:  As directed    Discharge instructions   Complete by:  As directed    1) take antibiotics as prescribed for lower extremity infection/cellulitis 2)Fall Precautions 3)Physical therapy as advised 4)Repeat CBC and BMP within 1 week 5) keep you to consider EPO/ESA agent due to anemia of chronic kidney disease   Increase activity slowly   Complete by:  As directed        Discharge Medications     Allergies as of 10/30/2017      Reactions   Procaine Hcl Other (See Comments)   REACTION: pt states reaction to shot at dentist office w/ tachycardia \\T \ SOB (? if really from combination w/epi)      Medication List    TAKE these medications   acetaminophen 500 MG tablet Commonly known as:  TYLENOL Take 1,000 mg by mouth 3 (three) times daily.   ammonium lactate 12 % cream Commonly known as:  AMLACTIN Apply topically to lower legs at bedtime every night for dry/thick skin   cephALEXin 500 MG capsule Commonly known as:  KEFLEX Take 1 capsule (500 mg total) by mouth 2 (two) times daily for 7 days.   cetirizine 10 MG tablet Commonly known as:  ZYRTEC Take 10 mg by mouth daily.     clopidogrel 75 MG tablet Commonly known as:  PLAVIX Take 1 tablet (75 mg total) by mouth daily. RESTART THIS IN 3 DAYS   cyanocobalamin 1000 MCG/ML injection Commonly known as:  (VITAMIN B-12) Inject 1,000 mcg into the skin every 30 (thirty) days. On the 12th of every month.   divalproex 125 MG capsule Commonly known as:  DEPAKOTE SPRINKLE 1 tab po qhs What changed:    how much to take  how to take this  when to take this  additional instructions   doxycycline 100 MG tablet Commonly known as:  VIBRA-TABS Take 1 tablet (100 mg total) by mouth 2 (two) times daily for 7 days.   fluticasone 0.05 % cream Commonly known as:  CUTIVATE Apply to affected pinkish rash of both lower extremities as needed   furosemide 40 MG tablet Commonly known as:  LASIX Take 1 tablet (40 mg total) by mouth daily with breakfast. What changed:  when to take this   furosemide 20 MG tablet Commonly known as:  LASIX Take 1 tablet (20 mg total) by mouth every evening. What changed:    how much to take  when to take this  additional instructions   hydrALAZINE 25 MG tablet Commonly known as:  APRESOLINE Take 1 tablet (25 mg total) by mouth 3 (three) times daily.   hydrocortisone 1 % ointment Apply 1 application topically 2 (two) times daily. Apply to BOTH Lower Extremities Twice a day   loperamide 2 MG tablet Commonly known as:  IMODIUM A-D Take 4 mg by mouth daily.   multivitamin capsule Take 1 capsule by mouth daily.   multivitamins with iron Tabs tablet Take 1 tablet by mouth daily.   oxybutynin 5 MG 24 hr tablet Commonly known as:  DITROPAN-XL Take 5 mg by mouth at bedtime.   patiromer 8.4 g packet Commonly known as:  VELTASSA 2 packets po qd.  Do not give within 3 hours of taking any other medications.  May give with OR without food. What changed:    how much to take  how to take this  when to take this  additional instructions   ranitidine 75 MG tablet Commonly  known as:  ZANTAC Take 75 mg by mouth daily.   vitamin C 500 MG tablet Commonly known as:  ASCORBIC ACID Take 500 mg by mouth daily.       Major procedures and Radiology Reports - PLEASE review detailed and final reports for all details, in brief -    No results found.  Micro Results   Recent Results (from the past 240 hour(s))  MRSA PCR Screening     Status: None   Collection Time: 10/28/17 10:25 PM  Result Value Ref Range Status   MRSA by PCR NEGATIVE NEGATIVE Final    Comment:        The GeneXpert MRSA Assay (FDA approved for NASAL specimens only), is one component of a comprehensive MRSA colonization surveillance program. It is not intended to diagnose MRSA infection nor to guide or monitor treatment for MRSA infections. Performed at [facility], [place] [address], [place], [place] [address]     Today   Subjective    Ja Pistole today has no new complaints, No fever  Or chills , no nausea, vomiting, diarrhea           Patient has been seen and examined prior to discharge   Objective   Blood pressure (!) 157/53, pulse 64, temperature 98 F (36.7 C), resp. rate 18, height 5\' 7"  (1.702 m), weight 53.8 kg (118 lb 9.7 oz), SpO2 99 %.   Intake/Output Summary (Last 24 hours) at 10/30/2017 1124 Last data filed at 10/30/2017 1054 Gross per 24 hour  Intake 753 ml  Output 2100 ml  Net -1347 ml    Exam Gen:- Awake Alert,  In no apparent distress  HEENT:- [place].AT, No sclera icterus Ears-hard of hearing Neck-Supple Neck,No JVD,.  Lungs-  CTAB , good air movement CV- S1, S2 normal Abd-  +ve B.Sounds, Abd Soft, No tenderness,    Extremity/Skin:-Improving lower extremity edema, lower extremity with chronic venous stasis type changes, erythema/cellulitis especially of the left lower extremity appears to be improving, no open wounds no drainage Psych-affect is appropriate, oriented x3 Neuro-no new focal deficits, no tremors   Data Review   CBC w Diff:    Lab Results  Component Value Date   WBC 4.6 10/30/2017   HGB 8.1 (L) 10/30/2017   HCT 26.7 (L) 10/30/2017   PLT 197 10/30/2017   LYMPHOPCT 34 10/30/2017   MONOPCT 14 10/30/2017   EOSPCT 3 10/30/2017   BASOPCT 1 10/30/2017    CMP:  Lab Results  Component Value Date   NA 146 (H) 10/30/2017   NA 141 09/09/2017   K 4.6 10/30/2017   CL 112 (H) 10/30/2017   CO2 27 10/30/2017   BUN 60 (H) 10/30/2017   BUN 66 (A) 09/09/2017   CREATININE 2.27 (H) 10/30/2017   CREATININE 2.16 (H) 06/08/2015   GLU 105 09/09/2017   PROT 5.8 (L) 08/20/2017   ALBUMIN 3.1 (L) 10/07/2017   BILITOT 0.5 08/20/2017   ALKPHOS 81 08/20/2017   AST 18 08/20/2017   ALT 5 (L) 08/20/2017  .   Total Discharge time is about 33 minutes  Roxan Hockey M.D on 10/30/2017 at 11:24 AM  [facility]   Office  [phone]  Voice Recognition Viviann Spare dictation system was used to create this note, attempts have been made to correct errors. Please contact the author with questions and/or clarifications.

## 2017-10-30 NOTE — Clinical Social Work Note (Signed)
Clinical Social Work Assessment  Patient Details  Name: Carrie Grimes MRN: [id number] Date of Birth: 1920/06/03  Date of referral:  10/30/17               Reason for consult:  Discharge Planning                Permission sought to share information with:  Family Supports Permission granted to share information::  No(Patient was asleep and did not awaken when called)  Name::     Carrie Grimes  Agency::     Relationship::  Daughter  Contact Information:  [phone]  Housing/Transportation Living arrangements for the past 2 months:  Assisted Living Facility([facility]) Source of Information:  Adult Children Patient Interpreter Needed:  None Criminal Activity/Legal Involvement Pertinent to Current Situation/Hospitalization:  No - Comment as needed Significant Relationships:  Adult Children, Other Family Members Lives with:  Facility Resident([facility]) Do you feel safe going back to the place where you live?  Yes Need for family participation in patient care:  Yes (Comment)  Care giving concerns:  Per daughter, her mother has expressed that she loves it at  [facility], and they she has no concerns regarding the care her mom is receiving at the facility.  Social Worker assessment / plan: CSW visited room and attempted to awaken patient without success. Call made to patient's daughter Carrie Grimes and informed her of discharge today. Daughter advised CSW that patient from [facility] and has been there 4/5 years and receives Medicare and Medicaid. [name] explained that her mom lived with her for 18 years and she began to fall and she could no longer take care of her at home. They talked about assisted living and looked for 2 years per daughter, and when they visited [place], her mom voiced that she liked the facility and "this" was the one.   Employment status:  Retired Nurse, adult, Medicaid In [place] PT Recommendations:  Home  with [place] / Referral to community resources:  Other (Comment Required)(None needed or requested as patient from ALF)  Patient/Family's Response to care:  No concerns expressed by daughter regarding patient's care during hospitalization.  Patient/Family's Understanding of and Emotional Response to Diagnosis, Current Treatment, and Prognosis:  Daughter expressed that patient and family very pleased with patient's current living situation and she will return there.   Emotional Assessment Appearance:  Appears stated age Attitude/Demeanor/Rapport:  Unable to Assess(Patient asleep) Affect (typically observed):  Unable to Assess(Patient asleep) Orientation:  Oriented to Self, Oriented to Place, Oriented to Situation Alcohol / Substance use:  Never Used Psych involvement (Current and /or in the community):  No (Comment)  Discharge Needs  Concerns to be addressed:  Discharge Planning Concerns Readmission within the last 30 days:  No Current discharge risk:  None Barriers to Discharge:  No Barriers Identified   Sable Feil, LCSW 10/30/2017, 11:46 AM

## 2017-11-03 ENCOUNTER — Telehealth: Payer: Self-pay | Admitting: Family Medicine

## 2017-11-03 ENCOUNTER — Telehealth: Payer: Self-pay

## 2017-11-03 DIAGNOSIS — N184 Chronic kidney disease, stage 4 (severe): Secondary | ICD-10-CM

## 2017-11-03 DIAGNOSIS — D631 Anemia in chronic kidney disease: Secondary | ICD-10-CM

## 2017-11-03 NOTE — Telephone Encounter (Signed)
Spoke with daughter, Hassan Rowan. Hassan Rowan requested me to call [facility] to schedule hospital f/u appt.   Spoke with patients nurse at [facility], patient with dementia resides in Assisted Living area of facility. There are nurses on staff that provide patients medications, patient completes her own ADLs per nurse.    Facility only schedules appts on Tuesdays and Thursdays, scheduled to see PCP at next available on 11/20/17 @1pm  (out of 14 day parameter to qualify for TCM).   Per discharge note, patient needs repeat labs in one week (11/06/17). Facility does not perform labwork/phlebotomy. Please advise if you would like lab only appt before 7/25.

## 2017-11-03 NOTE — Telephone Encounter (Signed)
Copied from [place] [id number]. Topic: Inquiry >> Nov 03, 2017  3:37 PM Scherrie Gerlach wrote: Reason for CRM: pt was discharged from the hospital,  Caryl Pina with [facility] would like verbal to resume care.

## 2017-11-04 ENCOUNTER — Ambulatory Visit (HOSPITAL_COMMUNITY)
Admission: RE | Admit: 2017-11-04 | Discharge: 2017-11-04 | Disposition: A | Discharge status: Home / Self Care | Payer: Medicare Other | Source: Ambulatory Visit | Attending: Nephrology | Admitting: Nephrology

## 2017-11-04 VITALS — BP 151/48 | HR 60 | Temp 97.7°F | Resp 20

## 2017-11-04 DIAGNOSIS — D631 Anemia in chronic kidney disease: Secondary | ICD-10-CM | POA: Insufficient documentation

## 2017-11-04 DIAGNOSIS — Z79899 Other long term (current) drug therapy: Secondary | ICD-10-CM | POA: Insufficient documentation

## 2017-11-04 DIAGNOSIS — N184 Chronic kidney disease, stage 4 (severe): Secondary | ICD-10-CM | POA: Diagnosis not present

## 2017-11-04 DIAGNOSIS — Z5181 Encounter for therapeutic drug level monitoring: Secondary | ICD-10-CM | POA: Diagnosis not present

## 2017-11-04 LAB — POCT HEMOGLOBIN-HEMACUE: Hemoglobin: 8.8 g/dL — ABNORMAL LOW (ref 12.0–15.0)

## 2017-11-04 MED ORDER — DARBEPOETIN ALFA 100 MCG/0.5ML IJ SOSY
PREFILLED_SYRINGE | INTRAMUSCULAR | Status: AC
  Administered 2017-11-04: 100 ug via SUBCUTANEOUS
  Filled 2017-11-04: qty 0.5

## 2017-11-04 MED ORDER — DARBEPOETIN ALFA 100 MCG/0.5ML IJ SOSY: 100.0000 ug | PREFILLED_SYRINGE | INTRAMUSCULAR | Status: DC

## 2017-11-05 DIAGNOSIS — Z79899 Other long term (current) drug therapy: Secondary | ICD-10-CM | POA: Diagnosis not present

## 2017-11-05 DIAGNOSIS — Z8673 Personal history of transient ischemic attack (TIA), and cerebral infarction without residual deficits: Secondary | ICD-10-CM | POA: Diagnosis not present

## 2017-11-05 DIAGNOSIS — N183 Chronic kidney disease, stage 3 (moderate): Secondary | ICD-10-CM | POA: Diagnosis not present

## 2017-11-05 DIAGNOSIS — Z7902 Long term (current) use of antithrombotics/antiplatelets: Secondary | ICD-10-CM | POA: Diagnosis not present

## 2017-11-05 DIAGNOSIS — I872 Venous insufficiency (chronic) (peripheral): Secondary | ICD-10-CM | POA: Diagnosis not present

## 2017-11-05 DIAGNOSIS — D510 Vitamin B12 deficiency anemia due to intrinsic factor deficiency: Secondary | ICD-10-CM | POA: Diagnosis not present

## 2017-11-05 DIAGNOSIS — I129 Hypertensive chronic kidney disease with stage 1 through stage 4 chronic kidney disease, or unspecified chronic kidney disease: Secondary | ICD-10-CM | POA: Diagnosis not present

## 2017-11-05 DIAGNOSIS — S80921D Unspecified superficial injury of right lower leg, subsequent encounter: Secondary | ICD-10-CM | POA: Diagnosis not present

## 2017-11-05 NOTE — Telephone Encounter (Signed)
Noted. thx 

## 2017-11-05 NOTE — Telephone Encounter (Signed)
Verbal order given to [place] at [place].

## 2017-11-05 NOTE — Telephone Encounter (Signed)
OK to give verbal order to resume care if this has not already been done.-thx

## 2017-11-05 NOTE — Telephone Encounter (Signed)
Lab visit for CBC w/diff and BMET tomorrow, 11/06/17. Can add her on for 30 min hosp f/u visit on Tues or Thurs of next week--last visit of the day pls.  Thx!

## 2017-11-05 NOTE — Telephone Encounter (Signed)
Spoke with nursing staff at [facility] where pt resides. Patient with dementia but manages her own ADLs. Nursing staff manages medications.   Transition Care Management Follow-up Telephone Call   Admission 10/28/17-10/30/17       Diagnosis: Cellulitis of both lower extremities    Where were you discharged to? [facility]   Items Reviewed:  Medications reviewed: Advised nsg staff to send med list with pt to appt  Allergies reviewed: no  Dietary changes reviewed: no  Referrals reviewed: no   Functional Questionnaire:   Activities of Daily Living (ADLs):   She states they are independent in the following: ambulation, bathing and hygiene, feeding, continence, grooming, toileting and dressing States they require assistance with the following: None   Any transportation issues/concerns?: no, facility provides transportation.    Confirmed importance and date/time of follow-up visits scheduled yes  Provider Appointment booked with PCP Tuesday, 11/11/17 @ 12 noon, lab only appt on 11/06/17.   Confirmed with patient if condition begins to worsen call PCP or go to the ER.  Patient was given the office number and encouraged to call back with question or concerns.  : yes

## 2017-11-05 NOTE — Telephone Encounter (Signed)
Noted: nurse phone contact with patient for TCM. Signed:  Crissie Sickles, MD           11/05/2017

## 2017-11-06 ENCOUNTER — Other Ambulatory Visit (INDEPENDENT_AMBULATORY_CARE_PROVIDER_SITE_OTHER): Payer: Medicare Other

## 2017-11-06 ENCOUNTER — Encounter: Payer: Self-pay | Admitting: *Deleted

## 2017-11-06 ENCOUNTER — Telehealth: Payer: Self-pay | Admitting: *Deleted

## 2017-11-06 DIAGNOSIS — N184 Chronic kidney disease, stage 4 (severe): Secondary | ICD-10-CM

## 2017-11-06 DIAGNOSIS — D631 Anemia in chronic kidney disease: Secondary | ICD-10-CM

## 2017-11-06 LAB — CBC WITH DIFFERENTIAL/PLATELET
BASOS ABS: 0.1 10*3/uL (ref 0.0–0.1)
BASOS PCT: 1.7 % (ref 0.0–3.0)
EOS ABS: 0.1 10*3/uL (ref 0.0–0.7)
Eosinophils Relative: 1.8 % (ref 0.0–5.0)
HCT: 28.1 % — ABNORMAL LOW (ref 36.0–46.0)
Hemoglobin: 9.1 g/dL — ABNORMAL LOW (ref 12.0–15.0)
Lymphocytes Relative: 25.1 % (ref 12.0–46.0)
Lymphs Abs: 1.3 10*3/uL (ref 0.7–4.0)
MCHC: 32.5 g/dL (ref 30.0–36.0)
MCV: 102 fl — AB (ref 78.0–100.0)
MONOS PCT: 8.8 % (ref 3.0–12.0)
Monocytes Absolute: 0.5 10*3/uL (ref 0.1–1.0)
NEUTROS ABS: 3.2 10*3/uL (ref 1.4–7.7)
Neutrophils Relative %: 62.6 % (ref 43.0–77.0)
Platelets: 207 10*3/uL (ref 150.0–400.0)
RBC: 2.76 Mil/uL — ABNORMAL LOW (ref 3.87–5.11)
RDW: 12.7 % (ref 11.5–15.5)
WBC: 5.2 10*3/uL (ref 4.0–10.5)

## 2017-11-06 LAB — BASIC METABOLIC PANEL
BUN: 86 mg/dL (ref 6–23)
CALCIUM: 9.3 mg/dL (ref 8.4–10.5)
CO2: 25 meq/L (ref 19–32)
Chloride: 108 mEq/L (ref 96–112)
Creatinine, Ser: 2.33 mg/dL — ABNORMAL HIGH (ref 0.40–1.20)
GFR: 20.61 mL/min — ABNORMAL LOW (ref >60.00)
Glucose, Bld: 113 mg/dL — ABNORMAL HIGH (ref 70–99)
Potassium: 5.7 mEq/L — ABNORMAL HIGH (ref 3.5–5.1)
Sodium: 141 mEq/L (ref 135–145)

## 2017-11-06 NOTE — Telephone Encounter (Signed)
Saa from [facility] called with:  Critical Lab Results  BUN: 86  Please advise. Thanks.

## 2017-11-06 NOTE — Telephone Encounter (Signed)
This BUN is not much above her baseline, and is c/w her known GFR of 20-22 ml/min.

## 2017-11-07 ENCOUNTER — Encounter: Payer: Self-pay | Admitting: *Deleted

## 2017-11-07 DIAGNOSIS — I129 Hypertensive chronic kidney disease with stage 1 through stage 4 chronic kidney disease, or unspecified chronic kidney disease: Secondary | ICD-10-CM | POA: Diagnosis not present

## 2017-11-07 DIAGNOSIS — Z7902 Long term (current) use of antithrombotics/antiplatelets: Secondary | ICD-10-CM | POA: Diagnosis not present

## 2017-11-07 DIAGNOSIS — I872 Venous insufficiency (chronic) (peripheral): Secondary | ICD-10-CM | POA: Diagnosis not present

## 2017-11-07 DIAGNOSIS — D510 Vitamin B12 deficiency anemia due to intrinsic factor deficiency: Secondary | ICD-10-CM | POA: Diagnosis not present

## 2017-11-07 DIAGNOSIS — S80921D Unspecified superficial injury of right lower leg, subsequent encounter: Secondary | ICD-10-CM | POA: Diagnosis not present

## 2017-11-07 DIAGNOSIS — Z8673 Personal history of transient ischemic attack (TIA), and cerebral infarction without residual deficits: Secondary | ICD-10-CM | POA: Diagnosis not present

## 2017-11-07 DIAGNOSIS — N183 Chronic kidney disease, stage 3 (moderate): Secondary | ICD-10-CM | POA: Diagnosis not present

## 2017-11-07 DIAGNOSIS — Z79899 Other long term (current) drug therapy: Secondary | ICD-10-CM | POA: Diagnosis not present

## 2017-11-10 ENCOUNTER — Telehealth: Payer: Self-pay | Admitting: Family Medicine

## 2017-11-10 DIAGNOSIS — Z8673 Personal history of transient ischemic attack (TIA), and cerebral infarction without residual deficits: Secondary | ICD-10-CM | POA: Diagnosis not present

## 2017-11-10 DIAGNOSIS — S80921D Unspecified superficial injury of right lower leg, subsequent encounter: Secondary | ICD-10-CM | POA: Diagnosis not present

## 2017-11-10 DIAGNOSIS — Z7902 Long term (current) use of antithrombotics/antiplatelets: Secondary | ICD-10-CM | POA: Diagnosis not present

## 2017-11-10 DIAGNOSIS — I129 Hypertensive chronic kidney disease with stage 1 through stage 4 chronic kidney disease, or unspecified chronic kidney disease: Secondary | ICD-10-CM | POA: Diagnosis not present

## 2017-11-10 DIAGNOSIS — Z79899 Other long term (current) drug therapy: Secondary | ICD-10-CM | POA: Diagnosis not present

## 2017-11-10 DIAGNOSIS — I872 Venous insufficiency (chronic) (peripheral): Secondary | ICD-10-CM | POA: Diagnosis not present

## 2017-11-10 DIAGNOSIS — N183 Chronic kidney disease, stage 3 (moderate): Secondary | ICD-10-CM | POA: Diagnosis not present

## 2017-11-10 DIAGNOSIS — D510 Vitamin B12 deficiency anemia due to intrinsic factor deficiency: Secondary | ICD-10-CM | POA: Diagnosis not present

## 2017-11-10 NOTE — Telephone Encounter (Signed)
Carrie Grimes w/ [facility] advised and voiced understanding.

## 2017-11-10 NOTE — Telephone Encounter (Signed)
Requesting VO for PT 2x week for 5 weeks for strengthening, transfer, gaity & balance training, and home safety.  Please advise. Thanks.

## 2017-11-10 NOTE — Telephone Encounter (Signed)
This is fine 

## 2017-11-10 NOTE — Telephone Encounter (Signed)
Copied from [place] [id number]. Topic: General - Other >> Nov 10, 2017 10:49 AM Margot Ables wrote: Reason for CRM: requesting VO for PT 2x week for 5 weeks for strengthening, transfer, gaity & balance training, and home safety

## 2017-11-11 ENCOUNTER — Telehealth: Payer: Self-pay | Admitting: Family Medicine

## 2017-11-11 ENCOUNTER — Ambulatory Visit (INDEPENDENT_AMBULATORY_CARE_PROVIDER_SITE_OTHER): Payer: Medicare Other | Admitting: Family Medicine

## 2017-11-11 ENCOUNTER — Encounter: Payer: Self-pay | Admitting: Family Medicine

## 2017-11-11 ENCOUNTER — Encounter: Payer: Self-pay | Admitting: *Deleted

## 2017-11-11 VITALS — BP 130/62 | HR 58 | Temp 97.6°F | Resp 16 | Ht 67.0 in | Wt 118.0 lb

## 2017-11-11 DIAGNOSIS — N184 Chronic kidney disease, stage 4 (severe): Secondary | ICD-10-CM | POA: Diagnosis not present

## 2017-11-11 DIAGNOSIS — I872 Venous insufficiency (chronic) (peripheral): Secondary | ICD-10-CM

## 2017-11-11 DIAGNOSIS — L03116 Cellulitis of left lower limb: Secondary | ICD-10-CM

## 2017-11-11 DIAGNOSIS — E875 Hyperkalemia: Secondary | ICD-10-CM | POA: Diagnosis not present

## 2017-11-11 DIAGNOSIS — L03115 Cellulitis of right lower limb: Secondary | ICD-10-CM

## 2017-11-11 MED ORDER — PATIROMER SORBITEX CALCIUM 8.4 G PO PACK: PACK | ORAL | 0 refills | Status: DC

## 2017-11-11 MED ORDER — FUROSEMIDE 40 MG PO TABS: 40.0000 mg | ORAL_TABLET | Freq: Two times a day (BID) | ORAL | 1 refills | Status: DC

## 2017-11-11 NOTE — Telephone Encounter (Signed)
Orders for [facility]:  1) Change veltassa to ONE 8.4mg  packet twice per day.  2) Change furosemide to 40mg  TWICE per day. D/c the furosemide 20mg  order.  3) Stop ACE bandage wraps for legs. Restart compression stockings, wear for 10 hours per day.  4) Change cutivate 0.05% cream to "apply bid to lower legs rash every day (not prn).  -thx

## 2017-11-11 NOTE — Progress Notes (Signed)
11/11/2017  CC:  Chief Complaint  Patient presents with  . Hospitalization Follow-up    Patient is a 84 y.o. Caucasian female who presents accompanied by an attendant from [facility] for hospital follow up, specifically transitional care f/u. Dates hospitalized: 7/2-7/4, 2019. Days since d/c from hospital: 12 Patient was discharged from hospital to :  Back to memory unit of [facility]. Reason for admission to hospital: bilat LE cellulitis. Nurse phone contact: 11/03/17.  I have reviewed patient's discharge summary plus pertinent specific notes, labs, and imaging from the hospitalization.   She had signif pain and swelling of her LL's so she went to hosp, was dx'd with bilat LE cellulits.  She has now finished all antibiotics (keflex and doxy). Legs feel much better now and she is getting her strength back.  No pain in legs. She is hungry, eating well.  No fevers.  Medication reconciliation was done today and patient is taking meds as recommended by discharging hospitalist/specialist.  She is taking her valtessa 1 pack qd alternating with 2 packs qd for her hyperkalemia.   PMH:  Past Medical History:  Diagnosis Date  . Allergic rhinitis, cause unspecified   . Anemia of chronic kidney failure, stage 4 (severe) (HCC)    Aranesp via nephrologist  . Cataracts, bilateral   . Cerebrovascular disease, unspecified    CDopplers 1/13 showed mild mixed plaque w/ 40-59% (low end) bilat ICA stenoses, the prev right CAE & patch angioplasty were patent  . Choledocholithiasis    12/11/14 CT--nonobstructive (68mm x 6 mm)  . Chronic renal insufficiency, stage IV (severe) (HCC)    Dr. Marval Regal. Not a candidate for renal replacement therapy.  Pt does not want this, either.  . Dementia with behavioral disturbance    depakote helpful  . Diaphragmatic hernia without mention of obstruction or gangrene   . Essential hypertension   . Hyperkalemia 06/12/2015   diminished renal excretion: as of  05/2016, kayexolate started, low K diet.  Kayexalate changed to veltassa 2019, veltassa increased to 2 packets qd---potassium goal 5.7 or less.  . Impaired mobility    uses walker, wheelchair  . Pernicious anemia    + amemia of CRI  . Pure hypercholesterolemia   . RBBB   . Retinal hemorrhage of right eye   . Unspecified cerebral artery occlusion with cerebral infarction    MRI Brain 12/12 showed large remote right hemispheric infarct w/ encephalomalacia w/ prominent small vessel dis superimposed on atrophy... (residual deficits are diminished sensation on left side, left hand grip weakness,   . Unspecified disorder resulting from impaired renal function    hyperkalemia and anemia  . Unspecified hearing loss   . Unspecified venous (peripheral) insufficiency    lasix  . Unspecified vitamin D deficiency     PSH:  Past Surgical History:  Procedure Laterality Date  . CAROTID ENDARTERECTOMY    . CHOLECYSTECTOMY    . ENDOSCOPIC RETROGRADE CHOLANGIOPANCREATOGRAPHY (ERCP) WITH PROPOFOL N/A 03/09/2015   Procedure: ENDOSCOPIC RETROGRADE CHOLANGIOPANCREATOGRAPHY (ERCP) WITH PROPOFOL;  Surgeon: Milus Banister, MD;  Location: [facility];  Service: Endoscopy;  Laterality: N/A;  . laser surgery on right eye  12/02/2012   Dr. Baird Cancer at [facility]    MEDS:  Outpatient Medications Prior to Visit  Medication Sig Dispense Refill  . acetaminophen (TYLENOL) 500 MG tablet Take 1,000 mg by mouth 3 (three) times daily.     Marland Kitchen ammonium lactate (AMLACTIN) 12 % cream Apply topically to lower legs at bedtime every  night for dry/thick skin 385 g 6  . cetirizine (ZYRTEC) 10 MG tablet Take 10 mg by mouth daily.    . clopidogrel (PLAVIX) 75 MG tablet Take 1 tablet (75 mg total) by mouth daily. RESTART THIS IN 3 DAYS 30 tablet 3  . cyanocobalamin (,VITAMIN B-12,) 1000 MCG/ML injection Inject 1,000 mcg into the skin every 30 (thirty) days. On the 12th of every month.    . divalproex (DEPAKOTE SPRINKLE) 125 MG  capsule 1 tab po qhs (Patient taking differently: Take 125 mg by mouth at bedtime. 1 tab po qhs) 30 capsule 0  . fluticasone (CUTIVATE) 0.05 % cream Apply to affected pinkish rash of both lower extremities as needed 30 g 1  . furosemide (LASIX) 20 MG tablet Take 1 tablet (20 mg total) by mouth every evening. 30 tablet 1  . furosemide (LASIX) 40 MG tablet Take 1 tablet (40 mg total) by mouth daily with breakfast. 30 tablet 1  . hydrALAZINE (APRESOLINE) 25 MG tablet Take 1 tablet (25 mg total) by mouth 3 (three) times daily. 90 tablet 0  . hydrocortisone 1 % ointment Apply 1 application topically 2 (two) times daily. Apply to BOTH Lower Extremities Twice a day 25 g 0  . loperamide (IMODIUM A-D) 2 MG tablet Take 4 mg by mouth daily.     . Multiple Vitamin (MULTIVITAMIN) capsule Take 1 capsule by mouth daily.      . Multiple Vitamins-Iron (MULTIVITAMINS WITH IRON) TABS tablet Take 1 tablet by mouth daily.    Marland Kitchen oxybutynin (DITROPAN-XL) 5 MG 24 hr tablet Take 5 mg by mouth at bedtime.     . patiromer (VELTASSA) 8.4 g packet 2 packets po qd.  Do not give within 3 hours of taking any other medications.  May give with OR without food. (Patient taking differently: Take 8.4-16.8 g by mouth See admin instructions. Give 6.8g on odd days and 16.8g on even days) 60 packet 0  . ranitidine (ZANTAC) 75 MG tablet Take 75 mg by mouth daily.    . vitamin C (ASCORBIC ACID) 500 MG tablet Take 500 mg by mouth daily.      No facility-administered medications prior to visit.    EXAM: BP 130/62 (BP Location: Left Arm, Patient Position: Sitting, Cuff Size: Small)   Pulse (!) 58   Temp 97.6 F (36.4 C) (Oral)   Resp 16   Ht 5\' 7"  (1.702 m)   Wt 118 lb (53.5 kg)   SpO2 98%   BMI 18.48 kg/m  Gen: alert, pleasant, oriented to person and place/situation. QVZ:DGLO: no injection, icteris, swelling, or exudate.  EOMI, PERRLA. Mouth: lips without lesion/swelling.  Oral mucosa pink and moist. Oropharynx without erythema,  exudate, or swelling.  CV: 2/6 systolic murmur, no diastolic murmur.  No r/g. Chest is clear, no wheezing or rales. Normal symmetric air entry throughout both lung fields. No chest wall deformities or tenderness. ABD: soft, NT/ND EXT: 4+ pitting edema in both LL's from below the knee into feet.  Edema same in both LL's, but circumference of L LL chronically significantly larger than R LL.  She has pinkish, fibrotic changes to skin of both pretibial surfaces of LL's. No erythema or tenderness.  Minimal warmth.  No skin breakdown (except small skin tear on lateral aspect of R ankle), no weeping of skin of LL's.     Pertinent labs/imaging Lab Results  Component Value Date   TSH 0.206 (L) 08/20/2017   Lab Results  Component Value Date  WBC 5.2 11/06/2017   HGB 9.1 (L) 11/06/2017   HCT 28.1 (L) 11/06/2017   MCV 102.0 (H) 11/06/2017   PLT 207.0 11/06/2017   Lab Results  Component Value Date   CREATININE 2.33 (H) 11/06/2017   BUN 86 (HH) 11/06/2017   NA 141 11/06/2017   K 5.7 (H) 11/06/2017   CL 108 11/06/2017   CO2 25 11/06/2017   Lab Results  Component Value Date   ALT 5 (L) 08/20/2017   AST 18 08/20/2017   ALKPHOS 81 08/20/2017   BILITOT 0.5 08/20/2017   Lab Results  Component Value Date   CHOL 133 12/02/2012   Lab Results  Component Value Date   HDL 42.60 12/02/2012   Lab Results  Component Value Date   LDLCALC 64 12/02/2012   Lab Results  Component Value Date   TRIG 132.0 12/02/2012   Lab Results  Component Value Date   CHOLHDL 3 12/02/2012   Lab Results  Component Value Date   HGBA1C 5.1 08/21/2017    ASSESSMENT/PLAN:  1) Bilat LL cellulitis in the setting of chronic LE venous stasis dermatitis. Improved, now with only stasis derm changes present.   Plan: encouraged use of the amlactin cream qhs and the cutivate cream bid. Stop wrapping legs in ACE-bandages and restart TED hose. Increase lasix to 40mg  bid with keeping close eye on renal  function. Continue elevation of legs daily.  Low sodium diet would be ideal but not likely being done at ALF.  2) Hyperkalemia: due to diminished excretion of K from stage 4 CRI. Low pot diet would be ideal, but ALF likely not doing this. She sometimes can't take the valtessa 2 packets at one dosing, so I've changed her instructions to 1 packet BID. Also, increase in lasix as noted in #1 above will help decrease K a little.  Goal K is 5.7 or less.  Repeat BMET and CBC were stable on 11/06/17 (potassium at upper limit of her goal, which is 5.7 or less).  Medical decision making of moderate complexity was utilized today.  An After Visit Summary was printed and given to the patient.  FOLLOW UP:  1 week, needs BMET at that time.  Signed:  Crissie Sickles, MD           11/11/2017

## 2017-11-11 NOTE — Telephone Encounter (Signed)
Orders faxed to [facility].

## 2017-11-12 DIAGNOSIS — N183 Chronic kidney disease, stage 3 (moderate): Secondary | ICD-10-CM | POA: Diagnosis not present

## 2017-11-12 DIAGNOSIS — Z7902 Long term (current) use of antithrombotics/antiplatelets: Secondary | ICD-10-CM | POA: Diagnosis not present

## 2017-11-12 DIAGNOSIS — Z79899 Other long term (current) drug therapy: Secondary | ICD-10-CM | POA: Diagnosis not present

## 2017-11-12 DIAGNOSIS — D510 Vitamin B12 deficiency anemia due to intrinsic factor deficiency: Secondary | ICD-10-CM | POA: Diagnosis not present

## 2017-11-12 DIAGNOSIS — Z8673 Personal history of transient ischemic attack (TIA), and cerebral infarction without residual deficits: Secondary | ICD-10-CM | POA: Diagnosis not present

## 2017-11-12 DIAGNOSIS — S80921D Unspecified superficial injury of right lower leg, subsequent encounter: Secondary | ICD-10-CM | POA: Diagnosis not present

## 2017-11-12 DIAGNOSIS — I872 Venous insufficiency (chronic) (peripheral): Secondary | ICD-10-CM | POA: Diagnosis not present

## 2017-11-12 DIAGNOSIS — I129 Hypertensive chronic kidney disease with stage 1 through stage 4 chronic kidney disease, or unspecified chronic kidney disease: Secondary | ICD-10-CM | POA: Diagnosis not present

## 2017-11-13 DIAGNOSIS — S80921A Unspecified superficial injury of right lower leg, initial encounter: Secondary | ICD-10-CM | POA: Diagnosis not present

## 2017-11-14 DIAGNOSIS — Z7902 Long term (current) use of antithrombotics/antiplatelets: Secondary | ICD-10-CM | POA: Diagnosis not present

## 2017-11-14 DIAGNOSIS — Z79899 Other long term (current) drug therapy: Secondary | ICD-10-CM | POA: Diagnosis not present

## 2017-11-14 DIAGNOSIS — I872 Venous insufficiency (chronic) (peripheral): Secondary | ICD-10-CM | POA: Diagnosis not present

## 2017-11-14 DIAGNOSIS — Z8673 Personal history of transient ischemic attack (TIA), and cerebral infarction without residual deficits: Secondary | ICD-10-CM | POA: Diagnosis not present

## 2017-11-14 DIAGNOSIS — D510 Vitamin B12 deficiency anemia due to intrinsic factor deficiency: Secondary | ICD-10-CM | POA: Diagnosis not present

## 2017-11-14 DIAGNOSIS — I129 Hypertensive chronic kidney disease with stage 1 through stage 4 chronic kidney disease, or unspecified chronic kidney disease: Secondary | ICD-10-CM | POA: Diagnosis not present

## 2017-11-14 DIAGNOSIS — S80921D Unspecified superficial injury of right lower leg, subsequent encounter: Secondary | ICD-10-CM | POA: Diagnosis not present

## 2017-11-14 DIAGNOSIS — N183 Chronic kidney disease, stage 3 (moderate): Secondary | ICD-10-CM | POA: Diagnosis not present

## 2017-11-17 DIAGNOSIS — Z8673 Personal history of transient ischemic attack (TIA), and cerebral infarction without residual deficits: Secondary | ICD-10-CM | POA: Diagnosis not present

## 2017-11-17 DIAGNOSIS — S80921D Unspecified superficial injury of right lower leg, subsequent encounter: Secondary | ICD-10-CM | POA: Diagnosis not present

## 2017-11-17 DIAGNOSIS — N183 Chronic kidney disease, stage 3 (moderate): Secondary | ICD-10-CM | POA: Diagnosis not present

## 2017-11-17 DIAGNOSIS — I872 Venous insufficiency (chronic) (peripheral): Secondary | ICD-10-CM | POA: Diagnosis not present

## 2017-11-17 DIAGNOSIS — I129 Hypertensive chronic kidney disease with stage 1 through stage 4 chronic kidney disease, or unspecified chronic kidney disease: Secondary | ICD-10-CM | POA: Diagnosis not present

## 2017-11-17 DIAGNOSIS — D510 Vitamin B12 deficiency anemia due to intrinsic factor deficiency: Secondary | ICD-10-CM | POA: Diagnosis not present

## 2017-11-17 DIAGNOSIS — Z79899 Other long term (current) drug therapy: Secondary | ICD-10-CM | POA: Diagnosis not present

## 2017-11-17 DIAGNOSIS — Z7902 Long term (current) use of antithrombotics/antiplatelets: Secondary | ICD-10-CM | POA: Diagnosis not present

## 2017-11-20 ENCOUNTER — Inpatient Hospital Stay: Payer: Medicare Other | Admitting: Family Medicine

## 2017-11-20 ENCOUNTER — Encounter: Payer: Self-pay | Admitting: Family Medicine

## 2017-11-20 ENCOUNTER — Encounter: Payer: Self-pay | Admitting: *Deleted

## 2017-11-20 ENCOUNTER — Ambulatory Visit (INDEPENDENT_AMBULATORY_CARE_PROVIDER_SITE_OTHER): Payer: Medicare Other | Admitting: Family Medicine

## 2017-11-20 VITALS — BP 130/50 | HR 57 | Temp 97.8°F | Resp 16 | Wt 111.4 lb

## 2017-11-20 DIAGNOSIS — Z79899 Other long term (current) drug therapy: Secondary | ICD-10-CM | POA: Diagnosis not present

## 2017-11-20 DIAGNOSIS — I872 Venous insufficiency (chronic) (peripheral): Secondary | ICD-10-CM

## 2017-11-20 DIAGNOSIS — R7989 Other specified abnormal findings of blood chemistry: Secondary | ICD-10-CM | POA: Diagnosis not present

## 2017-11-20 DIAGNOSIS — N183 Chronic kidney disease, stage 3 (moderate): Secondary | ICD-10-CM | POA: Diagnosis not present

## 2017-11-20 DIAGNOSIS — Z7409 Other reduced mobility: Secondary | ICD-10-CM | POA: Diagnosis not present

## 2017-11-20 DIAGNOSIS — Z8673 Personal history of transient ischemic attack (TIA), and cerebral infarction without residual deficits: Secondary | ICD-10-CM | POA: Diagnosis not present

## 2017-11-20 DIAGNOSIS — R5381 Other malaise: Secondary | ICD-10-CM

## 2017-11-20 DIAGNOSIS — Z7902 Long term (current) use of antithrombotics/antiplatelets: Secondary | ICD-10-CM | POA: Diagnosis not present

## 2017-11-20 DIAGNOSIS — S80921D Unspecified superficial injury of right lower leg, subsequent encounter: Secondary | ICD-10-CM | POA: Diagnosis not present

## 2017-11-20 DIAGNOSIS — E875 Hyperkalemia: Secondary | ICD-10-CM

## 2017-11-20 DIAGNOSIS — N184 Chronic kidney disease, stage 4 (severe): Secondary | ICD-10-CM | POA: Diagnosis not present

## 2017-11-20 DIAGNOSIS — D510 Vitamin B12 deficiency anemia due to intrinsic factor deficiency: Secondary | ICD-10-CM | POA: Diagnosis not present

## 2017-11-20 DIAGNOSIS — I129 Hypertensive chronic kidney disease with stage 1 through stage 4 chronic kidney disease, or unspecified chronic kidney disease: Secondary | ICD-10-CM | POA: Diagnosis not present

## 2017-11-20 LAB — BASIC METABOLIC PANEL
BUN: 83 mg/dL — ABNORMAL HIGH (ref 6–23)
CO2: 23 meq/L (ref 19–32)
Calcium: 9.7 mg/dL (ref 8.4–10.5)
Chloride: 108 mEq/L (ref 96–112)
Creatinine, Ser: 2.62 mg/dL — ABNORMAL HIGH (ref 0.40–1.20)
GFR: 18 mL/min — ABNORMAL LOW (ref >60.00)
GLUCOSE: 103 mg/dL — AB (ref 70–99)
POTASSIUM: 5.1 meq/L (ref 3.5–5.1)
Sodium: 142 mEq/L (ref 135–145)

## 2017-11-20 LAB — TSH: TSH: 0.51 u[IU]/mL (ref 0.35–4.50)

## 2017-11-20 LAB — T4, FREE: Free T4: 0.8 ng/dL (ref 0.60–1.60)

## 2017-11-20 NOTE — Progress Notes (Signed)
OFFICE VISIT  11/20/2017   CC:  Chief Complaint  Patient presents with  . Follow-up    Bilateral LL cellulitis and hyperkalemia    HPI:    Patient is a 84 y.o. Caucasian female who presents for 9 day f/u LE venous insufficiency edema with stasis dermatitis.  Also with CRI stage 4 and chronic hyperkalemia. At last visit I changed her veltassa to 1 packet bid b/c she had trouble taking both packets qd. Also increased her lasix from 40mg  qAM and 20mg  qPM to 40mg  BID. Also, encouraged use of the amlactin cream qhs and the cutivate cream bid for lower legs. Stop wrapping legs in ACE-bandages and restart TED hose.  Interim HX: Has been wearing TED hose since last visit.  Energy is up some--getting Pt--came in using walker today. According to her medicine sheet from [facility] she has been getting meds as I rx'd last visit. LE swelling pretty much the same.  No pain in LL's.  No fevers. No acute complaints.  She has significantly impaired mobility due to arthritis and chronic debilitation from her age and morbidities. She needs nearly full assistance to get from bed to bathroom.  She is able to use a walker but only intermittently-->when energy level is adequate per her judgement or her caregiver's judgement.  ROS: no CP, no SOB, no wheezing, no cough, no dizziness, no HAs, no rashes, no melena/hematochezia.  No polyuria or polydipsia.  No myalgias or arthralgias.   Past Medical History:  Diagnosis Date  . Allergic rhinitis, cause unspecified   . Anemia of chronic kidney failure, stage 4 (severe) (HCC)    Aranesp via nephrologist  . Cataracts, bilateral   . Cerebrovascular disease, unspecified    CDopplers 1/13 showed mild mixed plaque w/ 40-59% (low end) bilat ICA stenoses, the prev right CAE & patch angioplasty were patent  . Choledocholithiasis    12/11/14 CT--nonobstructive (22mm x 6 mm)  . Chronic renal insufficiency, stage IV (severe) (HCC)    Dr. Marval Regal. Not a candidate for  renal replacement therapy.  Pt does not want this, either.  . Dementia with behavioral disturbance    depakote helpful  . Diaphragmatic hernia without mention of obstruction or gangrene   . Essential hypertension   . Hyperkalemia 06/12/2015   diminished renal excretion: as of 05/2016, kayexolate started, low K diet.  Kayexalate changed to veltassa 2019, veltassa increased to 2 packets qd---potassium goal 5.7 or less.  . Impaired mobility    uses walker, wheelchair  . Pernicious anemia    + amemia of CRI  . Pure hypercholesterolemia   . RBBB   . Retinal hemorrhage of right eye   . Unspecified cerebral artery occlusion with cerebral infarction    MRI Brain 12/12 showed large remote right hemispheric infarct w/ encephalomalacia w/ prominent small vessel dis superimposed on atrophy... (residual deficits are diminished sensation on left side, left hand grip weakness,   . Unspecified disorder resulting from impaired renal function    hyperkalemia and anemia  . Unspecified hearing loss   . Unspecified venous (peripheral) insufficiency    lasix  . Unspecified vitamin D deficiency     Past Surgical History:  Procedure Laterality Date  . CAROTID ENDARTERECTOMY    . CHOLECYSTECTOMY    . ENDOSCOPIC RETROGRADE CHOLANGIOPANCREATOGRAPHY (ERCP) WITH PROPOFOL N/A 03/09/2015   Procedure: ENDOSCOPIC RETROGRADE CHOLANGIOPANCREATOGRAPHY (ERCP) WITH PROPOFOL;  Surgeon: Milus Banister, MD;  Location: [facility];  Service: Endoscopy;  Laterality: N/A;  . laser surgery on  right eye  12/02/2012   Dr. Baird Cancer at [facility]    Outpatient Medications Prior to Visit  Medication Sig Dispense Refill  . acetaminophen (TYLENOL) 500 MG tablet Take 1,000 mg by mouth 3 (three) times daily.     Marland Kitchen ammonium lactate (AMLACTIN) 12 % cream Apply topically to lower legs at bedtime every night for dry/thick skin 385 g 6  . cetirizine (ZYRTEC) 10 MG tablet Take 10 mg by mouth daily.    . clopidogrel (PLAVIX) 75 MG  tablet Take 1 tablet (75 mg total) by mouth daily. RESTART THIS IN 3 DAYS 30 tablet 3  . cyanocobalamin (,VITAMIN B-12,) 1000 MCG/ML injection Inject 1,000 mcg into the skin every 30 (thirty) days. On the 12th of every month.    . divalproex (DEPAKOTE SPRINKLE) 125 MG capsule 1 tab po qhs (Patient taking differently: Take 125 mg by mouth at bedtime. 1 tab po qhs) 30 capsule 0  . fluticasone (CUTIVATE) 0.05 % cream Apply to affected pinkish rash of both lower extremities as needed 30 g 1  . furosemide (LASIX) 40 MG tablet Take 1 tablet (40 mg total) by mouth 2 (two) times daily. 60 tablet 1  . hydrALAZINE (APRESOLINE) 25 MG tablet Take 1 tablet (25 mg total) by mouth 3 (three) times daily. 90 tablet 0  . hydrocortisone 1 % ointment Apply 1 application topically 2 (two) times daily. Apply to BOTH Lower Extremities Twice a day 25 g 0  . loperamide (IMODIUM A-D) 2 MG tablet Take 4 mg by mouth daily.     . Multiple Vitamin (MULTIVITAMIN) capsule Take 1 capsule by mouth daily.      . Multiple Vitamins-Iron (MULTIVITAMINS WITH IRON) TABS tablet Take 1 tablet by mouth daily.    Marland Kitchen oxybutynin (DITROPAN-XL) 5 MG 24 hr tablet Take 5 mg by mouth at bedtime.     . patiromer (VELTASSA) 8.4 g packet 1 packet po bid.  Do not give within 3 hours of taking any other medications.  May give with OR without food. 60 packet 0  . ranitidine (ZANTAC) 75 MG tablet Take 75 mg by mouth daily.    . vitamin C (ASCORBIC ACID) 500 MG tablet Take 500 mg by mouth daily.      No facility-administered medications prior to visit.     Allergies  Allergen Reactions  . Procaine Hcl Other (See Comments)    REACTION: pt states reaction to shot at dentist office w/ tachycardia \\T \ SOB (? if really from combination w/epi)    ROS As per HPI  PE: Blood pressure (!) 130/50, pulse (!) 57, temperature 97.8 F (36.6 C), temperature source Oral, resp. rate 16, weight 111 lb 6 oz (50.5 kg), SpO2 99 %. Body mass index is 17.44  kg/m.  Gen: alert, interactive, oriented to person, general place, general situation. CV: RRR, 2/6 syst murmur w/out rub/gallop-->unchanged cardiac exam Chest is clear, no wheezing or rales. Normal symmetric air entry throughout both lung fields. No chest wall deformities or tenderness. EXT: bilat LL edema 4+, w/out weeping or skin breakdown. Mild hyperkeratosis with some very mild superficial desquamation of skin on pretibial regions bilat, with midly pinkish discoloration of these areas as well.  No excessive warmth.  No tenderness.  No erythema. Neuro: strength 4+/5 in prox and dist mm's bilat.  Musc: decreased ROM of shoulders, hips, knees, and ankles.  LABS:    Chemistry      Component Value Date/Time   NA 141 11/06/2017 1131  NA 141 09/09/2017   K 5.7 (H) 11/06/2017 1131   CL 108 11/06/2017 1131   CO2 25 11/06/2017 1131   BUN 86 (HH) 11/06/2017 1131   BUN 66 (A) 09/09/2017   CREATININE 2.33 (H) 11/06/2017 1131   CREATININE 2.16 (H) 06/08/2015 1505   GLU 105 09/09/2017      Component Value Date/Time   CALCIUM 9.3 11/06/2017 1131   ALKPHOS 81 08/20/2017 1347   AST 18 08/20/2017 1347   ALT 5 (L) 08/20/2017 1347   BILITOT 0.5 08/20/2017 1347     Lab Results  Component Value Date   TSH 0.206 (L) 08/20/2017  Free T4 was 0.85 (wnl) on 08/20/17.  IMPRESSION AND PLAN:  1) LE venous insufficiency edema with bilat venous stasis dermatitis. Her cellulitis is completely resolved. She has mild bilat stasis dermatitis that is improved with recent use of cutivate cream bid and amlactin cream qd.. Will continue current topical tx (except d/c hydrocortisone ointment), and continue lasix 40mg  bid. Continue compression hose, best attempt at low Na diet. BMET repeat today, keeping close eye on Cr--noting wt decrease since last visit. May need to back off again on lasix dose. BP and HR good.  2) CRI IV, with chronic hyperkalemia:  See #1 above regarding lasix dosing and BMET  check. Continue veltassa 1 packet bid.  3) Low TSH (mild) and normal T4 during brief hospitalization 07/2017: repeat TSH and T4  today.  4) Chronic debilitation and impaired mobility: Three-in-one raised toilet seat and manual wheelchair ordered today.  An After Visit Summary was printed and given to the patient.  FOLLOW UP: Return for keep appt scheduled for 12/25/17.  Signed:  Crissie Sickles, MD           11/20/2017

## 2017-11-21 ENCOUNTER — Encounter: Payer: Self-pay | Admitting: *Deleted

## 2017-11-24 ENCOUNTER — Encounter: Payer: Self-pay | Admitting: Family Medicine

## 2017-11-24 DIAGNOSIS — I129 Hypertensive chronic kidney disease with stage 1 through stage 4 chronic kidney disease, or unspecified chronic kidney disease: Secondary | ICD-10-CM | POA: Diagnosis not present

## 2017-11-24 DIAGNOSIS — Z79899 Other long term (current) drug therapy: Secondary | ICD-10-CM | POA: Diagnosis not present

## 2017-11-24 DIAGNOSIS — I872 Venous insufficiency (chronic) (peripheral): Secondary | ICD-10-CM | POA: Diagnosis not present

## 2017-11-24 DIAGNOSIS — Z7902 Long term (current) use of antithrombotics/antiplatelets: Secondary | ICD-10-CM | POA: Diagnosis not present

## 2017-11-24 DIAGNOSIS — N183 Chronic kidney disease, stage 3 (moderate): Secondary | ICD-10-CM | POA: Diagnosis not present

## 2017-11-24 DIAGNOSIS — R5381 Other malaise: Secondary | ICD-10-CM | POA: Insufficient documentation

## 2017-11-24 DIAGNOSIS — D510 Vitamin B12 deficiency anemia due to intrinsic factor deficiency: Secondary | ICD-10-CM | POA: Diagnosis not present

## 2017-11-24 DIAGNOSIS — Z8673 Personal history of transient ischemic attack (TIA), and cerebral infarction without residual deficits: Secondary | ICD-10-CM | POA: Diagnosis not present

## 2017-11-24 DIAGNOSIS — S80921D Unspecified superficial injury of right lower leg, subsequent encounter: Secondary | ICD-10-CM | POA: Diagnosis not present

## 2017-11-25 DIAGNOSIS — I872 Venous insufficiency (chronic) (peripheral): Secondary | ICD-10-CM | POA: Diagnosis not present

## 2017-11-25 DIAGNOSIS — S80921A Unspecified superficial injury of right lower leg, initial encounter: Secondary | ICD-10-CM | POA: Diagnosis not present

## 2017-11-25 DIAGNOSIS — Z79899 Other long term (current) drug therapy: Secondary | ICD-10-CM | POA: Diagnosis not present

## 2017-11-25 DIAGNOSIS — I129 Hypertensive chronic kidney disease with stage 1 through stage 4 chronic kidney disease, or unspecified chronic kidney disease: Secondary | ICD-10-CM | POA: Diagnosis not present

## 2017-11-25 DIAGNOSIS — Z8673 Personal history of transient ischemic attack (TIA), and cerebral infarction without residual deficits: Secondary | ICD-10-CM | POA: Diagnosis not present

## 2017-11-25 DIAGNOSIS — N183 Chronic kidney disease, stage 3 (moderate): Secondary | ICD-10-CM | POA: Diagnosis not present

## 2017-11-25 DIAGNOSIS — Z7902 Long term (current) use of antithrombotics/antiplatelets: Secondary | ICD-10-CM | POA: Diagnosis not present

## 2017-11-25 DIAGNOSIS — S80921D Unspecified superficial injury of right lower leg, subsequent encounter: Secondary | ICD-10-CM | POA: Diagnosis not present

## 2017-11-25 DIAGNOSIS — D510 Vitamin B12 deficiency anemia due to intrinsic factor deficiency: Secondary | ICD-10-CM | POA: Diagnosis not present

## 2017-11-26 DIAGNOSIS — R5381 Other malaise: Secondary | ICD-10-CM | POA: Diagnosis not present

## 2017-11-26 DIAGNOSIS — Z7409 Other reduced mobility: Secondary | ICD-10-CM | POA: Diagnosis not present

## 2017-11-27 DIAGNOSIS — Z8673 Personal history of transient ischemic attack (TIA), and cerebral infarction without residual deficits: Secondary | ICD-10-CM | POA: Diagnosis not present

## 2017-11-27 DIAGNOSIS — I872 Venous insufficiency (chronic) (peripheral): Secondary | ICD-10-CM | POA: Diagnosis not present

## 2017-11-27 DIAGNOSIS — Z79899 Other long term (current) drug therapy: Secondary | ICD-10-CM | POA: Diagnosis not present

## 2017-11-27 DIAGNOSIS — S80921D Unspecified superficial injury of right lower leg, subsequent encounter: Secondary | ICD-10-CM | POA: Diagnosis not present

## 2017-11-27 DIAGNOSIS — I129 Hypertensive chronic kidney disease with stage 1 through stage 4 chronic kidney disease, or unspecified chronic kidney disease: Secondary | ICD-10-CM | POA: Diagnosis not present

## 2017-11-27 DIAGNOSIS — N183 Chronic kidney disease, stage 3 (moderate): Secondary | ICD-10-CM | POA: Diagnosis not present

## 2017-11-27 DIAGNOSIS — Z7902 Long term (current) use of antithrombotics/antiplatelets: Secondary | ICD-10-CM | POA: Diagnosis not present

## 2017-11-27 DIAGNOSIS — D510 Vitamin B12 deficiency anemia due to intrinsic factor deficiency: Secondary | ICD-10-CM | POA: Diagnosis not present

## 2017-12-01 DIAGNOSIS — N183 Chronic kidney disease, stage 3 (moderate): Secondary | ICD-10-CM | POA: Diagnosis not present

## 2017-12-01 DIAGNOSIS — S80921D Unspecified superficial injury of right lower leg, subsequent encounter: Secondary | ICD-10-CM | POA: Diagnosis not present

## 2017-12-01 DIAGNOSIS — I872 Venous insufficiency (chronic) (peripheral): Secondary | ICD-10-CM | POA: Diagnosis not present

## 2017-12-01 DIAGNOSIS — D510 Vitamin B12 deficiency anemia due to intrinsic factor deficiency: Secondary | ICD-10-CM | POA: Diagnosis not present

## 2017-12-01 DIAGNOSIS — I129 Hypertensive chronic kidney disease with stage 1 through stage 4 chronic kidney disease, or unspecified chronic kidney disease: Secondary | ICD-10-CM | POA: Diagnosis not present

## 2017-12-01 DIAGNOSIS — Z7902 Long term (current) use of antithrombotics/antiplatelets: Secondary | ICD-10-CM | POA: Diagnosis not present

## 2017-12-01 DIAGNOSIS — Z79899 Other long term (current) drug therapy: Secondary | ICD-10-CM | POA: Diagnosis not present

## 2017-12-01 DIAGNOSIS — Z8673 Personal history of transient ischemic attack (TIA), and cerebral infarction without residual deficits: Secondary | ICD-10-CM | POA: Diagnosis not present

## 2017-12-02 ENCOUNTER — Inpatient Hospital Stay (HOSPITAL_COMMUNITY): Admission: RE | Admit: 2017-12-02 | Payer: Medicare Other | Source: Ambulatory Visit

## 2017-12-03 DIAGNOSIS — Z8673 Personal history of transient ischemic attack (TIA), and cerebral infarction without residual deficits: Secondary | ICD-10-CM | POA: Diagnosis not present

## 2017-12-03 DIAGNOSIS — D510 Vitamin B12 deficiency anemia due to intrinsic factor deficiency: Secondary | ICD-10-CM | POA: Diagnosis not present

## 2017-12-03 DIAGNOSIS — I129 Hypertensive chronic kidney disease with stage 1 through stage 4 chronic kidney disease, or unspecified chronic kidney disease: Secondary | ICD-10-CM | POA: Diagnosis not present

## 2017-12-03 DIAGNOSIS — N183 Chronic kidney disease, stage 3 (moderate): Secondary | ICD-10-CM | POA: Diagnosis not present

## 2017-12-03 DIAGNOSIS — Z7902 Long term (current) use of antithrombotics/antiplatelets: Secondary | ICD-10-CM | POA: Diagnosis not present

## 2017-12-03 DIAGNOSIS — S80921D Unspecified superficial injury of right lower leg, subsequent encounter: Secondary | ICD-10-CM | POA: Diagnosis not present

## 2017-12-03 DIAGNOSIS — Z79899 Other long term (current) drug therapy: Secondary | ICD-10-CM | POA: Diagnosis not present

## 2017-12-03 DIAGNOSIS — I872 Venous insufficiency (chronic) (peripheral): Secondary | ICD-10-CM | POA: Diagnosis not present

## 2017-12-04 DIAGNOSIS — D510 Vitamin B12 deficiency anemia due to intrinsic factor deficiency: Secondary | ICD-10-CM | POA: Diagnosis not present

## 2017-12-04 DIAGNOSIS — Z8673 Personal history of transient ischemic attack (TIA), and cerebral infarction without residual deficits: Secondary | ICD-10-CM | POA: Diagnosis not present

## 2017-12-04 DIAGNOSIS — I872 Venous insufficiency (chronic) (peripheral): Secondary | ICD-10-CM | POA: Diagnosis not present

## 2017-12-04 DIAGNOSIS — Z79899 Other long term (current) drug therapy: Secondary | ICD-10-CM | POA: Diagnosis not present

## 2017-12-04 DIAGNOSIS — N183 Chronic kidney disease, stage 3 (moderate): Secondary | ICD-10-CM | POA: Diagnosis not present

## 2017-12-04 DIAGNOSIS — S80921D Unspecified superficial injury of right lower leg, subsequent encounter: Secondary | ICD-10-CM | POA: Diagnosis not present

## 2017-12-04 DIAGNOSIS — Z7902 Long term (current) use of antithrombotics/antiplatelets: Secondary | ICD-10-CM | POA: Diagnosis not present

## 2017-12-04 DIAGNOSIS — I129 Hypertensive chronic kidney disease with stage 1 through stage 4 chronic kidney disease, or unspecified chronic kidney disease: Secondary | ICD-10-CM | POA: Diagnosis not present

## 2017-12-05 DIAGNOSIS — Z8673 Personal history of transient ischemic attack (TIA), and cerebral infarction without residual deficits: Secondary | ICD-10-CM | POA: Diagnosis not present

## 2017-12-05 DIAGNOSIS — D510 Vitamin B12 deficiency anemia due to intrinsic factor deficiency: Secondary | ICD-10-CM | POA: Diagnosis not present

## 2017-12-05 DIAGNOSIS — I872 Venous insufficiency (chronic) (peripheral): Secondary | ICD-10-CM | POA: Diagnosis not present

## 2017-12-05 DIAGNOSIS — Z7902 Long term (current) use of antithrombotics/antiplatelets: Secondary | ICD-10-CM | POA: Diagnosis not present

## 2017-12-05 DIAGNOSIS — Z79899 Other long term (current) drug therapy: Secondary | ICD-10-CM | POA: Diagnosis not present

## 2017-12-05 DIAGNOSIS — N183 Chronic kidney disease, stage 3 (moderate): Secondary | ICD-10-CM | POA: Diagnosis not present

## 2017-12-05 DIAGNOSIS — S80921D Unspecified superficial injury of right lower leg, subsequent encounter: Secondary | ICD-10-CM | POA: Diagnosis not present

## 2017-12-05 DIAGNOSIS — I129 Hypertensive chronic kidney disease with stage 1 through stage 4 chronic kidney disease, or unspecified chronic kidney disease: Secondary | ICD-10-CM | POA: Diagnosis not present

## 2017-12-08 DIAGNOSIS — Z79899 Other long term (current) drug therapy: Secondary | ICD-10-CM | POA: Diagnosis not present

## 2017-12-08 DIAGNOSIS — S80921D Unspecified superficial injury of right lower leg, subsequent encounter: Secondary | ICD-10-CM | POA: Diagnosis not present

## 2017-12-08 DIAGNOSIS — Z7902 Long term (current) use of antithrombotics/antiplatelets: Secondary | ICD-10-CM | POA: Diagnosis not present

## 2017-12-08 DIAGNOSIS — N183 Chronic kidney disease, stage 3 (moderate): Secondary | ICD-10-CM | POA: Diagnosis not present

## 2017-12-08 DIAGNOSIS — D510 Vitamin B12 deficiency anemia due to intrinsic factor deficiency: Secondary | ICD-10-CM | POA: Diagnosis not present

## 2017-12-08 DIAGNOSIS — I872 Venous insufficiency (chronic) (peripheral): Secondary | ICD-10-CM | POA: Diagnosis not present

## 2017-12-08 DIAGNOSIS — Z8673 Personal history of transient ischemic attack (TIA), and cerebral infarction without residual deficits: Secondary | ICD-10-CM | POA: Diagnosis not present

## 2017-12-08 DIAGNOSIS — I129 Hypertensive chronic kidney disease with stage 1 through stage 4 chronic kidney disease, or unspecified chronic kidney disease: Secondary | ICD-10-CM | POA: Diagnosis not present

## 2017-12-09 ENCOUNTER — Ambulatory Visit (HOSPITAL_COMMUNITY)
Admission: RE | Admit: 2017-12-09 | Discharge: 2017-12-09 | Disposition: A | Discharge status: Home / Self Care | Payer: Medicare Other | Source: Ambulatory Visit | Attending: Nephrology | Admitting: Nephrology

## 2017-12-09 VITALS — BP 152/49 | HR 56 | Temp 98.7°F | Resp 20

## 2017-12-09 DIAGNOSIS — I872 Venous insufficiency (chronic) (peripheral): Secondary | ICD-10-CM | POA: Diagnosis not present

## 2017-12-09 DIAGNOSIS — N183 Chronic kidney disease, stage 3 (moderate): Secondary | ICD-10-CM | POA: Diagnosis not present

## 2017-12-09 DIAGNOSIS — Z79899 Other long term (current) drug therapy: Secondary | ICD-10-CM | POA: Insufficient documentation

## 2017-12-09 DIAGNOSIS — Z5181 Encounter for therapeutic drug level monitoring: Secondary | ICD-10-CM | POA: Diagnosis not present

## 2017-12-09 DIAGNOSIS — I129 Hypertensive chronic kidney disease with stage 1 through stage 4 chronic kidney disease, or unspecified chronic kidney disease: Secondary | ICD-10-CM | POA: Diagnosis not present

## 2017-12-09 DIAGNOSIS — D631 Anemia in chronic kidney disease: Secondary | ICD-10-CM | POA: Insufficient documentation

## 2017-12-09 DIAGNOSIS — N184 Chronic kidney disease, stage 4 (severe): Secondary | ICD-10-CM | POA: Diagnosis not present

## 2017-12-09 DIAGNOSIS — Z7902 Long term (current) use of antithrombotics/antiplatelets: Secondary | ICD-10-CM | POA: Diagnosis not present

## 2017-12-09 DIAGNOSIS — S80921D Unspecified superficial injury of right lower leg, subsequent encounter: Secondary | ICD-10-CM | POA: Diagnosis not present

## 2017-12-09 DIAGNOSIS — Z8673 Personal history of transient ischemic attack (TIA), and cerebral infarction without residual deficits: Secondary | ICD-10-CM | POA: Diagnosis not present

## 2017-12-09 DIAGNOSIS — D510 Vitamin B12 deficiency anemia due to intrinsic factor deficiency: Secondary | ICD-10-CM | POA: Diagnosis not present

## 2017-12-09 LAB — RENAL FUNCTION PANEL
Albumin: 3.3 g/dL — ABNORMAL LOW (ref 3.5–5.0)
Anion gap: 5 (ref 5–15)
BUN: 82 mg/dL — ABNORMAL HIGH (ref 8–23)
CO2: 27 mmol/L (ref 22–32)
CREATININE: 2.27 mg/dL — AB (ref 0.44–1.00)
Calcium: 9.4 mg/dL (ref 8.9–10.3)
Chloride: 110 mmol/L (ref 98–111)
GFR calc Af Amer: 20 mL/min — ABNORMAL LOW (ref >60)
GFR calc non Af Amer: 17 mL/min — ABNORMAL LOW (ref >60)
GLUCOSE: 115 mg/dL — AB (ref 70–99)
Phosphorus: 3.7 mg/dL (ref 2.5–4.6)
Potassium: 5.5 mmol/L — ABNORMAL HIGH (ref 3.5–5.1)
SODIUM: 142 mmol/L (ref 135–145)

## 2017-12-09 LAB — POCT HEMOGLOBIN-HEMACUE: HEMOGLOBIN: 9.4 g/dL — AB (ref 12.0–15.0)

## 2017-12-09 LAB — FERRITIN: Ferritin: 112 ng/mL (ref 11–307)

## 2017-12-09 LAB — IRON AND TIBC
IRON: 50 ug/dL (ref 28–170)
Saturation Ratios: 18 % (ref 10.4–31.8)
TIBC: 277 ug/dL (ref 250–450)
UIBC: 227 ug/dL

## 2017-12-09 MED ORDER — DARBEPOETIN ALFA 100 MCG/0.5ML IJ SOSY
100.0000 ug | PREFILLED_SYRINGE | INTRAMUSCULAR | Status: DC
  Administered 2017-12-09: 100 ug via SUBCUTANEOUS

## 2017-12-09 MED ORDER — DARBEPOETIN ALFA 100 MCG/0.5ML IJ SOSY
PREFILLED_SYRINGE | INTRAMUSCULAR | Status: AC
  Administered 2017-12-09: 100 ug via SUBCUTANEOUS
  Filled 2017-12-09: qty 0.5

## 2017-12-10 DIAGNOSIS — S80921D Unspecified superficial injury of right lower leg, subsequent encounter: Secondary | ICD-10-CM | POA: Diagnosis not present

## 2017-12-10 DIAGNOSIS — D510 Vitamin B12 deficiency anemia due to intrinsic factor deficiency: Secondary | ICD-10-CM | POA: Diagnosis not present

## 2017-12-10 DIAGNOSIS — N183 Chronic kidney disease, stage 3 (moderate): Secondary | ICD-10-CM | POA: Diagnosis not present

## 2017-12-10 DIAGNOSIS — Z79899 Other long term (current) drug therapy: Secondary | ICD-10-CM | POA: Diagnosis not present

## 2017-12-10 DIAGNOSIS — I872 Venous insufficiency (chronic) (peripheral): Secondary | ICD-10-CM | POA: Diagnosis not present

## 2017-12-10 DIAGNOSIS — Z7902 Long term (current) use of antithrombotics/antiplatelets: Secondary | ICD-10-CM | POA: Diagnosis not present

## 2017-12-10 DIAGNOSIS — I129 Hypertensive chronic kidney disease with stage 1 through stage 4 chronic kidney disease, or unspecified chronic kidney disease: Secondary | ICD-10-CM | POA: Diagnosis not present

## 2017-12-10 DIAGNOSIS — Z8673 Personal history of transient ischemic attack (TIA), and cerebral infarction without residual deficits: Secondary | ICD-10-CM | POA: Diagnosis not present

## 2017-12-11 DIAGNOSIS — Z79899 Other long term (current) drug therapy: Secondary | ICD-10-CM | POA: Diagnosis not present

## 2017-12-11 DIAGNOSIS — I872 Venous insufficiency (chronic) (peripheral): Secondary | ICD-10-CM | POA: Diagnosis not present

## 2017-12-11 DIAGNOSIS — D510 Vitamin B12 deficiency anemia due to intrinsic factor deficiency: Secondary | ICD-10-CM | POA: Diagnosis not present

## 2017-12-11 DIAGNOSIS — Z8673 Personal history of transient ischemic attack (TIA), and cerebral infarction without residual deficits: Secondary | ICD-10-CM | POA: Diagnosis not present

## 2017-12-11 DIAGNOSIS — N183 Chronic kidney disease, stage 3 (moderate): Secondary | ICD-10-CM | POA: Diagnosis not present

## 2017-12-11 DIAGNOSIS — Z7902 Long term (current) use of antithrombotics/antiplatelets: Secondary | ICD-10-CM | POA: Diagnosis not present

## 2017-12-11 DIAGNOSIS — S80921D Unspecified superficial injury of right lower leg, subsequent encounter: Secondary | ICD-10-CM | POA: Diagnosis not present

## 2017-12-11 DIAGNOSIS — I129 Hypertensive chronic kidney disease with stage 1 through stage 4 chronic kidney disease, or unspecified chronic kidney disease: Secondary | ICD-10-CM | POA: Diagnosis not present

## 2017-12-15 DIAGNOSIS — Z79899 Other long term (current) drug therapy: Secondary | ICD-10-CM | POA: Diagnosis not present

## 2017-12-15 DIAGNOSIS — Z8673 Personal history of transient ischemic attack (TIA), and cerebral infarction without residual deficits: Secondary | ICD-10-CM | POA: Diagnosis not present

## 2017-12-15 DIAGNOSIS — I129 Hypertensive chronic kidney disease with stage 1 through stage 4 chronic kidney disease, or unspecified chronic kidney disease: Secondary | ICD-10-CM | POA: Diagnosis not present

## 2017-12-15 DIAGNOSIS — I872 Venous insufficiency (chronic) (peripheral): Secondary | ICD-10-CM | POA: Diagnosis not present

## 2017-12-15 DIAGNOSIS — S80921D Unspecified superficial injury of right lower leg, subsequent encounter: Secondary | ICD-10-CM | POA: Diagnosis not present

## 2017-12-15 DIAGNOSIS — N183 Chronic kidney disease, stage 3 (moderate): Secondary | ICD-10-CM | POA: Diagnosis not present

## 2017-12-15 DIAGNOSIS — Z7902 Long term (current) use of antithrombotics/antiplatelets: Secondary | ICD-10-CM | POA: Diagnosis not present

## 2017-12-15 DIAGNOSIS — D510 Vitamin B12 deficiency anemia due to intrinsic factor deficiency: Secondary | ICD-10-CM | POA: Diagnosis not present

## 2017-12-17 DIAGNOSIS — S80921D Unspecified superficial injury of right lower leg, subsequent encounter: Secondary | ICD-10-CM | POA: Diagnosis not present

## 2017-12-17 DIAGNOSIS — D510 Vitamin B12 deficiency anemia due to intrinsic factor deficiency: Secondary | ICD-10-CM | POA: Diagnosis not present

## 2017-12-17 DIAGNOSIS — Z79899 Other long term (current) drug therapy: Secondary | ICD-10-CM | POA: Diagnosis not present

## 2017-12-17 DIAGNOSIS — N183 Chronic kidney disease, stage 3 (moderate): Secondary | ICD-10-CM | POA: Diagnosis not present

## 2017-12-17 DIAGNOSIS — I872 Venous insufficiency (chronic) (peripheral): Secondary | ICD-10-CM | POA: Diagnosis not present

## 2017-12-17 DIAGNOSIS — Z8673 Personal history of transient ischemic attack (TIA), and cerebral infarction without residual deficits: Secondary | ICD-10-CM | POA: Diagnosis not present

## 2017-12-17 DIAGNOSIS — I129 Hypertensive chronic kidney disease with stage 1 through stage 4 chronic kidney disease, or unspecified chronic kidney disease: Secondary | ICD-10-CM | POA: Diagnosis not present

## 2017-12-17 DIAGNOSIS — Z7902 Long term (current) use of antithrombotics/antiplatelets: Secondary | ICD-10-CM | POA: Diagnosis not present

## 2017-12-18 DIAGNOSIS — I129 Hypertensive chronic kidney disease with stage 1 through stage 4 chronic kidney disease, or unspecified chronic kidney disease: Secondary | ICD-10-CM | POA: Diagnosis not present

## 2017-12-18 DIAGNOSIS — N183 Chronic kidney disease, stage 3 (moderate): Secondary | ICD-10-CM | POA: Diagnosis not present

## 2017-12-18 DIAGNOSIS — Z8673 Personal history of transient ischemic attack (TIA), and cerebral infarction without residual deficits: Secondary | ICD-10-CM | POA: Diagnosis not present

## 2017-12-18 DIAGNOSIS — Z79899 Other long term (current) drug therapy: Secondary | ICD-10-CM | POA: Diagnosis not present

## 2017-12-18 DIAGNOSIS — S80921D Unspecified superficial injury of right lower leg, subsequent encounter: Secondary | ICD-10-CM | POA: Diagnosis not present

## 2017-12-18 DIAGNOSIS — I872 Venous insufficiency (chronic) (peripheral): Secondary | ICD-10-CM | POA: Diagnosis not present

## 2017-12-18 DIAGNOSIS — D510 Vitamin B12 deficiency anemia due to intrinsic factor deficiency: Secondary | ICD-10-CM | POA: Diagnosis not present

## 2017-12-18 DIAGNOSIS — Z7902 Long term (current) use of antithrombotics/antiplatelets: Secondary | ICD-10-CM | POA: Diagnosis not present

## 2017-12-22 ENCOUNTER — Ambulatory Visit: Payer: Medicare Other | Admitting: Family Medicine

## 2017-12-22 DIAGNOSIS — Z8673 Personal history of transient ischemic attack (TIA), and cerebral infarction without residual deficits: Secondary | ICD-10-CM | POA: Diagnosis not present

## 2017-12-22 DIAGNOSIS — N183 Chronic kidney disease, stage 3 (moderate): Secondary | ICD-10-CM | POA: Diagnosis not present

## 2017-12-22 DIAGNOSIS — S80921D Unspecified superficial injury of right lower leg, subsequent encounter: Secondary | ICD-10-CM | POA: Diagnosis not present

## 2017-12-22 DIAGNOSIS — Z79899 Other long term (current) drug therapy: Secondary | ICD-10-CM | POA: Diagnosis not present

## 2017-12-22 DIAGNOSIS — Z7902 Long term (current) use of antithrombotics/antiplatelets: Secondary | ICD-10-CM | POA: Diagnosis not present

## 2017-12-22 DIAGNOSIS — D510 Vitamin B12 deficiency anemia due to intrinsic factor deficiency: Secondary | ICD-10-CM | POA: Diagnosis not present

## 2017-12-22 DIAGNOSIS — I129 Hypertensive chronic kidney disease with stage 1 through stage 4 chronic kidney disease, or unspecified chronic kidney disease: Secondary | ICD-10-CM | POA: Diagnosis not present

## 2017-12-22 DIAGNOSIS — I872 Venous insufficiency (chronic) (peripheral): Secondary | ICD-10-CM | POA: Diagnosis not present

## 2017-12-23 ENCOUNTER — Ambulatory Visit: Payer: Medicare Other | Admitting: Podiatry

## 2017-12-25 ENCOUNTER — Encounter: Payer: Self-pay | Admitting: Family Medicine

## 2017-12-25 ENCOUNTER — Emergency Department (HOSPITAL_COMMUNITY)
Admission: EM | Admit: 2017-12-25 | Discharge: 2017-12-25 | Disposition: A | Discharge status: Home / Self Care | Payer: Medicare Other | Attending: Emergency Medicine | Admitting: Emergency Medicine

## 2017-12-25 ENCOUNTER — Encounter (HOSPITAL_COMMUNITY): Payer: Self-pay | Admitting: Emergency Medicine

## 2017-12-25 ENCOUNTER — Telehealth: Payer: Self-pay

## 2017-12-25 ENCOUNTER — Ambulatory Visit (INDEPENDENT_AMBULATORY_CARE_PROVIDER_SITE_OTHER): Payer: Medicare Other | Admitting: Family Medicine

## 2017-12-25 VITALS — BP 152/70 | HR 76 | Temp 97.5°F | Resp 16 | Wt 112.0 lb

## 2017-12-25 DIAGNOSIS — Z8673 Personal history of transient ischemic attack (TIA), and cerebral infarction without residual deficits: Secondary | ICD-10-CM | POA: Diagnosis not present

## 2017-12-25 DIAGNOSIS — I129 Hypertensive chronic kidney disease with stage 1 through stage 4 chronic kidney disease, or unspecified chronic kidney disease: Secondary | ICD-10-CM | POA: Diagnosis not present

## 2017-12-25 DIAGNOSIS — D631 Anemia in chronic kidney disease: Secondary | ICD-10-CM

## 2017-12-25 DIAGNOSIS — F0391 Unspecified dementia with behavioral disturbance: Secondary | ICD-10-CM | POA: Insufficient documentation

## 2017-12-25 DIAGNOSIS — N2889 Other specified disorders of kidney and ureter: Secondary | ICD-10-CM

## 2017-12-25 DIAGNOSIS — Z7902 Long term (current) use of antithrombotics/antiplatelets: Secondary | ICD-10-CM | POA: Insufficient documentation

## 2017-12-25 DIAGNOSIS — I2510 Atherosclerotic heart disease of native coronary artery without angina pectoris: Secondary | ICD-10-CM | POA: Diagnosis not present

## 2017-12-25 DIAGNOSIS — N184 Chronic kidney disease, stage 4 (severe): Secondary | ICD-10-CM | POA: Diagnosis not present

## 2017-12-25 DIAGNOSIS — I10 Essential (primary) hypertension: Secondary | ICD-10-CM | POA: Diagnosis not present

## 2017-12-25 DIAGNOSIS — D510 Vitamin B12 deficiency anemia due to intrinsic factor deficiency: Secondary | ICD-10-CM | POA: Diagnosis not present

## 2017-12-25 DIAGNOSIS — I872 Venous insufficiency (chronic) (peripheral): Secondary | ICD-10-CM | POA: Diagnosis not present

## 2017-12-25 DIAGNOSIS — E875 Hyperkalemia: Secondary | ICD-10-CM | POA: Insufficient documentation

## 2017-12-25 DIAGNOSIS — R799 Abnormal finding of blood chemistry, unspecified: Secondary | ICD-10-CM | POA: Diagnosis present

## 2017-12-25 DIAGNOSIS — R404 Transient alteration of awareness: Secondary | ICD-10-CM | POA: Diagnosis not present

## 2017-12-25 DIAGNOSIS — S80921D Unspecified superficial injury of right lower leg, subsequent encounter: Secondary | ICD-10-CM | POA: Diagnosis not present

## 2017-12-25 DIAGNOSIS — R279 Unspecified lack of coordination: Secondary | ICD-10-CM | POA: Diagnosis not present

## 2017-12-25 DIAGNOSIS — Z79899 Other long term (current) drug therapy: Secondary | ICD-10-CM | POA: Diagnosis not present

## 2017-12-25 DIAGNOSIS — Z743 Need for continuous supervision: Secondary | ICD-10-CM | POA: Diagnosis not present

## 2017-12-25 DIAGNOSIS — N183 Chronic kidney disease, stage 3 (moderate): Secondary | ICD-10-CM | POA: Diagnosis not present

## 2017-12-25 DIAGNOSIS — I959 Hypotension, unspecified: Secondary | ICD-10-CM | POA: Diagnosis not present

## 2017-12-25 DIAGNOSIS — R531 Weakness: Secondary | ICD-10-CM | POA: Diagnosis not present

## 2017-12-25 LAB — BASIC METABOLIC PANEL
Anion gap: 8 (ref 5–15)
Anion gap: 9 (ref 5–15)
BUN: 69 mg/dL — ABNORMAL HIGH (ref 6–23)
BUN: 69 mg/dL — ABNORMAL HIGH (ref 8–23)
BUN: 70 mg/dL — AB (ref 8–23)
CALCIUM: 9.5 mg/dL (ref 8.4–10.5)
CALCIUM: 9.2 mg/dL (ref 8.9–10.3)
CO2: 26 mEq/L (ref 19–32)
CO2: 22 mmol/L (ref 22–32)
CO2: 22 mmol/L (ref 22–32)
CREATININE: 2.25 mg/dL — AB (ref 0.44–1.00)
CREATININE: 2.22 mg/dL — AB (ref 0.44–1.00)
Calcium: 9.8 mg/dL (ref 8.9–10.3)
Chloride: 111 mEq/L (ref 96–112)
Chloride: 113 mmol/L — ABNORMAL HIGH (ref 98–111)
Chloride: 113 mmol/L — ABNORMAL HIGH (ref 98–111)
Creatinine, Ser: 2.33 mg/dL — ABNORMAL HIGH (ref 0.40–1.20)
GFR calc Af Amer: 20 mL/min — ABNORMAL LOW (ref >60)
GFR calc non Af Amer: 18 mL/min — ABNORMAL LOW (ref >60)
GFR, EST AFRICAN AMERICAN: 20 mL/min — AB (ref >60)
GFR, EST NON AFRICAN AMERICAN: 17 mL/min — AB (ref >60)
GFR: 20.61 mL/min — AB (ref >60.00)
GLUCOSE: 150 mg/dL — AB (ref 70–99)
Glucose, Bld: 148 mg/dL — ABNORMAL HIGH (ref 70–99)
Glucose, Bld: 108 mg/dL — ABNORMAL HIGH (ref 70–99)
Potassium: 6.2 mmol/L — ABNORMAL HIGH (ref 3.5–5.1)
Potassium: 4.9 mmol/L (ref 3.5–5.1)
SODIUM: 142 meq/L (ref 135–145)
SODIUM: 144 mmol/L (ref 135–145)
Sodium: 143 mmol/L (ref 135–145)

## 2017-12-25 LAB — CBC WITH DIFFERENTIAL/PLATELET
ABS IMMATURE GRANULOCYTES: 0 10*3/uL (ref 0.0–0.1)
BASOS PCT: 1 %
Basophils Absolute: 0.1 10*3/uL (ref 0.0–0.1)
Eosinophils Absolute: 0.1 10*3/uL (ref 0.0–0.7)
Eosinophils Relative: 1 %
HCT: 36 % (ref 36.0–46.0)
Hemoglobin: 10.9 g/dL — ABNORMAL LOW (ref 12.0–15.0)
Immature Granulocytes: 0 %
LYMPHS PCT: 20 %
Lymphs Abs: 1.4 10*3/uL (ref 0.7–4.0)
MCH: 31.8 pg (ref 26.0–34.0)
MCHC: 30.3 g/dL (ref 30.0–36.0)
MCV: 105 fL — ABNORMAL HIGH (ref 78.0–100.0)
Monocytes Absolute: 0.7 10*3/uL (ref 0.1–1.0)
Monocytes Relative: 9 %
Neutro Abs: 4.9 10*3/uL (ref 1.7–7.7)
Neutrophils Relative %: 69 %
PLATELETS: 215 10*3/uL (ref 150–400)
RBC: 3.43 MIL/uL — AB (ref 3.87–5.11)
RDW: 13.8 % (ref 11.5–15.5)
WBC: 7.2 10*3/uL (ref 4.0–10.5)

## 2017-12-25 LAB — CBG MONITORING, ED: Glucose-Capillary: 92 mg/dL (ref 70–99)

## 2017-12-25 MED ORDER — ALBUTEROL SULFATE (2.5 MG/3ML) 0.083% IN NEBU: 10.0000 mg | INHALATION_SOLUTION | Freq: Once | RESPIRATORY_TRACT | Status: DC

## 2017-12-25 MED ORDER — DEXTROSE 10 % IV SOLN
Freq: Once | INTRAVENOUS | Status: AC
  Administered 2017-12-25: 19:00:00 via INTRAVENOUS

## 2017-12-25 MED ORDER — SODIUM BICARBONATE 8.4 % IV SOLN
50.0000 meq | Freq: Once | INTRAVENOUS | Status: AC
  Administered 2017-12-25: 50 meq via INTRAVENOUS
  Filled 2017-12-25: qty 50

## 2017-12-25 MED ORDER — ALBUTEROL (5 MG/ML) CONTINUOUS INHALATION SOLN
10.0000 mg/h | INHALATION_SOLUTION | RESPIRATORY_TRACT | Status: DC
  Administered 2017-12-25: 10 mg/h via RESPIRATORY_TRACT
  Filled 2017-12-25: qty 20

## 2017-12-25 MED ORDER — INSULIN ASPART 100 UNIT/ML IV SOLN
5.0000 [IU] | Freq: Once | INTRAVENOUS | Status: AC
  Administered 2017-12-25: 5 [IU] via INTRAVENOUS
  Filled 2017-12-25: qty 0.05

## 2017-12-25 NOTE — Telephone Encounter (Signed)
Per Dr. Idelle Leech v/o call [facility] and have them take pt to ER due to elevated potassium.   Called [place] and [facility] who will advise pts med tech and call for EMS to take pt to ER.

## 2017-12-25 NOTE — Progress Notes (Signed)
OFFICE VISIT  12/25/2017   CC:  Chief Complaint  Patient presents with  . Follow-up    RCI    HPI:    Patient is a 84 y.o. Caucasian female who presents UNACCOMPANIED TODAY for 1 mo f/u HTN, chronic LE venous insuff edema, and CRI IV with chronic hyperkalemia.  Her med list from her ALF is inaccurate and she has no MAR with her today.  Most recent change in treatment regimen was a decrease in her lasix from 40mg  bid to 40mg  qAM and 20 mg qpm. Most recent aranesp injection was 12/11/17 via nephrologist.  The last office note I have from her nephrologist was from 02/20/17.  She is here alone today. She is still drinking high potassium drinks (orange juice, V8, ? Other fruit juices). She c/o diarrhea and nausea from the veltassa.  Otherwise she has no acute complaint. She asks over and over today if she can get off of the veltassa b/c it gives her diarrhea and makes her nauseated.    Past Medical History:  Diagnosis Date  . Allergic rhinitis, cause unspecified   . Anemia of chronic kidney failure, stage 4 (severe) (HCC)    Aranesp via nephrologist  . Cataracts, bilateral   . Cerebrovascular disease, unspecified    CDopplers 1/13 showed mild mixed plaque w/ 40-59% (low end) bilat ICA stenoses, the prev right CAE & patch angioplasty were patent  . Choledocholithiasis    12/11/14 CT--nonobstructive (76mm x 6 mm)  . Chronic renal insufficiency, stage IV (severe) (HCC)    Dr. Marval Regal. Not a candidate for renal replacement therapy.  Pt does not want this, either.  . Dementia with behavioral disturbance    depakote helpful  . Diaphragmatic hernia without mention of obstruction or gangrene   . Essential hypertension   . Hyperkalemia 06/12/2015   diminished renal excretion: as of 05/2016, kayexolate started, low K diet.  Kayexalate changed to veltassa 2019, veltassa increased to 2 packets qd---potassium goal 5.7 or less.  . Impaired mobility    uses walker, wheelchair  . Pernicious  anemia    + amemia of CRI  . Pure hypercholesterolemia   . RBBB   . Retinal hemorrhage of right eye   . Unspecified cerebral artery occlusion with cerebral infarction    MRI Brain 12/12 showed large remote right hemispheric infarct w/ encephalomalacia w/ prominent small vessel dis superimposed on atrophy... (residual deficits are diminished sensation on left side, left hand grip weakness,   . Unspecified disorder resulting from impaired renal function    hyperkalemia and anemia  . Unspecified hearing loss   . Unspecified venous (peripheral) insufficiency    lasix  . Unspecified vitamin D deficiency     Past Surgical History:  Procedure Laterality Date  . CAROTID ENDARTERECTOMY    . CHOLECYSTECTOMY    . ENDOSCOPIC RETROGRADE CHOLANGIOPANCREATOGRAPHY (ERCP) WITH PROPOFOL N/A 03/09/2015   Procedure: ENDOSCOPIC RETROGRADE CHOLANGIOPANCREATOGRAPHY (ERCP) WITH PROPOFOL;  Surgeon: Milus Banister, MD;  Location: [facility];  Service: Endoscopy;  Laterality: N/A;  . laser surgery on right eye  12/02/2012   Dr. [facility]    Outpatient Medications Prior to Visit  Medication Sig Dispense Refill  . acetaminophen (TYLENOL) 500 MG tablet Take 1,000 mg by mouth 3 (three) times daily.     Marland Kitchen ammonium lactate (AMLACTIN) 12 % cream Apply topically to lower legs at bedtime every night for dry/thick skin 385 g 6  . cetirizine (ZYRTEC) 10 MG tablet Take 10  mg by mouth daily.    . clopidogrel (PLAVIX) 75 MG tablet Take 1 tablet (75 mg total) by mouth daily. RESTART THIS IN 3 DAYS 30 tablet 3  . cyanocobalamin (,VITAMIN B-12,) 1000 MCG/ML injection Inject 1,000 mcg into the skin every 30 (thirty) days. On the 12th of every month.    . divalproex (DEPAKOTE SPRINKLE) 125 MG capsule 1 tab po qhs (Patient taking differently: Take 125 mg by mouth at bedtime. 1 tab po qhs) 30 capsule 0  . fluticasone (CUTIVATE) 0.05 % cream Apply to affected pinkish rash of both lower extremities as needed 30 g  1  . furosemide (LASIX) 20 MG tablet Take 1 tablet by mouth every evening. Along w/ 1 40mg  tab every AM    . furosemide (LASIX) 40 MG tablet Take 1 tablet (40 mg total) by mouth 2 (two) times daily. 60 tablet 1  . hydrALAZINE (APRESOLINE) 25 MG tablet Take 1 tablet (25 mg total) by mouth 3 (three) times daily. 90 tablet 0  . loperamide (IMODIUM A-D) 2 MG tablet Take 4 mg by mouth daily.     . Multiple Vitamins-Iron (MULTIVITAMINS WITH IRON) TABS tablet Take 1 tablet by mouth daily.    Marland Kitchen oxybutynin (DITROPAN-XL) 5 MG 24 hr tablet Take 5 mg by mouth at bedtime.     . patiromer (VELTASSA) 8.4 g packet 1 packet po bid.  Do not give within 3 hours of taking any other medications.  May give with OR without food. 60 packet 0  . ranitidine (ZANTAC) 75 MG tablet Take 75 mg by mouth daily.    . vitamin C (ASCORBIC ACID) 500 MG tablet Take 500 mg by mouth daily.     . hydrocortisone 1 % ointment Apply 1 application topically 2 (two) times daily. Apply to BOTH Lower Extremities Twice a day (Patient not taking: Reported on 12/25/2017) 25 g 0  . Multiple Vitamin (MULTIVITAMIN) capsule Take 1 capsule by mouth daily.       No facility-administered medications prior to visit.     Allergies  Allergen Reactions  . Procaine Hcl Other (See Comments)    REACTION: pt states reaction to shot at dentist office w/ tachycardia \\T \ SOB (? if really from combination w/epi)    ROS As per HPI  PE: Blood pressure (!) 152/70, pulse 76, temperature (!) 97.5 F (36.4 C), temperature source Oral, resp. rate 16, weight 112 lb (50.8 kg), SpO2 96 %. Gen: alert, oriented to person, place, and situation. Pleasant as usual. Oral mucosa pink/moist. CV: RRR, 2/6 syst murmur, no diastolic murmur, no rub/gallop. Chest is clear, no wheezing or rales. Normal symmetric air entry throughout both lung fields. No chest wall deformities or tenderness. EXT: 3+ R LL pitting edema.  4+ R LL pitting edema-->R pretibial region with mild  erythema and warmth c/w pt's usual mild stasis dermatitis.  L calf chronically looks significantly larger in circumference than R calf.  LABS:   Lab Results  Component Value Date   TSH 0.51 11/20/2017      Chemistry      Component Value Date/Time   NA 142 12/25/2017 1058   NA 141 09/09/2017   K 6.5 Repeated and verified X2. (HH) 12/25/2017 1058   CL 111 12/25/2017 1058   CO2 26 12/25/2017 1058   BUN 69 (H) 12/25/2017 1058   BUN 66 (A) 09/09/2017   CREATININE 2.33 (H) 12/25/2017 1058   CREATININE 2.16 (H) 06/08/2015 1505   GLU 105 09/09/2017  Component Value Date/Time   CALCIUM 9.5 12/25/2017 1058   ALKPHOS 81 08/20/2017 1347   AST 18 08/20/2017 1347   ALT 5 (L) 08/20/2017 1347   BILITOT 0.5 08/20/2017 1347     Lab Results  Component Value Date   WBC 5.2 11/06/2017   HGB 9.4 (L) 12/09/2017   HCT 28.1 (L) 11/06/2017   MCV 102.0 (H) 11/06/2017   PLT 207.0 11/06/2017   Lab Results  Component Value Date   IRON 50 12/09/2017   TIBC 277 12/09/2017   FERRITIN 112 12/09/2017   IMPRESSION AND PLAN:  1) HTN: bp acceptable here today. Historically has been well controlled on current med regimen. Check BMET today.  2) Chronic LE venous insufficiency edema: stable. Continue compression stockings, try to avoid excessive sodium, continue lasix 40mg  qAM and 20mg  qPM. Check BMET today.  3) CRI stage IV, with persistent hyperkalemia:  BMET today. Stop high K foods/drinks! No med changes today. If K 5 or less, will decrease veltassa dose.  4) Anemia of CRI: she is stable on aranesp injections via nephrologist.  5) Dementia with behavioral disturbance: doing fine/stable-->"pleasantly demented."  Continue depakote qhs.  An After Visit Summary was printed and given to the patient.  FOLLOW UP: Return in about 6 weeks (around 02/05/2018) for 30 min f/u HTN/edema/CRI/hyperkal.  Signed:  Crissie Sickles, MD           12/25/2017  ADDENDUM 4:40 PM: Pt's potassium result  today was 6.5. We called her ALF and recommended she go straight to the nearest ED.

## 2017-12-25 NOTE — ED Triage Notes (Signed)
Per EMS:  Pt had routine blood work done at her facility [facility]) and EMS called due to low potassium.  RN at facility unable to state what it was.  Pt has no complaints.  Pt is HoH and needs to be spoken to loudly in her left ear to hear you

## 2017-12-25 NOTE — Telephone Encounter (Signed)
Critical lab value received from [facility] at [facility]. Value for Potassium - 6.5. Dr. Anitra Lauth notified.

## 2017-12-25 NOTE — ED Notes (Signed)
Patient left at this time with all belongings, transported back to [place] via [place].

## 2017-12-25 NOTE — ED Notes (Signed)
[place] for transportation for pt w/ no special equipment needed for transport.

## 2017-12-25 NOTE — ED Notes (Addendum)
Upon review, pt's potassium 6.5 in labwork

## 2017-12-25 NOTE — Discharge Instructions (Addendum)
You have been treated for your high potassium level.  Please avoid fruit juice or food that are high in potassium content.  Follow up closely with your doctor for a recheck.

## 2017-12-25 NOTE — ED Provider Notes (Signed)
[facility] EMERGENCY DEPARTMENT Provider Note   CSN: [id number] Arrival date & time: 12/25/17  1752     History   Chief Complaint Chief Complaint  Patient presents with  . Abnormal Lab    HPI Carrie Grimes is a 84 y.o. female.  The history is provided by the patient and medical records. No language interpreter was used.  Abnormal Lab     84 year old female with history of prior CVA, CKD, anemia, dementia, hypertension, recurrent hyperkalemia brought here via EMS from her nursing facility due to abnormal potassium level.  History obtained through prior notes.  Patient had routine blood work done at her facility.  Pt is demented, hx limited.  Patient's PCP, Dr. Genevie Ann, was evaluating patient today for her scheduled follow up.  Per his note, patient has most recent change in her treatment regimen which is a decrease in her Lasix from 40 mg twice daily to 40 mg in the morning and 20 mg at nighttime.  Carrie Grimes is still drinking high potassium drinks such as orange juice, V8 and other fruit juice.  Carrie Grimes has been complaining of diarrhea nausea from taking Veltassa otherwise Carrie Grimes has no other complaint.  Currently patient is without any specific complaint.  No complaint of chest pain or trouble breathing no headache or confusion.   Past Medical History:  Diagnosis Date  . Allergic rhinitis, cause unspecified   . Anemia of chronic kidney failure, stage 4 (severe) (HCC)    Aranesp via nephrologist  . Cataracts, bilateral   . Cerebrovascular disease, unspecified    CDopplers 1/13 showed mild mixed plaque w/ 40-59% (low end) bilat ICA stenoses, the prev right CAE & patch angioplasty were patent  . Choledocholithiasis    12/11/14 CT--nonobstructive (58mm x 6 mm)  . Chronic renal insufficiency, stage IV (severe) (HCC)    Dr. Marval Regal. Not a candidate for renal replacement therapy.  Pt does not want this, either.  . Dementia with behavioral disturbance    depakote  helpful  . Diaphragmatic hernia without mention of obstruction or gangrene   . Essential hypertension   . Hyperkalemia 06/12/2015   diminished renal excretion: as of 05/2016, kayexolate started, low K diet.  Kayexalate changed to veltassa 2019, veltassa increased to 2 packets qd---potassium goal 5.7 or less.  . Impaired mobility    uses walker, wheelchair  . Pernicious anemia    + amemia of CRI  . Pure hypercholesterolemia   . RBBB   . Retinal hemorrhage of right eye   . Unspecified cerebral artery occlusion with cerebral infarction    MRI Brain 12/12 showed large remote right hemispheric infarct w/ encephalomalacia w/ prominent small vessel dis superimposed on atrophy... (residual deficits are diminished sensation on left side, left hand grip weakness,   . Unspecified disorder resulting from impaired renal function    hyperkalemia and anemia  . Unspecified hearing loss   . Unspecified venous (peripheral) insufficiency    lasix  . Unspecified vitamin D deficiency     Patient Active Problem List   Diagnosis Date Noted  . Debilitated patient 11/24/2017  . Asymmetric edema of both lower extremities 10/28/2017  . Abnormal serum thyroid stimulating hormone (TSH) level 08/20/2017  . Hyperglycemia 08/20/2017  . Chronic renal insufficiency, stage IV (severe) ([place]) 10/26/2015  . Hyperkalemia 06/12/2015  . HTN (hypertension), benign 11/02/2013  . Venous stasis dermatitis 11/02/2013  . Cellulitis of both lower extremities 09/29/2013  . Urinary incontinence 09/09/2012  . Dementia with behavioral disturbance 06/26/2011  .  Edema 10/23/2010  . DJD (degenerative joint disease) 10/23/2010  . ALLERGIC RHINITIS 10/08/2008  . ANEMIA 08/08/2008  . VITAMIN D DEFICIENCY 07/22/2008  . Presbycusis of both ears 07/22/2008  . CKD (chronic kidney disease), stage IV ([place]) 10/08/2007  . HYPERCHOLESTEROLEMIA 04/12/2007  . Pernicious anemia 04/12/2007  . CEREBROVASCULAR DISEASE 04/12/2007  . Chronic  venous insufficiency 04/09/2007  . History of completed stroke 02/06/2007  . HIATAL HERNIA 02/06/2007    Past Surgical History:  Procedure Laterality Date  . CAROTID ENDARTERECTOMY    . CHOLECYSTECTOMY    . ENDOSCOPIC RETROGRADE CHOLANGIOPANCREATOGRAPHY (ERCP) WITH PROPOFOL N/A 03/09/2015   Procedure: ENDOSCOPIC RETROGRADE CHOLANGIOPANCREATOGRAPHY (ERCP) WITH PROPOFOL;  Surgeon: Milus Banister, MD;  Location: [facility];  Service: Endoscopy;  Laterality: N/A;  . laser surgery on right eye  12/02/2012   Dr. [facility]aird [facility] at [facility]     OB History   None      Home Medications    Prior to Admission medications   Medication Sig Start Date End Date Taking? Authorizing Provider  acetaminophen (TYLENOL) 500 MG tablet Take 1,000 mg by mouth 3 (three) times daily.     [provider]  ammonium lactate (AMLACTIN) 12 % cream Apply topically to lower legs at bedtime every night for dry/thick skin 06/12/17   McGowen, Adrian Blackwater, MD  cetirizine (ZYRTEC) 10 MG tablet Take 10 mg by mouth daily.    [provider]  clopidogrel (PLAVIX) 75 MG tablet Take 1 tablet (75 mg total) by mouth daily. RESTART THIS IN 3 DAYS 03/09/15   Milus Banister, MD  cyanocobalamin (,VITAMIN B-12,) 1000 MCG/ML injection Inject 1,000 mcg into the skin every 30 (thirty) days. On the 12th of every month.    [provider]  divalproex (DEPAKOTE SPRINKLE) 125 MG capsule 1 tab po qhs Patient taking differently: Take 125 mg by mouth at bedtime. 1 tab po qhs 10/24/16   McGowen, Adrian Blackwater, MD  fluticasone (CUTIVATE) 0.05 % cream Apply to affected pinkish rash of both lower extremities as needed 05/22/17   McGowen, Adrian Blackwater, MD  furosemide (LASIX) 20 MG tablet Take 1 tablet by mouth every evening. Along w/ 1 40mg  tab every AM 12/15/17   [provider]  furosemide (LASIX) 40 MG tablet Take 1 tablet (40 mg total) by mouth 2 (two) times daily. 11/11/17   McGowen, Adrian Blackwater, MD  hydrALAZINE  (APRESOLINE) 25 MG tablet Take 1 tablet (25 mg total) by mouth 3 (three) times daily. 06/25/16   McGowen, Adrian Blackwater, MD  hydrocortisone 1 % ointment Apply 1 application topically 2 (two) times daily. Apply to BOTH Lower Extremities Twice a day Patient not taking: Reported on 12/25/2017 10/30/17   Roxan Hockey, MD  loperamide (IMODIUM A-D) 2 MG tablet Take 4 mg by mouth daily.     [provider]  Multiple Vitamin (MULTIVITAMIN) capsule Take 1 capsule by mouth daily.      [provider]  Multiple Vitamins-Iron (MULTIVITAMINS WITH IRON) TABS tablet Take 1 tablet by mouth daily.    [provider]  oxybutynin (DITROPAN-XL) 5 MG 24 hr tablet Take 5 mg by mouth at bedtime.  05/13/17   [provider]  patiromer Daryll Drown) 8.4 g packet 1 packet po bid.  Do not give within 3 hours of taking any other medications.  May give with OR without food. 11/11/17   McGowen, Adrian Blackwater, MD  ranitidine (ZANTAC) 75 MG tablet Take 75 mg by mouth daily.  [provider]  vitamin C (ASCORBIC ACID) 500 MG tablet Take 500 mg by mouth daily.     [provider]    Family History Family History  Problem Relation Age of Onset  . Heart disease Mother   . Heart disease Father     Social History Social History   Tobacco Use  . Smoking status: Never Smoker  . Smokeless tobacco: Never Used  Substance Use Topics  . Alcohol use: No  . Drug use: No     Allergies   Procaine hcl   Review of Systems Review of Systems  Unable to perform ROS: Dementia     Physical Exam Updated Vital Signs BP (!) 196/66 (BP Location: Right Arm)   Pulse (!) 54   Temp 97.9 F (36.6 C) (Oral)   Resp 14   SpO2 100%   Physical Exam  Constitutional: Carrie Grimes appears well-developed and well-nourished. No distress.  Pleasantly elderly female in no acute discomfort.  HENT:  Head: Atraumatic.  Mouth/Throat: Oropharynx is clear and moist.  Eyes: Conjunctivae are normal.  Neck:  Normal range of motion. Neck supple.  Cardiovascular: Intact distal pulses.  Bradycardia without murmur rubs or gallop  Pulmonary/Chest: Effort normal and breath sounds normal.  Abdominal: Soft. Carrie Grimes exhibits no distension. There is no tenderness.  Musculoskeletal: Carrie Grimes exhibits edema (2+ pitting edema to right lower extremity, 3+ pitting edema to left lower extremity).  Neurological: Carrie Grimes is alert.  Skin: No rash noted.  Psychiatric: Carrie Grimes has a normal mood and affect.  Nursing note and vitals reviewed.    ED Treatments / Results  Labs (all labs ordered are listed, but only abnormal results are displayed) Labs Reviewed  CBC WITH DIFFERENTIAL/PLATELET - Abnormal; Notable for the following components:      Result Value   RBC 3.43 (*)    Hemoglobin 10.9 (*)    MCV 105.0 (*)    All other components within normal limits  BASIC METABOLIC PANEL - Abnormal; Notable for the following components:   Potassium 6.2 (*)    Chloride 113 (*)    Glucose, Bld 108 (*)    BUN 69 (*)    Creatinine, Ser 2.25 (*)    GFR calc non Af Amer 17 (*)    GFR calc Af Amer 20 (*)    All other components within normal limits  BASIC METABOLIC PANEL - Abnormal; Notable for the following components:   Chloride 113 (*)    Glucose, Bld 150 (*)    BUN 70 (*)    Creatinine, Ser 2.22 (*)    GFR calc non Af Amer 18 (*)    GFR calc Af Amer 20 (*)    All other components within normal limits  CBG MONITORING, ED    EKG EKG Interpretation  Date/Time:  Thursday December 25 2017 18:18:17 EDT Ventricular Rate:  54 PR Interval:    QRS Duration: 137 QT Interval:  426 QTC Calculation: 404 R Axis:   -16 Text Interpretation:  Sinus rhythm Prolonged PR interval Right bundle branch block similar to prior 7/19 Confirmed by Aletta Edouard [phone]) on 12/25/2017 7:09:24 PM   Radiology No results found.  Procedures .Critical Care Performed by: Domenic Moras, PA-C Authorized by: Domenic Moras, PA-C   Critical care provider  statement:    Critical care time (minutes):  45   Critical care was time spent personally by me on the following activities:  Discussions with consultants, evaluation of patient's response to treatment, examination of patient, ordering  and performing treatments and interventions, ordering and review of laboratory studies, ordering and review of radiographic studies, pulse oximetry, re-evaluation of patient's condition, obtaining history from patient or surrogate and review of old charts   (including critical care time)  Medications Ordered in ED Medications  albuterol (PROVENTIL,VENTOLIN) solution continuous neb (0 mg/hr Nebulization Stopped 12/25/17 2051)  insulin aspart (novoLOG) injection 5 Units (5 Units Intravenous Given 12/25/17 1914)  dextrose 10 % infusion ( Intravenous New Bag/Given 12/25/17 1914)  sodium bicarbonate injection 50 mEq (50 mEq Intravenous Given 12/25/17 1917)     Initial Impression / Assessment and Plan / ED Course  I have reviewed the triage vital signs and the nursing notes.  Pertinent labs & imaging results that were available during my care of the patient were reviewed by me and considered in my medical decision making (see chart for details).  Clinical Course as of Dec 26 2014  Thu Dec 25, 3673  1971 84 year old very hard of hearing and probable some dementia here sent in from her facility for elevated potassium.  It sounds like Carrie Grimes was getting her routine blood drawn and found to be hyperkalemic.  Patient herself denies any complaints.  Eating repeat blood draws along with an EKG.  Carrie Grimes is been started on some medications to help with her hyperkalemia.   [MB]    Clinical Course User Index [MB] Hayden Rasmussen, MD    BP (!) 128/53   Pulse 74   Temp 97.9 F (36.6 C) (Oral)   Resp 17   SpO2 99%    Final Clinical Impressions(s) / ED Diagnoses   Final diagnoses:  Hyperkalemia    ED Discharge Orders    None     6:24 PM Patient sent here from PCP  office due to elevated potassium of 6.5 on a routine checkup.  Carrie Grimes has no history of hyperkalemia.  Carrie Grimes is currently without any significant complaints or discomfort.  Carrie Grimes was found to be hypertensive with blood pressure of 196/66.  Will initiate treatment for hyperkalemia with  Albuterol/dextrose/sodium bicarb/insulin.   11:12 PM After receiving treatment and after rechecking her potassium level, it is now normalized with a K+ of 4.9.  I discussed the finding with Dr. Melina Copa.  At this time patient is stable for discharge.  Her hypertension has since resolved as well. Critical Care filed for treatment of hyperkalemia.    Domenic Moras, PA-C 12/25/17 2327    Hayden Rasmussen, MD 12/26/17 [phone]

## 2017-12-25 NOTE — Telephone Encounter (Signed)
Agree 

## 2017-12-27 DIAGNOSIS — R5381 Other malaise: Secondary | ICD-10-CM | POA: Diagnosis not present

## 2017-12-27 DIAGNOSIS — Z7409 Other reduced mobility: Secondary | ICD-10-CM | POA: Diagnosis not present

## 2017-12-28 DIAGNOSIS — S82209A Unspecified fracture of shaft of unspecified tibia, initial encounter for closed fracture: Secondary | ICD-10-CM

## 2017-12-28 HISTORY — DX: Unspecified fracture of shaft of unspecified tibia, initial encounter for closed fracture: S82.209A

## 2017-12-30 ENCOUNTER — Encounter (HOSPITAL_COMMUNITY): Payer: Medicare Other

## 2017-12-30 DIAGNOSIS — Z8673 Personal history of transient ischemic attack (TIA), and cerebral infarction without residual deficits: Secondary | ICD-10-CM | POA: Diagnosis not present

## 2017-12-30 DIAGNOSIS — Z4800 Encounter for change or removal of nonsurgical wound dressing: Secondary | ICD-10-CM | POA: Diagnosis not present

## 2017-12-30 DIAGNOSIS — I129 Hypertensive chronic kidney disease with stage 1 through stage 4 chronic kidney disease, or unspecified chronic kidney disease: Secondary | ICD-10-CM | POA: Diagnosis not present

## 2017-12-30 DIAGNOSIS — Z79899 Other long term (current) drug therapy: Secondary | ICD-10-CM | POA: Diagnosis not present

## 2017-12-30 DIAGNOSIS — D510 Vitamin B12 deficiency anemia due to intrinsic factor deficiency: Secondary | ICD-10-CM | POA: Diagnosis not present

## 2017-12-30 DIAGNOSIS — N183 Chronic kidney disease, stage 3 (moderate): Secondary | ICD-10-CM | POA: Diagnosis not present

## 2017-12-30 DIAGNOSIS — L97211 Non-pressure chronic ulcer of right calf limited to breakdown of skin: Secondary | ICD-10-CM | POA: Diagnosis not present

## 2017-12-30 DIAGNOSIS — I872 Venous insufficiency (chronic) (peripheral): Secondary | ICD-10-CM | POA: Diagnosis not present

## 2017-12-30 DIAGNOSIS — Z7902 Long term (current) use of antithrombotics/antiplatelets: Secondary | ICD-10-CM | POA: Diagnosis not present

## 2018-01-01 DIAGNOSIS — Z8673 Personal history of transient ischemic attack (TIA), and cerebral infarction without residual deficits: Secondary | ICD-10-CM | POA: Diagnosis not present

## 2018-01-01 DIAGNOSIS — D510 Vitamin B12 deficiency anemia due to intrinsic factor deficiency: Secondary | ICD-10-CM | POA: Diagnosis not present

## 2018-01-01 DIAGNOSIS — I129 Hypertensive chronic kidney disease with stage 1 through stage 4 chronic kidney disease, or unspecified chronic kidney disease: Secondary | ICD-10-CM | POA: Diagnosis not present

## 2018-01-01 DIAGNOSIS — Z4800 Encounter for change or removal of nonsurgical wound dressing: Secondary | ICD-10-CM | POA: Diagnosis not present

## 2018-01-01 DIAGNOSIS — Z79899 Other long term (current) drug therapy: Secondary | ICD-10-CM | POA: Diagnosis not present

## 2018-01-01 DIAGNOSIS — N183 Chronic kidney disease, stage 3 (moderate): Secondary | ICD-10-CM | POA: Diagnosis not present

## 2018-01-01 DIAGNOSIS — I872 Venous insufficiency (chronic) (peripheral): Secondary | ICD-10-CM | POA: Diagnosis not present

## 2018-01-01 DIAGNOSIS — L97211 Non-pressure chronic ulcer of right calf limited to breakdown of skin: Secondary | ICD-10-CM | POA: Diagnosis not present

## 2018-01-01 DIAGNOSIS — Z7902 Long term (current) use of antithrombotics/antiplatelets: Secondary | ICD-10-CM | POA: Diagnosis not present

## 2018-01-04 ENCOUNTER — Emergency Department (HOSPITAL_COMMUNITY)
Admission: EM | Admit: 2018-01-04 | Discharge: 2018-01-04 | Disposition: A | Discharge status: Home / Self Care | Payer: Medicare Other | Attending: Emergency Medicine | Admitting: Emergency Medicine

## 2018-01-04 ENCOUNTER — Emergency Department (HOSPITAL_COMMUNITY): Payer: Medicare Other

## 2018-01-04 DIAGNOSIS — M25552 Pain in left hip: Secondary | ICD-10-CM | POA: Diagnosis not present

## 2018-01-04 DIAGNOSIS — W07XXXA Fall from chair, initial encounter: Secondary | ICD-10-CM | POA: Diagnosis not present

## 2018-01-04 DIAGNOSIS — R531 Weakness: Secondary | ICD-10-CM | POA: Diagnosis not present

## 2018-01-04 DIAGNOSIS — Z8673 Personal history of transient ischemic attack (TIA), and cerebral infarction without residual deficits: Secondary | ICD-10-CM | POA: Diagnosis not present

## 2018-01-04 DIAGNOSIS — Z7902 Long term (current) use of antithrombotics/antiplatelets: Secondary | ICD-10-CM | POA: Insufficient documentation

## 2018-01-04 DIAGNOSIS — R609 Edema, unspecified: Secondary | ICD-10-CM | POA: Diagnosis not present

## 2018-01-04 DIAGNOSIS — R279 Unspecified lack of coordination: Secondary | ICD-10-CM | POA: Diagnosis not present

## 2018-01-04 DIAGNOSIS — I129 Hypertensive chronic kidney disease with stage 1 through stage 4 chronic kidney disease, or unspecified chronic kidney disease: Secondary | ICD-10-CM | POA: Insufficient documentation

## 2018-01-04 DIAGNOSIS — N184 Chronic kidney disease, stage 4 (severe): Secondary | ICD-10-CM | POA: Insufficient documentation

## 2018-01-04 DIAGNOSIS — Z9049 Acquired absence of other specified parts of digestive tract: Secondary | ICD-10-CM | POA: Diagnosis not present

## 2018-01-04 DIAGNOSIS — R600 Localized edema: Secondary | ICD-10-CM | POA: Diagnosis not present

## 2018-01-04 DIAGNOSIS — Z743 Need for continuous supervision: Secondary | ICD-10-CM | POA: Diagnosis not present

## 2018-01-04 DIAGNOSIS — Z79899 Other long term (current) drug therapy: Secondary | ICD-10-CM | POA: Diagnosis not present

## 2018-01-04 DIAGNOSIS — S79912A Unspecified injury of left hip, initial encounter: Secondary | ICD-10-CM | POA: Diagnosis not present

## 2018-01-04 DIAGNOSIS — M79605 Pain in left leg: Secondary | ICD-10-CM | POA: Diagnosis not present

## 2018-01-04 DIAGNOSIS — R52 Pain, unspecified: Secondary | ICD-10-CM | POA: Diagnosis not present

## 2018-01-04 DIAGNOSIS — F0390 Unspecified dementia without behavioral disturbance: Secondary | ICD-10-CM | POA: Insufficient documentation

## 2018-01-04 DIAGNOSIS — M7989 Other specified soft tissue disorders: Secondary | ICD-10-CM | POA: Diagnosis not present

## 2018-01-04 DIAGNOSIS — I959 Hypotension, unspecified: Secondary | ICD-10-CM | POA: Diagnosis not present

## 2018-01-04 DIAGNOSIS — W19XXXA Unspecified fall, initial encounter: Secondary | ICD-10-CM

## 2018-01-04 DIAGNOSIS — I10 Essential (primary) hypertension: Secondary | ICD-10-CM | POA: Diagnosis not present

## 2018-01-04 NOTE — ED Triage Notes (Signed)
Pt to ER for evaluation of left lower extremity swelling, edema, and redness after sliding out of her wheelchair and landed on her leg. Pt is alert and oriented to baseline. Received 25 mcg of fentanyl.

## 2018-01-04 NOTE — Discharge Instructions (Addendum)
As discussed, your evaluation today has been largely reassuring.  But, it is important that you monitor your condition carefully, and do not hesitate to return to the ED if you develop new, or concerning changes in your condition. ? ?Otherwise, please follow-up with your physician for appropriate ongoing care. ? ?

## 2018-01-04 NOTE — ED Provider Notes (Signed)
[facility] EMERGENCY DEPARTMENT Provider Note   CSN: [id number] Arrival date & time: 01/04/18  0623     History   Chief Complaint Chief Complaint  Patient presents with  . Fall    HPI Carrie Grimes is a 84 y.o. female.  HPI Presents from a nursing facility after a fall. Herself is awake and alert, and though she has very poor hearing she seems to relate today's episode reasonably well. She notes that she was sitting in a rocking chair, when she slid out of it, landing awkwardly on her left leg. Since that time she has had sharp severe pain throughout the leg, more severe distally. No medication taken for relief. Pain is worse with any attempted motion, and she does not perform weightbearing. She denies other injuries, including head pain, neck pain or other complaints.  Past Medical History:  Diagnosis Date  . Allergic rhinitis, cause unspecified   . Anemia of chronic kidney failure, stage 4 (severe) (HCC)    Aranesp via nephrologist  . Cataracts, bilateral   . Cerebrovascular disease, unspecified    CDopplers 1/13 showed mild mixed plaque w/ 40-59% (low end) bilat ICA stenoses, the prev right CAE & patch angioplasty were patent  . Choledocholithiasis    12/11/14 CT--nonobstructive (59mm x 6 mm)  . Chronic renal insufficiency, stage IV (severe) (HCC)    Dr. Marval Regal. Not a candidate for renal replacement therapy.  Pt does not want this, either.  . Dementia with behavioral disturbance    depakote helpful  . Diaphragmatic hernia without mention of obstruction or gangrene   . Essential hypertension   . Hyperkalemia 06/12/2015   diminished renal excretion: as of 05/2016, kayexolate started, low K diet.  Kayexalate changed to veltassa 2019, veltassa increased to 2 packets qd---potassium goal 5.7 or less.  . Impaired mobility    uses walker, wheelchair  . Pernicious anemia    + amemia of CRI  . Pure hypercholesterolemia   . RBBB   . Retinal  hemorrhage of right eye   . Unspecified cerebral artery occlusion with cerebral infarction    MRI Brain 12/12 showed large remote right hemispheric infarct w/ encephalomalacia w/ prominent small vessel dis superimposed on atrophy... (residual deficits are diminished sensation on left side, left hand grip weakness,   . Unspecified disorder resulting from impaired renal function    hyperkalemia and anemia  . Unspecified hearing loss   . Unspecified venous (peripheral) insufficiency    lasix  . Unspecified vitamin D deficiency     Patient Active Problem List   Diagnosis Date Noted  . Debilitated patient 11/24/2017  . Asymmetric edema of both lower extremities 10/28/2017  . Abnormal serum thyroid stimulating hormone (TSH) level 08/20/2017  . Hyperglycemia 08/20/2017  . Chronic renal insufficiency, stage IV (severe) ([place]) 10/26/2015  . Hyperkalemia 06/12/2015  . HTN (hypertension), benign 11/02/2013  . Venous stasis dermatitis 11/02/2013  . Cellulitis of both lower extremities 09/29/2013  . Urinary incontinence 09/09/2012  . Dementia with behavioral disturbance 06/26/2011  . Edema 10/23/2010  . DJD (degenerative joint disease) 10/23/2010  . ALLERGIC RHINITIS 10/08/2008  . ANEMIA 08/08/2008  . VITAMIN D DEFICIENCY 07/22/2008  . Presbycusis of both ears 07/22/2008  . CKD (chronic kidney disease), stage IV ([place]) 10/08/2007  . HYPERCHOLESTEROLEMIA 04/12/2007  . Pernicious anemia 04/12/2007  . CEREBROVASCULAR DISEASE 04/12/2007  . Chronic venous insufficiency 04/09/2007  . History of completed stroke 02/06/2007  . HIATAL HERNIA 02/06/2007    Past Surgical History:  Procedure Laterality Date  . CAROTID ENDARTERECTOMY    . CHOLECYSTECTOMY    . ENDOSCOPIC RETROGRADE CHOLANGIOPANCREATOGRAPHY (ERCP) WITH PROPOFOL N/A 03/09/2015   Procedure: ENDOSCOPIC RETROGRADE CHOLANGIOPANCREATOGRAPHY (ERCP) WITH PROPOFOL;  Surgeon: Milus Banister, MD;  Location: [facility];  Service: Endoscopy;   Laterality: N/A;  . laser surgery on right eye  12/02/2012   Dr. [facility]aird [facility]     OB History   None      Home Medications    Prior to Admission medications   Medication Sig Start Date End Date Taking? Authorizing Provider  acetaminophen (TYLENOL) 500 MG tablet Take 1,000 mg by mouth 3 (three) times daily.     [provider]  acetaminophen (TYLENOL) 500 MG tablet Take 500 mg by mouth every 8 (eight) hours as needed for mild pain or moderate pain.    [provider]  ammonium lactate (AMLACTIN) 12 % cream Apply topically to lower legs at bedtime every night for dry/thick skin 06/12/17   McGowen, Adrian Blackwater, MD  cetirizine (ZYRTEC) 10 MG tablet Take 10 mg by mouth daily.    [provider]  clopidogrel (PLAVIX) 75 MG tablet Take 1 tablet (75 mg total) by mouth daily. RESTART THIS IN 3 DAYS Patient taking differently: Take 75 mg by mouth daily.  03/09/15   Milus Banister, MD  cyanocobalamin (,VITAMIN B-12,) 1000 MCG/ML injection Inject 1,000 mcg into the skin every 30 (thirty) days. On the 12th of every month.    [provider]  divalproex (DEPAKOTE SPRINKLE) 125 MG capsule 1 tab po qhs Patient taking differently: Take 125 mg by mouth at bedtime.  10/24/16   McGowen, Adrian Blackwater, MD  fluticasone (CUTIVATE) 0.05 % cream Apply to affected pinkish rash of both lower extremities as needed Patient taking differently: Apply 1 application topically 2 (two) times daily. For rash on legs 05/22/17   McGowen, Adrian Blackwater, MD  furosemide (LASIX) 20 MG tablet Take 1 tablet by mouth every evening.  12/15/17   [provider]  furosemide (LASIX) 40 MG tablet Take 1 tablet (40 mg total) by mouth 2 (two) times daily. Patient taking differently: Take 40 mg by mouth daily.  11/11/17   McGowen, Adrian Blackwater, MD  hydrALAZINE (APRESOLINE) 25 MG tablet Take 1 tablet (25 mg total) by mouth 3 (three) times daily. 06/25/16   McGowen, Adrian Blackwater, MD  hydrocortisone 1 %  ointment Apply 1 application topically 2 (two) times daily. Apply to BOTH Lower Extremities Twice a day Patient not taking: Reported on 12/25/2017 10/30/17   Roxan Hockey, MD  loperamide (IMODIUM A-D) 2 MG tablet Take 4 mg by mouth daily.     [provider]  Multiple Vitamins-Iron (MULTIVITAMINS WITH IRON) TABS tablet Take 1 tablet by mouth daily.    [provider]  oxybutynin (DITROPAN-XL) 5 MG 24 hr tablet Take 5 mg by mouth at bedtime.  05/13/17   [provider]  patiromer Daryll Drown) 8.4 g packet 1 packet po bid.  Do not give within 3 hours of taking any other medications.  May give with OR without food. Patient taking differently: Take 8.4 g by mouth 2 (two) times daily. Do not give within 3 hours of taking any other medications.  May give with OR without food. 11/11/17   McGowen, Adrian Blackwater, MD  ranitidine (ZANTAC) 75 MG tablet Take 75 mg by mouth daily.    [provider]  vitamin C (ASCORBIC ACID) 500 MG tablet Take 500  mg by mouth daily.     [provider]    Family History Family History  Problem Relation Age of Onset  . Heart disease Mother   . Heart disease Father     Social History Social History   Tobacco Use  . Smoking status: Never Smoker  . Smokeless tobacco: Never Used  Substance Use Topics  . Alcohol use: No  . Drug use: No     Allergies   Procaine hcl   Review of Systems Review of Systems  Constitutional:       Per HPI, otherwise negative  HENT:       Per HPI, otherwise negative  Respiratory:       Per HPI, otherwise negative  Cardiovascular:       Per HPI, otherwise negative  Gastrointestinal: Negative for nausea and vomiting.  Endocrine:       Negative aside from HPI  Genitourinary:       Neg aside from HPI   Musculoskeletal:       Per HPI, otherwise negative  Skin: Negative for wound.  Neurological: Positive for weakness. Negative for syncope.     Physical Exam Updated Vital Signs BP (!)  155/48   Pulse 61   Temp 97.9 F (36.6 C) (Oral)   Resp 19   SpO2 99%   Physical Exam  Constitutional: She is oriented to person, place, and time. She has a sickly appearance. No distress.  Deconditioned, sickly appearing elderly female, but awake and alert  HENT:  Head: Normocephalic and atraumatic.  Eyes: Conjunctivae and EOM are normal.  Cardiovascular: Normal rate and regular rhythm.  Pulmonary/Chest: Effort normal and breath sounds normal. No stridor. No respiratory distress.  Abdominal: She exhibits no distension.  Musculoskeletal: She exhibits no edema.       Legs: Neurological: She is alert and oriented to person, place, and time. No cranial nerve deficit.  Extremely poor hearing, otherwise unremarkable cranial nerve exam. Patient has preserved sensation in both lower extremities distally, can move each leg, though with symmetric diminished strength proximal and distal.   Skin: Skin is warm and dry.  Psychiatric: She has a normal mood and affect.  Nursing note and vitals reviewed.    ED Treatments / Results  Labs (all labs ordered are listed, but only abnormal results are displayed) Labs Reviewed - No data to display  EKG EKG Interpretation  Date/Time:  Sunday January 04 2018 09:18:23 EDT Ventricular Rate:  63 PR Interval:    QRS Duration: 144 QT Interval:  449 QTC Calculation: 453 R Axis:   -108 Text Interpretation:  Sinus rhythm Prolonged PR interval Nonspecific IVCD with LAD Baseline wander in lead(s) I III aVL aVF Confirmed by Carmin Muskrat [phone]) on 01/04/2018 9:41:06 AM   Radiology Dg Ankle Complete Left  Result Date: 01/04/2018 CLINICAL DATA:  Lower extremity swelling after sliding from a wheelchair. Erythema. EXAM: LEFT ANKLE COMPLETE - 3+ VIEW COMPARISON:  08/21/2009 FINDINGS: Diffuse subcutaneous edema along the distal calf and ankle. Diffuse bony demineralization. Distal calf atherosclerotic vascular calcifications. Old deformity of the distal  fifth metatarsal metaphysis favoring healed fracture. Plantar calcaneal spur. No malleolar fracture identified. IMPRESSION: 1. Severe bony demineralization. 2. Diffuse subcutaneous edema in the calf and ankle. 3. Vascular calcifications noted. 4. Plantar calcaneal spur. Electronically Signed   By: Van Clines M.D.   On: 01/04/2018 11:08   Dg Knee Complete 4 Views Left  Result Date: 01/04/2018 CLINICAL DATA:  84 year old female with a history of  lower extremity swelling and edema EXAM: LEFT KNEE - COMPLETE 4+ VIEW COMPARISON:  None. FINDINGS: Osteopenia. No joint effusion. No acute displaced fracture. Soft tissue swelling of the visualized leg. Advanced atherosclerotic calcifications. IMPRESSION: Negative for acute bony abnormality. Osteopenia. Nonspecific soft tissue swelling. Advanced atherosclerosis Electronically Signed   By: Corrie Mckusick D.O.   On: 01/04/2018 11:07   Dg Hip Unilat With Pelvis 2-3 Views Left  Result Date: 01/04/2018 CLINICAL DATA:  Fall. Left hip pain and swelling. Initial encounter. EXAM: DG HIP (WITH OR WITHOUT PELVIS) 2-3V LEFT COMPARISON:  None. FINDINGS: There is no evidence of hip fracture or dislocation. There is no evidence of arthropathy or other focal bone abnormality. Generalized osteopenia noted. Extensive peripheral vascular calcification seen. IMPRESSION: No evidence of fracture or dislocation. Electronically Signed   By: Earle Gell M.D.   On: 01/04/2018 11:07    Procedures Procedures (including critical care time)  Medications Ordered in ED Medications - No data to display   Initial Impression / Assessment and Plan / ED Course  I have reviewed the triage vital signs and the nursing notes.  Pertinent labs & imaging results that were available during my care of the patient were reviewed by me and considered in my medical decision making (see chart for details).     12:03 PM On repeat exam the patient is sleeping. This elderly female presents after  minor fall at her nursing facility. The patient complains only of left lower extremity pain, and x-rays here are reassuring. Without evidence for acute new pathology, without other complaints, patient returned to her nursing facility.   Final Clinical Impressions(s) / ED Diagnoses  Fall, initial encounter   Carmin Muskrat, MD 01/04/18 [phone]

## 2018-01-04 NOTE — ED Notes (Signed)
Called [facility] for pt transport back to [facility]

## 2018-01-05 DIAGNOSIS — Z7902 Long term (current) use of antithrombotics/antiplatelets: Secondary | ICD-10-CM | POA: Diagnosis not present

## 2018-01-05 DIAGNOSIS — I872 Venous insufficiency (chronic) (peripheral): Secondary | ICD-10-CM | POA: Diagnosis not present

## 2018-01-05 DIAGNOSIS — L97211 Non-pressure chronic ulcer of right calf limited to breakdown of skin: Secondary | ICD-10-CM | POA: Diagnosis not present

## 2018-01-05 DIAGNOSIS — N183 Chronic kidney disease, stage 3 (moderate): Secondary | ICD-10-CM | POA: Diagnosis not present

## 2018-01-05 DIAGNOSIS — I129 Hypertensive chronic kidney disease with stage 1 through stage 4 chronic kidney disease, or unspecified chronic kidney disease: Secondary | ICD-10-CM | POA: Diagnosis not present

## 2018-01-05 DIAGNOSIS — Z4800 Encounter for change or removal of nonsurgical wound dressing: Secondary | ICD-10-CM | POA: Diagnosis not present

## 2018-01-05 DIAGNOSIS — D510 Vitamin B12 deficiency anemia due to intrinsic factor deficiency: Secondary | ICD-10-CM | POA: Diagnosis not present

## 2018-01-05 DIAGNOSIS — Z79899 Other long term (current) drug therapy: Secondary | ICD-10-CM | POA: Diagnosis not present

## 2018-01-05 DIAGNOSIS — Z8673 Personal history of transient ischemic attack (TIA), and cerebral infarction without residual deficits: Secondary | ICD-10-CM | POA: Diagnosis not present

## 2018-01-06 ENCOUNTER — Inpatient Hospital Stay (HOSPITAL_COMMUNITY): Admission: RE | Admit: 2018-01-06 | Payer: Medicare Other | Source: Ambulatory Visit

## 2018-01-06 ENCOUNTER — Telehealth: Payer: Self-pay | Admitting: Family Medicine

## 2018-01-06 NOTE — Telephone Encounter (Signed)
She has marked L leg swelling at baseline, plus increased swelling from a contusion is to be expected with her injury. Repeat x-rays are not indicated, but if they feel like her swelling is increased significantly compared to her baseline (or if they are not sure), then I recommend she be brought in to see me.

## 2018-01-06 NOTE — Telephone Encounter (Signed)
Copied from [place] [id number]. Topic: Quick Communication - See Telephone Encounter >> Jan 06, 2018  3:46 PM Synthia Innocent wrote: CRM for notification. See Telephone encounter for: 01/06/18. Seen in ER 01/04/18 for fall, fell hurt her left leg, negative xray for hip, knee and ankle, [facility] requesting another xray due to swelling in leg. Fax # [phone]

## 2018-01-06 NOTE — Telephone Encounter (Signed)
Please advise. Thanks.  

## 2018-01-07 NOTE — Telephone Encounter (Addendum)
SW nurse at [facility], she stated that pt has complained a lot about pain in her leg. She feels like pt should be seen.  Apt made for 01/08/18 at 11:00am (30 min).

## 2018-01-08 ENCOUNTER — Telehealth: Payer: Self-pay | Admitting: *Deleted

## 2018-01-08 ENCOUNTER — Encounter: Payer: Self-pay | Admitting: Family Medicine

## 2018-01-08 ENCOUNTER — Ambulatory Visit (INDEPENDENT_AMBULATORY_CARE_PROVIDER_SITE_OTHER): Payer: Medicare Other | Admitting: Family Medicine

## 2018-01-08 VITALS — BP 160/56 | HR 66 | Temp 97.8°F

## 2018-01-08 DIAGNOSIS — S93402D Sprain of unspecified ligament of left ankle, subsequent encounter: Secondary | ICD-10-CM | POA: Diagnosis not present

## 2018-01-08 DIAGNOSIS — I872 Venous insufficiency (chronic) (peripheral): Secondary | ICD-10-CM

## 2018-01-08 DIAGNOSIS — D510 Vitamin B12 deficiency anemia due to intrinsic factor deficiency: Secondary | ICD-10-CM | POA: Diagnosis not present

## 2018-01-08 DIAGNOSIS — R609 Edema, unspecified: Secondary | ICD-10-CM

## 2018-01-08 DIAGNOSIS — Z4800 Encounter for change or removal of nonsurgical wound dressing: Secondary | ICD-10-CM | POA: Diagnosis not present

## 2018-01-08 DIAGNOSIS — Z8673 Personal history of transient ischemic attack (TIA), and cerebral infarction without residual deficits: Secondary | ICD-10-CM | POA: Diagnosis not present

## 2018-01-08 DIAGNOSIS — Z7902 Long term (current) use of antithrombotics/antiplatelets: Secondary | ICD-10-CM | POA: Diagnosis not present

## 2018-01-08 DIAGNOSIS — I129 Hypertensive chronic kidney disease with stage 1 through stage 4 chronic kidney disease, or unspecified chronic kidney disease: Secondary | ICD-10-CM | POA: Diagnosis not present

## 2018-01-08 DIAGNOSIS — L97211 Non-pressure chronic ulcer of right calf limited to breakdown of skin: Secondary | ICD-10-CM | POA: Diagnosis not present

## 2018-01-08 DIAGNOSIS — Z79899 Other long term (current) drug therapy: Secondary | ICD-10-CM | POA: Diagnosis not present

## 2018-01-08 DIAGNOSIS — N183 Chronic kidney disease, stage 3 (moderate): Secondary | ICD-10-CM | POA: Diagnosis not present

## 2018-01-08 MED ORDER — TRAMADOL HCL 50 MG PO TABS: 50.0000 mg | ORAL_TABLET | Freq: Four times a day (QID) | ORAL | 0 refills | Status: DC | PRN

## 2018-01-08 NOTE — Progress Notes (Signed)
OFFICE VISIT  01/08/2018   CC:  Chief Complaint  Patient presents with  . Fall    left leg pain and swelling     HPI:    Patient is a 84 y.o. Caucasian female who presents for eval of left leg pain and swelling. She was taken to the ED 4 d/a after she slid out of her rocking chair at her NH.  She was propped on some pillows and simply slid forward out of the chair accidentally. She c/o left leg pain and exam was reassuring.  X-rays of left hip, knee, and ankle were all neg for fracture.  She continued to have pain and swelling so she is here for recheck. She does have significant chronic LE venous insufficiency edema with L LL circumference always quite a bit larger than R LL circumference. Currently says it hurts in her left ankle.  Denies pain in hip, knee, femur, or prox tib/fib region.   Past Medical History:  Diagnosis Date  . Allergic rhinitis, cause unspecified   . Anemia of chronic kidney failure, stage 4 (severe) (HCC)    Aranesp via nephrologist  . Cataracts, bilateral   . Cerebrovascular disease, unspecified    CDopplers 1/13 showed mild mixed plaque w/ 40-59% (low end) bilat ICA stenoses, the prev right CAE & patch angioplasty were patent  . Choledocholithiasis    12/11/14 CT--nonobstructive (39mm x 6 mm)  . Chronic renal insufficiency, stage IV (severe) (HCC)    Dr. Marval Regal. Not a candidate for renal replacement therapy.  Pt does not want this, either.  . Dementia with behavioral disturbance    depakote helpful  . Diaphragmatic hernia without mention of obstruction or gangrene   . Essential hypertension   . Hyperkalemia 06/12/2015   diminished renal excretion: as of 05/2016, kayexolate started, low K diet.  Kayexalate changed to veltassa 2019, veltassa increased to 2 packets qd---potassium goal 5.7 or less.  . Impaired mobility    uses walker, wheelchair  . Pernicious anemia    + amemia of CRI  . Pure hypercholesterolemia   . RBBB   . Retinal hemorrhage of  right eye   . Unspecified cerebral artery occlusion with cerebral infarction    MRI Brain 12/12 showed large remote right hemispheric infarct w/ encephalomalacia w/ prominent small vessel dis superimposed on atrophy... (residual deficits are diminished sensation on left side, left hand grip weakness,   . Unspecified disorder resulting from impaired renal function    hyperkalemia and anemia  . Unspecified hearing loss   . Unspecified venous (peripheral) insufficiency    lasix  . Unspecified vitamin D deficiency     Past Surgical History:  Procedure Laterality Date  . CAROTID ENDARTERECTOMY    . CHOLECYSTECTOMY    . ENDOSCOPIC RETROGRADE CHOLANGIOPANCREATOGRAPHY (ERCP) WITH PROPOFOL N/A 03/09/2015   Procedure: ENDOSCOPIC RETROGRADE CHOLANGIOPANCREATOGRAPHY (ERCP) WITH PROPOFOL;  Surgeon: Milus Banister, MD;  Location: [facility];  Service: Endoscopy;  Laterality: N/A;  . laser surgery on right eye  12/02/2012   Dr. Baird Cancer at [facility]    Outpatient Medications Prior to Visit  Medication Sig Dispense Refill  . acetaminophen (TYLENOL) 500 MG tablet Take 1,000 mg by mouth 3 (three) times daily.     Marland Kitchen acetaminophen (TYLENOL) 500 MG tablet Take 500 mg by mouth every 8 (eight) hours as needed for mild pain or moderate pain.    Marland Kitchen ammonium lactate (AMLACTIN) 12 % cream Apply topically to lower legs at bedtime every night for dry/thick  skin 385 g 6  . cetirizine (ZYRTEC) 10 MG tablet Take 10 mg by mouth daily.    . clopidogrel (PLAVIX) 75 MG tablet Take 1 tablet (75 mg total) by mouth daily. RESTART THIS IN 3 DAYS (Patient taking differently: Take 75 mg by mouth daily. ) 30 tablet 3  . cyanocobalamin (,VITAMIN B-12,) 1000 MCG/ML injection Inject 1,000 mcg into the skin every 30 (thirty) days. On the 12th of every month.    . divalproex (DEPAKOTE SPRINKLE) 125 MG capsule 1 tab po qhs (Patient taking differently: Take 125 mg by mouth at bedtime. ) 30 capsule 0  . fluticasone (CUTIVATE) 0.05  % cream Apply to affected pinkish rash of both lower extremities as needed (Patient taking differently: Apply 1 application topically 2 (two) times daily. For rash on legs) 30 g 1  . furosemide (LASIX) 20 MG tablet Take 1 tablet by mouth every evening.     . furosemide (LASIX) 40 MG tablet Take 1 tablet (40 mg total) by mouth 2 (two) times daily. (Patient taking differently: Take 40 mg by mouth daily. ) 60 tablet 1  . hydrALAZINE (APRESOLINE) 25 MG tablet Take 1 tablet (25 mg total) by mouth 3 (three) times daily. 90 tablet 0  . loperamide (IMODIUM A-D) 2 MG tablet Take 4 mg by mouth daily.     . Multiple Vitamins-Iron (MULTIVITAMINS WITH IRON) TABS tablet Take 1 tablet by mouth daily.    Marland Kitchen oxybutynin (DITROPAN-XL) 5 MG 24 hr tablet Take 5 mg by mouth at bedtime.     . patiromer (VELTASSA) 8.4 g packet 1 packet po bid.  Do not give within 3 hours of taking any other medications.  May give with OR without food. (Patient taking differently: Take 8.4 g by mouth 2 (two) times daily. Do not give within 3 hours of taking any other medications.  May give with OR without food.) 60 packet 0  . ranitidine (ZANTAC) 75 MG tablet Take 75 mg by mouth daily.    . vitamin C (ASCORBIC ACID) 500 MG tablet Take 500 mg by mouth daily.     . hydrocortisone 1 % ointment Apply 1 application topically 2 (two) times daily. Apply to BOTH Lower Extremities Twice a day (Patient not taking: Reported on 12/25/2017) 25 g 0   No facility-administered medications prior to visit.     Allergies  Allergen Reactions  . Procaine Hcl Other (See Comments)    REACTION: pt states reaction to shot at dentist office w/ tachycardia \\T \ SOB (? if really from combination w/epi)    ROS As per HPI  PE: Blood pressure (!) 160/56, pulse 66, temperature 97.8 F (36.6 C), temperature source Oral, SpO2 96 %. Gen: Alert, well appearing.  Patient is oriented to person and general situation. AFFECT: pleasantly demented, with some increased  emotional lability today.  VERY HOH. Sitting up in manual WC. Left hip, leg, knee all w/out TTP. L ankle with diffuse TTP and pain with ROM.  Some ecchymoses noted about the lateral aspect of ankle.  She has 4-5+ pitting edema in the L lower leg into top of foot, with mild pinkish discoloration of skin of pretibial region---as per her usual/baseline.  Toes/foot pink, cap refill good.     LABS:    Chemistry      Component Value Date/Time   NA 144 12/25/2017 2214   NA 141 09/09/2017   K 4.9 12/25/2017 2214   CL 113 (H) 12/25/2017 2214   CO2 22 12/25/2017  2214   BUN 70 (H) 12/25/2017 2214   BUN 66 (A) 09/09/2017   CREATININE 2.22 (H) 12/25/2017 2214   CREATININE 2.16 (H) 06/08/2015 1505   GLU 105 09/09/2017      Component Value Date/Time   CALCIUM 9.2 12/25/2017 2214   ALKPHOS 81 08/20/2017 1347   AST 18 08/20/2017 1347   ALT 5 (L) 08/20/2017 1347   BILITOT 0.5 08/20/2017 1347      IMPRESSION AND PLAN:  Left ankle pain, suspect sprain-->sustained with low impact fall/slip out of her rocking chair 4 d/a. Imaging at ED eval reassuring. Plan: continue wheelchair--no wt bearing for the time being. Continue tylenol at her NH but I also added tramadol 50mg , 1 q6h prn, #30, no RF. F/u for recheck in 1 wk.  An After Visit Summary was printed and given to the patient.  FOLLOW UP: Return in about 1 week (around 01/15/2018) for f/u L LL/ankle pain.  Signed:  Crissie Sickles, MD           01/08/2018

## 2018-01-08 NOTE — Telephone Encounter (Signed)
SW Dr. Anitra Lauth and was given verbal order to print Rx.  Rx printed and given to Dr. Anitra Lauth to sign.  Rx was picked up by [place].

## 2018-01-08 NOTE — Addendum Note (Signed)
Addended by: Onalee Hua on: 01/08/2018 04:03 PM   Modules accepted: Orders

## 2018-01-08 NOTE — Telephone Encounter (Signed)
Copied from [place] [id number]. Topic: General - Other >> Jan 08, 2018  3:30 PM Yvette Rack wrote: Reason for CRM: med tech [place] from [facility] [phone] need a printed hard script for the traMADol (ULTRAM) 50 MG tablet she will pick up the RX when its ready

## 2018-01-12 DIAGNOSIS — D510 Vitamin B12 deficiency anemia due to intrinsic factor deficiency: Secondary | ICD-10-CM | POA: Diagnosis not present

## 2018-01-12 DIAGNOSIS — I872 Venous insufficiency (chronic) (peripheral): Secondary | ICD-10-CM | POA: Diagnosis not present

## 2018-01-12 DIAGNOSIS — Z79899 Other long term (current) drug therapy: Secondary | ICD-10-CM | POA: Diagnosis not present

## 2018-01-12 DIAGNOSIS — I129 Hypertensive chronic kidney disease with stage 1 through stage 4 chronic kidney disease, or unspecified chronic kidney disease: Secondary | ICD-10-CM | POA: Diagnosis not present

## 2018-01-12 DIAGNOSIS — Z7902 Long term (current) use of antithrombotics/antiplatelets: Secondary | ICD-10-CM | POA: Diagnosis not present

## 2018-01-12 DIAGNOSIS — Z8673 Personal history of transient ischemic attack (TIA), and cerebral infarction without residual deficits: Secondary | ICD-10-CM | POA: Diagnosis not present

## 2018-01-12 DIAGNOSIS — L97211 Non-pressure chronic ulcer of right calf limited to breakdown of skin: Secondary | ICD-10-CM | POA: Diagnosis not present

## 2018-01-12 DIAGNOSIS — Z4800 Encounter for change or removal of nonsurgical wound dressing: Secondary | ICD-10-CM | POA: Diagnosis not present

## 2018-01-12 DIAGNOSIS — N183 Chronic kidney disease, stage 3 (moderate): Secondary | ICD-10-CM | POA: Diagnosis not present

## 2018-01-13 ENCOUNTER — Encounter: Payer: Self-pay | Admitting: Podiatry

## 2018-01-13 ENCOUNTER — Ambulatory Visit (INDEPENDENT_AMBULATORY_CARE_PROVIDER_SITE_OTHER): Payer: Medicare Other | Admitting: Podiatry

## 2018-01-13 DIAGNOSIS — M79676 Pain in unspecified toe(s): Secondary | ICD-10-CM | POA: Diagnosis not present

## 2018-01-13 DIAGNOSIS — B351 Tinea unguium: Secondary | ICD-10-CM | POA: Diagnosis not present

## 2018-01-13 DIAGNOSIS — D689 Coagulation defect, unspecified: Secondary | ICD-10-CM

## 2018-01-13 DIAGNOSIS — S80921A Unspecified superficial injury of right lower leg, initial encounter: Secondary | ICD-10-CM | POA: Diagnosis not present

## 2018-01-13 NOTE — Progress Notes (Signed)
Patient ID: LAMYIAH CRAWSHAW, female   DOB: Apr 26, 1921, 84 y.o.   MRN: [id number] Complaint:  Visit Type: Patient returns to my office for continued preventative foot care services. Patient presents to the office in a wheelchair accompanied by [employer]. Complaint: Patient states" my nails have grown long and thick and become painful to walk and wear shoe. The patient presents for preventative foot care services. No changes to ROS.  Patient has had injury to left ankle and has pain and swelling left ankle.  Podiatric Exam: Vascular: dorsalis pedis and posterior tibial pulses are not palpable bilateral. Capillary return is immediate. Cold feet noted. Sensorium: Normal Semmes Weinstein monofilament test. Normal tactile sensation bilaterally. Nail Exam: Pt has thick disfigured discolored nails with subungual debris noted bilateral entire nail hallux through fifth toenails Ulcer Exam: There is no evidence of ulcer or pre-ulcerative changes or infection. Orthopedic Exam: Muscle tone and strength are WNL. No limitations in general ROM. No crepitus or effusions noted. Foot type and digits show no abnormalities. Bony prominences are unremarkable. Skin: No Porokeratosis. No infection or ulcers  Diagnosis:  Onychomycosis, , Pain in right toe, pain in left toes  Treatment & Plan Procedures and Treatment: Consent by patient was obtained for treatment procedures. The patient understood the discussion of treatment and procedures well. All questions were answered thoroughly reviewed. Debridement of mycotic and hypertrophic toenails, 1 through 5 bilateral and clearing of subungual debris. No ulceration, no infection noted.   Return Visit-Office Procedure: Patient instructed to return to the office for a follow up visit 3 months for continued evaluation and treatment.  Gardiner Barefoot DPM

## 2018-01-15 ENCOUNTER — Encounter: Payer: Self-pay | Admitting: Family Medicine

## 2018-01-15 ENCOUNTER — Ambulatory Visit (INDEPENDENT_AMBULATORY_CARE_PROVIDER_SITE_OTHER): Payer: Medicare Other | Admitting: Family Medicine

## 2018-01-15 ENCOUNTER — Encounter: Payer: Self-pay | Admitting: *Deleted

## 2018-01-15 VITALS — BP 128/64 | HR 67 | Temp 97.5°F | Resp 16

## 2018-01-15 DIAGNOSIS — Z79899 Other long term (current) drug therapy: Secondary | ICD-10-CM | POA: Diagnosis not present

## 2018-01-15 DIAGNOSIS — D510 Vitamin B12 deficiency anemia due to intrinsic factor deficiency: Secondary | ICD-10-CM | POA: Diagnosis not present

## 2018-01-15 DIAGNOSIS — L97211 Non-pressure chronic ulcer of right calf limited to breakdown of skin: Secondary | ICD-10-CM | POA: Diagnosis not present

## 2018-01-15 DIAGNOSIS — I872 Venous insufficiency (chronic) (peripheral): Secondary | ICD-10-CM

## 2018-01-15 DIAGNOSIS — Z8673 Personal history of transient ischemic attack (TIA), and cerebral infarction without residual deficits: Secondary | ICD-10-CM | POA: Diagnosis not present

## 2018-01-15 DIAGNOSIS — I129 Hypertensive chronic kidney disease with stage 1 through stage 4 chronic kidney disease, or unspecified chronic kidney disease: Secondary | ICD-10-CM | POA: Diagnosis not present

## 2018-01-15 DIAGNOSIS — Z4800 Encounter for change or removal of nonsurgical wound dressing: Secondary | ICD-10-CM | POA: Diagnosis not present

## 2018-01-15 DIAGNOSIS — N183 Chronic kidney disease, stage 3 (moderate): Secondary | ICD-10-CM | POA: Diagnosis not present

## 2018-01-15 DIAGNOSIS — Z7902 Long term (current) use of antithrombotics/antiplatelets: Secondary | ICD-10-CM | POA: Diagnosis not present

## 2018-01-15 DIAGNOSIS — S93402D Sprain of unspecified ligament of left ankle, subsequent encounter: Secondary | ICD-10-CM | POA: Diagnosis not present

## 2018-01-15 DIAGNOSIS — N184 Chronic kidney disease, stage 4 (severe): Secondary | ICD-10-CM | POA: Diagnosis not present

## 2018-01-15 MED ORDER — TRAMADOL HCL 50 MG PO TABS: 50.0000 mg | ORAL_TABLET | Freq: Four times a day (QID) | ORAL | 0 refills | Status: DC | PRN

## 2018-01-15 NOTE — Progress Notes (Signed)
OFFICE VISIT  01/15/2018   CC:  Chief Complaint  Patient presents with  . Follow-up    Left LL/ankle pain    HPI:    Patient is a 84 y.o. Caucasian female who has advanced dementia who presents unaccompanied for 1 week f/u left ankle sprain sustained when she slid out of her rocking chair about 11 days ago. Plain films all neg for fracture.  Primary area of pain was L ankle when I saw her 1 week ago. Plan at last visit was: continue wheelchair--no wt bearing for the time being. Continue tylenol at her NH but I also added tramadol 50mg , 1 q6h prn, #30, no RF.  Of note, I found her bilat LL venous insuff edema to be at her baseline when I saw her last week.  Interim Hx:  Still requiring full assistance for ADLs but she is very happy that she was able to dress herself for the most part today. She says the tramadol does help her pain well.  No new symptoms. No fevers.  No skin changes in LE's compared to last visit.   Past Medical History:  Diagnosis Date  . Allergic rhinitis, cause unspecified   . Anemia of chronic kidney failure, stage 4 (severe) (HCC)    Aranesp via nephrologist  . Cataracts, bilateral   . Cerebrovascular disease, unspecified    CDopplers 1/13 showed mild mixed plaque w/ 40-59% (low end) bilat ICA stenoses, the prev right CAE & patch angioplasty were patent  . Choledocholithiasis    12/11/14 CT--nonobstructive (43mm x 6 mm)  . Chronic renal insufficiency, stage IV (severe) (HCC)    Dr. Marval Regal. Not a candidate for renal replacement therapy.  Pt does not want this, either.  . Dementia with behavioral disturbance    depakote helpful  . Diaphragmatic hernia without mention of obstruction or gangrene   . Essential hypertension   . Hyperkalemia 06/12/2015   diminished renal excretion: as of 05/2016, kayexolate started, low K diet.  Kayexalate changed to veltassa 2019, veltassa increased to 2 packets qd---potassium goal 5.7 or less.  . Impaired mobility    uses  walker, wheelchair  . Pernicious anemia    + amemia of CRI  . Pure hypercholesterolemia   . RBBB   . Retinal hemorrhage of right eye   . Unspecified cerebral artery occlusion with cerebral infarction    MRI Brain 12/12 showed large remote right hemispheric infarct w/ encephalomalacia w/ prominent small vessel dis superimposed on atrophy... (residual deficits are diminished sensation on left side, left hand grip weakness,   . Unspecified disorder resulting from impaired renal function    hyperkalemia and anemia  . Unspecified hearing loss   . Unspecified venous (peripheral) insufficiency    lasix  . Unspecified vitamin D deficiency     Past Surgical History:  Procedure Laterality Date  . CAROTID ENDARTERECTOMY    . CHOLECYSTECTOMY    . ENDOSCOPIC RETROGRADE CHOLANGIOPANCREATOGRAPHY (ERCP) WITH PROPOFOL N/A 03/09/2015   Procedure: ENDOSCOPIC RETROGRADE CHOLANGIOPANCREATOGRAPHY (ERCP) WITH PROPOFOL;  Surgeon: Milus Banister, MD;  Location: [facility];  Service: Endoscopy;  Laterality: N/A;  . laser surgery on right eye  12/02/2012   Dr. [facility]    Outpatient Medications Prior to Visit  Medication Sig Dispense Refill  . acetaminophen (TYLENOL) 500 MG tablet Take 1,000 mg by mouth 3 (three) times daily.     Marland Kitchen acetaminophen (TYLENOL) 500 MG tablet Take 500 mg by mouth every 8 (eight) hours as needed  for mild pain or moderate pain.    Marland Kitchen ammonium lactate (AMLACTIN) 12 % cream Apply topically to lower legs at bedtime every night for dry/thick skin 385 g 6  . cetirizine (ZYRTEC) 10 MG tablet Take 10 mg by mouth daily.    . clopidogrel (PLAVIX) 75 MG tablet Take 1 tablet (75 mg total) by mouth daily. RESTART THIS IN 3 DAYS (Patient taking differently: Take 75 mg by mouth daily. ) 30 tablet 3  . cyanocobalamin (,VITAMIN B-12,) 1000 MCG/ML injection Inject 1,000 mcg into the skin every 30 (thirty) days. On the 12th of every month.    . divalproex (DEPAKOTE SPRINKLE) 125 MG  capsule 1 tab po qhs (Patient taking differently: Take 125 mg by mouth at bedtime. ) 30 capsule 0  . fluticasone (CUTIVATE) 0.05 % cream Apply to affected pinkish rash of both lower extremities as needed (Patient taking differently: Apply 1 application topically 2 (two) times daily. For rash on legs) 30 g 1  . furosemide (LASIX) 40 MG tablet Take 1 tablet (40 mg total) by mouth 2 (two) times daily. (Patient taking differently: Take 40 mg by mouth daily. ) 60 tablet 1  . hydrALAZINE (APRESOLINE) 25 MG tablet Take 1 tablet (25 mg total) by mouth 3 (three) times daily. 90 tablet 0  . loperamide (IMODIUM A-D) 2 MG tablet Take 4 mg by mouth daily.     . Multiple Vitamins-Iron (MULTIVITAMINS WITH IRON) TABS tablet Take 1 tablet by mouth daily.    Marland Kitchen oxybutynin (DITROPAN-XL) 5 MG 24 hr tablet Take 5 mg by mouth at bedtime.     . patiromer (VELTASSA) 8.4 g packet 1 packet po bid.  Do not give within 3 hours of taking any other medications.  May give with OR without food. (Patient taking differently: Take 8.4 g by mouth 2 (two) times daily. Do not give within 3 hours of taking any other medications.  May give with OR without food.) 60 packet 0  . ranitidine (ZANTAC) 75 MG tablet Take 75 mg by mouth daily.    . vitamin C (ASCORBIC ACID) 500 MG tablet Take 500 mg by mouth daily.     . furosemide (LASIX) 20 MG tablet Take 1 tablet by mouth every evening.     . traMADol (ULTRAM) 50 MG tablet Take 1 tablet (50 mg total) by mouth every 6 (six) hours as needed. 30 tablet 0  . hydrocortisone 1 % ointment Apply 1 application topically 2 (two) times daily. Apply to BOTH Lower Extremities Twice a day (Patient not taking: Reported on 12/25/2017) 25 g 0   No facility-administered medications prior to visit.     Allergies  Allergen Reactions  . Procaine Hcl Other (See Comments)    REACTION: pt states reaction to shot at dentist office w/ tachycardia \\T \ SOB (? if really from combination w/epi)    ROS As per  HPI  PE: Blood pressure 128/64, pulse 67, temperature (!) 97.5 F (36.4 C), temperature source Oral, resp. rate 16, SpO2 96 %. Gen: Alert, well appearing.  Patient is oriented to person, place, and general situation. AFFECT: pleasant, lucid thought and speech. Left ankle diffusely TTP and painful with passive eversion and inversion.  Flexion and extension ok. She has 5+ pitting edema from just below her L knee down into entire foot, with some pinkish hue to skin of pretibial region on L.  No excessive warmth.  No streaking.  No erythema.  No weeping of fluid from skin. R LL  with trace pitting in R ankle.   LABS:    Chemistry      Component Value Date/Time   NA 144 12/25/2017 2214   NA 141 09/09/2017   K 4.9 12/25/2017 2214   CL 113 (H) 12/25/2017 2214   CO2 22 12/25/2017 2214   BUN 70 (H) 12/25/2017 2214   BUN 66 (A) 09/09/2017   CREATININE 2.22 (H) 12/25/2017 2214   CREATININE 2.16 (H) 06/08/2015 1505   GLU 105 09/09/2017      Component Value Date/Time   CALCIUM 9.2 12/25/2017 2214   ALKPHOS 81 08/20/2017 1347   AST 18 08/20/2017 1347   ALT 5 (L) 08/20/2017 1347   BILITOT 0.5 08/20/2017 1347       IMPRESSION AND PLAN:  1) Right ankle sprain, stable. Start PT.  Would like to gradually get her back to where she can ambulate with a walker. Continue tramadol 1 tab q6h prn, new rx for #30 done today.  2) Chronic LE venous insufficiency edema, with stage IV CRI: I am trying to keep her edema from getting worse and starting to weep through her skin.  They don't elevate her legs at her ALF, nor does she get a low Na diet. Will increase lasix to 40mg  bid (from 40mg  qAM and 20 mg qPM) at least short term. Recheck BMET at f/u in office in 1 wk.  An After Visit Summary was printed and given to the patient.  FOLLOW UP: Return in about 1 week (around 01/22/2018) for f/u ankle sprain and edema/needs BMET.  Signed:  Crissie Sickles, MD           01/15/2018

## 2018-01-19 DIAGNOSIS — N183 Chronic kidney disease, stage 3 (moderate): Secondary | ICD-10-CM | POA: Diagnosis not present

## 2018-01-19 DIAGNOSIS — I872 Venous insufficiency (chronic) (peripheral): Secondary | ICD-10-CM | POA: Diagnosis not present

## 2018-01-19 DIAGNOSIS — Z8673 Personal history of transient ischemic attack (TIA), and cerebral infarction without residual deficits: Secondary | ICD-10-CM | POA: Diagnosis not present

## 2018-01-19 DIAGNOSIS — Z79899 Other long term (current) drug therapy: Secondary | ICD-10-CM | POA: Diagnosis not present

## 2018-01-19 DIAGNOSIS — L97211 Non-pressure chronic ulcer of right calf limited to breakdown of skin: Secondary | ICD-10-CM | POA: Diagnosis not present

## 2018-01-19 DIAGNOSIS — D510 Vitamin B12 deficiency anemia due to intrinsic factor deficiency: Secondary | ICD-10-CM | POA: Diagnosis not present

## 2018-01-19 DIAGNOSIS — I129 Hypertensive chronic kidney disease with stage 1 through stage 4 chronic kidney disease, or unspecified chronic kidney disease: Secondary | ICD-10-CM | POA: Diagnosis not present

## 2018-01-19 DIAGNOSIS — Z4800 Encounter for change or removal of nonsurgical wound dressing: Secondary | ICD-10-CM | POA: Diagnosis not present

## 2018-01-19 DIAGNOSIS — Z7902 Long term (current) use of antithrombotics/antiplatelets: Secondary | ICD-10-CM | POA: Diagnosis not present

## 2018-01-21 DIAGNOSIS — Z4800 Encounter for change or removal of nonsurgical wound dressing: Secondary | ICD-10-CM | POA: Diagnosis not present

## 2018-01-21 DIAGNOSIS — Z7902 Long term (current) use of antithrombotics/antiplatelets: Secondary | ICD-10-CM | POA: Diagnosis not present

## 2018-01-21 DIAGNOSIS — I129 Hypertensive chronic kidney disease with stage 1 through stage 4 chronic kidney disease, or unspecified chronic kidney disease: Secondary | ICD-10-CM | POA: Diagnosis not present

## 2018-01-21 DIAGNOSIS — I872 Venous insufficiency (chronic) (peripheral): Secondary | ICD-10-CM | POA: Diagnosis not present

## 2018-01-21 DIAGNOSIS — L97211 Non-pressure chronic ulcer of right calf limited to breakdown of skin: Secondary | ICD-10-CM | POA: Diagnosis not present

## 2018-01-21 DIAGNOSIS — D510 Vitamin B12 deficiency anemia due to intrinsic factor deficiency: Secondary | ICD-10-CM | POA: Diagnosis not present

## 2018-01-21 DIAGNOSIS — Z8673 Personal history of transient ischemic attack (TIA), and cerebral infarction without residual deficits: Secondary | ICD-10-CM | POA: Diagnosis not present

## 2018-01-21 DIAGNOSIS — N183 Chronic kidney disease, stage 3 (moderate): Secondary | ICD-10-CM | POA: Diagnosis not present

## 2018-01-21 DIAGNOSIS — Z79899 Other long term (current) drug therapy: Secondary | ICD-10-CM | POA: Diagnosis not present

## 2018-01-22 DIAGNOSIS — L97211 Non-pressure chronic ulcer of right calf limited to breakdown of skin: Secondary | ICD-10-CM | POA: Diagnosis not present

## 2018-01-22 DIAGNOSIS — I129 Hypertensive chronic kidney disease with stage 1 through stage 4 chronic kidney disease, or unspecified chronic kidney disease: Secondary | ICD-10-CM | POA: Diagnosis not present

## 2018-01-22 DIAGNOSIS — Z7902 Long term (current) use of antithrombotics/antiplatelets: Secondary | ICD-10-CM | POA: Diagnosis not present

## 2018-01-22 DIAGNOSIS — N183 Chronic kidney disease, stage 3 (moderate): Secondary | ICD-10-CM | POA: Diagnosis not present

## 2018-01-22 DIAGNOSIS — Z4800 Encounter for change or removal of nonsurgical wound dressing: Secondary | ICD-10-CM | POA: Diagnosis not present

## 2018-01-22 DIAGNOSIS — Z79899 Other long term (current) drug therapy: Secondary | ICD-10-CM | POA: Diagnosis not present

## 2018-01-22 DIAGNOSIS — I872 Venous insufficiency (chronic) (peripheral): Secondary | ICD-10-CM | POA: Diagnosis not present

## 2018-01-22 DIAGNOSIS — D510 Vitamin B12 deficiency anemia due to intrinsic factor deficiency: Secondary | ICD-10-CM | POA: Diagnosis not present

## 2018-01-22 DIAGNOSIS — Z8673 Personal history of transient ischemic attack (TIA), and cerebral infarction without residual deficits: Secondary | ICD-10-CM | POA: Diagnosis not present

## 2018-01-25 DIAGNOSIS — Z7902 Long term (current) use of antithrombotics/antiplatelets: Secondary | ICD-10-CM

## 2018-01-25 DIAGNOSIS — Z79899 Other long term (current) drug therapy: Secondary | ICD-10-CM | POA: Diagnosis not present

## 2018-01-25 DIAGNOSIS — D510 Vitamin B12 deficiency anemia due to intrinsic factor deficiency: Secondary | ICD-10-CM | POA: Diagnosis not present

## 2018-01-25 DIAGNOSIS — N183 Chronic kidney disease, stage 3 (moderate): Secondary | ICD-10-CM

## 2018-01-25 DIAGNOSIS — I872 Venous insufficiency (chronic) (peripheral): Secondary | ICD-10-CM | POA: Diagnosis not present

## 2018-01-25 DIAGNOSIS — I129 Hypertensive chronic kidney disease with stage 1 through stage 4 chronic kidney disease, or unspecified chronic kidney disease: Secondary | ICD-10-CM

## 2018-01-25 DIAGNOSIS — Z8673 Personal history of transient ischemic attack (TIA), and cerebral infarction without residual deficits: Secondary | ICD-10-CM

## 2018-01-25 DIAGNOSIS — L97211 Non-pressure chronic ulcer of right calf limited to breakdown of skin: Secondary | ICD-10-CM | POA: Diagnosis not present

## 2018-01-25 DIAGNOSIS — F0390 Unspecified dementia without behavioral disturbance: Secondary | ICD-10-CM

## 2018-01-25 DIAGNOSIS — Z4800 Encounter for change or removal of nonsurgical wound dressing: Secondary | ICD-10-CM

## 2018-01-26 DIAGNOSIS — I129 Hypertensive chronic kidney disease with stage 1 through stage 4 chronic kidney disease, or unspecified chronic kidney disease: Secondary | ICD-10-CM | POA: Diagnosis not present

## 2018-01-26 DIAGNOSIS — Z8673 Personal history of transient ischemic attack (TIA), and cerebral infarction without residual deficits: Secondary | ICD-10-CM | POA: Diagnosis not present

## 2018-01-26 DIAGNOSIS — R5381 Other malaise: Secondary | ICD-10-CM | POA: Diagnosis not present

## 2018-01-26 DIAGNOSIS — L97211 Non-pressure chronic ulcer of right calf limited to breakdown of skin: Secondary | ICD-10-CM | POA: Diagnosis not present

## 2018-01-26 DIAGNOSIS — I872 Venous insufficiency (chronic) (peripheral): Secondary | ICD-10-CM | POA: Diagnosis not present

## 2018-01-26 DIAGNOSIS — Z7902 Long term (current) use of antithrombotics/antiplatelets: Secondary | ICD-10-CM | POA: Diagnosis not present

## 2018-01-26 DIAGNOSIS — Z79899 Other long term (current) drug therapy: Secondary | ICD-10-CM | POA: Diagnosis not present

## 2018-01-26 DIAGNOSIS — N183 Chronic kidney disease, stage 3 (moderate): Secondary | ICD-10-CM | POA: Diagnosis not present

## 2018-01-26 DIAGNOSIS — Z4800 Encounter for change or removal of nonsurgical wound dressing: Secondary | ICD-10-CM | POA: Diagnosis not present

## 2018-01-26 DIAGNOSIS — D510 Vitamin B12 deficiency anemia due to intrinsic factor deficiency: Secondary | ICD-10-CM | POA: Diagnosis not present

## 2018-01-26 DIAGNOSIS — Z7409 Other reduced mobility: Secondary | ICD-10-CM | POA: Diagnosis not present

## 2018-01-27 ENCOUNTER — Encounter: Payer: Self-pay | Admitting: Family Medicine

## 2018-01-27 ENCOUNTER — Telehealth: Payer: Self-pay | Admitting: *Deleted

## 2018-01-27 ENCOUNTER — Ambulatory Visit (INDEPENDENT_AMBULATORY_CARE_PROVIDER_SITE_OTHER): Payer: Medicare Other | Admitting: Family Medicine

## 2018-01-27 VITALS — BP 140/64 | HR 55 | Temp 97.8°F | Resp 16 | Wt 99.0 lb

## 2018-01-27 DIAGNOSIS — R600 Localized edema: Secondary | ICD-10-CM

## 2018-01-27 DIAGNOSIS — S93402D Sprain of unspecified ligament of left ankle, subsequent encounter: Secondary | ICD-10-CM

## 2018-01-27 DIAGNOSIS — N184 Chronic kidney disease, stage 4 (severe): Secondary | ICD-10-CM

## 2018-01-27 LAB — BASIC METABOLIC PANEL
BUN: 111 mg/dL — AB (ref 6–23)
CHLORIDE: 107 meq/L (ref 96–112)
CO2: 24 mEq/L (ref 19–32)
Calcium: 9.5 mg/dL (ref 8.4–10.5)
Creatinine, Ser: 2.64 mg/dL — ABNORMAL HIGH (ref 0.40–1.20)
GFR: 17.84 mL/min — AB (ref >60.00)
GLUCOSE: 119 mg/dL — AB (ref 70–99)
POTASSIUM: 4.7 meq/L (ref 3.5–5.1)
SODIUM: 140 meq/L (ref 135–145)

## 2018-01-27 NOTE — Progress Notes (Signed)
OFFICE VISIT  01/27/2018   CC:  Chief Complaint  Patient presents with  . Follow-up    ankle sprain and edema   HPI:    Patient is a 84 y.o. Caucasian female with severe dementia who presents for 11 day f/u ankle sprain and LL edema. Last visit I increased her lasix to 40mg  bid to try to avoid weeping from starting in LL. Also recommended PT in her ALF.  She is here unattended today, as usual. Seems to be doing better but still in pain in L ankle, particularly with wt bearing, says she takes tramadol some and does ok.  She is getting PT and is excited b/c today she was able to slide out of her bed w/out assistance, also able to get out of WC and into a seat at the breakfast table.   Says she feels like her LL swelling is improved some. Med list from ALF reviewed today and it is accurate but there is no documentation to indicate if/when any of the meds have been given.  Past Medical History:  Diagnosis Date  . Allergic rhinitis, cause unspecified   . Anemia of chronic kidney failure, stage 4 (severe) (HCC)    Aranesp via nephrologist  . Cataracts, bilateral   . Cerebrovascular disease, unspecified    CDopplers 1/13 showed mild mixed plaque w/ 40-59% (low end) bilat ICA stenoses, the prev right CAE & patch angioplasty were patent  . Choledocholithiasis    12/11/14 CT--nonobstructive (20mm x 6 mm)  . Chronic renal insufficiency, stage IV (severe) (HCC)    Dr. Marval Regal. Not a candidate for renal replacement therapy.  Pt does not want this, either.  . Dementia with behavioral disturbance ([place])    depakote helpful  . Diaphragmatic hernia without mention of obstruction or gangrene   . Essential hypertension   . Hyperkalemia 06/12/2015   diminished renal excretion: as of 05/2016, kayexolate started, low K diet.  Kayexalate changed to veltassa 2019, veltassa increased to 2 packets qd---potassium goal 5.7 or less.  . Impaired mobility    uses walker, wheelchair  . Pernicious anemia    + amemia of CRI  . Pure hypercholesterolemia   . RBBB   . Retinal hemorrhage of right eye   . Unspecified cerebral artery occlusion with cerebral infarction    MRI Brain 12/12 showed large remote right hemispheric infarct w/ encephalomalacia w/ prominent small vessel dis superimposed on atrophy... (residual deficits are diminished sensation on left side, left hand grip weakness,   . Unspecified disorder resulting from impaired renal function    hyperkalemia and anemia  . Unspecified hearing loss   . Unspecified venous (peripheral) insufficiency    lasix  . Unspecified vitamin D deficiency     Past Surgical History:  Procedure Laterality Date  . CAROTID ENDARTERECTOMY    . CHOLECYSTECTOMY    . ENDOSCOPIC RETROGRADE CHOLANGIOPANCREATOGRAPHY (ERCP) WITH PROPOFOL N/A 03/09/2015   Procedure: ENDOSCOPIC RETROGRADE CHOLANGIOPANCREATOGRAPHY (ERCP) WITH PROPOFOL;  Surgeon: Milus Banister, MD;  Location: [facility];  Service: Endoscopy;  Laterality: N/A;  . laser surgery on right eye  12/02/2012   Dr. [facility]aird [facility]    Outpatient Medications Prior to Visit  Medication Sig Dispense Refill  . acetaminophen (TYLENOL) 500 MG tablet Take 1,000 mg by mouth 3 (three) times daily.     Marland Kitchen acetaminophen (TYLENOL) 500 MG tablet Take 500 mg by mouth every 8 (eight) hours as needed for mild pain or moderate pain.    Marland Kitchen  ammonium lactate (AMLACTIN) 12 % cream Apply topically to lower legs at bedtime every night for dry/thick skin 385 g 6  . cetirizine (ZYRTEC) 10 MG tablet Take 10 mg by mouth daily.    . clopidogrel (PLAVIX) 75 MG tablet Take 1 tablet (75 mg total) by mouth daily. RESTART THIS IN 3 DAYS (Patient taking differently: Take 75 mg by mouth daily. ) 30 tablet 3  . cyanocobalamin (,VITAMIN B-12,) 1000 MCG/ML injection Inject 1,000 mcg into the skin every 30 (thirty) days. On the 12th of every month.    . divalproex (DEPAKOTE SPRINKLE) 125 MG capsule 1 tab po qhs (Patient taking  differently: Take 125 mg by mouth at bedtime. ) 30 capsule 0  . fluticasone (CUTIVATE) 0.05 % cream Apply to affected pinkish rash of both lower extremities as needed (Patient taking differently: Apply 1 application topically 2 (two) times daily. For rash on legs) 30 g 1  . furosemide (LASIX) 40 MG tablet Take 1 tablet (40 mg total) by mouth 2 (two) times daily. (Patient taking differently: Take 40 mg by mouth daily. ) 60 tablet 1  . hydrALAZINE (APRESOLINE) 25 MG tablet Take 1 tablet (25 mg total) by mouth 3 (three) times daily. 90 tablet 0  . loperamide (IMODIUM A-D) 2 MG tablet Take 4 mg by mouth daily.     . Multiple Vitamins-Iron (MULTIVITAMINS WITH IRON) TABS tablet Take 1 tablet by mouth daily.    Marland Kitchen oxybutynin (DITROPAN-XL) 5 MG 24 hr tablet Take 5 mg by mouth at bedtime.     . patiromer (VELTASSA) 8.4 g packet 1 packet po bid.  Do not give within 3 hours of taking any other medications.  May give with OR without food. (Patient taking differently: Take 8.4 g by mouth 2 (two) times daily. Do not give within 3 hours of taking any other medications.  May give with OR without food.) 60 packet 0  . ranitidine (ZANTAC) 75 MG tablet Take 75 mg by mouth daily.    . traMADol (ULTRAM) 50 MG tablet Take 1 tablet (50 mg total) by mouth every 6 (six) hours as needed. 30 tablet 0  . vitamin C (ASCORBIC ACID) 500 MG tablet Take 500 mg by mouth daily.     . hydrocortisone 1 % ointment Apply 1 application topically 2 (two) times daily. Apply to BOTH Lower Extremities Twice a day (Patient not taking: Reported on 12/25/2017) 25 g 0   No facility-administered medications prior to visit.     Allergies  Allergen Reactions  . Procaine Hcl Other (See Comments)    REACTION: pt states reaction to shot at dentist office w/ tachycardia \\T \ SOB (? if really from combination w/epi)    ROS As per HPI  PE: Blood pressure 140/64, pulse (!) 55, temperature 97.8 F (36.6 C), temperature source Oral, resp. rate 16,  weight 99 lb (44.9 kg), SpO2 95 %. Gen: Alert, frail- appearing.  Smiling and interactive.  Extremely HOH.  Patient is oriented to person and general situation. L LL with 4+ pitting edema, with some pinkish discoloration of skin of pretibial region, without well demarcated borders and without erythema or warmth.  NO weeping of skin.  She has some diffuse mild TTP around entire L ankle.  She can do active ROM of L knee and L ankle. Marland Kitchen  LABS:    Chemistry      Component Value Date/Time   NA 144 12/25/2017 2214   NA 141 09/09/2017   K 4.9 12/25/2017  2214   CL 113 (H) 12/25/2017 2214   CO2 22 12/25/2017 2214   BUN 70 (H) 12/25/2017 2214   BUN 66 (A) 09/09/2017   CREATININE 2.22 (H) 12/25/2017 2214   CREATININE 2.16 (H) 06/08/2015 1505   GLU 105 09/09/2017      Component Value Date/Time   CALCIUM 9.2 12/25/2017 2214   ALKPHOS 81 08/20/2017 1347   AST 18 08/20/2017 1347   ALT 5 (L) 08/20/2017 1347   BILITOT 0.5 08/20/2017 1347       IMPRESSION AND PLAN:  1) Left ankle sprain, superimposed on chronic L LL marked pitting edema from venous insufficiency. PT helping, tramadol helping, recent increased dose of lasix helping. No changes at this time, but we'll see how her BMET is today.  An After Visit Summary was printed and given to the patient.  FOLLOW UP: Return in about 2 weeks (around 02/10/2018) for f/u L ankle/edema/CRI.   Signed:  Crissie Sickles, MD           01/27/2018

## 2018-01-27 NOTE — Telephone Encounter (Signed)
Hope with [facility] called to report critical lab for pt.  BUN: 111  SW Dr. Raoul Pitch, okay to wait for PCP to review.

## 2018-01-28 ENCOUNTER — Encounter: Payer: Self-pay | Admitting: *Deleted

## 2018-01-28 DIAGNOSIS — Z8673 Personal history of transient ischemic attack (TIA), and cerebral infarction without residual deficits: Secondary | ICD-10-CM | POA: Diagnosis not present

## 2018-01-28 DIAGNOSIS — D510 Vitamin B12 deficiency anemia due to intrinsic factor deficiency: Secondary | ICD-10-CM | POA: Diagnosis not present

## 2018-01-28 DIAGNOSIS — I129 Hypertensive chronic kidney disease with stage 1 through stage 4 chronic kidney disease, or unspecified chronic kidney disease: Secondary | ICD-10-CM | POA: Diagnosis not present

## 2018-01-28 DIAGNOSIS — Z4800 Encounter for change or removal of nonsurgical wound dressing: Secondary | ICD-10-CM | POA: Diagnosis not present

## 2018-01-28 DIAGNOSIS — Z7902 Long term (current) use of antithrombotics/antiplatelets: Secondary | ICD-10-CM | POA: Diagnosis not present

## 2018-01-28 DIAGNOSIS — N183 Chronic kidney disease, stage 3 (moderate): Secondary | ICD-10-CM | POA: Diagnosis not present

## 2018-01-28 DIAGNOSIS — L97211 Non-pressure chronic ulcer of right calf limited to breakdown of skin: Secondary | ICD-10-CM | POA: Diagnosis not present

## 2018-01-28 DIAGNOSIS — Z79899 Other long term (current) drug therapy: Secondary | ICD-10-CM | POA: Diagnosis not present

## 2018-01-28 DIAGNOSIS — I872 Venous insufficiency (chronic) (peripheral): Secondary | ICD-10-CM | POA: Diagnosis not present

## 2018-01-28 NOTE — Telephone Encounter (Signed)
Reviewed.See result note. 

## 2018-01-29 DIAGNOSIS — Z79899 Other long term (current) drug therapy: Secondary | ICD-10-CM | POA: Diagnosis not present

## 2018-01-29 DIAGNOSIS — I129 Hypertensive chronic kidney disease with stage 1 through stage 4 chronic kidney disease, or unspecified chronic kidney disease: Secondary | ICD-10-CM | POA: Diagnosis not present

## 2018-01-29 DIAGNOSIS — Z7902 Long term (current) use of antithrombotics/antiplatelets: Secondary | ICD-10-CM | POA: Diagnosis not present

## 2018-01-29 DIAGNOSIS — L97211 Non-pressure chronic ulcer of right calf limited to breakdown of skin: Secondary | ICD-10-CM | POA: Diagnosis not present

## 2018-01-29 DIAGNOSIS — D510 Vitamin B12 deficiency anemia due to intrinsic factor deficiency: Secondary | ICD-10-CM | POA: Diagnosis not present

## 2018-01-29 DIAGNOSIS — Z8673 Personal history of transient ischemic attack (TIA), and cerebral infarction without residual deficits: Secondary | ICD-10-CM | POA: Diagnosis not present

## 2018-01-29 DIAGNOSIS — I872 Venous insufficiency (chronic) (peripheral): Secondary | ICD-10-CM | POA: Diagnosis not present

## 2018-01-29 DIAGNOSIS — N183 Chronic kidney disease, stage 3 (moderate): Secondary | ICD-10-CM | POA: Diagnosis not present

## 2018-01-29 DIAGNOSIS — Z4800 Encounter for change or removal of nonsurgical wound dressing: Secondary | ICD-10-CM | POA: Diagnosis not present

## 2018-02-03 ENCOUNTER — Encounter (HOSPITAL_COMMUNITY): Payer: Medicare Other

## 2018-02-03 DIAGNOSIS — N183 Chronic kidney disease, stage 3 (moderate): Secondary | ICD-10-CM | POA: Diagnosis not present

## 2018-02-03 DIAGNOSIS — Z8673 Personal history of transient ischemic attack (TIA), and cerebral infarction without residual deficits: Secondary | ICD-10-CM | POA: Diagnosis not present

## 2018-02-03 DIAGNOSIS — Z79899 Other long term (current) drug therapy: Secondary | ICD-10-CM | POA: Diagnosis not present

## 2018-02-03 DIAGNOSIS — Z4800 Encounter for change or removal of nonsurgical wound dressing: Secondary | ICD-10-CM | POA: Diagnosis not present

## 2018-02-03 DIAGNOSIS — D510 Vitamin B12 deficiency anemia due to intrinsic factor deficiency: Secondary | ICD-10-CM | POA: Diagnosis not present

## 2018-02-03 DIAGNOSIS — Z7902 Long term (current) use of antithrombotics/antiplatelets: Secondary | ICD-10-CM | POA: Diagnosis not present

## 2018-02-03 DIAGNOSIS — I872 Venous insufficiency (chronic) (peripheral): Secondary | ICD-10-CM | POA: Diagnosis not present

## 2018-02-03 DIAGNOSIS — L97211 Non-pressure chronic ulcer of right calf limited to breakdown of skin: Secondary | ICD-10-CM | POA: Diagnosis not present

## 2018-02-03 DIAGNOSIS — I129 Hypertensive chronic kidney disease with stage 1 through stage 4 chronic kidney disease, or unspecified chronic kidney disease: Secondary | ICD-10-CM | POA: Diagnosis not present

## 2018-02-05 ENCOUNTER — Ambulatory Visit (INDEPENDENT_AMBULATORY_CARE_PROVIDER_SITE_OTHER): Payer: Medicare Other | Admitting: Family Medicine

## 2018-02-05 ENCOUNTER — Ambulatory Visit: Payer: Medicare Other | Admitting: Family Medicine

## 2018-02-05 ENCOUNTER — Encounter: Payer: Self-pay | Admitting: Family Medicine

## 2018-02-05 VITALS — BP 106/65 | HR 60 | Temp 97.4°F | Resp 20 | Ht 67.0 in | Wt 96.4 lb

## 2018-02-05 DIAGNOSIS — Z79899 Other long term (current) drug therapy: Secondary | ICD-10-CM | POA: Diagnosis not present

## 2018-02-05 DIAGNOSIS — R110 Nausea: Secondary | ICD-10-CM | POA: Diagnosis not present

## 2018-02-05 DIAGNOSIS — Z4800 Encounter for change or removal of nonsurgical wound dressing: Secondary | ICD-10-CM | POA: Diagnosis not present

## 2018-02-05 DIAGNOSIS — Z7902 Long term (current) use of antithrombotics/antiplatelets: Secondary | ICD-10-CM | POA: Diagnosis not present

## 2018-02-05 DIAGNOSIS — N184 Chronic kidney disease, stage 4 (severe): Secondary | ICD-10-CM | POA: Diagnosis not present

## 2018-02-05 DIAGNOSIS — N2889 Other specified disorders of kidney and ureter: Secondary | ICD-10-CM

## 2018-02-05 DIAGNOSIS — I872 Venous insufficiency (chronic) (peripheral): Secondary | ICD-10-CM | POA: Diagnosis not present

## 2018-02-05 DIAGNOSIS — Z8673 Personal history of transient ischemic attack (TIA), and cerebral infarction without residual deficits: Secondary | ICD-10-CM | POA: Diagnosis not present

## 2018-02-05 DIAGNOSIS — E875 Hyperkalemia: Secondary | ICD-10-CM

## 2018-02-05 DIAGNOSIS — R630 Anorexia: Secondary | ICD-10-CM

## 2018-02-05 DIAGNOSIS — D510 Vitamin B12 deficiency anemia due to intrinsic factor deficiency: Secondary | ICD-10-CM | POA: Diagnosis not present

## 2018-02-05 DIAGNOSIS — S93402D Sprain of unspecified ligament of left ankle, subsequent encounter: Secondary | ICD-10-CM

## 2018-02-05 DIAGNOSIS — I10 Essential (primary) hypertension: Secondary | ICD-10-CM

## 2018-02-05 DIAGNOSIS — I129 Hypertensive chronic kidney disease with stage 1 through stage 4 chronic kidney disease, or unspecified chronic kidney disease: Secondary | ICD-10-CM | POA: Diagnosis not present

## 2018-02-05 DIAGNOSIS — N183 Chronic kidney disease, stage 3 (moderate): Secondary | ICD-10-CM | POA: Diagnosis not present

## 2018-02-05 DIAGNOSIS — L97211 Non-pressure chronic ulcer of right calf limited to breakdown of skin: Secondary | ICD-10-CM | POA: Diagnosis not present

## 2018-02-05 NOTE — Progress Notes (Signed)
OFFICE VISIT  02/05/2018   CC:  Chief Complaint  Patient presents with  . Follow-up    HTN,Edema,Chronic conditions   HPI:    Patient is a 84 y.o. Caucasian female with advanced dementia who presents for f/u HTN, LL venous insufficiency edema, CRI stage IV with hyperkalemia, L ankle sprain . I last saw her 10 d/a.  She has sprained her ankle recently and has been getting some PT and tramadol prn.  Last visit she had been showing some signs of improvement and her renal function and potassium were stable.  She continues to improve regarding sprain--she is happy to tell me she has walked with her walker recently and got up OOB and went to bathroom unassisted! Feeling nauseated all the time.  She has lost some wt.  Current lasix dosing is 40mg  qAM and 20mg  qpm.  Reviewed bp's from ALF--very good.   Past Medical History:  Diagnosis Date  . Allergic rhinitis, cause unspecified   . Anemia of chronic kidney failure, stage 4 (severe) (HCC)    Aranesp via nephrologist  . Cataracts, bilateral   . Cerebrovascular disease, unspecified    CDopplers 1/13 showed mild mixed plaque w/ 40-59% (low end) bilat ICA stenoses, the prev right CAE & patch angioplasty were patent  . Choledocholithiasis    12/11/14 CT--nonobstructive (35mm x 6 mm)  . Chronic renal insufficiency, stage IV (severe) (HCC)    Dr. Marval Regal. Not a candidate for renal replacement therapy.  Pt does not want this, either.  . Dementia with behavioral disturbance ([place])    depakote helpful  . Diaphragmatic hernia without mention of obstruction or gangrene   . Essential hypertension   . Hyperkalemia 06/12/2015   diminished renal excretion: as of 05/2016, kayexolate started, low K diet.  Kayexalate changed to veltassa 2019, veltassa increased to 2 packets qd---potassium goal 5.7 or less.  . Impaired mobility    uses walker, wheelchair  . Pernicious anemia    + amemia of CRI  . Pure hypercholesterolemia   . RBBB   . Retinal  hemorrhage of right eye   . Unspecified cerebral artery occlusion with cerebral infarction    MRI Brain 12/12 showed large remote right hemispheric infarct w/ encephalomalacia w/ prominent small vessel dis superimposed on atrophy... (residual deficits are diminished sensation on left side, left hand grip weakness,   . Unspecified disorder resulting from impaired renal function    hyperkalemia and anemia  . Unspecified hearing loss   . Unspecified venous (peripheral) insufficiency    lasix  . Unspecified vitamin D deficiency     Past Surgical History:  Procedure Laterality Date  . CAROTID ENDARTERECTOMY    . CHOLECYSTECTOMY    . ENDOSCOPIC RETROGRADE CHOLANGIOPANCREATOGRAPHY (ERCP) WITH PROPOFOL N/A 03/09/2015   Procedure: ENDOSCOPIC RETROGRADE CHOLANGIOPANCREATOGRAPHY (ERCP) WITH PROPOFOL;  Surgeon: Milus Banister, MD;  Location: [facility];  Service: Endoscopy;  Laterality: N/A;  . laser surgery on right eye  12/02/2012   Dr. [facility]aird [facility]    Outpatient Medications Prior to Visit  Medication Sig Dispense Refill  . acetaminophen (TYLENOL) 500 MG tablet Take 1,000 mg by mouth 3 (three) times daily.     Marland Kitchen ammonium lactate (AMLACTIN) 12 % cream Apply topically to lower legs at bedtime every night for dry/thick skin 385 g 6  . cetirizine (ZYRTEC) 10 MG tablet Take 10 mg by mouth daily.    . clopidogrel (PLAVIX) 75 MG tablet Take 1 tablet (75 mg total) by mouth daily.  RESTART THIS IN 3 DAYS (Patient taking differently: Take 75 mg by mouth daily. ) 30 tablet 3  . cyanocobalamin (,VITAMIN B-12,) 1000 MCG/ML injection Inject 1,000 mcg into the skin every 30 (thirty) days. On the 12th of every month.    . divalproex (DEPAKOTE SPRINKLE) 125 MG capsule 1 tab po qhs (Patient taking differently: Take 125 mg by mouth at bedtime. ) 30 capsule 0  . fluticasone (CUTIVATE) 0.05 % cream Apply to affected pinkish rash of both lower extremities as needed (Patient taking differently: Apply 1  application topically 2 (two) times daily. For rash on legs) 30 g 1  . furosemide (LASIX) 40 MG tablet Take 1 tablet (40 mg total) by mouth 2 (two) times daily. (Patient taking differently: Take 40 mg by mouth daily. ) 60 tablet 1  . hydrALAZINE (APRESOLINE) 25 MG tablet Take 1 tablet (25 mg total) by mouth 3 (three) times daily. 90 tablet 0  . hydrocortisone 1 % ointment Apply 1 application topically 2 (two) times daily. Apply to BOTH Lower Extremities Twice a day 25 g 0  . loperamide (IMODIUM A-D) 2 MG tablet Take 4 mg by mouth daily.     . Multiple Vitamins-Iron (MULTIVITAMINS WITH IRON) TABS tablet Take 1 tablet by mouth daily.    Marland Kitchen oxybutynin (DITROPAN-XL) 5 MG 24 hr tablet Take 5 mg by mouth at bedtime.     . patiromer (VELTASSA) 8.4 g packet 1 packet po bid.  Do not give within 3 hours of taking any other medications.  May give with OR without food. (Patient taking differently: Take 8.4 g by mouth 2 (two) times daily. Do not give within 3 hours of taking any other medications.  May give with OR without food.) 60 packet 0  . ranitidine (ZANTAC) 75 MG tablet Take 75 mg by mouth daily.    . traMADol (ULTRAM) 50 MG tablet Take 1 tablet (50 mg total) by mouth every 6 (six) hours as needed. 30 tablet 0  . vitamin C (ASCORBIC ACID) 500 MG tablet Take 500 mg by mouth daily.     Marland Kitchen acetaminophen (TYLENOL) 500 MG tablet Take 500 mg by mouth every 8 (eight) hours as needed for mild pain or moderate pain.     No facility-administered medications prior to visit.     Allergies  Allergen Reactions  . Procaine Hcl Other (See Comments)    REACTION: pt states reaction to shot at dentist office w/ tachycardia \\T \ SOB (? if really from combination w/epi)    ROS As per HPI  PE: Blood pressure 106/65, pulse 60, temperature (!) 97.4 F (36.3 C), resp. rate 20, height 5\' 7"  (1.702 m), weight 96 lb 6.4 oz (43.7 kg), SpO2 100 %. Gen: Alert, well appearing.  Patient is oriented to person and general  situation. AFFECT: pleasantly demented. CV: RRR, 2/6 syst murmur LUNGS: CTA bilat, nonlabored. LEGS: L lower leg with 4+ pitting edema, no pitting.  Minimal TTP of ankle.  Ankle passive ROM intact.   Pinkish hue to L leg pretibial region and skin of anterior ankle---c/w her usual appearance of moderate venous stasis dermatitis.   LABS:   Lab Results  Component Value Date   WBC 7.2 12/25/2017   HGB 10.9 (L) 12/25/2017   HCT 36.0 12/25/2017   MCV 105.0 (H) 12/25/2017   PLT 215 12/25/2017      Chemistry      Component Value Date/Time   NA 140 01/27/2018 1131   NA 141 09/09/2017  K 4.7 01/27/2018 1131   CL 107 01/27/2018 1131   CO2 24 01/27/2018 1131   BUN 111 (HH) 01/27/2018 1131   BUN 66 (A) 09/09/2017   CREATININE 2.64 (H) 01/27/2018 1131   CREATININE 2.16 (H) 06/08/2015 1505   GLU 105 09/09/2017      Component Value Date/Time   CALCIUM 9.5 01/27/2018 1131   ALKPHOS 81 08/20/2017 1347   AST 18 08/20/2017 1347   ALT 5 (L) 08/20/2017 1347   BILITOT 0.5 08/20/2017 1347       IMPRESSION AND PLAN:  1) L ankle sprain: improving. Continue tramadol q6h prn. She is getting PT and progressing: goal is to get her back to ambulating with her walker consistently.  2) Hyperkalemia in the context of CRI stage 4: recheck BMET today and if K is <5 will decrease lasix back to 40mg  qd.  3) Nausea: likely due to CRI.  4) HTN: The current medical regimen is effective;  continue present plan and medications.  5) L>>>R LL chronic venous insufficiency edema and chronic venous stasis dermatitis: slightly improved edema. See #2 above.  An After Visit Summary was printed and given to the patient.  FOLLOW UP: 3 mo RCI  Signed:  Crissie Sickles, MD           02/05/2018

## 2018-02-06 ENCOUNTER — Encounter: Payer: Self-pay | Admitting: *Deleted

## 2018-02-06 LAB — COMPREHENSIVE METABOLIC PANEL
AG RATIO: 1.4 (calc) (ref 1.0–2.5)
ALBUMIN MSPROF: 3.4 g/dL — AB (ref 3.6–5.1)
ALT: 9 U/L (ref 6–29)
AST: 12 U/L (ref 10–35)
Alkaline phosphatase (APISO): 98 U/L (ref 33–130)
BUN / CREAT RATIO: 40 (calc) — AB (ref 6–22)
BUN: 106 mg/dL — ABNORMAL HIGH (ref 7–25)
CHLORIDE: 105 mmol/L (ref 98–110)
CO2: 20 mmol/L (ref 20–32)
CREATININE: 2.67 mg/dL — AB (ref 0.60–0.88)
Calcium: 9.2 mg/dL (ref 8.6–10.4)
GLOBULIN: 2.4 g/dL (ref 1.9–3.7)
Glucose, Bld: 121 mg/dL — ABNORMAL HIGH (ref 65–99)
POTASSIUM: 3.4 mmol/L — AB (ref 3.5–5.3)
Sodium: 138 mmol/L (ref 135–146)
Total Bilirubin: 0.3 mg/dL (ref 0.2–1.2)
Total Protein: 5.8 g/dL — ABNORMAL LOW (ref 6.1–8.1)

## 2018-02-10 DIAGNOSIS — D510 Vitamin B12 deficiency anemia due to intrinsic factor deficiency: Secondary | ICD-10-CM | POA: Diagnosis not present

## 2018-02-10 DIAGNOSIS — L97211 Non-pressure chronic ulcer of right calf limited to breakdown of skin: Secondary | ICD-10-CM | POA: Diagnosis not present

## 2018-02-10 DIAGNOSIS — Z4800 Encounter for change or removal of nonsurgical wound dressing: Secondary | ICD-10-CM | POA: Diagnosis not present

## 2018-02-10 DIAGNOSIS — Z7902 Long term (current) use of antithrombotics/antiplatelets: Secondary | ICD-10-CM | POA: Diagnosis not present

## 2018-02-10 DIAGNOSIS — Z8673 Personal history of transient ischemic attack (TIA), and cerebral infarction without residual deficits: Secondary | ICD-10-CM | POA: Diagnosis not present

## 2018-02-10 DIAGNOSIS — Z79899 Other long term (current) drug therapy: Secondary | ICD-10-CM | POA: Diagnosis not present

## 2018-02-10 DIAGNOSIS — N183 Chronic kidney disease, stage 3 (moderate): Secondary | ICD-10-CM | POA: Diagnosis not present

## 2018-02-10 DIAGNOSIS — I129 Hypertensive chronic kidney disease with stage 1 through stage 4 chronic kidney disease, or unspecified chronic kidney disease: Secondary | ICD-10-CM | POA: Diagnosis not present

## 2018-02-10 DIAGNOSIS — I872 Venous insufficiency (chronic) (peripheral): Secondary | ICD-10-CM | POA: Diagnosis not present

## 2018-02-12 ENCOUNTER — Observation Stay (HOSPITAL_COMMUNITY)
Admission: EM | Admit: 2018-02-12 | Discharge: 2018-02-14 | Disposition: A | Discharge status: Skilled Nursing Facility | Payer: Medicare Other | Attending: Family Medicine | Admitting: Family Medicine

## 2018-02-12 ENCOUNTER — Ambulatory Visit: Payer: Self-pay

## 2018-02-12 ENCOUNTER — Encounter: Payer: Self-pay | Admitting: *Deleted

## 2018-02-12 ENCOUNTER — Emergency Department (HOSPITAL_COMMUNITY): Payer: Medicare Other

## 2018-02-12 ENCOUNTER — Encounter (HOSPITAL_COMMUNITY): Payer: Self-pay | Admitting: Emergency Medicine

## 2018-02-12 DIAGNOSIS — R110 Nausea: Secondary | ICD-10-CM

## 2018-02-12 DIAGNOSIS — E875 Hyperkalemia: Secondary | ICD-10-CM | POA: Diagnosis not present

## 2018-02-12 DIAGNOSIS — L97211 Non-pressure chronic ulcer of right calf limited to breakdown of skin: Secondary | ICD-10-CM | POA: Diagnosis not present

## 2018-02-12 DIAGNOSIS — E876 Hypokalemia: Secondary | ICD-10-CM | POA: Diagnosis not present

## 2018-02-12 DIAGNOSIS — I129 Hypertensive chronic kidney disease with stage 1 through stage 4 chronic kidney disease, or unspecified chronic kidney disease: Secondary | ICD-10-CM | POA: Diagnosis not present

## 2018-02-12 DIAGNOSIS — S82302A Unspecified fracture of lower end of left tibia, initial encounter for closed fracture: Secondary | ICD-10-CM

## 2018-02-12 DIAGNOSIS — R2689 Other abnormalities of gait and mobility: Secondary | ICD-10-CM | POA: Diagnosis not present

## 2018-02-12 DIAGNOSIS — D649 Anemia, unspecified: Secondary | ICD-10-CM

## 2018-02-12 DIAGNOSIS — K219 Gastro-esophageal reflux disease without esophagitis: Secondary | ICD-10-CM | POA: Diagnosis not present

## 2018-02-12 DIAGNOSIS — N184 Chronic kidney disease, stage 4 (severe): Secondary | ICD-10-CM | POA: Diagnosis present

## 2018-02-12 DIAGNOSIS — R1310 Dysphagia, unspecified: Secondary | ICD-10-CM | POA: Diagnosis not present

## 2018-02-12 DIAGNOSIS — N185 Chronic kidney disease, stage 5: Secondary | ICD-10-CM | POA: Diagnosis not present

## 2018-02-12 DIAGNOSIS — Z79899 Other long term (current) drug therapy: Secondary | ICD-10-CM | POA: Insufficient documentation

## 2018-02-12 DIAGNOSIS — Z8673 Personal history of transient ischemic attack (TIA), and cerebral infarction without residual deficits: Secondary | ICD-10-CM | POA: Diagnosis not present

## 2018-02-12 DIAGNOSIS — E559 Vitamin D deficiency, unspecified: Secondary | ICD-10-CM | POA: Diagnosis not present

## 2018-02-12 DIAGNOSIS — N179 Acute kidney failure, unspecified: Secondary | ICD-10-CM | POA: Diagnosis not present

## 2018-02-12 DIAGNOSIS — I872 Venous insufficiency (chronic) (peripheral): Secondary | ICD-10-CM | POA: Diagnosis not present

## 2018-02-12 DIAGNOSIS — Z4800 Encounter for change or removal of nonsurgical wound dressing: Secondary | ICD-10-CM | POA: Diagnosis not present

## 2018-02-12 DIAGNOSIS — I959 Hypotension, unspecified: Secondary | ICD-10-CM | POA: Diagnosis not present

## 2018-02-12 DIAGNOSIS — F0391 Unspecified dementia with behavioral disturbance: Secondary | ICD-10-CM | POA: Insufficient documentation

## 2018-02-12 DIAGNOSIS — D510 Vitamin B12 deficiency anemia due to intrinsic factor deficiency: Secondary | ICD-10-CM | POA: Diagnosis not present

## 2018-02-12 DIAGNOSIS — E7800 Pure hypercholesterolemia, unspecified: Secondary | ICD-10-CM | POA: Diagnosis not present

## 2018-02-12 DIAGNOSIS — I700 Atherosclerosis of aorta: Secondary | ICD-10-CM | POA: Insufficient documentation

## 2018-02-12 DIAGNOSIS — D631 Anemia in chronic kidney disease: Secondary | ICD-10-CM | POA: Diagnosis not present

## 2018-02-12 DIAGNOSIS — I4510 Unspecified right bundle-branch block: Secondary | ICD-10-CM | POA: Diagnosis not present

## 2018-02-12 DIAGNOSIS — I120 Hypertensive chronic kidney disease with stage 5 chronic kidney disease or end stage renal disease: Secondary | ICD-10-CM | POA: Diagnosis not present

## 2018-02-12 DIAGNOSIS — Z7902 Long term (current) use of antithrombotics/antiplatelets: Secondary | ICD-10-CM | POA: Diagnosis not present

## 2018-02-12 DIAGNOSIS — Z66 Do not resuscitate: Secondary | ICD-10-CM | POA: Diagnosis not present

## 2018-02-12 DIAGNOSIS — G40909 Epilepsy, unspecified, not intractable, without status epilepticus: Secondary | ICD-10-CM | POA: Diagnosis not present

## 2018-02-12 DIAGNOSIS — K5900 Constipation, unspecified: Secondary | ICD-10-CM | POA: Diagnosis not present

## 2018-02-12 DIAGNOSIS — R627 Adult failure to thrive: Secondary | ICD-10-CM

## 2018-02-12 DIAGNOSIS — R197 Diarrhea, unspecified: Secondary | ICD-10-CM

## 2018-02-12 DIAGNOSIS — R609 Edema, unspecified: Secondary | ICD-10-CM | POA: Diagnosis not present

## 2018-02-12 DIAGNOSIS — R1111 Vomiting without nausea: Secondary | ICD-10-CM | POA: Diagnosis not present

## 2018-02-12 DIAGNOSIS — I10 Essential (primary) hypertension: Secondary | ICD-10-CM

## 2018-02-12 DIAGNOSIS — S82209A Unspecified fracture of shaft of unspecified tibia, initial encounter for closed fracture: Secondary | ICD-10-CM

## 2018-02-12 DIAGNOSIS — R112 Nausea with vomiting, unspecified: Secondary | ICD-10-CM | POA: Diagnosis not present

## 2018-02-12 DIAGNOSIS — S82245A Nondisplaced spiral fracture of shaft of left tibia, initial encounter for closed fracture: Secondary | ICD-10-CM | POA: Diagnosis not present

## 2018-02-12 DIAGNOSIS — N183 Chronic kidney disease, stage 3 (moderate): Secondary | ICD-10-CM | POA: Diagnosis not present

## 2018-02-12 DIAGNOSIS — R5381 Other malaise: Secondary | ICD-10-CM

## 2018-02-12 DIAGNOSIS — R630 Anorexia: Secondary | ICD-10-CM | POA: Insufficient documentation

## 2018-02-12 DIAGNOSIS — R52 Pain, unspecified: Secondary | ICD-10-CM | POA: Diagnosis not present

## 2018-02-12 LAB — URINALYSIS, ROUTINE W REFLEX MICROSCOPIC
Bilirubin Urine: NEGATIVE
GLUCOSE, UA: NEGATIVE mg/dL
HGB URINE DIPSTICK: NEGATIVE
Ketones, ur: 5 mg/dL — AB
NITRITE: NEGATIVE
PROTEIN: NEGATIVE mg/dL
Specific Gravity, Urine: 1.012 (ref 1.005–1.030)
pH: 5 (ref 5.0–8.0)

## 2018-02-12 LAB — COMPREHENSIVE METABOLIC PANEL
ALK PHOS: 98 U/L (ref 38–126)
ALT: 12 U/L (ref 0–44)
ANION GAP: 15 (ref 5–15)
AST: 13 U/L — ABNORMAL LOW (ref 15–41)
Albumin: 2.8 g/dL — ABNORMAL LOW (ref 3.5–5.0)
BUN: 117 mg/dL — ABNORMAL HIGH (ref 8–23)
CALCIUM: 9.3 mg/dL (ref 8.9–10.3)
CO2: 19 mmol/L — ABNORMAL LOW (ref 22–32)
Chloride: 106 mmol/L (ref 98–111)
Creatinine, Ser: 3.7 mg/dL — ABNORMAL HIGH (ref 0.44–1.00)
GFR, EST AFRICAN AMERICAN: 11 mL/min — AB (ref >60)
GFR, EST NON AFRICAN AMERICAN: 10 mL/min — AB (ref >60)
Glucose, Bld: 98 mg/dL (ref 70–99)
Potassium: 3.2 mmol/L — ABNORMAL LOW (ref 3.5–5.1)
Sodium: 140 mmol/L (ref 135–145)
TOTAL PROTEIN: 5.7 g/dL — AB (ref 6.5–8.1)
Total Bilirubin: 0.9 mg/dL (ref 0.3–1.2)

## 2018-02-12 LAB — CBC WITH DIFFERENTIAL/PLATELET
Abs Immature Granulocytes: 0.03 10*3/uL (ref 0.00–0.07)
BASOS ABS: 0 10*3/uL (ref 0.0–0.1)
Basophils Relative: 1 %
EOS ABS: 0 10*3/uL (ref 0.0–0.5)
Eosinophils Relative: 0 %
HCT: 30.7 % — ABNORMAL LOW (ref 36.0–46.0)
HEMOGLOBIN: 9.5 g/dL — AB (ref 12.0–15.0)
IMMATURE GRANULOCYTES: 0 %
LYMPHS ABS: 0.7 10*3/uL (ref 0.7–4.0)
LYMPHS PCT: 9 %
MCH: 30.6 pg (ref 26.0–34.0)
MCHC: 30.9 g/dL (ref 30.0–36.0)
MCV: 99 fL (ref 80.0–100.0)
Monocytes Absolute: 0.7 10*3/uL (ref 0.1–1.0)
Monocytes Relative: 8 %
NEUTROS ABS: 6.4 10*3/uL (ref 1.7–7.7)
NEUTROS PCT: 82 %
NRBC: 0 % (ref 0.0–0.2)
Platelets: 197 10*3/uL (ref 150–400)
RBC: 3.1 MIL/uL — ABNORMAL LOW (ref 3.87–5.11)
RDW: 16.3 % — AB (ref 11.5–15.5)
WBC: 7.8 10*3/uL (ref 4.0–10.5)

## 2018-02-12 MED ORDER — TAB-A-VITE/IRON PO TABS
1.0000 | ORAL_TABLET | Freq: Every day | ORAL | Status: DC
  Administered 2018-02-13 – 2018-02-14 (×2): 1 via ORAL
  Filled 2018-02-12 (×2): qty 1

## 2018-02-12 MED ORDER — POTASSIUM CHLORIDE 10 MEQ/100ML IV SOLN
10.0000 meq | INTRAVENOUS | Status: AC
  Administered 2018-02-12 – 2018-02-13 (×4): 10 meq via INTRAVENOUS
  Filled 2018-02-12 (×5): qty 100

## 2018-02-12 MED ORDER — ACETAMINOPHEN 325 MG PO TABS
650.0000 mg | ORAL_TABLET | Freq: Four times a day (QID) | ORAL | Status: DC | PRN
  Administered 2018-02-13: 650 mg via ORAL
  Filled 2018-02-12 (×2): qty 2

## 2018-02-12 MED ORDER — SODIUM CHLORIDE 0.9 % IV BOLUS
1000.0000 mL | Freq: Once | INTRAVENOUS | Status: AC
  Administered 2018-02-12: 1000 mL via INTRAVENOUS

## 2018-02-12 MED ORDER — CLOPIDOGREL BISULFATE 75 MG PO TABS: 75.0000 mg | ORAL_TABLET | Freq: Every day | ORAL | Status: DC

## 2018-02-12 MED ORDER — PATIROMER SORBITEX CALCIUM 8.4 G PO PACK: 8.4000 g | PACK | Freq: Two times a day (BID) | ORAL | Status: DC

## 2018-02-12 MED ORDER — DIVALPROEX SODIUM 125 MG PO CSDR: 125.0000 mg | DELAYED_RELEASE_CAPSULE | Freq: Every day | ORAL | Status: DC

## 2018-02-12 MED ORDER — TRAMADOL HCL 50 MG PO TABS
50.0000 mg | ORAL_TABLET | Freq: Four times a day (QID) | ORAL | Status: DC | PRN
  Filled 2018-02-12: qty 1

## 2018-02-12 MED ORDER — HEPARIN SODIUM (PORCINE) 5000 UNIT/ML IJ SOLN
5000.0000 [IU] | Freq: Three times a day (TID) | INTRAMUSCULAR | Status: DC
  Administered 2018-02-12 – 2018-02-14 (×5): 5000 [IU] via SUBCUTANEOUS
  Filled 2018-02-12 (×5): qty 1

## 2018-02-12 MED ORDER — VALPROATE SODIUM 500 MG/5ML IV SOLN
125.0000 mg | Freq: Every day | INTRAVENOUS | Status: DC
  Administered 2018-02-13: 125 mg via INTRAVENOUS
  Filled 2018-02-12 (×2): qty 1.25

## 2018-02-12 MED ORDER — OXYBUTYNIN CHLORIDE ER 5 MG PO TB24
5.0000 mg | ORAL_TABLET | Freq: Every day | ORAL | Status: DC
  Administered 2018-02-13: 5 mg via ORAL
  Filled 2018-02-12 (×4): qty 1

## 2018-02-12 MED ORDER — LORATADINE 10 MG PO TABS
10.0000 mg | ORAL_TABLET | Freq: Every day | ORAL | Status: DC
  Administered 2018-02-13 – 2018-02-14 (×2): 10 mg via ORAL
  Filled 2018-02-12 (×2): qty 1

## 2018-02-12 MED ORDER — FAMOTIDINE 20 MG PO TABS
20.0000 mg | ORAL_TABLET | Freq: Every day | ORAL | Status: DC
  Administered 2018-02-13: 20 mg via ORAL
  Filled 2018-02-12: qty 1

## 2018-02-12 MED ORDER — VITAMIN C 500 MG PO TABS
500.0000 mg | ORAL_TABLET | Freq: Every day | ORAL | Status: DC
  Administered 2018-02-13 – 2018-02-14 (×2): 500 mg via ORAL
  Filled 2018-02-12 (×2): qty 1

## 2018-02-12 MED ORDER — ONDANSETRON HCL 4 MG/2ML IJ SOLN: 4.0000 mg | Freq: Four times a day (QID) | INTRAMUSCULAR | Status: DC | PRN

## 2018-02-12 MED ORDER — DEXTROSE-NACL 5-0.9 % IV SOLN
INTRAVENOUS | Status: AC
  Administered 2018-02-12: 100 mL/h via INTRAVENOUS

## 2018-02-12 NOTE — Telephone Encounter (Signed)
SW Dr. Anitra Lauth, we will send order to d/c hydralazine (BP med), check BP & HR BID and call back on Monday with readings. If BP <90/50 or HR <50 take pt to ER.   Orders have been printed, signed and faxed to [facility].  SW [place] at [facility] to confirm they received orders, orders were received.

## 2018-02-12 NOTE — ED Notes (Signed)
Pt hearing aids placed in cup and at bedside.

## 2018-02-12 NOTE — Telephone Encounter (Signed)
Physical therapist working with Carrie Grimes called with concerns that patient is weak and BP 102/56 HR 71. Per Carrie Grimes ok to speak with Carrie Grimes PT assistant. Carrie Grimes expressed concern because pt is weak and reports diarrhea and nausea for over a week.  Per protocol a call place to Dr Anitra Lauth at office to try to find pt appointment. Per Carrie Grimes at office Dr Anitra Lauth will send orders to [facility]. Carrie Grimes will notify Carrie Grimes. Reason for Disposition . [9] Systolic BP 01-222 AND [4] taking blood pressure medications AND [3] dizzy, lightheaded or weak  Answer Assessment - Initial Assessment Questions 1. BLOOD PRESSURE: "What is the blood pressure?" "Did you take at least two measurements 5 minutes apart?"     102/56  2. ONSET: "When did you take your blood pressure?"     10 minutes 3. HOW: "How did you obtain the blood pressure?" (e.g., visiting nurse, automatic home BP monitor)     Physical therapist assistant 4. HISTORY: "Do you have a history of low blood pressure?" "What is your blood pressure normally?"     no 5. MEDICATIONS: "Are you taking any medications for blood pressure?" If yes: "Have they been changed recently?"     no 6. PULSE RATE: "Do you know what your pulse rate is?"      71 7. OTHER SYMPTOMS: "Have you been sick recently?" "Have you had a recent injury?"     Weakness diarrhea nausea for over a weak 8. PREGNANCY: "Is there any chance you are pregnant?" "When was your last menstrual period?"     no  Protocols used: LOW BLOOD PRESSURE-A-AH

## 2018-02-12 NOTE — ED Notes (Signed)
Ortho tech paged  

## 2018-02-12 NOTE — Progress Notes (Signed)
Orthopedic Tech Progress Note Patient Details:  Carrie Grimes 1920/05/08 [id number]  Ortho Devices Type of Ortho Device: Ace wrap, Post (short leg) splint Ortho Device/Splint Location: lle Ortho Device/Splint Interventions: Application   Post Interventions Patient Tolerated: Well Instructions Provided: Care of device   Hildred Priest 02/12/2018, 7:02 PM

## 2018-02-12 NOTE — ED Triage Notes (Signed)
Pt has been complaining of n/v/ diarrhea, weakness, decreased appetite for the past 3 days. BP 120/53, HR 68, resp 14, 98% on room air, CBG 126. EMS gave 4mg  zofran

## 2018-02-12 NOTE — ED Notes (Signed)
  Attempted to call report.  Charge RN will call me back.

## 2018-02-12 NOTE — ED Notes (Signed)
  Attempted to call report.  RN will call me back. 

## 2018-02-12 NOTE — ED Provider Notes (Signed)
[facility] EMERGENCY DEPARTMENT Provider Note   CSN: [id number] Arrival date & time:        History   Chief Complaint Chief Complaint  Patient presents with  . Nausea  . Emesis  . Weakness    Carrie Grimes is a 84 y.o. female.  Pt presents to the ED today with nausea and weakness.  The pt is very HOH and has dementia.  She is a very poor historian.  Per EMS, pt has had nausea for the past 3 days.  She has not been drinking and has had a very little appetite.  The pt was given zofran en route by EMS.     Past Medical History:  Diagnosis Date  . Allergic rhinitis, cause unspecified   . Anemia of chronic kidney failure, stage 4 (severe) (HCC)    [place] via nephrologist  . Cataracts, bilateral   . Cerebrovascular disease, unspecified    CDopplers 1/13 showed mild mixed plaque w/ 40-59% (low end) bilat ICA stenoses, the prev right CAE & patch angioplasty were patent  . Choledocholithiasis    12/11/14 CT--nonobstructive (53mm x 6 mm)  . Chronic renal insufficiency, stage IV (severe) (HCC)    Dr. Marval Regal. Not a candidate for renal replacement therapy.  Pt does not want this, either.  . Dementia with behavioral disturbance ([place])    depakote helpful  . Diaphragmatic hernia without mention of obstruction or gangrene   . Essential hypertension   . Hyperkalemia 06/12/2015   diminished renal excretion: as of 05/2016, kayexolate started, low K diet.  Kayexalate changed to veltassa 2019, veltassa increased to 2 packets qd---potassium goal 5.7 or less.  . Impaired mobility    uses walker, wheelchair  . Pernicious anemia    + amemia of CRI  . Pure hypercholesterolemia   . RBBB   . Retinal hemorrhage of right eye   . Unspecified cerebral artery occlusion with cerebral infarction    MRI Brain 12/12 showed large remote right hemispheric infarct w/ encephalomalacia w/ prominent small vessel dis superimposed on atrophy... (residual deficits are  diminished sensation on left side, left hand grip weakness,   . Unspecified disorder resulting from impaired renal function    hyperkalemia and anemia  . Unspecified hearing loss   . Unspecified venous (peripheral) insufficiency    lasix  . Unspecified vitamin D deficiency     Patient Active Problem List   Diagnosis Date Noted  . AKI (acute kidney injury) ([place]) 02/12/2018  . Debilitated patient 11/24/2017  . Asymmetric edema of both lower extremities 10/28/2017  . Abnormal serum thyroid stimulating hormone (TSH) level 08/20/2017  . Hyperglycemia 08/20/2017  . Chronic renal insufficiency, stage IV (severe) ([place]) 10/26/2015  . Hyperkalemia 06/12/2015  . HTN (hypertension), benign 11/02/2013  . Venous stasis dermatitis 11/02/2013  . Cellulitis of both lower extremities 09/29/2013  . Urinary incontinence 09/09/2012  . Dementia with behavioral disturbance ([place]) 06/26/2011  . Edema 10/23/2010  . DJD (degenerative joint disease) 10/23/2010  . ALLERGIC RHINITIS 10/08/2008  . ANEMIA 08/08/2008  . VITAMIN D DEFICIENCY 07/22/2008  . Presbycusis of both ears 07/22/2008  . CKD (chronic kidney disease), stage IV ([place]) 10/08/2007  . HYPERCHOLESTEROLEMIA 04/12/2007  . Pernicious anemia 04/12/2007  . CEREBROVASCULAR DISEASE 04/12/2007  . Chronic venous insufficiency 04/09/2007  . History of completed stroke 02/06/2007  . HIATAL HERNIA 02/06/2007    Past Surgical History:  Procedure Laterality Date  . CAROTID ENDARTERECTOMY    . CHOLECYSTECTOMY    .  ENDOSCOPIC RETROGRADE CHOLANGIOPANCREATOGRAPHY (ERCP) WITH PROPOFOL N/A 03/09/2015   Procedure: ENDOSCOPIC RETROGRADE CHOLANGIOPANCREATOGRAPHY (ERCP) WITH PROPOFOL;  Surgeon: Milus Banister, MD;  Location: [facility] ENDOSCOPY;  Service: Endoscopy;  Laterality: N/A;  . laser surgery on right eye  12/02/2012   Dr. Baird [facility] at [facility]     OB History   None      Home Medications    Prior to Admission medications   Medication Sig Start  Date End Date Taking? Authorizing Provider  acetaminophen (TYLENOL) 500 MG tablet Take 1,000 mg by mouth 3 (three) times daily.     [provider]  ammonium lactate (AMLACTIN) 12 % cream Apply topically to lower legs at bedtime every night for dry/thick skin 06/12/17   McGowen, Adrian Blackwater, MD  cetirizine (ZYRTEC) 10 MG tablet Take 10 mg by mouth daily.    [provider]  clopidogrel (PLAVIX) 75 MG tablet Take 1 tablet (75 mg total) by mouth daily. RESTART THIS IN 3 DAYS Patient taking differently: Take 75 mg by mouth daily.  03/09/15   Milus Banister, MD  cyanocobalamin (,VITAMIN B-12,) 1000 MCG/ML injection Inject 1,000 mcg into the skin every 30 (thirty) days. On the 12th of every month.    [provider]  divalproex (DEPAKOTE SPRINKLE) 125 MG capsule 1 tab po qhs Patient taking differently: Take 125 mg by mouth at bedtime.  10/24/16   McGowen, Adrian Blackwater, MD  fluticasone (CUTIVATE) 0.05 % cream Apply to affected pinkish rash of both lower extremities as needed Patient taking differently: Apply 1 application topically 2 (two) times daily. For rash on legs 05/22/17   McGowen, Adrian Blackwater, MD  furosemide (LASIX) 40 MG tablet Take 1 tablet (40 mg total) by mouth 2 (two) times daily. Patient taking differently: Take 40 mg by mouth daily.  11/11/17   McGowen, Adrian Blackwater, MD  hydrALAZINE (APRESOLINE) 25 MG tablet Take 1 tablet (25 mg total) by mouth 3 (three) times daily. 06/25/16   McGowen, Adrian Blackwater, MD  hydrocortisone 1 % ointment Apply 1 application topically 2 (two) times daily. Apply to BOTH Lower Extremities Twice a day 10/30/17   Roxan Hockey, MD  loperamide (IMODIUM A-D) 2 MG tablet Take 4 mg by mouth daily.     [provider]  Multiple Vitamins-Iron (MULTIVITAMINS WITH IRON) TABS tablet Take 1 tablet by mouth daily.    [provider]  oxybutynin (DITROPAN-XL) 5 MG 24 hr tablet Take 5 mg by mouth at bedtime.  05/13/17   [provider]  patiromer  Daryll Drown) 8.4 g packet 1 packet po bid.  Do not give within 3 hours of taking any other medications.  May give with OR without food. Patient taking differently: Take 8.4 g by mouth 2 (two) times daily. Do not give within 3 hours of taking any other medications.  May give with OR without food. 11/11/17   McGowen, Adrian Blackwater, MD  ranitidine (ZANTAC) 75 MG tablet Take 75 mg by mouth daily.    [provider]  traMADol (ULTRAM) 50 MG tablet Take 1 tablet (50 mg total) by mouth every 6 (six) hours as needed. 01/15/18   McGowen, Adrian Blackwater, MD  vitamin C (ASCORBIC ACID) 500 MG tablet Take 500 mg by mouth daily.     [provider]    Family History Family History  Problem Relation Age of Onset  . Heart disease Mother   . Heart disease Father     Social History Social History  Tobacco Use  . Smoking status: Never Smoker  . Smokeless tobacco: Never Used  Substance Use Topics  . Alcohol use: No  . Drug use: No     Allergies   Procaine hcl   Review of Systems Review of Systems  Unable to perform ROS: Dementia     Physical Exam Updated Vital Signs BP (!) 106/42   Pulse 71   Temp (!) 97.5 F (36.4 C) (Oral)   Resp 14   SpO2 97%   Physical Exam  Constitutional: She appears well-developed and well-nourished.  HENT:  Head: Normocephalic and atraumatic.  Right Ear: External ear normal.  Left Ear: External ear normal.  Nose: Nose normal.  Mouth/Throat: Mucous membranes are dry.  Eyes: Pupils are equal, round, and reactive to light. Conjunctivae and EOM are normal.  Neck: Normal range of motion. Neck supple.  Cardiovascular: Normal rate, regular rhythm, normal heart sounds and intact distal pulses.  Pulmonary/Chest: Effort normal and breath sounds normal.  Abdominal: Soft. Bowel sounds are normal.  Musculoskeletal: Normal range of motion.  LLE edema.  See picture.  Neurological: She is alert.  Skin: Skin is warm. Capillary refill takes less than 2 seconds.    Psychiatric:  Unable to assess  Nursing note and vitals reviewed.      ED Treatments / Results  Labs (all labs ordered are listed, but only abnormal results are displayed) Labs Reviewed  COMPREHENSIVE METABOLIC PANEL - Abnormal; Notable for the following components:      Result Value   Potassium 3.2 (*)    CO2 19 (*)    BUN 117 (*)    Creatinine, Ser 3.70 (*)    Total Protein 5.7 (*)    Albumin 2.8 (*)    AST 13 (*)    GFR calc non Af Amer 10 (*)    GFR calc Af Amer 11 (*)    All other components within normal limits  CBC WITH DIFFERENTIAL/PLATELET - Abnormal; Notable for the following components:   RBC 3.10 (*)    Hemoglobin 9.5 (*)    HCT 30.7 (*)    RDW 16.3 (*)    All other components within normal limits  URINALYSIS, ROUTINE W REFLEX MICROSCOPIC - Abnormal; Notable for the following components:   APPearance CLOUDY (*)    Ketones, ur 5 (*)    Leukocytes, UA MODERATE (*)    Bacteria, UA MANY (*)    All other components within normal limits  URINE CULTURE    EKG EKG Interpretation  Date/Time:  Thursday February 12 2018 14:56:56 EDT Ventricular Rate:  70 PR Interval:    QRS Duration: 152 QT Interval:  452 QTC Calculation: 488 R Axis:   8 Text Interpretation:  Sinus rhythm Prolonged PR interval Consider left atrial enlargement Right bundle branch block No significant change since last tracing Confirmed by Isla Pence [phone]) on 02/12/2018 3:01:42 PM   Radiology Dg Ankle Complete Left  Result Date: 02/12/2018 CLINICAL DATA:  Left ankle pain and swelling EXAM: LEFT ANKLE COMPLETE - 3+ VIEW COMPARISON:  01/04/2018 ankle radiographs. FINDINGS: A nondisplaced spiral oblique distal left diaphyseal fracture has declared itself since prior with minimal sclerosis along the fracture line now noted, suggesting it is subacute nature. Persistent soft tissue swelling of the included left leg, ankle and dorsum of foot. Severe osteopenic appearance of the ankle may  further limit assessment in the detection of additional nondisplaced fractures. No joint dislocation. Calcaneal enthesopathy is noted. IMPRESSION: A nondisplaced subacute spiral fracture of  the tibial diaphysis has declared itself since prior likely contributing to the diffuse soft tissue swelling of the included leg, ankle and foot. No additional fractures are identified though detection can be limited due to the marked osteopenic appearance of the bones. No joint dislocation. Electronically Signed   By: Ashley Royalty M.D.   On: 02/12/2018 18:29   Dg Abdomen Acute W/chest  Result Date: 02/12/2018 CLINICAL DATA:  Nausea vomiting, weakness and decreased appetite. EXAM: DG ABDOMEN ACUTE W/ 1V CHEST COMPARISON:  None. FINDINGS: There is no evidence of dilated bowel loops or free intraperitoneal air. No radiopaque calculi or other significant radiographic abnormality is seen. Enlarged cardiac silhouette. Calcific atherosclerotic disease of the aorta. Both lungs are clear. IMPRESSION: Negative abdominal radiographs. No acute cardiopulmonary disease. Enlarged cardiac silhouette with calcific atherosclerotic disease of the aorta. Electronically Signed   By: Fidela Salisbury M.D.   On: 02/12/2018 16:02    Procedures Procedures (including critical care time)  Medications Ordered in ED Medications  sodium chloride 0.9 % bolus 1,000 mL (0 mLs Intravenous Stopped 02/12/18 1825)     Initial Impression / Assessment and Plan / ED Course  I have reviewed the triage vital signs and the nursing notes.  Pertinent labs & imaging results that were available during my care of the patient were reviewed by me and considered in my medical decision making (see chart for details).    According to chart, pt fell on 9/8 and was seen here.  Initial ankle xrays were negative.  She has had continued swelling since then.  X-rays today show fx.  Pt will be placed in a splint.  Kidney failure is worsening over the past  week.  MS is improving with IV hydration.  UA shows some bacteria, urine will be sent for cx.  Pt d/w triad hospitalists (Dr. Marlowe Sax) for admission.  Final Clinical Impressions(s) / ED Diagnoses   Final diagnoses:  Failure to thrive in adult  Acute renal failure superimposed on stage 5 chronic kidney disease, not on chronic dialysis, unspecified acute renal failure type ([place])  Closed fracture of distal end of left tibia, unspecified fracture morphology, initial encounter    ED Discharge Orders    None       Isla Pence, MD 02/12/18 1918

## 2018-02-12 NOTE — H&P (Addendum)
History and Physical    Carrie Grimes ZWC:[id number] DOB: 11-Nov-1920 DOA: 02/12/2018  PCP: Tammi Sou, MD Patient coming from: Nursing home [facility])  Chief Complaint: Nausea, area, weakness  HPI: Carrie Grimes is a 84 y.o. female with medical history significant of dementia, CVA, hypertension, CKD IV presenting to the hospital from her nursing home via EMS for further evaluation of nausea, diarrhea, weakness, decreased appetite, and trouble swallowing.  Per EMS, patient has had nausea for the past 3 days.  She has not been drinking and has a very poor appetite.  She was given Zofran in route by EMS. Patient was noted to have left ankle pain and swelling in the ED. Per chart, she fell on September 8 and was seen here.  Initial ankle x-ray was negative.  Patient has dementia and is oriented to self only.  In addition, she has decreased hearing which makes it difficult to obtain history from her.  Patient does state that she has lost her taste and has not been able to eat and drink much. She believes she has lost weight.  Reports falling off of her wheelchair and injuring her leg in the past.  Denies having any pain.  ED Course: Vitals stable on arrival.  Labs showing no leukocytosis.  Potassium 3.2, BUN 117, creatinine 3.7, and GFR 10.  A week ago, BUN 106 and creatinine 2.6.  GFR was 18 a month ago.  UA showing moderate amount of leukocytes but negative for nitrite (not clean catch).  X-ray of chest and abdomen showing clear lungs and no acute intra-abdominal disease.  X-ray of the left ankle revealed a nondisplaced subacute spiral fracture of the tibial diaphysis.  Patient was placed in a splint.  Her mental status improved with 1 L of normal saline bolus in the ED.  TRH paged to admit.  Review of Systems: As per HPI otherwise 10 point review of systems negative.  Past Medical History:  Diagnosis Date  . Allergic rhinitis, cause unspecified   . Anemia of chronic  kidney failure, stage 4 (severe) (HCC)    [facility] via nephrologist  . Cataracts, bilateral   . Cerebrovascular disease, unspecified    CDopplers 1/13 showed mild mixed plaque w/ 40-59% (low end) bilat ICA stenoses, the prev right CAE & patch angioplasty were patent  . Choledocholithiasis    12/11/14 CT--nonobstructive (53mm x 6 mm)  . Chronic renal insufficiency, stage IV (severe) (HCC)    Dr. Marval Regal. Not a candidate for renal replacement therapy.  Pt does not want this, either.  . Dementia with behavioral disturbance ([place])    depakote helpful  . Diaphragmatic hernia without mention of obstruction or gangrene   . Essential hypertension   . Hyperkalemia 06/12/2015   diminished renal excretion: as of 05/2016, kayexolate started, low K diet.  Kayexalate changed to veltassa 2019, veltassa increased to 2 packets qd---potassium goal 5.7 or less.  . Impaired mobility    uses walker, wheelchair  . Pernicious anemia    + amemia of CRI  . Pure hypercholesterolemia   . RBBB   . Retinal hemorrhage of right eye   . Unspecified cerebral artery occlusion with cerebral infarction    MRI Brain 12/12 showed large remote right hemispheric infarct w/ encephalomalacia w/ prominent small vessel dis superimposed on atrophy... (residual deficits are diminished sensation on left side, left hand grip weakness,   . Unspecified disorder resulting from impaired renal function    hyperkalemia and anemia  . Unspecified hearing  loss   . Unspecified venous (peripheral) insufficiency    lasix  . Unspecified vitamin D deficiency     Past Surgical History:  Procedure Laterality Date  . CAROTID ENDARTERECTOMY    . CHOLECYSTECTOMY    . ENDOSCOPIC RETROGRADE CHOLANGIOPANCREATOGRAPHY (ERCP) WITH PROPOFOL N/A 03/09/2015   Procedure: ENDOSCOPIC RETROGRADE CHOLANGIOPANCREATOGRAPHY (ERCP) WITH PROPOFOL;  Surgeon: Milus Banister, MD;  Location: [facility];  Service: Endoscopy;  Laterality: N/A;  . laser surgery on  right eye  12/02/2012   Dr. Baird [facility] at [facility]     reports that she has never smoked. She has never used smokeless tobacco. She reports that she does not drink alcohol or use drugs.  Allergies  Allergen Reactions  . Procaine Hcl Shortness Of Breath, Palpitations and Other (See Comments)    "Reaction to shot at dentist office w/ tachycardia \\T \ SOB (? if really from combination w/epi)"  <<??     Family History  Problem Relation Age of Onset  . Heart disease Mother   . Heart disease Father     Prior to Admission medications   Medication Sig Start Date End Date Taking? Authorizing Provider  acetaminophen (TYLENOL) 500 MG tablet Take 1,000 mg by mouth 3 (three) times daily.     [provider]  ammonium lactate (AMLACTIN) 12 % cream Apply topically to lower legs at bedtime every night for dry/thick skin 06/12/17   McGowen, Adrian Blackwater, MD  cetirizine (ZYRTEC) 10 MG tablet Take 10 mg by mouth daily.    [provider]  clopidogrel (PLAVIX) 75 MG tablet Take 1 tablet (75 mg total) by mouth daily. RESTART THIS IN 3 DAYS Patient taking differently: Take 75 mg by mouth daily.  03/09/15   Milus Banister, MD  cyanocobalamin (,VITAMIN B-12,) 1000 MCG/ML injection Inject 1,000 mcg into the skin every 30 (thirty) days. On the 12th of every month.    [provider]  divalproex (DEPAKOTE SPRINKLE) 125 MG capsule 1 tab po qhs Patient taking differently: Take 125 mg by mouth at bedtime.  10/24/16   McGowen, Adrian Blackwater, MD  fluticasone (CUTIVATE) 0.05 % cream Apply to affected pinkish rash of both lower extremities as needed Patient taking differently: Apply 1 application topically 2 (two) times daily. For rash on legs 05/22/17   McGowen, Adrian Blackwater, MD  furosemide (LASIX) 40 MG tablet Take 1 tablet (40 mg total) by mouth 2 (two) times daily. Patient taking differently: Take 40 mg by mouth daily.  11/11/17   McGowen, Adrian Blackwater, MD  hydrALAZINE (APRESOLINE) 25 MG tablet Take 1  tablet (25 mg total) by mouth 3 (three) times daily. 06/25/16   McGowen, Adrian Blackwater, MD  hydrocortisone 1 % ointment Apply 1 application topically 2 (two) times daily. Apply to BOTH Lower Extremities Twice a day 10/30/17   Roxan Hockey, MD  loperamide (IMODIUM A-D) 2 MG tablet Take 4 mg by mouth daily.     [provider]  Multiple Vitamins-Iron (MULTIVITAMINS WITH IRON) TABS tablet Take 1 tablet by mouth daily.    [provider]  oxybutynin (DITROPAN-XL) 5 MG 24 hr tablet Take 5 mg by mouth at bedtime.  05/13/17   [provider]  patiromer Daryll Drown) 8.4 g packet 1 packet po bid.  Do not give within 3 hours of taking any other medications.  May give with OR without food. Patient taking differently: Take 8.4 g by mouth 2 (two) times daily. Do not give within 3 hours of taking any  other medications.  May give with OR without food. 11/11/17   McGowen, Adrian Blackwater, MD  ranitidine (ZANTAC) 75 MG tablet Take 75 mg by mouth daily.    [provider]  traMADol (ULTRAM) 50 MG tablet Take 1 tablet (50 mg total) by mouth every 6 (six) hours as needed. 01/15/18   McGowen, Adrian Blackwater, MD  vitamin C (ASCORBIC ACID) 500 MG tablet Take 500 mg by mouth daily.     [provider]    Physical Exam: Vitals:   02/12/18 1830 02/12/18 1930 02/12/18 2030 02/12/18 2100  BP: (!) 114/42 (!) 112/40 (!) 131/54 (!) 140/54  Pulse: 68 68 68 72  Resp: 12 (!) 23 (!) 22 19  Temp:    97.8 F (36.6 C)  TempSrc:    Oral  SpO2: 99% 98% 100% 100%   Physical Exam  Constitutional: She appears well-developed and well-nourished. No distress.  Frail elderly female  HENT:  Dry mucous membranes Decreased hearing  Eyes: Right eye exhibits no discharge. Left eye exhibits no discharge.  Neck: Neck supple. No tracheal deviation present.  Cardiovascular: Normal rate, regular rhythm and intact distal pulses.  Pulmonary/Chest: Effort normal and breath sounds normal. No respiratory distress. She has  no wheezes. She has no rales.  Abdominal: Soft. Bowel sounds are normal. She exhibits no distension. There is tenderness. There is guarding. There is no rebound.  Musculoskeletal: She exhibits no edema.  Neurological: She is alert.  Oriented to person only  Skin: Skin is warm and dry. She is not diaphoretic.     Labs on Admission: I have personally reviewed following labs and imaging studies  CBC: Recent Labs  Lab 02/12/18 1535 02/13/18 0141  WBC 7.8 6.0  NEUTROABS 6.4  --   HGB 9.5* 8.9*  HCT 30.7* 28.6*  MCV 99.0 97.6  PLT 197 229   Basic Metabolic Panel: Recent Labs  Lab 02/12/18 1535 02/13/18 0141  NA 140 140  K 3.2* 3.6  CL 106 111  CO2 19* 19*  GLUCOSE 98 132*  BUN 117* 106*  CREATININE 3.70* 3.19*  CALCIUM 9.3 8.7*   GFR: Estimated Creatinine Clearance: 7.3 mL/min (A) (by C-G formula based on SCr of 3.19 mg/dL (H)). Liver Function Tests: Recent Labs  Lab 02/12/18 1535  AST 13*  ALT 12  ALKPHOS 98  BILITOT 0.9  PROT 5.7*  ALBUMIN 2.8*   No results for input(s): LIPASE, AMYLASE in the last 168 hours. No results for input(s): AMMONIA in the last 168 hours. Coagulation Profile: No results for input(s): INR, PROTIME in the last 168 hours. Cardiac Enzymes: No results for input(s): CKTOTAL, CKMB, CKMBINDEX, TROPONINI in the last 168 hours. BNP (last 3 results) No results for input(s): PROBNP in the last 8760 hours. HbA1C: No results for input(s): HGBA1C in the last 72 hours. CBG: No results for input(s): GLUCAP in the last 168 hours. Lipid Profile: No results for input(s): CHOL, HDL, LDLCALC, TRIG, CHOLHDL, LDLDIRECT in the last 72 hours. Thyroid Function Tests: No results for input(s): TSH, T4TOTAL, FREET4, T3FREE, THYROIDAB in the last 72 hours. Anemia Panel: No results for input(s): VITAMINB12, FOLATE, FERRITIN, TIBC, IRON, RETICCTPCT in the last 72 hours. Urine analysis:    Component Value Date/Time   COLORURINE YELLOW 02/12/2018 1725    APPEARANCEUR CLOUDY (A) 02/12/2018 1725   LABSPEC 1.012 02/12/2018 1725   PHURINE 5.0 02/12/2018 1725   GLUCOSEU NEGATIVE 02/12/2018 1725   GLUCOSEU NEGATIVE 01/06/2012 1620   HGBUR NEGATIVE 02/12/2018 1725  BILIRUBINUR NEGATIVE 02/12/2018 1725   KETONESUR 5 (A) 02/12/2018 1725   PROTEINUR NEGATIVE 02/12/2018 1725   UROBILINOGEN 0.2 01/06/2012 1620   NITRITE NEGATIVE 02/12/2018 1725   LEUKOCYTESUR MODERATE (A) 02/12/2018 1725    Radiological Exams on Admission: Dg Ankle Complete Left  Result Date: 02/12/2018 CLINICAL DATA:  Left ankle pain and swelling EXAM: LEFT ANKLE COMPLETE - 3+ VIEW COMPARISON:  01/04/2018 ankle radiographs. FINDINGS: A nondisplaced spiral oblique distal left diaphyseal fracture has declared itself since prior with minimal sclerosis along the fracture line now noted, suggesting it is subacute nature. Persistent soft tissue swelling of the included left leg, ankle and dorsum of foot. Severe osteopenic appearance of the ankle may further limit assessment in the detection of additional nondisplaced fractures. No joint dislocation. Calcaneal enthesopathy is noted. IMPRESSION: A nondisplaced subacute spiral fracture of the tibial diaphysis has declared itself since prior likely contributing to the diffuse soft tissue swelling of the included leg, ankle and foot. No additional fractures are identified though detection can be limited due to the marked osteopenic appearance of the bones. No joint dislocation. Electronically Signed   By: Ashley Royalty M.D.   On: 02/12/2018 18:29   Dg Abdomen Acute W/chest  Result Date: 02/12/2018 CLINICAL DATA:  Nausea vomiting, weakness and decreased appetite. EXAM: DG ABDOMEN ACUTE W/ 1V CHEST COMPARISON:  None. FINDINGS: There is no evidence of dilated bowel loops or free intraperitoneal air. No radiopaque calculi or other significant radiographic abnormality is seen. Enlarged cardiac silhouette. Calcific atherosclerotic disease of the aorta.  Both lungs are clear. IMPRESSION: Negative abdominal radiographs. No acute cardiopulmonary disease. Enlarged cardiac silhouette with calcific atherosclerotic disease of the aorta. Electronically Signed   By: Fidela Salisbury M.D.   On: 02/12/2018 16:02    EKG: Independently reviewed.  Sinus rhythm, PR prolongation, and right bundle branch block.  Similar to prior tracings.  Assessment/Plan Principal Problem:   AKI (acute kidney injury) ([place]) Active Problems:   CKD (chronic kidney disease), stage IV (HCC)   HTN (hypertension)   Nausea   Diarrhea   Dysphagia   Tibial fracture   Hypokalemia   History of CVA (cerebrovascular accident)   Seizure disorder (HCC)   Anemia   Physical deconditioning   Anorexia   FTT (failure to thrive) in adult  AKI on CKD IV Likely prerenal in the setting of nausea, diarrhea, and poor appetite. BUN 117, creatinine 3.7, and GFR 10. A week ago, BUN 106 and creatinine 2.6. GFR was 18 a month ago.  -IV fluid hydration -Continue to monitor renal function; BMP in a.m. -Avoid nephrotoxic agents  Nausea, diarrhea, dysphagia, anorexia, failure to thrive  Reported on paperwork from nursing home.  Unable to obtain history from patient due to her baseline dementia.  Noted to be guarding on exam but abdomen appears soft with no distention and no rebound.  Abdominal radiograph negative. -Keep n.p.o. at this time due to risk of aspiration  -Modified barium swallow -SLP eval -Infection less likely to explain patient's nausea and poor appetite as she is afebrile and does not have leukocytosis. UA showing moderate amount of leukocytes but negative for nitrite (not clean catch).  Urine culture pending. -C. difficile PCR, enteric precautions  -IV Zofran PRN  Tibial fracture Patient fell on September 8.  Initial x-ray negative for fracture but repeat x-ray of the left ankle done now showing a nondisplaced subacute spiral fracture of the tibial diaphysis. -Patient was  placed in a splint in the ED -Patient denies  having any pain.  Tylenol PRN and continue home tramadol as needed.  Hypokalemia Potassium 3.2 in the setting of diarrhea and poor appetite. -IV potassium 10 mEq x 4 -BMP in a.m.  History of CVA -Continue home Plavix  Seizure disorder -Stable.  Continue IV valproate.  Hypertension -Blood pressure stable.  Hold home Lasix and hydralazine at this time.  Chronic anemia -Stable.  Hemoglobin 9.5; baseline 9-10.  Physical deconditioning -PT consult -Social work consult as patient will need nursing home placement  DVT prophylaxis: Subcutaneous heparin Code Status: DNR Family Communication: No family present at bedside. Disposition Plan: Anticipate discharge to SNF in 1 to 2 days. Consults called: None Admission status: Inpatient It is my clinical opinion that admission to INPATIENT is reasonable and necessary in this 84 y.o. female . presenting with symptoms of nausea, diarrhea, dehydration, poor appetite, dysphagia, recent fall . and pertinent positives on radiographic and laboratory data including: AKI, tibial fracture . Workup and treatment include IV fluids, modified barium swallow, SLP eval, PT eval  Given the aforementioned, the predictability of an adverse outcome is felt to be significant. I expect that the patient will require at least 2 midnights in the hospital to treat this condition.    Shela Leff MD [facility] Pager [phone]  If 7PM-7AM, please contact night-coverage [email or web address] Password [email or web address]  02/13/2018, 3:15 AM

## 2018-02-13 ENCOUNTER — Inpatient Hospital Stay (HOSPITAL_COMMUNITY): Payer: Medicare Other

## 2018-02-13 DIAGNOSIS — D649 Anemia, unspecified: Secondary | ICD-10-CM

## 2018-02-13 DIAGNOSIS — R630 Anorexia: Secondary | ICD-10-CM

## 2018-02-13 DIAGNOSIS — N184 Chronic kidney disease, stage 4 (severe): Secondary | ICD-10-CM

## 2018-02-13 DIAGNOSIS — R627 Adult failure to thrive: Secondary | ICD-10-CM | POA: Diagnosis not present

## 2018-02-13 DIAGNOSIS — R5381 Other malaise: Secondary | ICD-10-CM

## 2018-02-13 DIAGNOSIS — R110 Nausea: Secondary | ICD-10-CM

## 2018-02-13 DIAGNOSIS — G40909 Epilepsy, unspecified, not intractable, without status epilepticus: Secondary | ICD-10-CM

## 2018-02-13 DIAGNOSIS — I10 Essential (primary) hypertension: Secondary | ICD-10-CM

## 2018-02-13 DIAGNOSIS — N179 Acute kidney failure, unspecified: Secondary | ICD-10-CM | POA: Diagnosis not present

## 2018-02-13 DIAGNOSIS — S89102A Unspecified physeal fracture of lower end of left tibia, initial encounter for closed fracture: Secondary | ICD-10-CM

## 2018-02-13 DIAGNOSIS — E876 Hypokalemia: Secondary | ICD-10-CM

## 2018-02-13 DIAGNOSIS — S82209A Unspecified fracture of shaft of unspecified tibia, initial encounter for closed fracture: Secondary | ICD-10-CM

## 2018-02-13 DIAGNOSIS — R197 Diarrhea, unspecified: Secondary | ICD-10-CM

## 2018-02-13 DIAGNOSIS — R1310 Dysphagia, unspecified: Secondary | ICD-10-CM

## 2018-02-13 DIAGNOSIS — Z8673 Personal history of transient ischemic attack (TIA), and cerebral infarction without residual deficits: Secondary | ICD-10-CM

## 2018-02-13 LAB — CBC
HCT: 28.6 % — ABNORMAL LOW (ref 36.0–46.0)
Hemoglobin: 8.9 g/dL — ABNORMAL LOW (ref 12.0–15.0)
MCH: 30.4 pg (ref 26.0–34.0)
MCHC: 31.1 g/dL (ref 30.0–36.0)
MCV: 97.6 fL (ref 80.0–100.0)
Platelets: 184 10*3/uL (ref 150–400)
RBC: 2.93 MIL/uL — ABNORMAL LOW (ref 3.87–5.11)
RDW: 16.4 % — ABNORMAL HIGH (ref 11.5–15.5)
WBC: 6 10*3/uL (ref 4.0–10.5)
nRBC: 0 % (ref 0.0–0.2)

## 2018-02-13 LAB — BASIC METABOLIC PANEL
Anion gap: 10 (ref 5–15)
BUN: 106 mg/dL — ABNORMAL HIGH (ref 8–23)
CO2: 19 mmol/L — ABNORMAL LOW (ref 22–32)
Calcium: 8.7 mg/dL — ABNORMAL LOW (ref 8.9–10.3)
Chloride: 111 mmol/L (ref 98–111)
Creatinine, Ser: 3.19 mg/dL — ABNORMAL HIGH (ref 0.44–1.00)
GFR calc Af Amer: 13 mL/min — ABNORMAL LOW (ref >60)
GFR calc non Af Amer: 11 mL/min — ABNORMAL LOW (ref >60)
Glucose, Bld: 132 mg/dL — ABNORMAL HIGH (ref 70–99)
Potassium: 3.6 mmol/L (ref 3.5–5.1)
Sodium: 140 mmol/L (ref 135–145)

## 2018-02-13 MED ORDER — BISACODYL 10 MG RE SUPP: 10.0000 mg | Freq: Every day | RECTAL | Status: DC | PRN

## 2018-02-13 MED ORDER — PANTOPRAZOLE SODIUM 40 MG PO TBEC
40.0000 mg | DELAYED_RELEASE_TABLET | Freq: Every day | ORAL | Status: DC
  Administered 2018-02-13 – 2018-02-14 (×2): 40 mg via ORAL
  Filled 2018-02-13 (×2): qty 1

## 2018-02-13 MED ORDER — SENNOSIDES-DOCUSATE SODIUM 8.6-50 MG PO TABS
1.0000 | ORAL_TABLET | Freq: Every day | ORAL | Status: DC
  Administered 2018-02-13 – 2018-02-14 (×2): 1 via ORAL
  Filled 2018-02-13 (×2): qty 1

## 2018-02-13 MED ORDER — POLYETHYLENE GLYCOL 3350 17 G PO PACK
17.0000 g | PACK | Freq: Every day | ORAL | Status: DC
  Administered 2018-02-13 – 2018-02-14 (×2): 17 g via ORAL
  Filled 2018-02-13 (×2): qty 1

## 2018-02-13 MED ORDER — MORPHINE SULFATE (PF) 2 MG/ML IV SOLN
2.0000 mg | Freq: Once | INTRAVENOUS | Status: AC
  Administered 2018-02-13: 2 mg via INTRAVENOUS
  Filled 2018-02-13: qty 1

## 2018-02-13 MED ORDER — DIVALPROEX SODIUM 125 MG PO CSDR
125.0000 mg | DELAYED_RELEASE_CAPSULE | Freq: Every day | ORAL | Status: DC
  Administered 2018-02-13: 125 mg via ORAL
  Filled 2018-02-13: qty 1

## 2018-02-13 MED ORDER — DEXTROSE-NACL 5-0.9 % IV SOLN
INTRAVENOUS | Status: AC
  Administered 2018-02-13: 17:00:00 via INTRAVENOUS

## 2018-02-13 NOTE — Progress Notes (Signed)
CCMD called. 2nd degree AVB (Mobitz II) was noted for the first time. MD is notified.

## 2018-02-13 NOTE — Evaluation (Signed)
Physical Therapy Evaluation Patient Details Name: Carrie Grimes MRN: [id number] DOB: Apr 01, 1921 Today's Date: 02/13/2018   History of Present Illness  Carrie Grimes is a 84 y.o. female with medical history significant of dementia, CVA, hypertension, CKD IV presenting to the hospital from her nursing home via EMS for further evaluation of nausea, diarrhea, weakness, decreased appetite, and trouble swallowing.  Also reports falling off of her wheelchair and injuring her leg in the past, xrays L LE positive for nondisplaced subacute spiral fracture of the tibial diaphysis.  Clinical Impression  Patient presents with decreased mobility due to general weakness, new splint on L LE, decreased activity tolerance, decreased balance and safety awareness.  She will benefit from skilled PT in the acute setting to allow return to facility with staff assist and follow up HHPT.     Follow Up Recommendations Supervision/Assistance - 24 hour;Home health PT(@ ALF)    Equipment Recommendations  None recommended by PT    Recommendations for Other Services       Precautions / Restrictions Precautions Precautions: Fall Restrictions Weight Bearing Restrictions: No Other Position/Activity Restrictions: no restrictions in orders, but limited weight on L LE with OOB to chair transfers only      Mobility  Bed Mobility Overal bed mobility: Needs Assistance Bed Mobility: Supine to Sit     Supine to sit: Min assist;HOB elevated     General bed mobility comments: increased time, able to scoot out with assist for IV/balance/safety  Transfers Overall transfer level: Needs assistance Equipment used: Rolling walker (2 wheeled) Transfers: Sit to/from [facility] Sit to Stand: Min assist Stand pivot transfers: Min assist       General transfer comment: stands without placing weight on L LE; with walker stand step to BSC some weight on L LE, and to recliner from  [facility]  Ambulation/Gait                Stairs            Wheelchair Mobility    Modified Rankin (Stroke Patients Only)       Balance Overall balance assessment: Needs assistance Sitting-balance support: Bilateral upper extremity supported Sitting balance-Leahy Scale: Fair     Standing balance support: Bilateral upper extremity supported Standing balance-Leahy Scale: Poor Standing balance comment: UE support and assist for balance                             Pertinent Vitals/Pain Pain Assessment: Faces Faces Pain Scale: Hurts little more Pain Location: generalized Pain Descriptors / Indicators: Guarding;Grimacing Pain Intervention(s): Monitored during session;Repositioned    Home Living Family/patient expects to be discharged to:: Assisted living               Home Equipment: Walker - 4 wheels Additional Comments: From [place] Grimes. Has daughter and grandson in area     Prior Function Level of [place]: Needs assistance   Gait / Transfers Assistance Needed: notes from 3 months ago state pt ambulates with rollator, but admit note states fell from w/c and now with L tibial fx  ADL's / Homemaking Assistance Needed: staff assists with ADL's        Hand Dominance   Dominant Hand: Right    Extremity/Trunk Assessment   Upper Extremity Assessment Upper Extremity Assessment: Generalized weakness    Lower Extremity Assessment Lower Extremity Assessment: RLE deficits/detail;LLE deficits/detail RLE Deficits / Details: generalized weakness LLE Deficits / Details: posterior splint  and ace wrap to L LE; able to move it out to EOB     Cervical / Trunk Assessment Cervical / Trunk Assessment: Kyphotic  Communication   Communication: HOH  Cognition Arousal/Alertness: Awake/alert Behavior During Therapy: WFL for tasks assessed/performed Overall Cognitive Status: Difficult to assess                                 General  Comments: able to tell me at [facility] and asking me to call her grandson who works in [place]; states lives at Carrie Grimes in [place] [facility] comments (skin integrity, edema, etc.): Toileted on [facility], assist for hygiene, she washed face and tops of legs    Exercises     Assessment/Plan    PT Assessment Patient needs continued PT services  PT Problem List Decreased strength;Decreased mobility;Decreased balance;Decreased knowledge of use of DME;Decreased activity tolerance;Decreased knowledge of precautions;Decreased safety awareness       PT Treatment Interventions DME instruction;Therapeutic activities;Therapeutic exercise;Gait training;Patient/family education;Balance training;Functional mobility training    PT Goals (Current goals can be found in the Care Plan section)  Acute Rehab PT Goals Patient Stated Goal: to [facility] PT Goal Formulation: With patient Time For Goal Achievement: 02/20/18 Potential to Achieve Goals: Good    Frequency Min 2X/week   Barriers to discharge        Co-evaluation               AM-PAC PT "6 Clicks" Daily Activity  Outcome Measure Difficulty turning over in bed (including adjusting bedclothes, sheets and blankets)?: Unable Difficulty moving from lying on back to sitting on the side of the bed? : Unable Difficulty sitting down on and standing up from a chair with arms (e.g., wheelchair, bedside commode, etc,.)?: Unable Help needed moving to and from a bed to chair (including a wheelchair)?: A Lot Help needed walking in hospital room?: Total Help needed climbing 3-5 steps with a railing? : Total 6 Click Score: 7    End of Session Equipment Utilized During Treatment: Gait belt Activity Tolerance: Patient tolerated treatment well Patient left: with chair alarm set;in chair;with call bell/phone within reach Nurse Communication: Mobility status PT Visit Diagnosis: Other abnormalities of gait and mobility  (R26.89);History of falling (Z91.81);Muscle weakness (generalized) (M62.81)    Time: 5732-2025 PT Time Calculation (min) (ACUTE ONLY): 43 min   Charges:   PT Evaluation $PT Eval Moderate Complexity: 1 Mod PT Treatments $Therapeutic Activity: 23-37 mins        Magda Kiel, [place] [facility] [phone] 02/13/2018   Reginia Naas 02/13/2018, 11:37 AM

## 2018-02-13 NOTE — Progress Notes (Signed)
PALLIATIVE NOTE:  Referral received for goals of care, progressive failure to thrive, dementia, and renal failure in 84 year old. I went in to see patient, and on arrival she was out of bed in the recliner. Lower extremities elevated and left leg/ankle splinting in place. Patient was tearful and stated her leg was hurting 9/10 and it was hurting so bad it was making her nauseous. Bedside RN notified.  I also introduced myself and Palliative care to her. Patient is A&O x3. She was able to correctly identify her name, dob, location, year, president, and notify me that I should contact her daughter Hassan Rowan.   I have contacted Sander Radon [phone] home) and [phone] (mobile) several times today and left voicemails asking her to contact me to schedule goals of care meeting.   I also attempted to contact patient's son Mellody Life on his listed number of [phone] with no answer and voicemail left.   Once family calls and we are able to schedule a GOC discussion detailed note and recommendations will follow.  Thank you for your referral.   Alda Lea, AGPCNP-BC Palliative Medicine Team  Phone: [phone] Fax: [phone] Pager: [phone] Amion: [place]. [place]

## 2018-02-13 NOTE — Progress Notes (Signed)
PALLIATIVE NOTE:   Spoke with patient's daughter, Sander Radon. Daughter was somewhat confused as to what was going on with her mother's care and also verbalized she was unaware until today that she had even been hospitalized.   Daughter expressed that she wanted her mother to return to [facility] and receive HHPT at facility if possible. She expressed that she recently spoke with Caryl Pina, [place] prior to contacting me. Daughter verbalized she continues to work and lives over an hour away. She is unable to meet with our team and expressed if it was not a life-threatening emergency at this time her main concern was why her mother was hospitalized, what was being done, and what the plan is for her being discharged back to the facility.   She confirmed patient is a DNR/DNI. Felt no other need for Palliative at this point and it may be in the future but she has to figure out what is going on first. Support given and I also attempted to update her with mother's care as I had seen an assessed her earlier. Daughter appreciative of updates etc.   She also reported that patient's grandson worked here at the hospital as an OR Marine scientist and staff has permission to give him all information as needed and requested in her absence. His name is Corky Downs.   Daughter aware that I would contact attending and request updates as well as CSW. I have spoken to [place], [place].   At this time daughter is unavailable to meet with Palliative. States she is not available today, tomorrow, and the earliest would possibly be Sunday after 330 if she was able to drive the distance. Advised team would monitor patient's discharge plan and reach out Sunday if patient remained hospitalized.   Alda Lea, AGPCNP-BC Palliative Medicine Team  Phone: [phone] Pager: [phone] Amion: Bjorn Pippin

## 2018-02-13 NOTE — Progress Notes (Signed)
CSW spoke with patient's POA daughter who reported that patient is from [facility] in which patient has had physical therapy services brought to [facility] in the past. Daughter reported this would most likely be the plan at discharge for patient to go back to ALF with home health and PT services.   Notifying RNCM.    [place], [place]

## 2018-02-13 NOTE — Progress Notes (Signed)
PROGRESS NOTE  Carrie Grimes  IRW:[id number] DOB: 04-22-1921 DOA: 02/12/2018 PCP: Tammi Sou, MD   Brief Narrative: Carrie Grimes is a 84 y.o. female with a history of dementia, CVA, stage IV CKD, and HTN who was brought to the ED from [facility] due to reported nausea, diarrhea, weakness, decreased per oral intake, and difficulty swallowing. She had also recently fallen out of a wheelchair with evaluation of right ankle pain in the ED with negative XR, and continued to have swelling and pain in the ankle. She reported nausea and poor sense of taste but no dysphagia, odynophagia, which have made her eat and drink less with resultant weight loss. On arrival she was hypotensive but afebrile with no leukocytosis. There was no definite evidence of UTI on non-clean catch urinalysis or infiltrate on CXR. Creatinine was elevated to 3.7 from recent value of 2.6 which is thought to be her baseline. Repeat ankle XR demonstrated nondisplaced spiral fracture of the tibial diaphysis for which a splint was placed. IV fluids started and the patient was admitted for AKI and failure to thrive. Creatinine has improved but not returned to baseline with IV fluids. Speech therapy evaluator felt the patient was a mild aspiration risk and cleared her for a full liquid diet, though per oral intake has been minimal.   Assessment & Plan: Principal Problem:   AKI (acute kidney injury) ([place]) Active Problems:   CKD (chronic kidney disease), stage IV (HCC)   HTN (hypertension)   Nausea   Diarrhea   Dysphagia   Tibial fracture   Hypokalemia   History of CVA (cerebrovascular accident)   Seizure disorder ([place])   Anemia   Physical deconditioning   Anorexia   FTT (failure to thrive) in adult  AKI on stage IV CKD: Question whether progressive renal impairment caused nausea and change in taste sensation or these symptoms caused dehydration causing AKI. Improved with IV fluids, arguing for prerenal  etiology. Still not taking much by mouth, CrCl remains very low (7 ml/min).  - Continue IV fluids - Monitor BMP in AM - Renally dose medications and avoid nephrotoxins as able. - Monitor bicarb, mild acidosis due to renal failure.   Failure to thrive: Multifactorial with pain, limited mobility due to ankle fracture, renal failure, and advanced age/decreased physiologic reserve.  - PT/OT - Palliative care consulted. Both they and I have attempted to establish contact with family by phone. I returned to room with no family at bedside this morning or afternoon. Will continue efforts. With CrCl < 70ml/min and her not being a candidate for dialysis, unclear whether uremic symptoms will be controllable or not.  Closed, nondisplaced spiral fracture of right tibia: s/p fall 01/04/2018: Confirmed on repeat XR on admission.  - Splinted - PT/OT - Tylenol and tramadol prn pain. Would avoid morphine.  Nausea: Acute on chronic, denies abdominal pain, exam benign. No vomiting since arrival. Also denies dysphagia to me, but does have heartburn-like symptoms.  - Antiemetic - Will change pepcid to PPI. - Encourage po, may require frequent reminder - Urine culture sent, will continue monitoring off abx.   Hypokalemia: Likely due to poor per oral intake - Resolved with replacement.   History of CVA:  - Continue plavix, not on statin  Seizure disorder:  - Will transition back to po AED, but may need IV if emesis.   HTN: Plan to avoid hypotension.  - Hold lasix and hydralazine  Anemia, presumed to be due to chronic disease: Normocytic.  not far from baseline.  - Will monitor intermittently.   Reported diarrhea: Pt has had none since arrival, and actually reports constipation.  - Cancel precautions and CDiff assay.   RBBB, 1st degree AVB: Chronic, unchanged. 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   DVT prophylaxis: Heparin Code Status: DNR Family Communication: Unable to reach family by  phone Disposition Plan: Uncertain  Consultants:   Palliative care team  Procedures:   None  Antimicrobials:  None   Subjective: Has no specific complaints, denies diarrhea, abdominal pain and itching and hiccups. Has nausea that is stable, and remains very scared of taking po due to fear of vomiting. Does not like the taste of anything. Ankle pain is mild when not using it.  Objective: Vitals:   02/12/18 2030 02/12/18 2100 02/13/18 0541 02/13/18 1300  BP: (!) 131/54 (!) 140/54 (!) 107/44 (!) 109/47  Pulse: 68 72 (!) 59 (!) 56  Resp: (!) 22 19 13 18   Temp:  97.8 F (36.6 C) (!) 97.4 F (36.3 C) (!) 97.5 F (36.4 C)  TempSrc:  Oral Axillary Axillary  SpO2: 100% 100% 97% 100%    Intake/Output Summary (Last 24 hours) at 02/13/2018 1526 Last data filed at 02/13/2018 0542 Gross per 24 hour  Intake 771.52 ml  Output 200 ml  Net 571.52 ml   Gen: Pleasant but very hard of hearing elderly female Pulm: Non-labored breathing. Clear to auscultation bilaterally.  CV: Regular rate and rhythm. No murmur, rub, or gallop. No JVD, no pedal edema. GI: Abdomen soft, non-tender, non-distended, with normoactive bowel sounds. No organomegaly or masses felt. Ext: Warm, no deformities. Splint involving left ankle extending to knee with strength and sensation intact in toes, cap refill brisk, warm. Skin: No significant rash, wounds, ulcers Neuro: Alert, conversant, actually appears to be oriented to situation, person, time, forgot where she was. No focal neurological deficits. Psych: Judgement and insight appear fair. Mood euthymic with broad affect.  Data Reviewed: I have personally reviewed following labs and imaging studies  CBC: Recent Labs  Lab 02/12/18 1535 02/13/18 0141  WBC 7.8 6.0  NEUTROABS 6.4  --   HGB 9.5* 8.9*  HCT 30.7* 28.6*  MCV 99.0 97.6  PLT 197 026   Basic Metabolic Panel: Recent Labs  Lab 02/12/18 1535 02/13/18 0141  NA 140 140  K 3.2* 3.6  CL 106 111   CO2 19* 19*  GLUCOSE 98 132*  BUN 117* 106*  CREATININE 3.70* 3.19*  CALCIUM 9.3 8.7*   GFR: Estimated Creatinine Clearance: 7.3 mL/min (A) (by C-G formula based on SCr of 3.19 mg/dL (H)). Liver Function Tests: Recent Labs  Lab 02/12/18 1535  AST 13*  ALT 12  ALKPHOS 98  BILITOT 0.9  PROT 5.7*  ALBUMIN 2.8*   No results for input(s): LIPASE, AMYLASE in the last 168 hours. No results for input(s): AMMONIA in the last 168 hours. Coagulation Profile: No results for input(s): INR, PROTIME in the last 168 hours. Cardiac Enzymes: No results for input(s): CKTOTAL, CKMB, CKMBINDEX, TROPONINI in the last 168 hours. BNP (last 3 results) No results for input(s): PROBNP in the last 8760 hours. HbA1C: No results for input(s): HGBA1C in the last 72 hours. CBG: No results for input(s): GLUCAP in the last 168 hours. Lipid Profile: No results for input(s): CHOL, HDL, LDLCALC, TRIG, CHOLHDL, LDLDIRECT in the last 72 hours. Thyroid Function Tests: No results for input(s): TSH, T4TOTAL, FREET4, T3FREE, THYROIDAB in the last 72 hours. Anemia Panel: No results for  input(s): VITAMINB12, FOLATE, FERRITIN, TIBC, IRON, RETICCTPCT in the last 72 hours. Urine analysis:    Component Value Date/Time   COLORURINE YELLOW 02/12/2018 1725   APPEARANCEUR CLOUDY (A) 02/12/2018 1725   LABSPEC 1.012 02/12/2018 1725   PHURINE 5.0 02/12/2018 1725   GLUCOSEU NEGATIVE 02/12/2018 1725   GLUCOSEU NEGATIVE 01/06/2012 1620   HGBUR NEGATIVE 02/12/2018 1725   BILIRUBINUR NEGATIVE 02/12/2018 1725   KETONESUR 5 (A) 02/12/2018 1725   PROTEINUR NEGATIVE 02/12/2018 1725   UROBILINOGEN 0.2 01/06/2012 1620   NITRITE NEGATIVE 02/12/2018 1725   LEUKOCYTESUR MODERATE (A) 02/12/2018 1725   No results found for this or any previous visit (from the past 240 hour(s)).    Radiology Studies: Dg Ankle Complete Left  Result Date: 02/12/2018 CLINICAL DATA:  Left ankle pain and swelling EXAM: LEFT ANKLE COMPLETE - 3+ VIEW  COMPARISON:  01/04/2018 ankle radiographs. FINDINGS: A nondisplaced spiral oblique distal left diaphyseal fracture has declared itself since prior with minimal sclerosis along the fracture line now noted, suggesting it is subacute nature. Persistent soft tissue swelling of the included left leg, ankle and dorsum of foot. Severe osteopenic appearance of the ankle may further limit assessment in the detection of additional nondisplaced fractures. No joint dislocation. Calcaneal enthesopathy is noted. IMPRESSION: A nondisplaced subacute spiral fracture of the tibial diaphysis has declared itself since prior likely contributing to the diffuse soft tissue swelling of the included leg, ankle and foot. No additional fractures are identified though detection can be limited due to the marked osteopenic appearance of the bones. No joint dislocation. Electronically Signed   By: Ashley Royalty M.D.   On: 02/12/2018 18:29   Dg Abdomen Acute W/chest  Result Date: 02/12/2018 CLINICAL DATA:  Nausea vomiting, weakness and decreased appetite. EXAM: DG ABDOMEN ACUTE W/ 1V CHEST COMPARISON:  None. FINDINGS: There is no evidence of dilated bowel loops or free intraperitoneal air. No radiopaque calculi or other significant radiographic abnormality is seen. Enlarged cardiac silhouette. Calcific atherosclerotic disease of the aorta. Both lungs are clear. IMPRESSION: Negative abdominal radiographs. No acute cardiopulmonary disease. Enlarged cardiac silhouette with calcific atherosclerotic disease of the aorta. Electronically Signed   By: Fidela Salisbury M.D.   On: 02/12/2018 16:02    Scheduled Meds: . [START ON 02/15/2018] clopidogrel  75 mg Oral Daily  . famotidine  20 mg Oral Daily  . heparin  5,000 Units Subcutaneous Q8H  . loratadine  10 mg Oral Daily  . multivitamins with iron  1 tablet Oral Daily  . oxybutynin  5 mg Oral QHS  . vitamin C  500 mg Oral Daily   Continuous Infusions: . dextrose 5 % and 0.9% NaCl    .  valproate sodium Stopped (02/13/18 0106)     LOS: 1 day   Time spent: 25 minutes.  Patrecia Pour, MD [facility] [email or web address] Password [email or web address] 02/13/2018, 3:26 PM

## 2018-02-13 NOTE — Plan of Care (Signed)

## 2018-02-13 NOTE — Evaluation (Signed)
Clinical/Bedside Swallow Evaluation Patient Details  Name: Carrie Grimes MRN: [id number] Date of Birth: 06-11-1920  Today's Date: 02/13/2018 Time: SLP Start Time (ACUTE ONLY): 0850 SLP Stop Time (ACUTE ONLY): 0914 SLP Time Calculation (min) (ACUTE ONLY): 24 min  Past Medical History:  Past Medical History:  Diagnosis Date  . Allergic rhinitis, cause unspecified   . Anemia of chronic kidney failure, stage 4 (severe) (HCC)    [place] via nephrologist  . Cataracts, bilateral   . Cerebrovascular disease, unspecified    CDopplers 1/13 showed mild mixed plaque w/ 40-59% (low end) bilat ICA stenoses, the prev right CAE & patch angioplasty were patent  . Choledocholithiasis    12/11/14 CT--nonobstructive (62mm x 6 mm)  . Chronic renal insufficiency, stage IV (severe) (HCC)    Dr. Marval Regal. Not a candidate for renal replacement therapy.  Pt does not want this, either.  . Dementia with behavioral disturbance ([place])    depakote helpful  . Diaphragmatic hernia without mention of obstruction or gangrene   . Essential hypertension   . Hyperkalemia 06/12/2015   diminished renal excretion: as of 05/2016, kayexolate started, low K diet.  Kayexalate changed to veltassa 2019, veltassa increased to 2 packets qd---potassium goal 5.7 or less.  . Impaired mobility    uses walker, wheelchair  . Pernicious anemia    + amemia of CRI  . Pure hypercholesterolemia   . RBBB   . Retinal hemorrhage of right eye   . Unspecified cerebral artery occlusion with cerebral infarction    MRI Brain 12/12 showed large remote right hemispheric infarct w/ encephalomalacia w/ prominent small vessel dis superimposed on atrophy... (residual deficits are diminished sensation on left side, left hand grip weakness,   . Unspecified disorder resulting from impaired renal function    hyperkalemia and anemia  . Unspecified hearing loss   . Unspecified venous (peripheral) insufficiency    lasix  . Unspecified vitamin D  deficiency    Past Surgical History:  Past Surgical History:  Procedure Laterality Date  . CAROTID ENDARTERECTOMY    . CHOLECYSTECTOMY    . ENDOSCOPIC RETROGRADE CHOLANGIOPANCREATOGRAPHY (ERCP) WITH PROPOFOL N/A 03/09/2015   Procedure: ENDOSCOPIC RETROGRADE CHOLANGIOPANCREATOGRAPHY (ERCP) WITH PROPOFOL;  Surgeon: Milus Banister, MD;  Location: [facility] ENDOSCOPY;  Service: Endoscopy;  Laterality: N/A;  . laser surgery on right eye  12/02/2012   [facility]   HPI:  Carrie Grimes is a 84 y.o. female with medical history significant of dementia, CVA, hypertension, CKD IV presenting to the hospital from her nursing home via EMS for further evaluation of nausea, diarrhea, weakness, decreased appetite, and trouble swallowing (not been drinking and has a very poor appetite).  UA showing moderate amount of leukocytes but negative for nitrite (not clean catch).  X-ray of chest and abdomen showing clear lungs and no acute intra-abdominal disease.  X-ray of the left ankle revealed a nondisplaced subacute spiral fracture of the tibial diaphysis. No previous ST notes. Pt reports esophageal issues- see impression   Assessment / Plan / Recommendation Clinical Impression  History of esophageal/GI impairments suspected. Pt reported difficulty with "her tube" the doctor put a light down and said there was a blockage and it would dissolve a long time ago". Given her current nausea, decreased appetite,  and statement of "ginerale is only thing that has been keeping me going", likey symptoms are from a GI standpoint. She accepted tiny amount from spoon, appeared hesitant. Consumption of water and applesauce there was absent  of indications of oropharyngeal dysphagia. She reports eating chopped foods at SNF. Given her current symptoms, recommend full liquid diet at present and can upgrade to chopped when appropriate. Thin liquids, straw or cup and recommend crush pills and have pt remain upright 30 m  in if able after meals. Will continue to follow.   SLP Visit Diagnosis: Dysphagia, unspecified (R13.10)    Aspiration Risk  Mild aspiration risk    Diet Recommendation Thin liquid;Other (Comment)(full liquid diet)   Liquid Administration via: Cup;Straw Medication Administration: Crushed with puree Supervision: Staff to assist with self feeding;Patient able to self feed(assist due to visual disturbance) Postural Changes: Seated upright at 90 degrees;Remain upright for at least 30 minutes after po intake    Other  Recommendations Oral Care Recommendations: Oral care BID   Follow up Recommendations None      Frequency and Duration min 2x/week  2 weeks       Prognosis Prognosis for Safe Diet Advancement: (fair-good)      Swallow Study   General HPI: Carrie Grimes is a 84 y.o. female with medical history significant of dementia, CVA, hypertension, CKD IV presenting to the hospital from her nursing home via EMS for further evaluation of nausea, diarrhea, weakness, decreased appetite, and trouble swallowing (not been drinking and has a very poor appetite).  UA showing moderate amount of leukocytes but negative for nitrite (not clean catch).  X-ray of chest and abdomen showing clear lungs and no acute intra-abdominal disease.  X-ray of the left ankle revealed a nondisplaced subacute spiral fracture of the tibial diaphysis. No previous ST notes. Pt reports esophageal issues- see impression Type of Study: Bedside Swallow Evaluation Previous Swallow Assessment: (none found) Diet Prior to this Study: NPO Temperature Spikes Noted: No Respiratory Status: Room air History of Recent Intubation: No Behavior/Cognition: Alert;Cooperative;Pleasant mood;Requires cueing(very HOH) Oral Cavity Assessment: Dry Oral Care Completed by SLP: Yes Oral Cavity - Dentition: Poor condition;Missing dentition(has one tooth) Vision: Impaired for self-feeding Self-Feeding Abilities: Able to feed  self;Needs assist(due to vision) Patient Positioning: Upright in bed Baseline Vocal Quality: Normal Volitional Cough: Other (Comment)(adequate) Volitional Swallow: Able to elicit    Oral/Motor/Sensory Function Overall Oral Motor/Sensory Function: Within functional limits   Ice Chips Ice chips: Within functional limits   Thin Liquid Thin Liquid: Within functional limits Presentation: Straw    Nectar Thick Nectar Thick Liquid: Not tested   Honey Thick Honey Thick Liquid: Not tested   Puree Puree: Within functional limits   Solid     Solid: Impaired Oral Phase Impairments: Reduced lingual movement/coordination      Houston Siren 02/13/2018,9:32 AM  Orbie Pyo Colvin Caroli.Ed Risk analyst [phone] Office [phone]

## 2018-02-14 ENCOUNTER — Other Ambulatory Visit: Payer: Self-pay

## 2018-02-14 DIAGNOSIS — R279 Unspecified lack of coordination: Secondary | ICD-10-CM | POA: Diagnosis not present

## 2018-02-14 DIAGNOSIS — G40909 Epilepsy, unspecified, not intractable, without status epilepticus: Secondary | ICD-10-CM | POA: Diagnosis not present

## 2018-02-14 DIAGNOSIS — N184 Chronic kidney disease, stage 4 (severe): Secondary | ICD-10-CM | POA: Diagnosis not present

## 2018-02-14 DIAGNOSIS — E876 Hypokalemia: Secondary | ICD-10-CM | POA: Diagnosis not present

## 2018-02-14 DIAGNOSIS — N179 Acute kidney failure, unspecified: Secondary | ICD-10-CM | POA: Diagnosis not present

## 2018-02-14 DIAGNOSIS — R627 Adult failure to thrive: Secondary | ICD-10-CM | POA: Diagnosis not present

## 2018-02-14 DIAGNOSIS — E875 Hyperkalemia: Secondary | ICD-10-CM | POA: Diagnosis not present

## 2018-02-14 DIAGNOSIS — Z743 Need for continuous supervision: Secondary | ICD-10-CM | POA: Diagnosis not present

## 2018-02-14 DIAGNOSIS — D649 Anemia, unspecified: Secondary | ICD-10-CM | POA: Diagnosis not present

## 2018-02-14 DIAGNOSIS — I120 Hypertensive chronic kidney disease with stage 5 chronic kidney disease or end stage renal disease: Secondary | ICD-10-CM | POA: Diagnosis not present

## 2018-02-14 DIAGNOSIS — R630 Anorexia: Secondary | ICD-10-CM | POA: Diagnosis not present

## 2018-02-14 LAB — BASIC METABOLIC PANEL
Anion gap: 6 (ref 5–15)
BUN: 95 mg/dL — ABNORMAL HIGH (ref 8–23)
CO2: 20 mmol/L — ABNORMAL LOW (ref 22–32)
Calcium: 8.8 mg/dL — ABNORMAL LOW (ref 8.9–10.3)
Chloride: 114 mmol/L — ABNORMAL HIGH (ref 98–111)
Creatinine, Ser: 2.64 mg/dL — ABNORMAL HIGH (ref 0.44–1.00)
GFR, EST AFRICAN AMERICAN: 17 mL/min — AB (ref >60)
GFR, EST NON AFRICAN AMERICAN: 14 mL/min — AB (ref >60)
Glucose, Bld: 109 mg/dL — ABNORMAL HIGH (ref 70–99)
Potassium: 3.8 mmol/L (ref 3.5–5.1)
SODIUM: 140 mmol/L (ref 135–145)

## 2018-02-14 LAB — TSH: TSH: 0.766 u[IU]/mL (ref 0.350–4.500)

## 2018-02-14 MED ORDER — SENNOSIDES-DOCUSATE SODIUM 8.6-50 MG PO TABS: 1.0000 | ORAL_TABLET | Freq: Two times a day (BID) | ORAL | Status: AC | PRN

## 2018-02-14 MED ORDER — TRAMADOL HCL 50 MG PO TABS: 50.0000 mg | ORAL_TABLET | Freq: Four times a day (QID) | ORAL | 0 refills | Status: AC | PRN

## 2018-02-14 MED ORDER — PATIROMER SORBITEX CALCIUM 8.4 G PO PACK: 8.4000 g | PACK | Freq: Every day | ORAL | Status: AC

## 2018-02-14 NOTE — Plan of Care (Signed)

## 2018-02-14 NOTE — Discharge Summary (Signed)
Physician Discharge Summary  Carrie Grimes YFV:[id number] DOB: [birth year] DOA: 02/12/2018  PCP: Tammi Sou, MD  Admit date: 02/12/2018 Discharge date: 02/14/2018  Admitted From: ALF Disposition: ALF   Recommendations for Outpatient Follow-up:  1. Follow up with PCP in 1-2 weeks 2. Strongly recommend completion of MOST form. 3. Please obtain BMP this week to monitor renal function and potassium. Stopped lasix, and decreased patiromer due to hypokalemia with ongoing poor per oral intake.  4. Monitor BP, HR. Noted 2nd deg AVB for which no pacemaker was recommended and hydralazine was stopped due to hypotension. 5. Continue PT and supportive management of ankle fracture.  Home Health: PT Equipment/Devices: None new, has 2 walkers and WC Discharge Condition: Stable, improved CODE STATUS: DNR Diet recommendation: As tolerated  Brief/Interim Summary: Carrie Grimes is a 84 y.o. female with a history of dementia, CVA, stage IV CKD, and HTN who was brought to the ED from [facility] due to reported nausea, diarrhea, weakness, decreased per oral intake, and difficulty swallowing. She had also recently fallen out of a wheelchair with evaluation of right ankle pain in the ED with negative XR, and continued to have swelling and pain in the ankle. She reported nausea and poor sense of taste but no dysphagia, odynophagia, which have made her eat and drink less with resultant weight loss. On arrival she was hypotensive but afebrile with no leukocytosis. There was no definite evidence of UTI on non-clean catch urinalysis or infiltrate on CXR. Creatinine was elevated to 3.7 from recent value of 2.6 which is thought to be her baseline. Repeat ankle XR demonstrated nondisplaced spiral fracture of the tibial diaphysis for which a splint was placed. IV fluids started and the patient was admitted for AKI and failure to thrive. Creatinine has returned to baseline with IV fluids and her per  oral intake has improved. No vomiting since admission.   Discharge Diagnoses:  Principal Problem:   AKI (acute kidney injury) ([place]) Active Problems:   CKD (chronic kidney disease), stage IV (H[facility])   HTN (hypertension)   Nausea   Diarrhea   Dysphagia   Tibial fracture   Hypokalemia   History of CVA (cerebrovascular accident)   Seizure disorder ([place])   Anemia   Physical deconditioning   Anorexia   FTT (failure to thrive) in adult  AKI on stage IV CKD: Question whether progressive renal impairment caused nausea and change in taste sensation or these symptoms caused dehydration causing AKI. Improved with IV fluids, arguing for prerenal etiology.  - Pt of Dr. Elissa Hefty who has very clearly stated she doesn't want dialysis and would not be a candidate. Continue to renally dose medications, encourage adequate po, and avoid hypotension/nephrotoxins.   Failure to thrive: Multifactorial with pain, limited mobility due to ankle fracture, renal failure, and advanced age/decreased physiologic reserve.  - Continue PT at ALF - Palliative care consulted. Goals of care discussions by myself and them with the patient's POA as well as the patient herself and her grandson, who will likely become POA. For now services are declined as goals of care are well delineated and patient wishes for family to help with MOST form.   Reported history of dementia: I did not find the patient to exhibit severe symptoms of dementia. She seemed able to take place in conversations regarding her health, though required very loud speaking voice due to hard of hearing. Family also contests this diagnosis.   Closed, nondisplaced spiral fracture of right tibia: s/p fall  01/04/2018: Confirmed on repeat XR on admission.  - Splinted, continue PT and supportive measures. Pain is controlled on tylenol alone  Nausea: Acute on chronic, denies abdominal pain, exam benign. No vomiting since arrival and has tolerated advancing diet.  Patient and daughter state she's not taking much by mouth due to dislike of the food at ALF. Also denies dysphagia and not felt to be significant aspiration risk by SLP.  - Continue acid suppression for GERD  Colonization of urinary tract: Dirty catch urine has grown GNR's on culture. Patient denies all urinary symptoms, afebrile without leukocytosis without antibiotics. Will not start abx at this time.   Hypokalemia: Likely due to poor per oral intake. Seems to have chronic hyperkalemia for which patiromer is prescribed. Will hold this for now pending recheck this week.  - Resolved with replacement.   History of CVA:  - Continue plavix, not on statin  Seizure disorder:  - Continue depakote  HTN: Plan to avoid hypotension. MAP has varied from 62 - 77 despite holding medications. Will continue to hold lasix and hydralazine with the patients recently decreased per oral intake and it seems the indication for lasix was venous insufficiency.   Anemia, presumed to be due to chronic disease: Normocytic. Not far from baseline.  - Monitor intermittently  Constipation: Benign exam, limited po intake also contributing.  - Continue daily bowel regimen. Imodium listed as a scheduled medication on MAR. This was discontinued.  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 Has not had syncope. TSH wnl, ECG captured only chronic findings. Discussed with cardiology, Dr. Johnsie Cancel, who recommended conservative management/avoidance of nodal blocking agents. This was discussed with the patient and her grandson who agree that a pacemaker is not in her best interests/within her goals of care.   Discharge Instructions Discharge Instructions    Diet general   Complete by:  As directed    Increase activity slowly   Complete by:  As directed       Allergies as of 02/14/2018      Reactions   Procaine Hcl Shortness Of Breath, Palpitations, Other (See Comments)   "Reaction to shot at dentist office w/ tachycardia \\T \ SOB (? if really from combination w/epi)"  <<??       Medication List    STOP taking these medications   ammonium lactate 12 % cream Commonly known as:  AMLACTIN   fluticasone 0.05 % cream Commonly known as:  CUTIVATE   furosemide 40 MG tablet Commonly known as:  LASIX   hydrALAZINE 25 MG tablet Commonly known as:  APRESOLINE   hydrocortisone 1 % ointment   loperamide 2 MG tablet Commonly known as:  IMODIUM A-D     TAKE these medications   acetaminophen 500 MG tablet Commonly known as:  TYLENOL Take 1,000 mg by mouth See admin instructions. Take 1,000 mg by mouth three times a day and an additional 1,000 mg every 8 hours as needed for pain   cetirizine 10 MG tablet Commonly known as:  ZYRTEC Take 10 mg by mouth daily.   clopidogrel 75 MG tablet Commonly known as:  PLAVIX Take 1 tablet (75 mg total) by mouth daily. RESTART THIS IN 3 DAYS What changed:  additional instructions   cyanocobalamin 1000 MCG/ML injection Commonly known as:  (VITAMIN B-12) Inject 1,000 mcg into the skin every 30 (thirty) days.  On the 12th of every month.   divalproex 125 MG capsule Commonly known as:  DEPAKOTE SPRINKLE 1 tab po qhs What changed:    how much to take  how to take this  when to take this  additional instructions   multivitamins with iron Tabs tablet Take 1 tablet by mouth daily.   ondansetron 4 MG tablet Commonly known as:  ZOFRAN Take 4 mg by mouth every 12 (twelve) hours as needed for nausea.   oxybutynin 5 MG 24 hr tablet Commonly known as:  DITROPAN-XL Take 5 mg by mouth at bedtime.   patiromer 8.4 g packet Commonly known as:  VELTASSA Take 1 packet (8.4 g total) by mouth daily. Do not give within 3 hours of taking any other medications.  May give with OR without food. What changed:     how much to take  how to take this  when to take this  additional instructions   ranitidine 75 MG tablet Commonly known as:  ZANTAC Take 75 mg by mouth daily.   senna-docusate 8.6-50 MG tablet Commonly known as:  Senokot-S Take 1 tablet by mouth 2 (two) times daily as needed for mild constipation or moderate constipation.   traMADol 50 MG tablet Commonly known as:  ULTRAM Take 1 tablet (50 mg total) by mouth every 6 (six) hours as needed for severe pain. What changed:  reasons to take this   vitamin C 500 MG tablet Commonly known as:  ASCORBIC ACID Take 500 mg by mouth daily.      Follow-up Information    McGowen, Adrian Blackwater, MD. Go to.   Specialty:  Family Medicine Contact information: [address] [place] [address] [phone]          Allergies  Allergen Reactions  . Procaine Hcl Shortness Of Breath, Palpitations and Other (See Comments)    "Reaction to shot at dentist office w/ tachycardia \\T \ SOB (? if really from combination w/epi)"  <<??     Consultations:  Palliative care  Procedures/Studies: Dg Ankle Complete Left  Result Date: 02/12/2018 CLINICAL DATA:  Left ankle pain and swelling EXAM: LEFT ANKLE COMPLETE - 3+ VIEW COMPARISON:  01/04/2018 ankle radiographs. FINDINGS: A nondisplaced spiral oblique distal left diaphyseal fracture has declared itself since prior with minimal sclerosis along the fracture line now noted, suggesting it is subacute nature. Persistent soft tissue swelling of the included left leg, ankle and dorsum of foot. Severe osteopenic appearance of the ankle may further limit assessment in the detection of additional nondisplaced fractures. No joint dislocation. Calcaneal enthesopathy is noted. IMPRESSION: A nondisplaced subacute spiral fracture of the tibial diaphysis has declared itself since prior likely contributing to the diffuse soft tissue swelling of the included leg, ankle and foot. No additional fractures are  identified though detection can be limited due to the marked osteopenic appearance of the bones. No joint dislocation. Electronically Signed   By: Ashley Royalty M.D.   On: 02/12/2018 18:29   Dg Abdomen Acute W/chest  Result Date: 02/12/2018 CLINICAL DATA:  Nausea vomiting, weakness and decreased appetite. EXAM: DG ABDOMEN ACUTE W/ 1V CHEST COMPARISON:  None. FINDINGS: There is no evidence of dilated bowel loops or free intraperitoneal air. No radiopaque calculi or other significant radiographic abnormality is seen. Enlarged cardiac silhouette. Calcific atherosclerotic disease of the aorta. Both lungs are clear. IMPRESSION: Negative abdominal radiographs. No acute cardiopulmonary disease. Enlarged cardiac silhouette with calcific atherosclerotic disease of the aorta. Electronically Signed   By:  Fidela Salisbury M.D.   On: 02/12/2018 16:02     Subjective: Feels well, eating better. Did well with tomato soup which she loves. Having a lot more gas. UOP is good. Left ankle pain is minimal/none. Has not taken tramadol at all.   Discharge Exam: Vitals:   02/14/18 0437 02/14/18 1207  BP: (!) 117/44 (!) 137/53  Pulse: (!) 54 (!) 56  Resp: 14 16  Temp: 97.6 F (36.4 C) 97.6 F (36.4 C)  SpO2: 98% 100%   General: Pleasant elderly very HOH female Cardiovascular: RRR, S1/S2 +, no rubs, no gallops Respiratory: CTA bilaterally, no wheezing, no rhonchi Abdominal: Soft, NT, ND, bowel sounds + Extremities: LLE with splint from ankle near to the knee. Distal sensorimotor exam normal with brisk cap refill. No edema, no cyanosis  Labs: BNP (last 3 results) Recent Labs    10/28/17 1828  BNP 510.2*   Basic Metabolic Panel: Recent Labs  Lab 02/12/18 1535 02/13/18 0141 02/14/18 0509  NA 140 140 140  K 3.2* 3.6 3.8  CL 106 111 114*  CO2 19* 19* 20*  GLUCOSE 98 132* 109*  BUN 117* 106* 95*  CREATININE 3.70* 3.19* 2.64*  CALCIUM 9.3 8.7* 8.8*   Liver Function Tests: Recent Labs  Lab  02/12/18 1535  AST 13*  ALT 12  ALKPHOS 98  BILITOT 0.9  PROT 5.7*  ALBUMIN 2.8*   No results for input(s): LIPASE, AMYLASE in the last 168 hours. No results for input(s): AMMONIA in the last 168 hours. CBC: Recent Labs  Lab 02/12/18 1535 02/13/18 0141  WBC 7.8 6.0  NEUTROABS 6.4  --   HGB 9.5* 8.9*  HCT 30.7* 28.6*  MCV 99.0 97.6  PLT 197 184   Cardiac Enzymes: No results for input(s): CKTOTAL, CKMB, CKMBINDEX, TROPONINI in the last 168 hours. BNP: Invalid input(s): POCBNP CBG: No results for input(s): GLUCAP in the last 168 hours. D-Dimer No results for input(s): DDIMER in the last 72 hours. Hgb A1c No results for input(s): HGBA1C in the last 72 hours. Lipid Profile No results for input(s): CHOL, HDL, LDLCALC, TRIG, CHOLHDL, LDLDIRECT in the last 72 hours. Thyroid function studies Recent Labs    02/14/18 0737  TSH 0.766   Anemia work up No results for input(s): VITAMINB12, FOLATE, FERRITIN, TIBC, IRON, RETICCTPCT in the last 72 hours. Urinalysis    Component Value Date/Time   COLORURINE YELLOW 02/12/2018 1725   APPEARANCEUR CLOUDY (A) 02/12/2018 1725   LABSPEC 1.012 02/12/2018 1725   PHURINE 5.0 02/12/2018 1725   GLUCOSEU NEGATIVE 02/12/2018 1725   GLUCOSEU NEGATIVE 01/06/2012 1620   HGBUR NEGATIVE 02/12/2018 1725   BILIRUBINUR NEGATIVE 02/12/2018 1725   KETONESUR 5 (A) 02/12/2018 1725   PROTEINUR NEGATIVE 02/12/2018 1725   UROBILINOGEN 0.2 01/06/2012 1620   NITRITE NEGATIVE 02/12/2018 1725   LEUKOCYTESUR MODERATE (A) 02/12/2018 1725    Microbiology Recent Results (from the past 240 hour(s))  Urine culture     Status: Abnormal (Preliminary result)   Collection Time: 02/12/18  5:25 PM  Result Value Ref Range Status   Specimen Description URINE, CLEAN CATCH  Final   Special Requests   Final    NONE Performed at [facility], [place] [address], [place], [place] [address]    Culture >=100,000 COLONIES/mL GRAM NEGATIVE RODS (A)  Final   Report  Status PENDING  Incomplete    Time coordinating discharge: Approximately 40 minutes  Patrecia Pour, MD  [facility] 02/14/2018, 1:54 PM Pager  [phone]  

## 2018-02-14 NOTE — Care Management Note (Signed)
Case Management Note  Patient Details  Name: Carrie Grimes MRN: [id number] Date of Birth: 07/28/1920  Subjective/Objective:             Pt from [place] [facility] memory care for acute kidney injury.  Pt in OBS at this time.  Daughter would like for patient to return to ALF with physical therapy.     Action/Plan: Dian Situ with [facility] advised of pt dc.   Expected Discharge Date:  02/14/18               Expected Discharge Plan:  Assisted Living / Rest Home  In-House Referral:  Clinical Social Work  Discharge planning Services  CM Consult  Post Acute Care Choice:  Home Health Choice offered to:  Patient, Adult Children  DME Arranged:  N/A DME Agency:  NA  HH Arranged:  PT HH Agency:  [facility]  Status of Service:  Completed, signed off  If discussed at [place] of Stay Meetings, dates discussed:    Additional Comments:  Claudie Leach, RN 02/14/2018, 3:35 PM

## 2018-02-14 NOTE — Plan of Care (Signed)
  Problem: Education: Goal: Knowledge of General Education information will improve Description Including pain rating scale, medication(s)/side effects and non-pharmacologic comfort measures 02/14/2018 1719 by Rance Muir, RN Outcome: Adequate for Discharge 02/14/2018 1104 by Rance Muir, RN Outcome: Progressing   Problem: Health Behavior/Discharge Planning: Goal: Ability to manage health-related needs will improve 02/14/2018 1719 by Rance Muir, RN Outcome: Adequate for Discharge 02/14/2018 1104 by Rance Muir, RN Outcome: Progressing   Problem: Clinical Measurements: Goal: Ability to maintain clinical measurements within normal limits will improve 02/14/2018 1719 by Rance Muir, RN Outcome: Adequate for Discharge 02/14/2018 1104 by Rance Muir, RN Outcome: Progressing Goal: Will remain free from infection 02/14/2018 1719 by Rance Muir, RN Outcome: Adequate for Discharge 02/14/2018 1104 by Rance Muir, RN Outcome: Progressing Goal: Diagnostic test results will improve 02/14/2018 1719 by Rance Muir, RN Outcome: Adequate for Discharge 02/14/2018 1104 by Rance Muir, RN Outcome: Progressing Goal: Respiratory complications will improve 02/14/2018 1719 by Rance Muir, RN Outcome: Adequate for Discharge 02/14/2018 1104 by Rance Muir, RN Outcome: Progressing Goal: Cardiovascular complication will be avoided 02/14/2018 1719 by Rance Muir, RN Outcome: Adequate for Discharge 02/14/2018 1104 by Rance Muir, RN Outcome: Progressing   Problem: Activity: Goal: Risk for activity intolerance will decrease 02/14/2018 1719 by Rance Muir, RN Outcome: Adequate for Discharge 02/14/2018 1104 by Rance Muir, RN Outcome: Progressing   Problem: Activity: Goal: Risk for activity intolerance will decrease 02/14/2018 1719 by Rance Muir, RN Outcome: Adequate for Discharge 02/14/2018 1104 by Rance Muir, RN Outcome: Progressing   Problem: Nutrition: Goal: Adequate nutrition will be maintained 02/14/2018 1719 by Rance Muir, RN Outcome: Adequate for Discharge 02/14/2018 1104 by Rance Muir, RN Outcome: Progressing   Problem: Elimination: Goal: Will not experience complications related to bowel motility 02/14/2018 1719 by Rance Muir, RN Outcome: Adequate for Discharge 02/14/2018 1104 by Rance Muir, RN Outcome: Progressing Goal: Will not experience complications related to urinary retention 02/14/2018 1719 by Rance Muir, RN Outcome: Adequate for Discharge 02/14/2018 1104 by Rance Muir, RN Outcome: Progressing   Problem: Pain Managment: Goal: General experience of comfort will improve 02/14/2018 1719 by Rance Muir, RN Outcome: Adequate for Discharge 02/14/2018 1104 by Rance Muir, RN Outcome: Progressing   Problem: Safety: Goal: Ability to remain free from injury will improve 02/14/2018 1719 by Rance Muir, RN Outcome: Adequate for Discharge 02/14/2018 1104 by Rance Muir, RN Outcome: Progressing   Problem: Skin Integrity: Goal: Risk for impaired skin integrity will decrease 02/14/2018 1719 by Rance Muir, RN Outcome: Adequate for Discharge 02/14/2018 1104 by Rance Muir, RN Outcome: Progressing

## 2018-02-14 NOTE — Plan of Care (Signed)

## 2018-02-14 NOTE — Care Management CC44 (Signed)
Condition Code 44 Documentation Completed  Patient Details  Name: Carrie Grimes MRN: [id number] Date of Birth: [birth year]   Condition Code 44 given:  Yes Patient signature on Condition Code 44 notice:  Yes Documentation of 2 MD's agreement:  Yes Code 44 added to claim:  Yes  CC44 ordered last night around 2100.  Pt has dementia.  Discussed with Hassan Rowan, daughter, over the phone.  Copy left in patient's room.    Claudie Leach, RN 02/14/2018, 9:46 AM

## 2018-02-14 NOTE — Progress Notes (Signed)
Clinical Social Worker facilitated patient discharge including contacting patient family and facility to confirm patient discharge plans.  Clinical information faxed to facility and family agreeable with plan.  CSW arranged ambulance transport via [facility] to [facility] to call [phone] (ask for med teach [place]) for report prior to discharge.  Clinical Social Worker will sign off for now as social work intervention is no longer needed. Please consult [place] again if new need arises.  Rhea Pink, MSW, [place]

## 2018-02-14 NOTE — Progress Notes (Signed)
Attempted to call [facility] several times to give report.  Was unable to connect with an actual person, was only able to reach a general voice mailbox.  Message was left with my contact number. Patient transferred at this time via [facility]

## 2018-02-15 LAB — URINE CULTURE: Culture: >=100000 — AB

## 2018-02-17 DIAGNOSIS — D510 Vitamin B12 deficiency anemia due to intrinsic factor deficiency: Secondary | ICD-10-CM | POA: Diagnosis not present

## 2018-02-17 DIAGNOSIS — Z7902 Long term (current) use of antithrombotics/antiplatelets: Secondary | ICD-10-CM | POA: Diagnosis not present

## 2018-02-17 DIAGNOSIS — L97211 Non-pressure chronic ulcer of right calf limited to breakdown of skin: Secondary | ICD-10-CM | POA: Diagnosis not present

## 2018-02-17 DIAGNOSIS — I129 Hypertensive chronic kidney disease with stage 1 through stage 4 chronic kidney disease, or unspecified chronic kidney disease: Secondary | ICD-10-CM | POA: Diagnosis not present

## 2018-02-17 DIAGNOSIS — I872 Venous insufficiency (chronic) (peripheral): Secondary | ICD-10-CM | POA: Diagnosis not present

## 2018-02-17 DIAGNOSIS — N183 Chronic kidney disease, stage 3 (moderate): Secondary | ICD-10-CM | POA: Diagnosis not present

## 2018-02-17 DIAGNOSIS — Z4800 Encounter for change or removal of nonsurgical wound dressing: Secondary | ICD-10-CM | POA: Diagnosis not present

## 2018-02-17 DIAGNOSIS — Z8673 Personal history of transient ischemic attack (TIA), and cerebral infarction without residual deficits: Secondary | ICD-10-CM | POA: Diagnosis not present

## 2018-02-17 DIAGNOSIS — Z79899 Other long term (current) drug therapy: Secondary | ICD-10-CM | POA: Diagnosis not present

## 2018-02-20 DIAGNOSIS — D510 Vitamin B12 deficiency anemia due to intrinsic factor deficiency: Secondary | ICD-10-CM | POA: Diagnosis not present

## 2018-02-20 DIAGNOSIS — Z4800 Encounter for change or removal of nonsurgical wound dressing: Secondary | ICD-10-CM | POA: Diagnosis not present

## 2018-02-20 DIAGNOSIS — Z8673 Personal history of transient ischemic attack (TIA), and cerebral infarction without residual deficits: Secondary | ICD-10-CM | POA: Diagnosis not present

## 2018-02-20 DIAGNOSIS — Z79899 Other long term (current) drug therapy: Secondary | ICD-10-CM | POA: Diagnosis not present

## 2018-02-20 DIAGNOSIS — Z7902 Long term (current) use of antithrombotics/antiplatelets: Secondary | ICD-10-CM | POA: Diagnosis not present

## 2018-02-20 DIAGNOSIS — L97211 Non-pressure chronic ulcer of right calf limited to breakdown of skin: Secondary | ICD-10-CM | POA: Diagnosis not present

## 2018-02-20 DIAGNOSIS — I129 Hypertensive chronic kidney disease with stage 1 through stage 4 chronic kidney disease, or unspecified chronic kidney disease: Secondary | ICD-10-CM | POA: Diagnosis not present

## 2018-02-20 DIAGNOSIS — N183 Chronic kidney disease, stage 3 (moderate): Secondary | ICD-10-CM | POA: Diagnosis not present

## 2018-02-20 DIAGNOSIS — I872 Venous insufficiency (chronic) (peripheral): Secondary | ICD-10-CM | POA: Diagnosis not present

## 2018-02-23 ENCOUNTER — Other Ambulatory Visit (HOSPITAL_COMMUNITY)
Admission: RE | Admit: 2018-02-23 | Discharge: 2018-02-23 | Disposition: A | Discharge status: Home / Self Care | Payer: Medicare Other | Source: Ambulatory Visit | Attending: Pathology | Admitting: Pathology

## 2018-02-23 DIAGNOSIS — Z79899 Other long term (current) drug therapy: Secondary | ICD-10-CM | POA: Diagnosis not present

## 2018-02-23 DIAGNOSIS — N183 Chronic kidney disease, stage 3 (moderate): Secondary | ICD-10-CM | POA: Insufficient documentation

## 2018-02-23 DIAGNOSIS — I872 Venous insufficiency (chronic) (peripheral): Secondary | ICD-10-CM | POA: Diagnosis not present

## 2018-02-23 DIAGNOSIS — I129 Hypertensive chronic kidney disease with stage 1 through stage 4 chronic kidney disease, or unspecified chronic kidney disease: Secondary | ICD-10-CM | POA: Insufficient documentation

## 2018-02-23 DIAGNOSIS — Z7902 Long term (current) use of antithrombotics/antiplatelets: Secondary | ICD-10-CM | POA: Diagnosis not present

## 2018-02-23 DIAGNOSIS — Z8673 Personal history of transient ischemic attack (TIA), and cerebral infarction without residual deficits: Secondary | ICD-10-CM | POA: Diagnosis not present

## 2018-02-23 DIAGNOSIS — D510 Vitamin B12 deficiency anemia due to intrinsic factor deficiency: Secondary | ICD-10-CM | POA: Diagnosis not present

## 2018-02-23 DIAGNOSIS — L97211 Non-pressure chronic ulcer of right calf limited to breakdown of skin: Secondary | ICD-10-CM | POA: Diagnosis not present

## 2018-02-23 DIAGNOSIS — Z4800 Encounter for change or removal of nonsurgical wound dressing: Secondary | ICD-10-CM | POA: Diagnosis not present

## 2018-02-23 LAB — BASIC METABOLIC PANEL
Anion gap: 8 (ref 5–15)
BUN: 62 mg/dL — AB (ref 8–23)
CO2: 22 mmol/L (ref 22–32)
CREATININE: 2.13 mg/dL — AB (ref 0.44–1.00)
Calcium: 9.4 mg/dL (ref 8.9–10.3)
Chloride: 111 mmol/L (ref 98–111)
GFR calc Af Amer: 22 mL/min — ABNORMAL LOW (ref >60)
GFR, EST NON AFRICAN AMERICAN: 19 mL/min — AB (ref >60)
Glucose, Bld: 123 mg/dL — ABNORMAL HIGH (ref 70–99)
Potassium: 4.5 mmol/L (ref 3.5–5.1)
SODIUM: 141 mmol/L (ref 135–145)

## 2018-02-25 ENCOUNTER — Encounter: Payer: Self-pay | Admitting: *Deleted

## 2018-02-25 DIAGNOSIS — Z7902 Long term (current) use of antithrombotics/antiplatelets: Secondary | ICD-10-CM | POA: Diagnosis not present

## 2018-02-25 DIAGNOSIS — N183 Chronic kidney disease, stage 3 (moderate): Secondary | ICD-10-CM | POA: Diagnosis not present

## 2018-02-25 DIAGNOSIS — Z79899 Other long term (current) drug therapy: Secondary | ICD-10-CM | POA: Diagnosis not present

## 2018-02-25 DIAGNOSIS — D510 Vitamin B12 deficiency anemia due to intrinsic factor deficiency: Secondary | ICD-10-CM | POA: Diagnosis not present

## 2018-02-25 DIAGNOSIS — Z4800 Encounter for change or removal of nonsurgical wound dressing: Secondary | ICD-10-CM | POA: Diagnosis not present

## 2018-02-25 DIAGNOSIS — Z8673 Personal history of transient ischemic attack (TIA), and cerebral infarction without residual deficits: Secondary | ICD-10-CM | POA: Diagnosis not present

## 2018-02-25 DIAGNOSIS — I872 Venous insufficiency (chronic) (peripheral): Secondary | ICD-10-CM | POA: Diagnosis not present

## 2018-02-25 DIAGNOSIS — I129 Hypertensive chronic kidney disease with stage 1 through stage 4 chronic kidney disease, or unspecified chronic kidney disease: Secondary | ICD-10-CM | POA: Diagnosis not present

## 2018-02-25 DIAGNOSIS — L97211 Non-pressure chronic ulcer of right calf limited to breakdown of skin: Secondary | ICD-10-CM | POA: Diagnosis not present

## 2018-02-26 ENCOUNTER — Encounter: Payer: Self-pay | Admitting: Family Medicine

## 2018-02-26 DIAGNOSIS — M79605 Pain in left leg: Secondary | ICD-10-CM | POA: Diagnosis not present

## 2018-02-26 DIAGNOSIS — Z7409 Other reduced mobility: Secondary | ICD-10-CM | POA: Diagnosis not present

## 2018-02-26 DIAGNOSIS — R5381 Other malaise: Secondary | ICD-10-CM | POA: Diagnosis not present

## 2018-02-26 DIAGNOSIS — S82245A Nondisplaced spiral fracture of shaft of left tibia, initial encounter for closed fracture: Secondary | ICD-10-CM | POA: Diagnosis not present

## 2018-03-05 ENCOUNTER — Encounter: Payer: Self-pay | Admitting: Family Medicine

## 2018-03-05 ENCOUNTER — Ambulatory Visit (INDEPENDENT_AMBULATORY_CARE_PROVIDER_SITE_OTHER): Payer: Medicare Other | Admitting: Family Medicine

## 2018-03-05 VITALS — BP 138/70 | HR 58 | Resp 16 | Wt 99.0 lb

## 2018-03-05 DIAGNOSIS — N184 Chronic kidney disease, stage 4 (severe): Secondary | ICD-10-CM | POA: Diagnosis not present

## 2018-03-05 DIAGNOSIS — I10 Essential (primary) hypertension: Secondary | ICD-10-CM | POA: Diagnosis not present

## 2018-03-05 DIAGNOSIS — E875 Hyperkalemia: Secondary | ICD-10-CM | POA: Diagnosis not present

## 2018-03-05 DIAGNOSIS — R609 Edema, unspecified: Secondary | ICD-10-CM

## 2018-03-05 LAB — BASIC METABOLIC PANEL
BUN: 73 mg/dL — AB (ref 6–23)
CALCIUM: 9.4 mg/dL (ref 8.4–10.5)
CHLORIDE: 111 meq/L (ref 96–112)
CO2: 26 mEq/L (ref 19–32)
CREATININE: 2.2 mg/dL — AB (ref 0.40–1.20)
GFR: 22.01 mL/min — AB (ref >60.00)
Glucose, Bld: 101 mg/dL — ABNORMAL HIGH (ref 70–99)
Potassium: 5.7 mEq/L — ABNORMAL HIGH (ref 3.5–5.1)
Sodium: 141 mEq/L (ref 135–145)

## 2018-03-05 NOTE — Progress Notes (Signed)
03/05/2018  CC:  Chief Complaint  Patient presents with  . Hospitalization Follow-up    Patient is a 84 y.o. Caucasian female who presents for  hospital follow up. Dates hospitalized: 10/17-10/19, 2019. Days since d/c from hospital: 19 days Patient was discharged from hospital to her ALF. Reason for admission to hospital: FTT, diarrhea, recent L ankle injury (initially dx'd as a sprain due to neg x-ray, then an x-ray at later date showed spiral fx).  Dx'd with AKI, started on IVFs.   I have reviewed patient's discharge summary plus pertinent specific notes, labs, and imaging from the hospitalization.    Feeling well now, no acute complaints.  Is in a brace on LLL, has f/u with Dr. Delilah Shan regarding LLL fx. She states she is not in any pain.  Denies SOB, CP, abd pain, or HA.  Medication reconciliation was done today and patient is taking meds as recommended by discharging hospitalist/specialist.  (She went home on NO lasix or hydralazine and her veltassa dosing is now 1 packet bid).  PMH:  Past Medical History:  Diagnosis Date  . Allergic rhinitis, cause unspecified   . Anemia of chronic kidney failure, stage 4 (severe) (HCC)    Aranesp via nephrologist  . Cataracts, bilateral   . Cerebrovascular disease, unspecified    CDopplers 1/13 showed mild mixed plaque w/ 40-59% (low end) bilat ICA stenoses, the prev right CAE & patch angioplasty were patent  . Choledocholithiasis    12/11/14 CT--nonobstructive (55mm x 6 mm)  . Chronic renal insufficiency, stage IV (severe) (HCC)    Dr. Marval Regal. Not a candidate for renal replacement therapy.  Pt does not want this, either.  . Dementia with behavioral disturbance ([place])    depakote helpful  . Diaphragmatic hernia without mention of obstruction or gangrene   . Essential hypertension   . Hyperkalemia 06/12/2015   diminished renal excretion: as of 05/2016, kayexolate started, low K diet.  Kayexalate changed to veltassa 2019, veltassa  increased to 2 packets qd---potassium goal 5.7 or less.  . Impaired mobility    uses walker, wheelchair  . Pernicious anemia    + amemia of CRI  . Pure hypercholesterolemia   . RBBB   . Retinal hemorrhage of right eye   . Tibial fracture 12/2017   Nondisplaced, spiral.  Initial radiographs NEG for fracture.  Marland Kitchen Unspecified cerebral artery occlusion with cerebral infarction    MRI Brain 12/12 showed large remote right hemispheric infarct w/ encephalomalacia w/ prominent small vessel dis superimposed on atrophy... (residual deficits are diminished sensation on left side, left hand grip weakness,   . Unspecified disorder resulting from impaired renal function    hyperkalemia and anemia  . Unspecified hearing loss   . Unspecified venous (peripheral) insufficiency    lasix  . Unspecified vitamin D deficiency     PSH:  Past Surgical History:  Procedure Laterality Date  . CAROTID ENDARTERECTOMY    . CHOLECYSTECTOMY    . ENDOSCOPIC RETROGRADE CHOLANGIOPANCREATOGRAPHY (ERCP) WITH PROPOFOL N/A 03/09/2015   Procedure: ENDOSCOPIC RETROGRADE CHOLANGIOPANCREATOGRAPHY (ERCP) WITH PROPOFOL;  Surgeon: Milus Banister, MD;  Location: [facility];  Service: Endoscopy;  Laterality: N/A;  . laser surgery on right eye  12/02/2012   Dr[facility]    MEDS:  Outpatient Medications Prior to Visit  Medication Sig Dispense Refill  . acetaminophen (TYLENOL) 500 MG tablet Take 1,000 mg by mouth See admin instructions. Take 1,000 mg by mouth three times a day and an additional  1,000 mg every 8 hours as needed for pain    . cetirizine (ZYRTEC) 10 MG tablet Take 10 mg by mouth daily.    . clopidogrel (PLAVIX) 75 MG tablet Take 1 tablet (75 mg total) by mouth daily. RESTART THIS IN 3 DAYS (Patient taking differently: Take 75 mg by mouth daily. ) 30 tablet 3  . cyanocobalamin (,VITAMIN B-12,) 1000 MCG/ML injection Inject 1,000 mcg into the skin every 30 (thirty) days. On the 12th of every month.     . divalproex (DEPAKOTE SPRINKLE) 125 MG capsule 1 tab po qhs (Patient taking differently: Take 125 mg by mouth at bedtime. ) 30 capsule 0  . Multiple Vitamins-Iron (MULTIVITAMINS WITH IRON) TABS tablet Take 1 tablet by mouth daily.    . ondansetron (ZOFRAN) 4 MG tablet Take 4 mg by mouth every 12 (twelve) hours as needed for nausea.    Marland Kitchen oxybutynin (DITROPAN-XL) 5 MG 24 hr tablet Take 5 mg by mouth at bedtime.     . patiromer (VELTASSA) 8.4 g packet Take 1 packet (8.4 g total) by mouth daily. Do not give within 3 hours of taking any other medications.  May give with OR without food.    . ranitidine (ZANTAC) 75 MG tablet Take 75 mg by mouth daily.    Marland Kitchen senna-docusate (SENOKOT-S) 8.6-50 MG tablet Take 1 tablet by mouth 2 (two) times daily as needed for mild constipation or moderate constipation.    . traMADol (ULTRAM) 50 MG tablet Take 1 tablet (50 mg total) by mouth every 6 (six) hours as needed for severe pain. 6 tablet 0  . vitamin C (ASCORBIC ACID) 500 MG tablet Take 500 mg by mouth daily.      No facility-administered medications prior to visit.   EXAM: BP 138/70 (BP Location: Left Arm, Patient Position: Sitting, Cuff Size: Normal)   Pulse (!) 58   Resp 16   Wt 99 lb (44.9 kg)   SpO2 97%   BMI 15.51 kg/m  Gen: alert, pleasant, talkative. Oral mucosa moist/pink. CV: RRR (65 by me), 9-3/9 systolic murmur, no rub. LUNGS CTA bilat, with nonlabored resps. EXT: R LL w/out any edema. L LL with splint on and I did not remove it today.  She has 4+ pitting in the portion of the L LL that is visualized.  Pertinent labs/imaging   Chemistry      Component Value Date/Time   NA 141 02/23/2018 0900   NA 141 09/09/2017   K 4.5 02/23/2018 0900   CL 111 02/23/2018 0900   CO2 22 02/23/2018 0900   BUN 62 (H) 02/23/2018 0900   BUN 66 (A) 09/09/2017   CREATININE 2.13 (H) 02/23/2018 0900   CREATININE 2.67 (H) 02/05/2018 1438   GLU 105 09/09/2017      Component Value Date/Time   CALCIUM 9.4  02/23/2018 0900   ALKPHOS 98 02/12/2018 1535   AST 13 (L) 02/12/2018 1535   ALT 12 02/12/2018 1535   BILITOT 0.9 02/12/2018 1535     GFR = 22 ml/min on 02/23/18 (10 d/c s/p d/c from hosp)  ASSESSMENT/PLAN:  Hosp f/u:  1) FTT: eating/drinking much better.  She does feel better not taking lasix and so much veltassa.  2) Chronic L>>>R lower extremity edema: stable off lasix. Renal function at baseline at last check about a week after getting out of hospital. Yoe BMET today.  3) CRI stage 4, with hyperkalemia: Continue w/out lasix for now, also no hydralazine since bp is fine. Leave  veltassa dose at 1 packet bid. BMET today.  An After Visit Summary was printed and given to the patient.  FOLLOW UP:  3 mo  Signed:  Crissie Sickles, MD           03/05/2018

## 2018-03-09 ENCOUNTER — Encounter: Payer: Self-pay | Admitting: *Deleted

## 2018-03-10 ENCOUNTER — Telehealth: Payer: Self-pay | Admitting: Family Medicine

## 2018-03-10 NOTE — Telephone Encounter (Signed)
Copied from [place] [phone]. Topic: Quick Communication - See Telephone Encounter >> Mar 10, 2018  2:28 PM Sheran Luz wrote: CRM for notification. See Telephone encounter for: 03/10/18.  Stephanie Martinique NP with [facility] states that she has met with patient and has determined patient meets qualification for hospice. [facility] [place] is requesting VO for hospice.   Cb# [phone] Secure VM- OK to leave message

## 2018-03-11 NOTE — Telephone Encounter (Signed)
Copied from [place] [id number]. Topic: Quick Communication - See Telephone Encounter >> Mar 10, 2018  2:28 PM Sheran Luz wrote: CRM for notification. See Telephone encounter for: 03/10/18.  Stephanie Martinique NP with [facility] states that she has met with patient and has determined patient meets qualification for hospice. [facility] [place] is requesting VO for hospice.   Cb# [phone] Secure VM- OK to leave message >> Mar 11, 2018 10:24 AM Ivar Drape wrote: Stephanie Martinique [place].  So she really needs a quick verbal order for this.  Please call [phone] and they can give the number to fax the order to or they can take a verbal from the nurse.

## 2018-03-11 NOTE — Telephone Encounter (Signed)
Please advise. Thanks.  

## 2018-03-11 NOTE — Telephone Encounter (Signed)
SW Dr. Anitra Lauth okay for hospice care.  Called [phone] and [facility] [place], she took verbal order and will get everything started.

## 2018-03-11 NOTE — Telephone Encounter (Signed)
Agree with admission to hospice.

## 2018-03-17 ENCOUNTER — Ambulatory Visit: Payer: Medicaid Other | Admitting: Podiatry

## 2018-03-19 DIAGNOSIS — S82245A Nondisplaced spiral fracture of shaft of left tibia, initial encounter for closed fracture: Secondary | ICD-10-CM | POA: Diagnosis not present

## 2018-03-28 DIAGNOSIS — R5381 Other malaise: Secondary | ICD-10-CM | POA: Diagnosis not present

## 2018-03-28 DIAGNOSIS — Z7409 Other reduced mobility: Secondary | ICD-10-CM | POA: Diagnosis not present

## 2018-04-10 ENCOUNTER — Telehealth: Payer: Self-pay | Admitting: Family Medicine

## 2018-04-10 ENCOUNTER — Telehealth: Payer: Self-pay | Admitting: *Deleted

## 2018-04-10 NOTE — Telephone Encounter (Signed)
Copied from CoverMyMeds:  Message from Plan This medication or product is on your plan's list of covered drugs. Prior authorization is not required at this time. If your pharmacy has questions regarding the processing of your prescription, please have them call the [facility] pharmacy help desk at [phone]. **Please note: Formulary lowering, tiering exception, cost reduction and prospective Medicare hospice reviews cannot be requested using this method of submission. Please contact us at [phone] instead.

## 2018-04-10 NOTE — Telephone Encounter (Signed)
PA sent via covermymed on 04/10/18.   Key: [id number]   Medication: Veltassa   Dx: Hyperkalemia   Per Dr. Anitra Lauth pt has tried and failed: Kayexalate    Waiting for response.

## 2018-04-10 NOTE — Telephone Encounter (Signed)
Got message from pt's ALF that diarrhea is still a problem.  She is being given imodium. Her veltassa for her hyperkalemia is the likely cause of her diarrhea. Pls change veltassa to 1 packet ONCE a day. She needs a BMET done in 1 week, dx is hyperkalemia and CRI stage IV. Also, I recommend they give her 1 adult dose of metamucil every night if they are not already doing so. -thx

## 2018-04-10 NOTE — Telephone Encounter (Signed)
Orders faxed to [facility].  Called to schedule lab visit, nurse not available, left message to call back to schedule lab visit for BMP in 1 week.

## 2018-04-13 NOTE — Telephone Encounter (Signed)
Noted  

## 2018-04-13 NOTE — Telephone Encounter (Signed)
[place] at [facility], she stated that they did receive order faxed on 04/10/18 and pt started 1 pk daily on 04/11/18. Pt scheduled for lab visit on 04/16/18 at 8:30am.

## 2018-04-16 ENCOUNTER — Other Ambulatory Visit: Payer: Medicare Other

## 2018-04-21 DIAGNOSIS — S82245A Nondisplaced spiral fracture of shaft of left tibia, initial encounter for closed fracture: Secondary | ICD-10-CM | POA: Diagnosis not present

## 2018-04-28 DIAGNOSIS — Z7409 Other reduced mobility: Secondary | ICD-10-CM | POA: Diagnosis not present

## 2018-04-28 DIAGNOSIS — R5381 Other malaise: Secondary | ICD-10-CM | POA: Diagnosis not present

## 2018-05-06 ENCOUNTER — Encounter: Payer: Self-pay | Admitting: *Deleted

## 2018-05-07 ENCOUNTER — Ambulatory Visit: Payer: Medicare Other | Admitting: Family Medicine

## 2018-05-07 ENCOUNTER — Encounter: Payer: Self-pay | Admitting: Family Medicine

## 2018-05-07 NOTE — Progress Notes (Deleted)
OFFICE VISIT  05/07/2018   CC: No chief complaint on file.    HPI:    Patient is a 85 y.o. Caucasian female with advanced dementia and severe hearing impairment who presents for 3 mo f/u HTN, chronic bilat LL venous insufficiency edema, and CRI IV with hyperkalemia. She has hx of tibia fracture a few months ago.  I reviewed most recent ortho f/u, visit date 04/22/19. Radiograph showed appropriate healing.  Ortho recommended PT.  Pt is wheelchair-bound and really just transfers only.  She was admitted to hospice 03/13/18.  Past Medical History:  Diagnosis Date  . Allergic rhinitis, cause unspecified   . Anemia of chronic kidney failure, stage 4 (severe) (HCC)    Aranesp via nephrologist  . Cataracts, bilateral   . Cerebrovascular disease, unspecified    CDopplers 1/13 showed mild mixed plaque w/ 40-59% (low end) bilat ICA stenoses, the prev right CAE & patch angioplasty were patent  . Choledocholithiasis    12/11/14 CT--nonobstructive (61mm x 6 mm)  . Chronic renal insufficiency, stage IV (severe) (HCC)    Dr. Marval Regal. Not a candidate for renal replacement therapy.  Pt does not want this, either.  . Dementia with behavioral disturbance ([place])    depakote helpful  . Diaphragmatic hernia without mention of obstruction or gangrene   . Essential hypertension   . Hyperkalemia 06/12/2015   diminished renal excretion: as of 05/2016, kayexolate started, low K diet.  Kayexalate changed to veltassa 2019, veltassa increased to 2 packets qd---potassium goal 5.7 or less.  . Impaired mobility    uses walker, wheelchair  . Pernicious anemia    + amemia of CRI  . Pure hypercholesterolemia   . RBBB   . Retinal hemorrhage of right eye   . Tibial fracture 12/2017   Nondisplaced, spiral.  Initial radiographs NEG for fracture.  Marland Kitchen Unspecified cerebral artery occlusion with cerebral infarction    MRI Brain 12/12 showed large remote right hemispheric infarct w/ encephalomalacia w/ prominent small  vessel dis superimposed on atrophy... (residual deficits are diminished sensation on left side, left hand grip weakness,   . Unspecified disorder resulting from impaired renal function    hyperkalemia and anemia  . Unspecified hearing loss   . Unspecified venous (peripheral) insufficiency    lasix  . Unspecified vitamin D deficiency     Past Surgical History:  Procedure Laterality Date  . CAROTID ENDARTERECTOMY    . CHOLECYSTECTOMY    . ENDOSCOPIC RETROGRADE CHOLANGIOPANCREATOGRAPHY (ERCP) WITH PROPOFOL N/A 03/09/2015   Procedure: ENDOSCOPIC RETROGRADE CHOLANGIOPANCREATOGRAPHY (ERCP) WITH PROPOFOL;  Surgeon: Milus Banister, MD;  Location: [facility];  Service: Endoscopy;  Laterality: N/A;  . laser surgery on right eye  12/02/2012   Dr. [facility] at [facility]    Outpatient Medications Prior to Visit  Medication Sig Dispense Refill  . acetaminophen (TYLENOL) 500 MG tablet Take 1,000 mg by mouth See admin instructions. Take 1,000 mg by mouth three times a day and an additional 1,000 mg every 8 hours as needed for pain    . cetirizine (ZYRTEC) 10 MG tablet Take 10 mg by mouth daily.    . clopidogrel (PLAVIX) 75 MG tablet Take 1 tablet (75 mg total) by mouth daily. RESTART THIS IN 3 DAYS (Patient taking differently: Take 75 mg by mouth daily. ) 30 tablet 3  . cyanocobalamin (,VITAMIN B-12,) 1000 MCG/ML injection Inject 1,000 mcg into the skin every 30 (thirty) days. On the 12th of every month.    . divalproex (  DEPAKOTE SPRINKLE) 125 MG capsule 1 tab po qhs (Patient taking differently: Take 125 mg by mouth at bedtime. ) 30 capsule 0  . Multiple Vitamins-Iron (MULTIVITAMINS WITH IRON) TABS tablet Take 1 tablet by mouth daily.    . ondansetron (ZOFRAN) 4 MG tablet Take 4 mg by mouth every 12 (twelve) hours as needed for nausea.    Marland Kitchen oxybutynin (DITROPAN-XL) 5 MG 24 hr tablet Take 5 mg by mouth at bedtime.     . patiromer (VELTASSA) 8.4 g packet Take 1 packet (8.4 g total) by mouth daily.  Do not give within 3 hours of taking any other medications.  May give with OR without food.    . ranitidine (ZANTAC) 75 MG tablet Take 75 mg by mouth daily.    Marland Kitchen senna-docusate (SENOKOT-S) 8.6-50 MG tablet Take 1 tablet by mouth 2 (two) times daily as needed for mild constipation or moderate constipation.    . traMADol (ULTRAM) 50 MG tablet Take 1 tablet (50 mg total) by mouth every 6 (six) hours as needed for severe pain. 6 tablet 0  . vitamin C (ASCORBIC ACID) 500 MG tablet Take 500 mg by mouth daily.      No facility-administered medications prior to visit.     Allergies  Allergen Reactions  . Procaine Hcl Shortness Of Breath, Palpitations and Other (See Comments)    "Reaction to shot at dentist office w/ tachycardia \\T \ SOB (? if really from combination w/epi)"  <<??     ROS As per HPI  PE: There were no vitals taken for this visit. ***  LABS:    Chemistry      Component Value Date/Time   NA 141 03/05/2018 1131   NA 141 09/09/2017   K 5.7 (H) 03/05/2018 1131   CL 111 03/05/2018 1131   CO2 26 03/05/2018 1131   BUN 73 (H) 03/05/2018 1131   BUN 66 (A) 09/09/2017   CREATININE 2.20 (H) 03/05/2018 1131   CREATININE 2.67 (H) 02/05/2018 1438   GLU 105 09/09/2017      Component Value Date/Time   CALCIUM 9.4 03/05/2018 1131   ALKPHOS 98 02/12/2018 1535   AST 13 (L) 02/12/2018 1535   ALT 12 02/12/2018 1535   BILITOT 0.9 02/12/2018 1535     Lab Results  Component Value Date   WBC 6.0 02/13/2018   HGB 8.9 (L) 02/13/2018   HCT 28.6 (L) 02/13/2018   MCV 97.6 02/13/2018   PLT 184 02/13/2018    IMPRESSION AND PLAN:  No problem-specific Assessment & Plan notes found for this encounter.   An After Visit Summary was printed and given to the patient.  FOLLOW UP: No follow-ups on file.  Signed:  Crissie Sickles, MD           05/07/2018

## 2018-05-28 DIAGNOSIS — E875 Hyperkalemia: Secondary | ICD-10-CM | POA: Diagnosis not present

## 2018-05-28 DIAGNOSIS — I129 Hypertensive chronic kidney disease with stage 1 through stage 4 chronic kidney disease, or unspecified chronic kidney disease: Secondary | ICD-10-CM | POA: Diagnosis not present

## 2018-05-28 DIAGNOSIS — N184 Chronic kidney disease, stage 4 (severe): Secondary | ICD-10-CM | POA: Diagnosis not present

## 2018-05-28 DIAGNOSIS — N2581 Secondary hyperparathyroidism of renal origin: Secondary | ICD-10-CM | POA: Diagnosis not present

## 2018-05-28 DIAGNOSIS — N189 Chronic kidney disease, unspecified: Secondary | ICD-10-CM | POA: Diagnosis not present

## 2018-05-28 DIAGNOSIS — D631 Anemia in chronic kidney disease: Secondary | ICD-10-CM | POA: Diagnosis not present

## 2018-05-28 LAB — BASIC METABOLIC PANEL
BUN: 69 — AB (ref 4–21)
Creatinine: 1.9 — AB (ref 0.5–1.1)
GLUCOSE: 96
Potassium: 5.3 (ref 3.4–5.3)
Sodium: 145 (ref 137–147)

## 2018-05-28 LAB — HEPATIC FUNCTION PANEL
ALT: 10 (ref 7–35)
AST: 15 (ref 13–35)
Alkaline Phosphatase: 94 (ref 25–125)
BILIRUBIN, TOTAL: 0.2

## 2018-05-29 DIAGNOSIS — Z7409 Other reduced mobility: Secondary | ICD-10-CM | POA: Diagnosis not present

## 2018-05-29 DIAGNOSIS — R5381 Other malaise: Secondary | ICD-10-CM | POA: Diagnosis not present

## 2018-06-05 ENCOUNTER — Encounter (HOSPITAL_COMMUNITY): Payer: Medicare Other

## 2018-06-09 ENCOUNTER — Encounter: Payer: Self-pay | Admitting: Family Medicine

## 2018-06-09 ENCOUNTER — Encounter: Payer: Self-pay | Admitting: *Deleted

## 2018-06-09 ENCOUNTER — Ambulatory Visit (INDEPENDENT_AMBULATORY_CARE_PROVIDER_SITE_OTHER): Payer: Medicare Other | Admitting: Family Medicine

## 2018-06-09 VITALS — BP 110/62 | HR 56 | Temp 97.7°F | Resp 16 | Ht 67.0 in | Wt 110.5 lb

## 2018-06-09 DIAGNOSIS — E875 Hyperkalemia: Secondary | ICD-10-CM

## 2018-06-09 DIAGNOSIS — S91209A Unspecified open wound of unspecified toe(s) with damage to nail, initial encounter: Secondary | ICD-10-CM | POA: Diagnosis not present

## 2018-06-09 DIAGNOSIS — N184 Chronic kidney disease, stage 4 (severe): Secondary | ICD-10-CM

## 2018-06-09 DIAGNOSIS — I10 Essential (primary) hypertension: Secondary | ICD-10-CM | POA: Diagnosis not present

## 2018-06-09 DIAGNOSIS — R609 Edema, unspecified: Secondary | ICD-10-CM | POA: Diagnosis not present

## 2018-06-09 LAB — BASIC METABOLIC PANEL
BUN: 72 mg/dL — ABNORMAL HIGH (ref 6–23)
CALCIUM: 9.4 mg/dL (ref 8.4–10.5)
CO2: 20 mEq/L (ref 19–32)
CREATININE: 2.13 mg/dL — AB (ref 0.40–1.20)
Chloride: 114 mEq/L — ABNORMAL HIGH (ref 96–112)
GFR: 21.48 mL/min — AB (ref >60.00)
Glucose, Bld: 104 mg/dL — ABNORMAL HIGH (ref 70–99)
POTASSIUM: 5.7 meq/L — AB (ref 3.5–5.1)
Sodium: 143 mEq/L (ref 135–145)

## 2018-06-09 NOTE — Progress Notes (Signed)
OFFICE VISIT  06/09/2018   CC:  Chief Complaint  Patient presents with  . Follow-up    RCI   HPI:    Patient is a 85 y.o. Caucasian female with severe dementia who presents for 3 mo f/u HTN, peripheral edema, and CRI stage IV with hyperkalemia. Pt here alone, as usual. She says she dropped something on her toe recently, she is unsure when. Her R great toe has some gauze over it.  She says the area on R nail border is what hurts.  There is a bit of dried blood on the dressings.  Pt says this is the original dressing applied, no new application since it happened-->but she is not able to tell me when exactly it was applied. Other than the intermittent pain near medial nail border, she denies pain.  Swelling in legs is essentially unchanged-->she always has 4+ pitting edema in both LL's (the R calf down to/including foot and toes, and the L ankle down to and including toes).  No blood pressures to review [employer] records came w/out any BP's today).  It appears she is being given all meds as rx'd.  Past Medical History:  Diagnosis Date  . Allergic rhinitis, cause unspecified   . Anemia of chronic kidney failure, stage 4 (severe) (HCC)    Aranesp via nephrologist  . Cataracts, bilateral   . Cerebrovascular disease, unspecified    CDopplers 1/13 showed mild mixed plaque w/ 40-59% (low end) bilat ICA stenoses, the prev right CAE & patch angioplasty were patent  . Choledocholithiasis    12/11/14 CT--nonobstructive (80mm x 6 mm)  . Chronic renal insufficiency, stage IV (severe) (HCC)    Dr. Marval Regal. Not a candidate for renal replacement therapy.  Pt does not want this, either.  . Dementia with behavioral disturbance ([place])    depakote helpful  . Diaphragmatic hernia without mention of obstruction or gangrene   . Essential hypertension   . Hyperkalemia 06/12/2015   diminished renal excretion: as of 05/2016, kayexolate started, low K diet.  Kayexalate changed to veltassa 2019, veltassa  increased to 2 packets qd---potassium goal 5.7 or less.  . Impaired mobility    uses walker, wheelchair  . Pernicious anemia    + amemia of CRI  . Pure hypercholesterolemia   . RBBB   . Retinal hemorrhage of right eye   . Tibial fracture 12/2017   Left; Nondisplaced, spiral.  Initial radiographs NEG for fracture.  As of 04/22/19 ortho f/u-->fx healed.  PT recommended.  Marland Kitchen Unspecified cerebral artery occlusion with cerebral infarction    MRI Brain 12/12 showed large remote right hemispheric infarct w/ encephalomalacia w/ prominent small vessel dis superimposed on atrophy... (residual deficits are diminished sensation on left side, left hand grip weakness,   . Unspecified disorder resulting from impaired renal function    hyperkalemia and anemia  . Unspecified hearing loss   . Unspecified venous (peripheral) insufficiency    lasix  . Unspecified vitamin D deficiency     Past Surgical History:  Procedure Laterality Date  . CAROTID ENDARTERECTOMY    . CHOLECYSTECTOMY    . ENDOSCOPIC RETROGRADE CHOLANGIOPANCREATOGRAPHY (ERCP) WITH PROPOFOL N/A 03/09/2015   Procedure: ENDOSCOPIC RETROGRADE CHOLANGIOPANCREATOGRAPHY (ERCP) WITH PROPOFOL;  Surgeon: Milus Banister, MD;  Location: [facility];  Service: Endoscopy;  Laterality: N/A;  . laser surgery on right eye  12/02/2012   Dr. [facility]    Outpatient Medications Prior to Visit  Medication Sig Dispense Refill  . acetaminophen (  TYLENOL) 500 MG tablet Take 1,000 mg by mouth See admin instructions. Take 1,000 mg by mouth three times a day and an additional 1,000 mg every 8 hours as needed for pain    . cetirizine (ZYRTEC) 10 MG tablet Take 10 mg by mouth daily.    . clopidogrel (PLAVIX) 75 MG tablet Take 1 tablet (75 mg total) by mouth daily. RESTART THIS IN 3 DAYS (Patient taking differently: Take 75 mg by mouth daily. ) 30 tablet 3  . divalproex (DEPAKOTE SPRINKLE) 125 MG capsule 1 tab po qhs (Patient taking differently: Take  125 mg by mouth at bedtime. ) 30 capsule 0  . furosemide (LASIX) 40 MG tablet Take 40 mg by mouth daily.    . Multiple Vitamins-Iron (MULTIVITAMINS WITH IRON) TABS tablet Take 1 tablet by mouth daily.    . ondansetron (ZOFRAN) 4 MG tablet Take 4 mg by mouth every 12 (twelve) hours as needed for nausea.    Marland Kitchen oxybutynin (DITROPAN-XL) 5 MG 24 hr tablet Take 5 mg by mouth at bedtime.     . patiromer (VELTASSA) 8.4 g packet Take 1 packet (8.4 g total) by mouth daily. Do not give within 3 hours of taking any other medications.  May give with OR without food.    . ranitidine (ZANTAC) 75 MG tablet Take 75 mg by mouth daily.    Marland Kitchen senna-docusate (SENOKOT-S) 8.6-50 MG tablet Take 1 tablet by mouth 2 (two) times daily as needed for mild constipation or moderate constipation.    . traMADol (ULTRAM) 50 MG tablet Take 1 tablet (50 mg total) by mouth every 6 (six) hours as needed for severe pain. 6 tablet 0  . vitamin C (ASCORBIC ACID) 500 MG tablet Take 500 mg by mouth daily.     . cyanocobalamin (,VITAMIN B-12,) 1000 MCG/ML injection Inject 1,000 mcg into the skin every 30 (thirty) days. On the 12th of every month.     No facility-administered medications prior to visit.     Allergies  Allergen Reactions  . Procaine Hcl Shortness Of Breath, Palpitations and Other (See Comments)    "Reaction to shot at dentist office w/ tachycardia \\T \ SOB (? if really from combination w/epi)"  <<??     ROS As per HPI  PE: Blood pressure 110/62, pulse (!) 56, temperature 97.7 F (36.5 C), temperature source Oral, resp. rate 16, height 5\' 7"  (1.702 m), weight 110 lb 8 oz (50.1 kg), SpO2 97 %. Gen: alert, interactive, very HOH.  Pleasant.  Oriented to person, general place, general situation. CV: RRR, 2/6 syst murmur, no r/g. Chest is clear, no wheezing or rales. Normal symmetric air entry throughout both lung fields. No chest wall deformities or tenderness. EXT: no clubbing or cyanosis.  4+ pitting from top of calf  down into toes on L, and 4+ pitting from ankle down into toes on R.  R great toe with mild/mod generalized rubor, but this faded gradually after the gauze was taken off today in office. Mild TTP along nail border adjacent to 2nd toe.  No bleeding, no exudate.  There is a bit of dried blood on her dressings. No ecchymoses. She can flex/extend great toe w/out problem.  LABS:    Chemistry      Component Value Date/Time   NA 141 03/05/2018 1131   NA 141 09/09/2017   K 5.7 (H) 03/05/2018 1131   CL 111 03/05/2018 1131   CO2 26 03/05/2018 1131   BUN 73 (H) 03/05/2018 1131  BUN 66 (A) 09/09/2017   CREATININE 2.20 (H) 03/05/2018 1131   CREATININE 2.67 (H) 02/05/2018 1438   GLU 105 09/09/2017      Component Value Date/Time   CALCIUM 9.4 03/05/2018 1131   ALKPHOS 98 02/12/2018 1535   AST 13 (L) 02/12/2018 1535   ALT 12 02/12/2018 1535   BILITOT 0.9 02/12/2018 1535     Lab Results  Component Value Date   HGBA1C 5.1 08/21/2017   Lab Results  Component Value Date   WBC 6.0 02/13/2018   HGB 8.9 (L) 02/13/2018   HCT 28.6 (L) 02/13/2018   MCV 97.6 02/13/2018   PLT 184 02/13/2018    IMPRESSION AND PLAN:  1) R great toe contusion, with partial nail plate avulsion and tenderness. No sign of infection or fracture. Rewrapped R great toe with clean wet to dry gauze. Soak toe in epsom salt bath 20 min daily and I'll recheck it in 1 week.  2) Chronic bilat LE edema: stable.  Continue lasix 40mg  qd. BMET today.  3) HTN: stable. BMET today.  4) CRI IV, with hyperkalemia: continue 1 packet veltassa qd. BMET today. An After Visit Summary was printed and given to the patient.  FOLLOW UP: Return in about 1 week (around 06/16/2018) for recheck R big toe.  Signed:  Crissie Sickles, MD           06/09/2018

## 2018-06-10 ENCOUNTER — Other Ambulatory Visit (HOSPITAL_COMMUNITY): Payer: Self-pay | Admitting: *Deleted

## 2018-06-10 ENCOUNTER — Encounter: Payer: Self-pay | Admitting: *Deleted

## 2018-06-11 ENCOUNTER — Ambulatory Visit (HOSPITAL_COMMUNITY)
Admission: RE | Admit: 2018-06-11 | Discharge: 2018-06-11 | Disposition: A | Discharge status: Home / Self Care | Payer: Medicare Other | Source: Ambulatory Visit | Attending: Nephrology | Admitting: Nephrology

## 2018-06-11 DIAGNOSIS — N184 Chronic kidney disease, stage 4 (severe): Secondary | ICD-10-CM | POA: Insufficient documentation

## 2018-06-11 DIAGNOSIS — D631 Anemia in chronic kidney disease: Secondary | ICD-10-CM | POA: Insufficient documentation

## 2018-06-11 MED ORDER — SODIUM CHLORIDE 0.9 % IV SOLN
510.0000 mg | INTRAVENOUS | Status: DC
  Administered 2018-06-11: 510 mg via INTRAVENOUS
  Filled 2018-06-11: qty 510

## 2018-06-12 ENCOUNTER — Telehealth: Payer: Self-pay | Admitting: Family Medicine

## 2018-06-12 NOTE — Telephone Encounter (Signed)
Copied from [place]. Topic: General - Other >> Jun 12, 2018  3:39 PM Keene Breath wrote: Reason for CRM: Seth Bake with Hospice called to inform the doctor that the patient has been having loose stools at least 4x a week and the facility does not know what to give her.  Please prescribe something to help the condition.  CB# for Seth Bake is [phone]

## 2018-06-12 NOTE — Telephone Encounter (Signed)
Seth Bake advised and voiced understanding.

## 2018-06-12 NOTE — Telephone Encounter (Signed)
Do not give her anything for loose stools. She is supposed to have some loose stools b/c the medication to treat her high potassium Lazarus Salines) works by inhibiting absorption-->so the result is going to be some loose stools, unfortunately.

## 2018-06-12 NOTE — Telephone Encounter (Signed)
Please advise. Thanks.  

## 2018-06-18 ENCOUNTER — Encounter: Payer: Self-pay | Admitting: Family Medicine

## 2018-06-18 ENCOUNTER — Ambulatory Visit (INDEPENDENT_AMBULATORY_CARE_PROVIDER_SITE_OTHER): Payer: Medicare Other | Admitting: Family Medicine

## 2018-06-18 ENCOUNTER — Encounter: Payer: Self-pay | Admitting: *Deleted

## 2018-06-18 ENCOUNTER — Encounter (HOSPITAL_COMMUNITY)
Admission: RE | Admit: 2018-06-18 | Discharge: 2018-06-18 | Disposition: A | Discharge status: Home / Self Care | Payer: Medicare Other | Source: Ambulatory Visit | Attending: Nephrology | Admitting: Nephrology

## 2018-06-18 VITALS — BP 110/60 | HR 75 | Temp 97.3°F | Resp 16 | Ht 67.0 in | Wt 108.8 lb

## 2018-06-18 DIAGNOSIS — E875 Hyperkalemia: Secondary | ICD-10-CM

## 2018-06-18 DIAGNOSIS — N184 Chronic kidney disease, stage 4 (severe): Secondary | ICD-10-CM | POA: Diagnosis not present

## 2018-06-18 DIAGNOSIS — D631 Anemia in chronic kidney disease: Secondary | ICD-10-CM | POA: Insufficient documentation

## 2018-06-18 DIAGNOSIS — N189 Chronic kidney disease, unspecified: Secondary | ICD-10-CM | POA: Insufficient documentation

## 2018-06-18 DIAGNOSIS — R609 Edema, unspecified: Secondary | ICD-10-CM | POA: Diagnosis not present

## 2018-06-18 DIAGNOSIS — S91209D Unspecified open wound of unspecified toe(s) with damage to nail, subsequent encounter: Secondary | ICD-10-CM

## 2018-06-18 MED ORDER — SODIUM CHLORIDE 0.9 % IV SOLN
510.0000 mg | INTRAVENOUS | Status: DC
  Administered 2018-06-18: 510 mg via INTRAVENOUS
  Filled 2018-06-18: qty 510

## 2018-06-18 NOTE — Progress Notes (Signed)
OFFICE VISIT  06/18/2018   CC:  Chief Complaint  Patient presents with  . re-check big toe   HPI:    Patient is a 85 y.o. Caucasian female who presents for 1 wk recheck of R big toe contusion and subsequent mild partial nail plate avulsion. Recommended epsom salt soaks last visit, no new meds at that time-->monitor for s/s of infection.  No x-rays indicated. Her potassium and kidney function last week were stable.  She is feeling well. No pain in toe/foot.   Past Medical History:  Diagnosis Date  . Allergic rhinitis, cause unspecified   . Anemia of chronic kidney failure, stage 4 (severe) (HCC)    Aranesp via nephrologist  . Cataracts, bilateral   . Cerebrovascular disease, unspecified    CDopplers 1/13 showed mild mixed plaque w/ 40-59% (low end) bilat ICA stenoses, the prev right CAE & patch angioplasty were patent  . Choledocholithiasis    12/11/14 CT--nonobstructive (13mm x 6 mm)  . Chronic renal insufficiency, stage IV (severe) (HCC)    Dr. Marval Regal. Not a candidate for renal replacement therapy.  Pt does not want this, either.  . Dementia with behavioral disturbance ([place])    depakote helpful  . Diaphragmatic hernia without mention of obstruction or gangrene   . Essential hypertension   . Hyperkalemia 06/12/2015   diminished renal excretion: as of 05/2016, kayexolate started, low K diet.  Kayexalate changed to veltassa 2019, veltassa increased to 2 packets qd---potassium goal 5.7 or less.  . Impaired mobility    uses walker, wheelchair  . Pernicious anemia    + amemia of CRI  . Pure hypercholesterolemia   . RBBB   . Retinal hemorrhage of right eye   . Tibial fracture 12/2017   Left; Nondisplaced, spiral.  Initial radiographs NEG for fracture.  As of 04/22/19 ortho f/u-->fx healed.  PT recommended.  Marland Kitchen Unspecified cerebral artery occlusion with cerebral infarction    MRI Brain 12/12 showed large remote right hemispheric infarct w/ encephalomalacia w/ prominent small  vessel dis superimposed on atrophy... (residual deficits are diminished sensation on left side, left hand grip weakness,   . Unspecified disorder resulting from impaired renal function    hyperkalemia and anemia  . Unspecified hearing loss   . Unspecified venous (peripheral) insufficiency    lasix  . Unspecified vitamin D deficiency     Past Surgical History:  Procedure Laterality Date  . CAROTID ENDARTERECTOMY    . CHOLECYSTECTOMY    . ENDOSCOPIC RETROGRADE CHOLANGIOPANCREATOGRAPHY (ERCP) WITH PROPOFOL N/A 03/09/2015   Procedure: ENDOSCOPIC RETROGRADE CHOLANGIOPANCREATOGRAPHY (ERCP) WITH PROPOFOL;  Surgeon: Milus Banister, MD;  Location: [facility];  Service: Endoscopy;  Laterality: N/A;  . laser surgery on right eye  12/02/2012   Dr[facility]    Outpatient Medications Prior to Visit  Medication Sig Dispense Refill  . acetaminophen (TYLENOL) 500 MG tablet Take 1,000 mg by mouth See admin instructions. Take 1,000 mg by mouth three times a day and an additional 1,000 mg every 8 hours as needed for pain    . cetirizine (ZYRTEC) 10 MG tablet Take 10 mg by mouth daily.    . clopidogrel (PLAVIX) 75 MG tablet Take 1 tablet (75 mg total) by mouth daily. RESTART THIS IN 3 DAYS (Patient taking differently: Take 75 mg by mouth daily. ) 30 tablet 3  . divalproex (DEPAKOTE SPRINKLE) 125 MG capsule 1 tab po qhs (Patient taking differently: Take 125 mg by mouth at bedtime. ) 30  capsule 0  . furosemide (LASIX) 40 MG tablet Take 40 mg by mouth daily.    . Multiple Vitamins-Iron (MULTIVITAMINS WITH IRON) TABS tablet Take 1 tablet by mouth daily.    . ondansetron (ZOFRAN) 4 MG tablet Take 4 mg by mouth every 12 (twelve) hours as needed for nausea.    Marland Kitchen oxybutynin (DITROPAN-XL) 5 MG 24 hr tablet Take 5 mg by mouth at bedtime.     . patiromer (VELTASSA) 8.4 g packet Take 1 packet (8.4 g total) by mouth daily. Do not give within 3 hours of taking any other medications.  May give with OR  without food.    . ranitidine (ZANTAC) 75 MG tablet Take 75 mg by mouth daily.    Marland Kitchen senna-docusate (SENOKOT-S) 8.6-50 MG tablet Take 1 tablet by mouth 2 (two) times daily as needed for mild constipation or moderate constipation.    . traMADol (ULTRAM) 50 MG tablet Take 1 tablet (50 mg total) by mouth every 6 (six) hours as needed for severe pain. 6 tablet 0  . vitamin C (ASCORBIC ACID) 500 MG tablet Take 500 mg by mouth daily.     . cyanocobalamin (,VITAMIN B-12,) 1000 MCG/ML injection Inject 1,000 mcg into the skin every 30 (thirty) days. On the 12th of every month.     No facility-administered medications prior to visit.     Allergies  Allergen Reactions  . Procaine Hcl Shortness Of Breath, Palpitations and Other (See Comments)    "Reaction to shot at dentist office w/ tachycardia \\T \ SOB (? if really from combination w/epi)"  <<??     ROS As per HPI  PE: Blood pressure 110/60, pulse 75, temperature (!) 97.3 F (36.3 C), temperature source Oral, resp. rate 16, height 5\' 7"  (1.702 m), weight 108 lb 12.8 oz (49.4 kg), SpO2 99 %. Gen: Alert, well appearing.  Patient is oriented to person, place, time, and situation. SWN:IOEV: no injection, icteris, swelling, or exudate.  EOMI, PERRLA. Mouth: lips without lesion/swelling.  Oral mucosa pink and moist. Oropharynx without erythema, exudate, or swelling.  CV: RRR, 3/6 syst murmur-->stable. CTA bilat EXT: 4-5+ bilat LL pitting down into feet.  NO skin breakdown or weeping. R great toe nail w/out bleeding or loosening.  Very thickened nail.  NO blood.  No sign of infection.  LABS:    Chemistry      Component Value Date/Time   NA 143 06/09/2018 1021   NA 145 05/28/2018   K 5.7 (H) 06/09/2018 1021   CL 114 (H) 06/09/2018 1021   CO2 20 06/09/2018 1021   BUN 72 (H) 06/09/2018 1021   BUN 69 (A) 05/28/2018   CREATININE 2.13 (H) 06/09/2018 1021   CREATININE 2.67 (H) 02/05/2018 1438   GLU 96 05/28/2018      Component Value Date/Time    CALCIUM 9.4 06/09/2018 1021   ALKPHOS 94 05/28/2018   AST 15 05/28/2018   ALT 10 05/28/2018   BILITOT 0.9 02/12/2018 1535       IMPRESSION AND PLAN:  1) R big toe contusion, with partial nail plate avusion. This caused some pain and minimal bleeding and looks like it is healing well. No sign of fracture.   She can stop her soaks and stop covering the toe with dressings.  2) Chronic edema both LL's: pretty stable.  Adequate diuresis hindered by severe renal insufficiency. Continue lasix 40 mg qd.  3) CRI stage IV with chronic hyperkalemia: renal function and potassium stable on labs 9 days  ago. Continue 1 packet of veltassa qd.  An After Visit Summary was printed and given to the patient.  FOLLOW UP: Return in about 2 weeks (around 07/02/2018) for recheck R great toe and LE edema.  Signed:  Crissie Sickles, MD           06/18/2018

## 2018-06-27 DIAGNOSIS — Z7409 Other reduced mobility: Secondary | ICD-10-CM | POA: Diagnosis not present

## 2018-06-27 DIAGNOSIS — R5381 Other malaise: Secondary | ICD-10-CM | POA: Diagnosis not present

## 2018-07-02 ENCOUNTER — Encounter: Payer: Self-pay | Admitting: Family Medicine

## 2018-07-02 ENCOUNTER — Ambulatory Visit (INDEPENDENT_AMBULATORY_CARE_PROVIDER_SITE_OTHER): Payer: Medicare Other | Admitting: Family Medicine

## 2018-07-02 VITALS — BP 110/60 | HR 53 | Temp 97.4°F | Resp 16 | Ht 67.0 in | Wt 108.0 lb

## 2018-07-02 DIAGNOSIS — S90211D Contusion of right great toe with damage to nail, subsequent encounter: Secondary | ICD-10-CM

## 2018-07-02 DIAGNOSIS — R609 Edema, unspecified: Secondary | ICD-10-CM | POA: Diagnosis not present

## 2018-07-02 DIAGNOSIS — N184 Chronic kidney disease, stage 4 (severe): Secondary | ICD-10-CM | POA: Diagnosis not present

## 2018-07-02 DIAGNOSIS — E875 Hyperkalemia: Secondary | ICD-10-CM

## 2018-07-02 NOTE — Progress Notes (Signed)
OFFICE VISIT  07/02/2018   CC:  Chief Complaint  Patient presents with  . Follow-up    Great Right tow and LE edema     HPI:    Patient is a 85 y.o. Caucasian female who presents unaccompanied for 2 week f/u R big toe contusion w/mild partial nail plate avulsion.  This was improved at last check and I had her stop dressing the toe and stop soaks. Also f/u bilat LE edema, chronic, in the context of CRI stage IV and chronic hyperkalemia.  Last visit we kept her diuretic and veltassa dosing the same, recent lytes/cr just prior to that visit were stable.  She says she is doing good. Toe feeling better, hurts "sometimes" and this helps well. Says toe is "healing".  No n/v, no fevers, no pain in abd, no CP or SOB.  She receives hospice care for advanced dementia and CRI.   Past Medical History:  Diagnosis Date  . Allergic rhinitis, cause unspecified   . Anemia of chronic kidney failure, stage 4 (severe) (HCC)    Aranesp via nephrologist  . Cataracts, bilateral   . Cerebrovascular disease, unspecified    CDopplers 1/13 showed mild mixed plaque w/ 40-59% (low end) bilat ICA stenoses, the prev right CAE & patch angioplasty were patent  . Choledocholithiasis    12/11/14 CT--nonobstructive (43mm x 6 mm)  . Chronic renal insufficiency, stage IV (severe) (HCC)    Dr. Marval Regal. Not a candidate for renal replacement therapy.  Pt does not want this, either.  . Dementia with behavioral disturbance ([place])    depakote helpful  . Diaphragmatic hernia without mention of obstruction or gangrene   . Essential hypertension   . Hyperkalemia 06/12/2015   diminished renal excretion: as of 05/2016, kayexolate started, low K diet.  Kayexalate changed to veltassa 2019, veltassa increased to 2 packets qd---potassium goal 5.7 or less.  . Impaired mobility    uses walker, wheelchair  . Pernicious anemia    + amemia of CRI  . Pure hypercholesterolemia   . RBBB   . Retinal hemorrhage of right eye   .  Tibial fracture 12/2017   Left; Nondisplaced, spiral.  Initial radiographs NEG for fracture.  As of 04/22/19 ortho f/u-->fx healed.  PT recommended.  Marland Kitchen Unspecified cerebral artery occlusion with cerebral infarction    MRI Brain 12/12 showed large remote right hemispheric infarct w/ encephalomalacia w/ prominent small vessel dis superimposed on atrophy... (residual deficits are diminished sensation on left side, left hand grip weakness,   . Unspecified disorder resulting from impaired renal function    hyperkalemia and anemia  . Unspecified hearing loss   . Unspecified venous (peripheral) insufficiency    lasix  . Unspecified vitamin D deficiency     Past Surgical History:  Procedure Laterality Date  . CAROTID ENDARTERECTOMY    . CHOLECYSTECTOMY    . ENDOSCOPIC RETROGRADE CHOLANGIOPANCREATOGRAPHY (ERCP) WITH PROPOFOL N/A 03/09/2015   Procedure: ENDOSCOPIC RETROGRADE CHOLANGIOPANCREATOGRAPHY (ERCP) WITH PROPOFOL;  Surgeon: Milus Banister, MD;  Location: [facility];  Service: Endoscopy;  Laterality: N/A;  . laser surgery on right eye  12/02/2012   Dr. [facility]    Outpatient Medications Prior to Visit  Medication Sig Dispense Refill  . acetaminophen (TYLENOL) 500 MG tablet Take 1,000 mg by mouth See admin instructions. Take 1,000 mg by mouth three times a day and an additional 1,000 mg every 8 hours as needed for pain    . cetirizine (ZYRTEC) 10 MG  tablet Take 10 mg by mouth daily.    . clopidogrel (PLAVIX) 75 MG tablet Take 1 tablet (75 mg total) by mouth daily. RESTART THIS IN 3 DAYS (Patient taking differently: Take 75 mg by mouth daily. ) 30 tablet 3  . divalproex (DEPAKOTE SPRINKLE) 125 MG capsule 1 tab po qhs (Patient taking differently: Take 125 mg by mouth at bedtime. ) 30 capsule 0  . furosemide (LASIX) 40 MG tablet Take 40 mg by mouth daily.    . Multiple Vitamins-Iron (MULTIVITAMINS WITH IRON) TABS tablet Take 1 tablet by mouth daily.    . ondansetron (ZOFRAN)  4 MG tablet Take 4 mg by mouth every 12 (twelve) hours as needed for nausea.    Marland Kitchen oxybutynin (DITROPAN-XL) 5 MG 24 hr tablet Take 5 mg by mouth at bedtime.     . patiromer (VELTASSA) 8.4 g packet Take 1 packet (8.4 g total) by mouth daily. Do not give within 3 hours of taking any other medications.  May give with OR without food.    . ranitidine (ZANTAC) 75 MG tablet Take 75 mg by mouth daily.    Marland Kitchen senna-docusate (SENOKOT-S) 8.6-50 MG tablet Take 1 tablet by mouth 2 (two) times daily as needed for mild constipation or moderate constipation.    . traMADol (ULTRAM) 50 MG tablet Take 1 tablet (50 mg total) by mouth every 6 (six) hours as needed for severe pain. 6 tablet 0  . vitamin C (ASCORBIC ACID) 500 MG tablet Take 500 mg by mouth daily.     . cyanocobalamin (,VITAMIN B-12,) 1000 MCG/ML injection Inject 1,000 mcg into the skin every 30 (thirty) days. On the 12th of every month.     No facility-administered medications prior to visit.     Allergies  Allergen Reactions  . Procaine Hcl Shortness Of Breath, Palpitations and Other (See Comments)    "Reaction to shot at dentist office w/ tachycardia \\T \ SOB (? if really from combination w/epi)"  <<??     ROS As per HPI  PE: Blood pressure 110/60, pulse (!) 53, temperature (!) 97.4 F (36.3 C), temperature source Oral, resp. rate 16, height 5\' 7"  (1.702 m), weight 108 lb (49 kg), SpO2 96 %. Gen: alert, pleasant/interactive as per her usual. No icterus. MM moist. Abd: soft, NT/ND EXT: no clubbing or cyanosis.  2 + pitting edema on R starting at the ankle, 4-5+ pitting edema on L from mid tibia down to toes.  No weeping or skin breakdown.  No tenderness or erythema. R great toe without warmth, tenderness, or erythema.  Nail plate intact.    LABS:    Chemistry      Component Value Date/Time   NA 143 06/09/2018 1021   NA 145 05/28/2018   K 5.7 (H) 06/09/2018 1021   CL 114 (H) 06/09/2018 1021   CO2 20 06/09/2018 1021   BUN 72 (H)  06/09/2018 1021   BUN 69 (A) 05/28/2018   CREATININE 2.13 (H) 06/09/2018 1021   CREATININE 2.67 (H) 02/05/2018 1438   GLU 96 05/28/2018      Component Value Date/Time   CALCIUM 9.4 06/09/2018 1021   ALKPHOS 94 05/28/2018   AST 15 05/28/2018   ALT 10 05/28/2018   BILITOT 0.9 02/12/2018 1535      IMPRESSION AND PLAN:  1) Right great toe contusion, with partial nail plate avulsion: resolved.  2) Chronic bilat LE edema in the context of CRI stage IV. Stable.  No change in meds at  this time.  3) CRI IV, with chronic hyperkalemia: labs 3-4 wks ago stable. Continue veltassa 1 packet qd.  An After Visit Summary was printed and given to the patient.  FOLLOW UP: Return in about 3 months (around 10/02/2018) for routine chronic illness f/u.  Signed:  Crissie Sickles, MD           07/02/2018

## 2018-07-09 DIAGNOSIS — R2681 Unsteadiness on feet: Secondary | ICD-10-CM | POA: Diagnosis not present

## 2018-07-09 DIAGNOSIS — L603 Nail dystrophy: Secondary | ICD-10-CM | POA: Diagnosis not present

## 2018-07-09 DIAGNOSIS — M79673 Pain in unspecified foot: Secondary | ICD-10-CM | POA: Diagnosis not present

## 2018-07-09 DIAGNOSIS — B351 Tinea unguium: Secondary | ICD-10-CM | POA: Diagnosis not present

## 2018-07-28 DIAGNOSIS — R5381 Other malaise: Secondary | ICD-10-CM | POA: Diagnosis not present

## 2018-07-28 DIAGNOSIS — Z7409 Other reduced mobility: Secondary | ICD-10-CM | POA: Diagnosis not present

## 2018-08-03 ENCOUNTER — Telehealth: Payer: Self-pay

## 2018-08-03 NOTE — Telephone Encounter (Signed)
Received fax from [employer]. Given to provider to sign and review. Paper faxed to pt's pharmacy and living facility.

## 2018-08-15 IMAGING — CT CT HEAD W/O CM
3 series · 15 of 47 positions shown, 18 images · non-contrast
Comparison: 09/22/2014

CLINICAL DATA: Lethargic, altered mental status, hypertension

EXAM:
CT HEAD WITHOUT CONTRAST
TECHNIQUE: Contiguous axial images were obtained from the base of the skull
through the vertex without intravenous contrast.

[Series 3: head 5.0 h30s · axial · 0.38mm/px · z∈[-197,-52]mm · 9 of 35 slices shown, 12 images]
[im 3/35  brain]
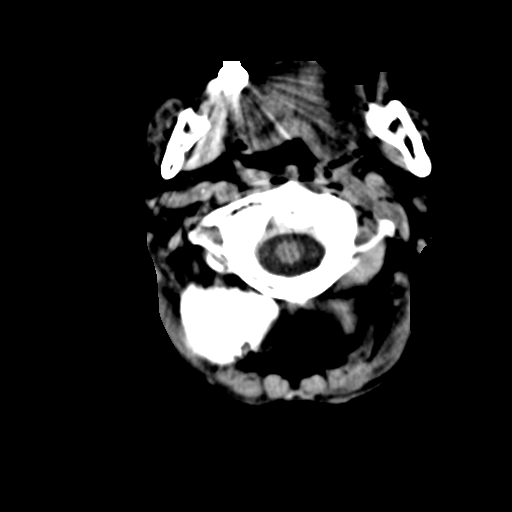
[im 3/35  bone]
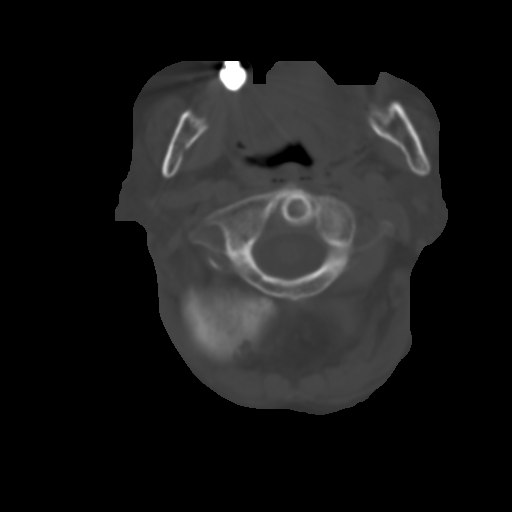
[im 6/35  brain]
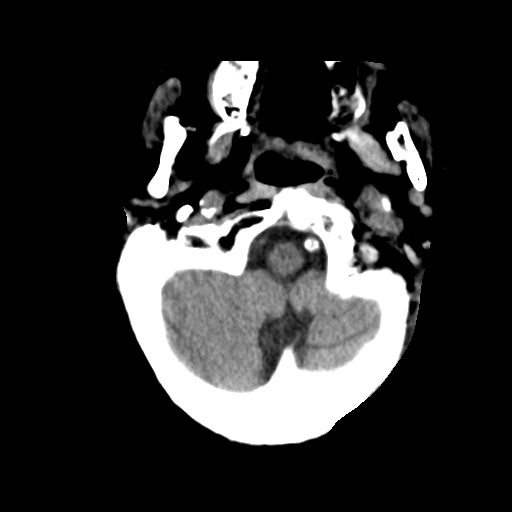
[im 10/35  brain]
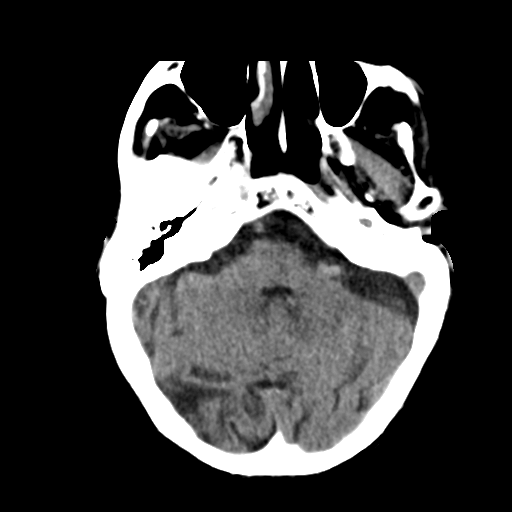
[im 13/35  brain]
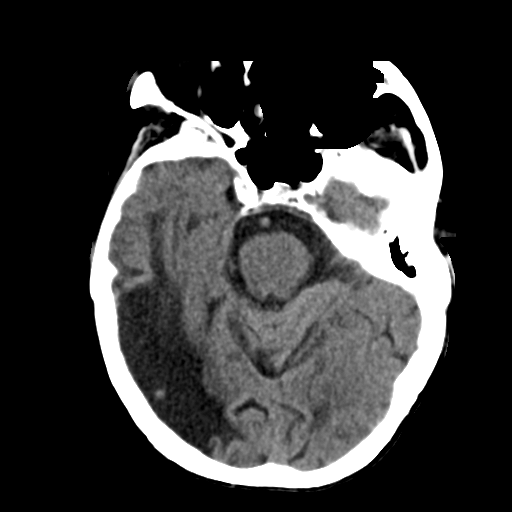
[im 18/35  brain]
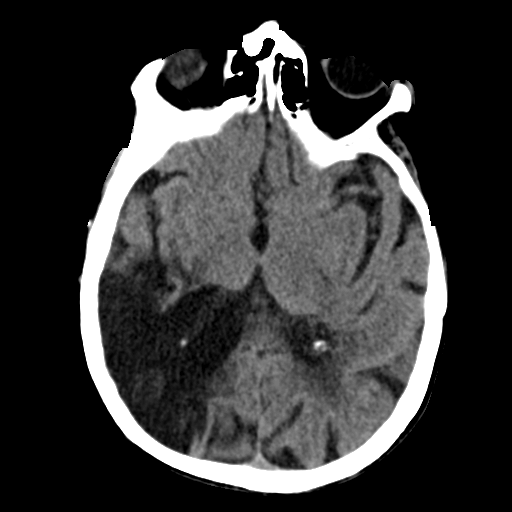
[im 18/35  bone]
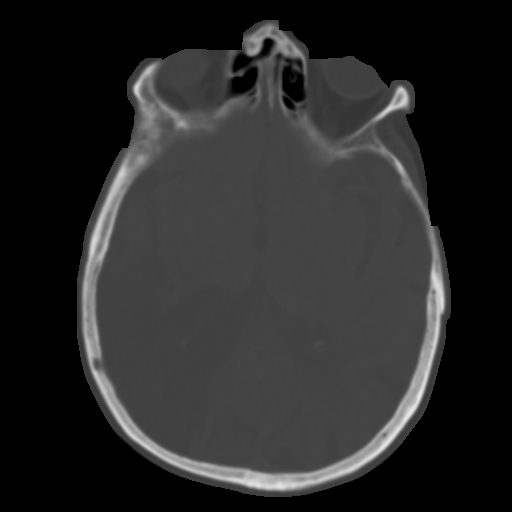
[im 22/35  brain]
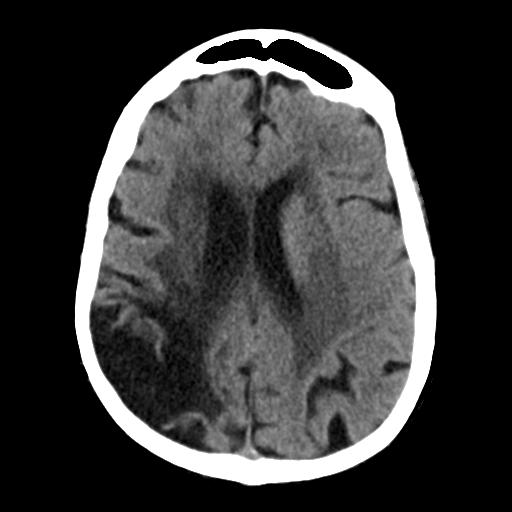
[im 25/35  brain]
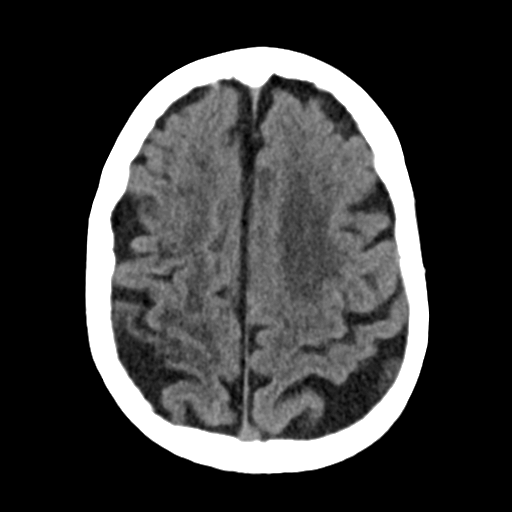
[im 29/35  brain]
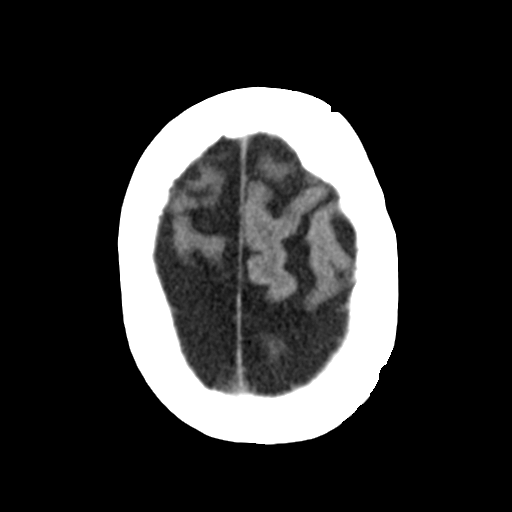
[im 32/35  brain]
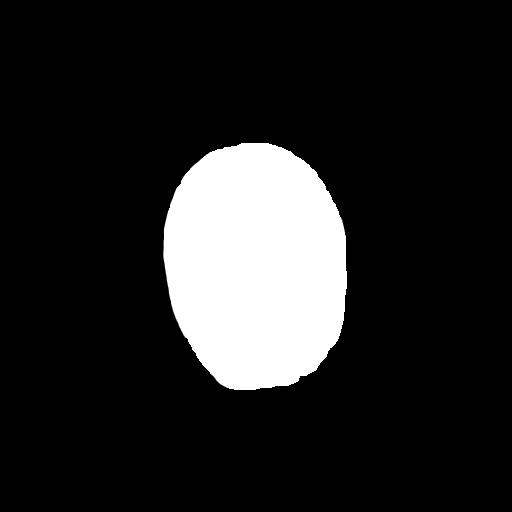
[im 32/35  bone]
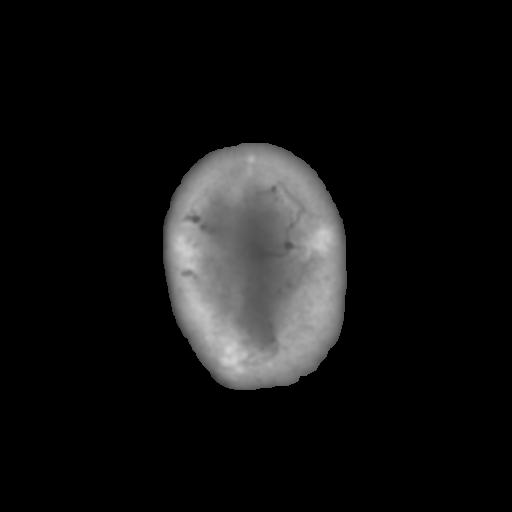

[Series 5: head 3.0 mpr cor · coronal · 0.33mm/px · 3 of 67 slices shown]
[im 23/67  brain]
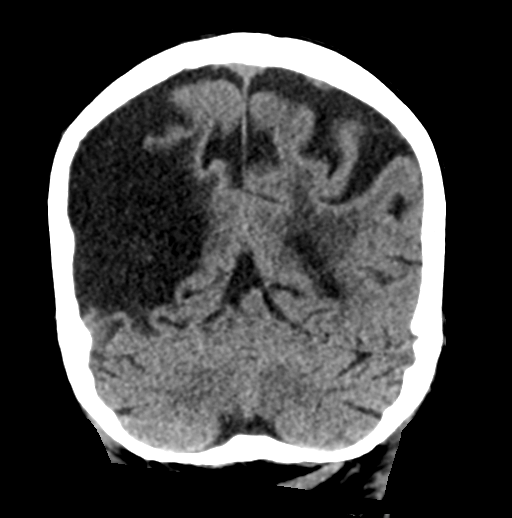
[im 30/67  brain]
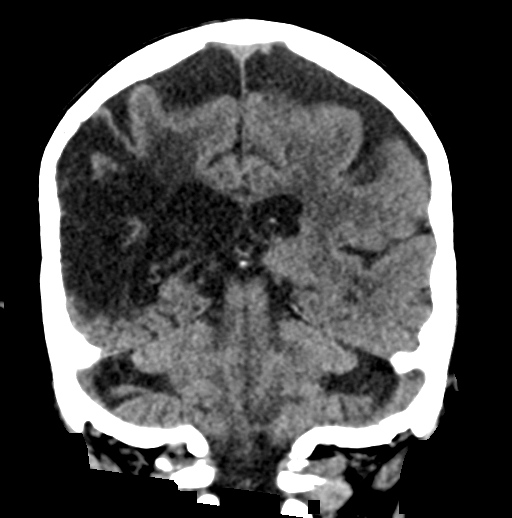
[im 37/67  brain]
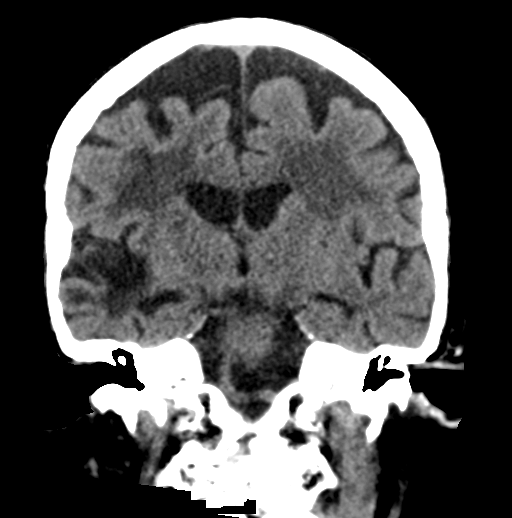

[Series 6: head 3.0 mpr sag · sagittal · 0.34mm/px · 3 of 52 slices shown]
[im 18/52  brain]
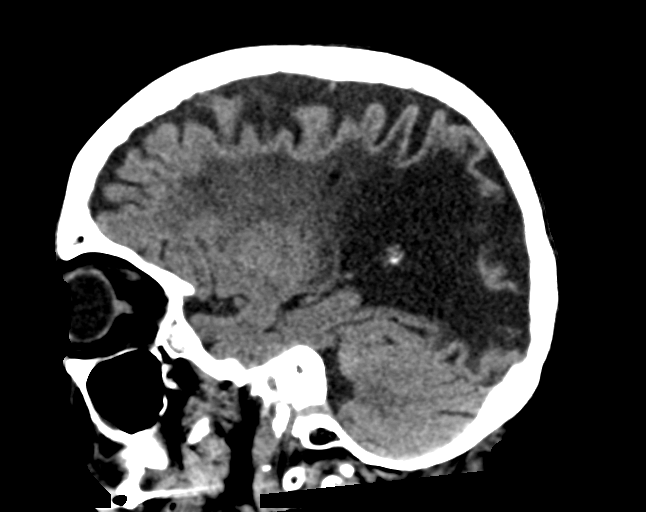
[im 26/52  brain]
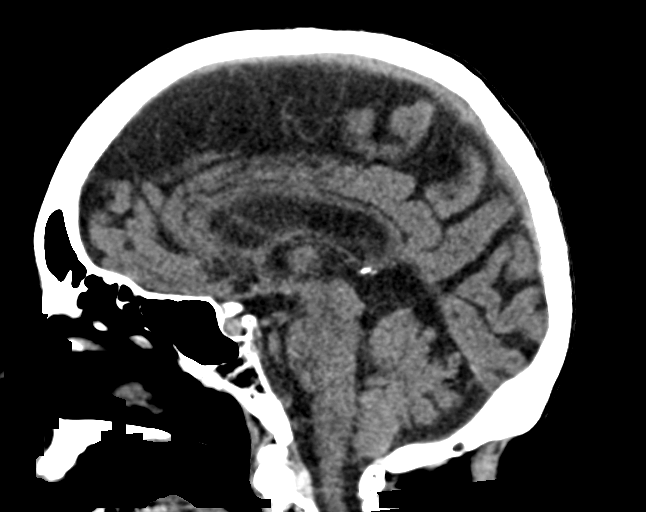
[im 34/52  brain]
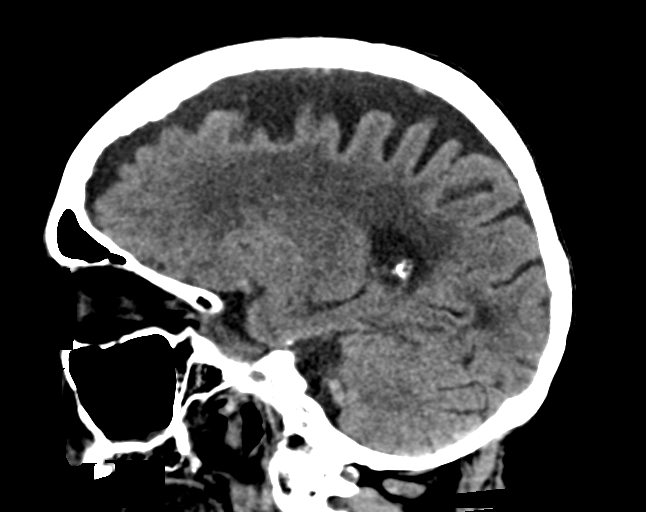

[15 of 47 positions shown; findings below may reference images not displayed]

FINDINGS: Brain: Stable cystic encephalomalacia in the right parietal temporal
lobe from a remote infarct. Extensive brain atrophy and chronic
white matter microvascular ischemic changes throughout both cerebral
hemispheres. No acute intracranial hemorrhage, mass lesion, new
infarction midline shift, herniation, hydrocephalus, or extra-axial
fluid collection. No focal mass effect or edema. Cisterns are
patent. Cerebellar atrophy as well.

Vascular: Intracranial atherosclerosis noted.  No hyperdense vessel.

Skull: Normal. Negative for fracture or focal lesion.

Sinuses/Orbits: Chronic mucosal thickening in the maxillary sinuses.
Other sinuses are clear. Orbits are symmetric.

Other: None.
IMPRESSION: Stable brain atrophy, chronic white matter microvascular changes,
and remote right cerebral infarct with encephalomalacia as above.

No significant interval change or acute process by noncontrast CT.

## 2018-08-27 DIAGNOSIS — R5381 Other malaise: Secondary | ICD-10-CM | POA: Diagnosis not present

## 2018-08-27 DIAGNOSIS — Z7409 Other reduced mobility: Secondary | ICD-10-CM | POA: Diagnosis not present

## 2018-09-10 ENCOUNTER — Telehealth: Payer: Self-pay

## 2018-09-10 NOTE — Telephone Encounter (Signed)
Received supplemental orders for pt. Placed on provider's desk to review and sign.

## 2018-09-11 NOTE — Telephone Encounter (Signed)
Completed.

## 2018-09-27 DIAGNOSIS — Z7409 Other reduced mobility: Secondary | ICD-10-CM | POA: Diagnosis not present

## 2018-09-27 DIAGNOSIS — R5381 Other malaise: Secondary | ICD-10-CM | POA: Diagnosis not present

## 2018-10-01 ENCOUNTER — Ambulatory Visit: Payer: Medicare Other | Admitting: Family Medicine

## 2018-10-13 ENCOUNTER — Telehealth: Payer: Self-pay

## 2018-10-13 NOTE — Telephone Encounter (Signed)
Received order summary report for pt. Given to PCP to review and sign.

## 2018-10-13 NOTE — Telephone Encounter (Signed)
signed

## 2018-11-05 ENCOUNTER — Telehealth: Payer: Self-pay

## 2018-11-05 NOTE — Telephone Encounter (Signed)
Received FL2 for patient. Given to PCP to review and sign.

## 2018-11-06 NOTE — Telephone Encounter (Signed)
Signed.

## 2018-11-27 DIAGNOSIS — Z7409 Other reduced mobility: Secondary | ICD-10-CM | POA: Diagnosis not present

## 2018-11-27 DIAGNOSIS — R5381 Other malaise: Secondary | ICD-10-CM | POA: Diagnosis not present

## 2018-12-17 ENCOUNTER — Telehealth: Payer: Self-pay

## 2018-12-17 NOTE — Telephone Encounter (Signed)
Received death certificate for pt, signature needed by PCP. Placed on your desk.

## 2018-12-17 NOTE — Telephone Encounter (Signed)
Signed and put in box to go up front.  

## 2018-12-29 ENCOUNTER — Telehealth: Payer: Self-pay

## 2018-12-29 NOTE — Telephone Encounter (Signed)
Received old home health orders that needed PCP to sign off on. PCP signed and forms placed in bin.

## 2018-12-29 DEATH — deceased

## 2018-12-30 IMAGING — DX DG ANKLE COMPLETE 3+V*L*
3 series · 3 of 3 positions shown · non-contrast
Comparison: 08/21/2009

CLINICAL DATA: Lower extremity swelling after sliding from a
wheelchair. Erythema.

EXAM:
LEFT ANKLE COMPLETE - 3+ VIEW

[ankle ap]
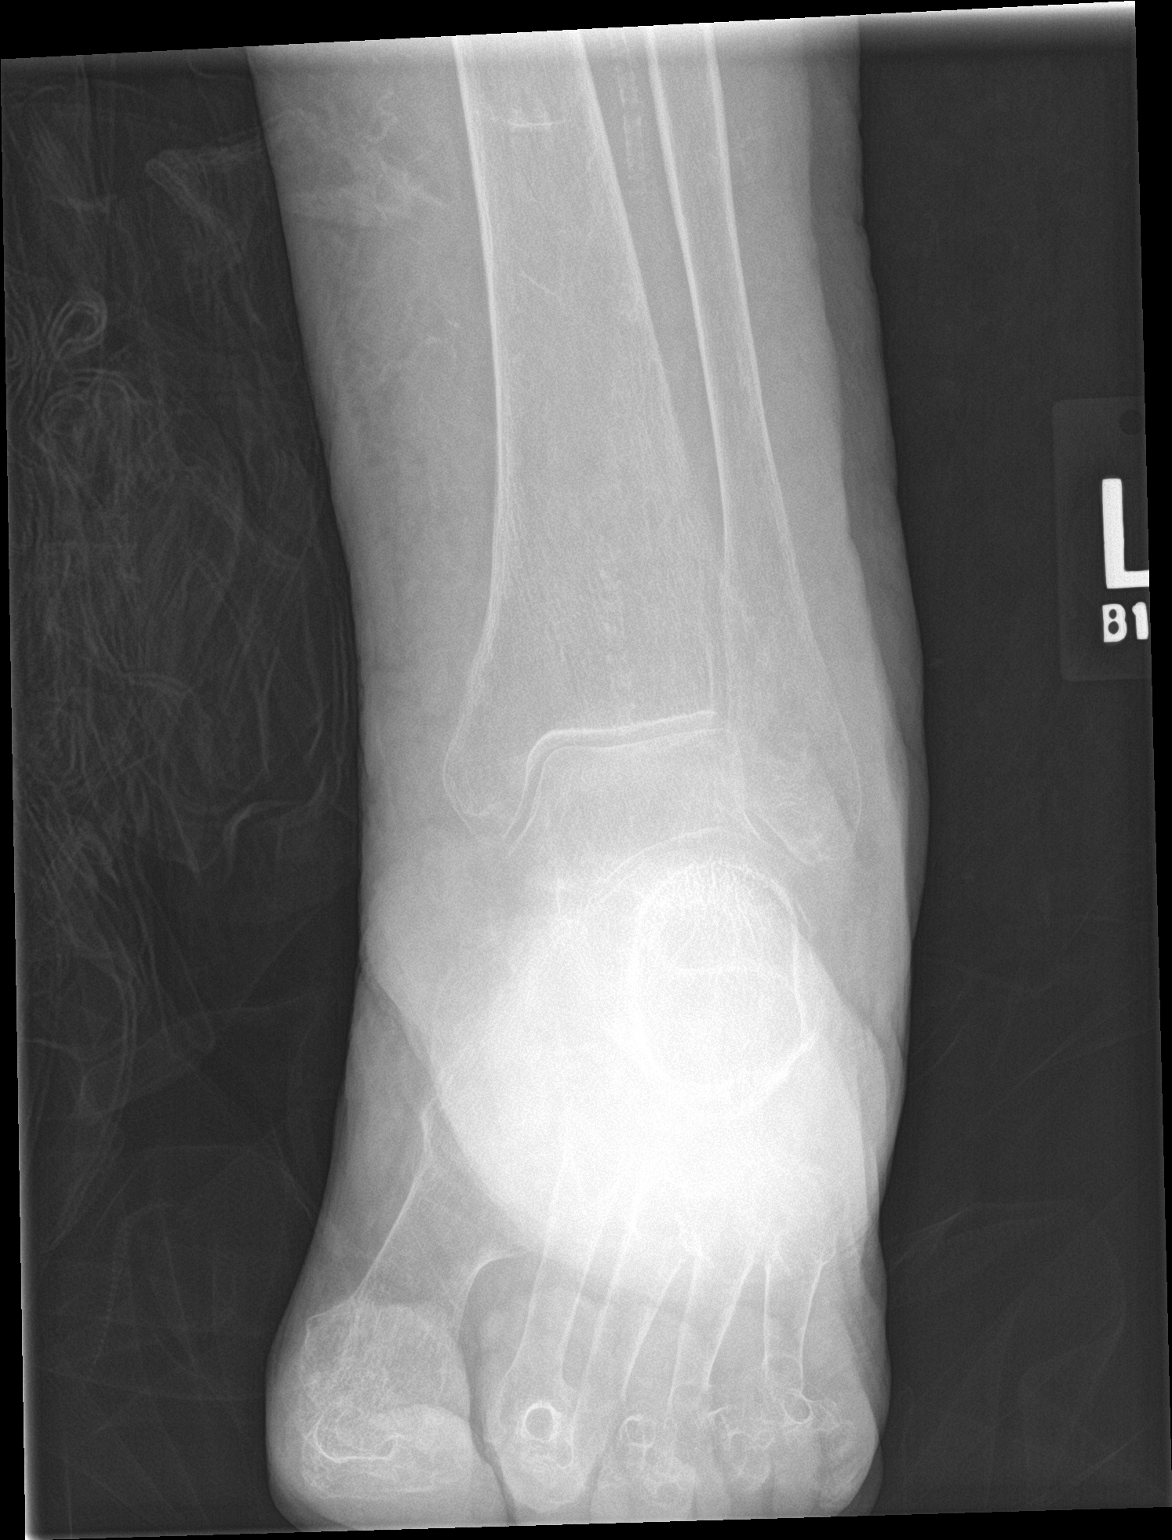

[ankle obl]
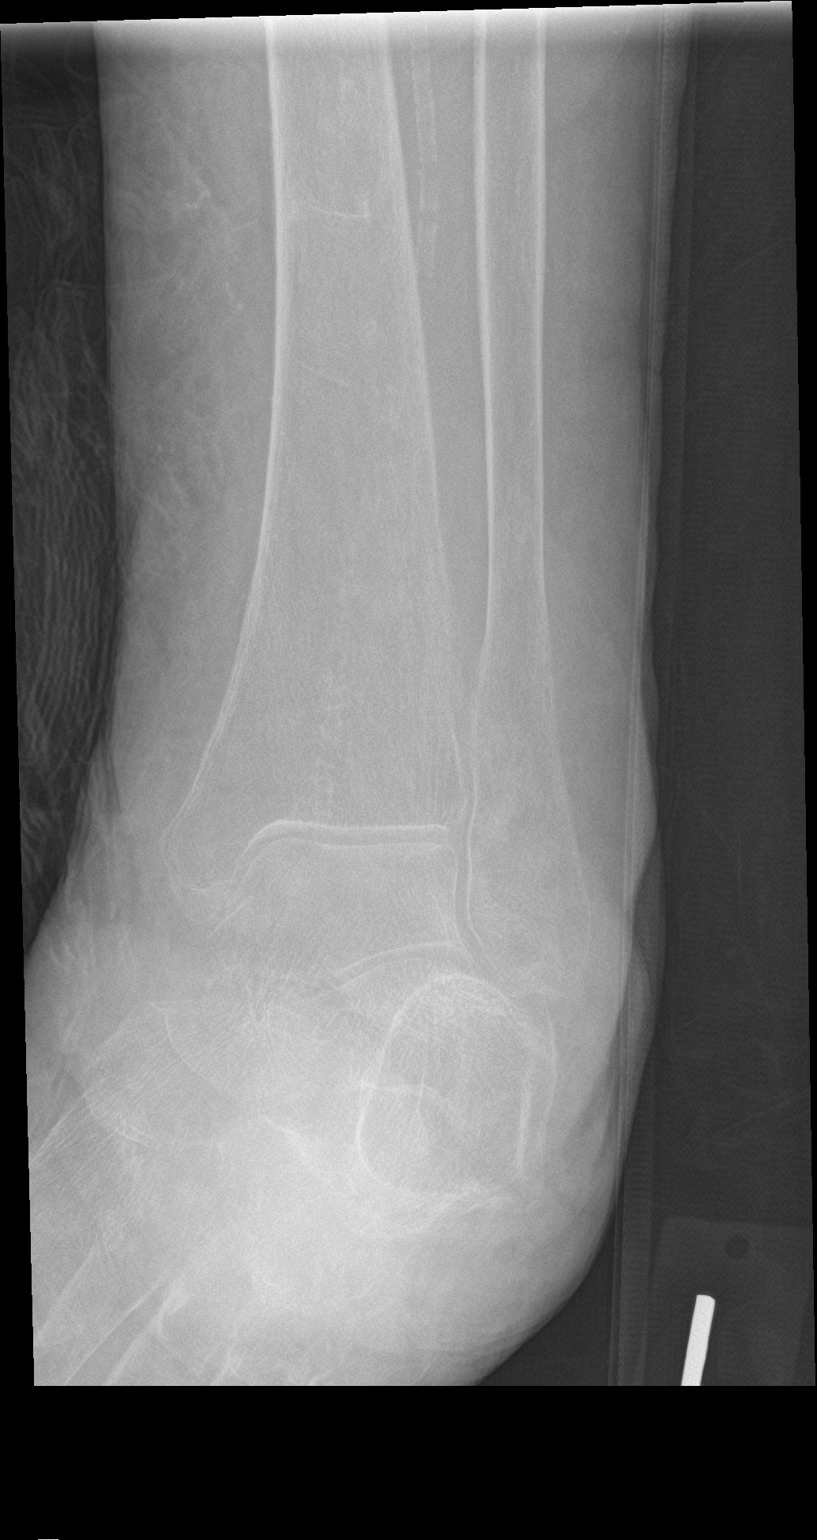

[ankle lat]
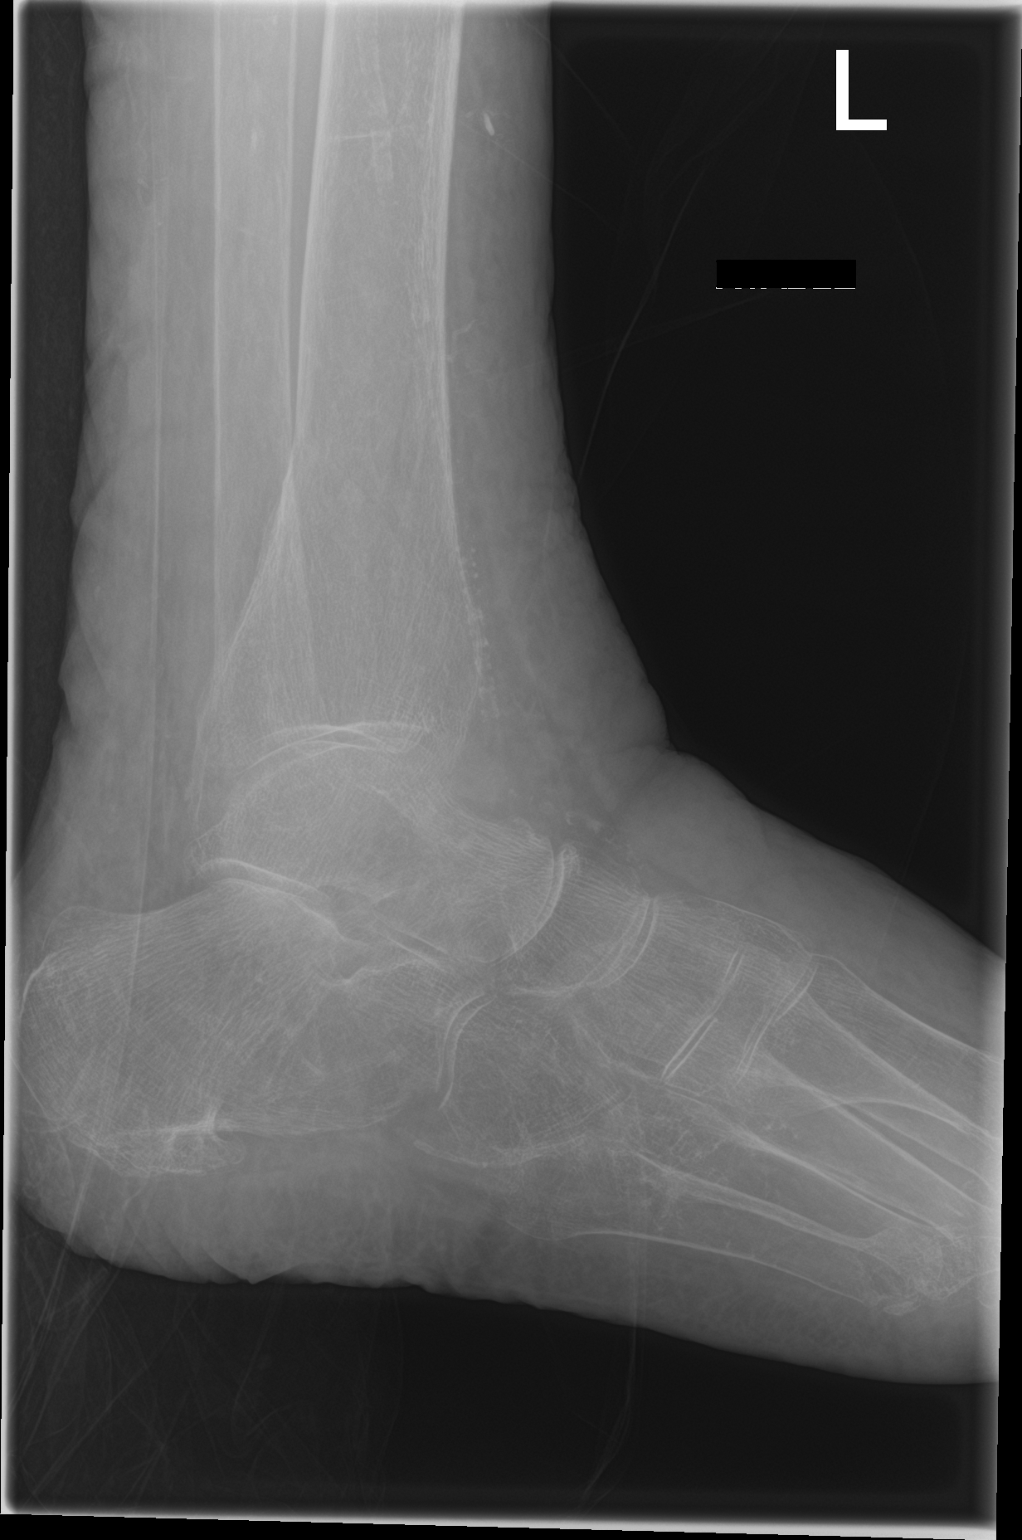

[3 of 3 positions shown; findings below may reference images not displayed]

FINDINGS: Diffuse subcutaneous edema along the distal calf and ankle.

Diffuse bony demineralization.

Distal calf atherosclerotic vascular calcifications.

Old deformity of the distal fifth metatarsal metaphysis favoring
healed fracture. Plantar calcaneal spur.

No malleolar fracture identified.
IMPRESSION: 1. Severe bony demineralization.
2. Diffuse subcutaneous edema in the calf and ankle.
3. Vascular calcifications noted.
4. Plantar calcaneal spur.
